# Patient Record
Sex: Male | Born: 1935 | Race: White | Hispanic: No | Marital: Married | Smoking: Never smoker
Health system: Southern US, Community
[De-identification: ages and names within clinical notes are randomized; demographics above are authoritative.]

## PROBLEM LIST (undated history)

## (undated) ENCOUNTER — Emergency Department (HOSPITAL_COMMUNITY): Discharge status: Against Medical Advice | Payer: Self-pay

## (undated) DIAGNOSIS — I700 Atherosclerosis of aorta: Secondary | ICD-10-CM

## (undated) DIAGNOSIS — T4145XA Adverse effect of unspecified anesthetic, initial encounter: Secondary | ICD-10-CM

## (undated) DIAGNOSIS — G8929 Other chronic pain: Secondary | ICD-10-CM

## (undated) DIAGNOSIS — R351 Nocturia: Secondary | ICD-10-CM

## (undated) DIAGNOSIS — I129 Hypertensive chronic kidney disease with stage 1 through stage 4 chronic kidney disease, or unspecified chronic kidney disease: Secondary | ICD-10-CM

## (undated) DIAGNOSIS — K219 Gastro-esophageal reflux disease without esophagitis: Secondary | ICD-10-CM

## (undated) DIAGNOSIS — I8390 Asymptomatic varicose veins of unspecified lower extremity: Secondary | ICD-10-CM

## (undated) DIAGNOSIS — D6859 Other primary thrombophilia: Secondary | ICD-10-CM

## (undated) DIAGNOSIS — M1990 Unspecified osteoarthritis, unspecified site: Secondary | ICD-10-CM

## (undated) DIAGNOSIS — D509 Iron deficiency anemia, unspecified: Secondary | ICD-10-CM

## (undated) DIAGNOSIS — K429 Umbilical hernia without obstruction or gangrene: Secondary | ICD-10-CM

## (undated) DIAGNOSIS — I10 Essential (primary) hypertension: Secondary | ICD-10-CM

## (undated) DIAGNOSIS — M51369 Other intervertebral disc degeneration, lumbar region without mention of lumbar back pain or lower extremity pain: Secondary | ICD-10-CM

## (undated) DIAGNOSIS — R112 Nausea with vomiting, unspecified: Secondary | ICD-10-CM

## (undated) DIAGNOSIS — I4891 Unspecified atrial fibrillation: Secondary | ICD-10-CM

## (undated) DIAGNOSIS — N400 Enlarged prostate without lower urinary tract symptoms: Secondary | ICD-10-CM

## (undated) DIAGNOSIS — R350 Frequency of micturition: Secondary | ICD-10-CM

## (undated) DIAGNOSIS — T7840XA Allergy, unspecified, initial encounter: Secondary | ICD-10-CM

## (undated) DIAGNOSIS — G5602 Carpal tunnel syndrome, left upper limb: Secondary | ICD-10-CM

## (undated) DIAGNOSIS — M5136 Other intervertebral disc degeneration, lumbar region: Secondary | ICD-10-CM

## (undated) DIAGNOSIS — M170 Bilateral primary osteoarthritis of knee: Secondary | ICD-10-CM

## (undated) DIAGNOSIS — R972 Elevated prostate specific antigen [PSA]: Secondary | ICD-10-CM

## (undated) DIAGNOSIS — M48061 Spinal stenosis, lumbar region without neurogenic claudication: Secondary | ICD-10-CM

## (undated) DIAGNOSIS — K4020 Bilateral inguinal hernia, without obstruction or gangrene, not specified as recurrent: Secondary | ICD-10-CM

## (undated) DIAGNOSIS — N401 Enlarged prostate with lower urinary tract symptoms: Secondary | ICD-10-CM

## (undated) DIAGNOSIS — E785 Hyperlipidemia, unspecified: Secondary | ICD-10-CM

## (undated) DIAGNOSIS — C801 Malignant (primary) neoplasm, unspecified: Secondary | ICD-10-CM

## (undated) DIAGNOSIS — M19049 Primary osteoarthritis, unspecified hand: Secondary | ICD-10-CM

## (undated) DIAGNOSIS — N529 Male erectile dysfunction, unspecified: Secondary | ICD-10-CM

## (undated) DIAGNOSIS — T8859XA Other complications of anesthesia, initial encounter: Secondary | ICD-10-CM

## (undated) DIAGNOSIS — N183 Chronic kidney disease, stage 3 unspecified: Secondary | ICD-10-CM

## (undated) DIAGNOSIS — Z9889 Other specified postprocedural states: Secondary | ICD-10-CM

## (undated) DIAGNOSIS — M1611 Unilateral primary osteoarthritis, right hip: Secondary | ICD-10-CM

## (undated) DIAGNOSIS — E119 Type 2 diabetes mellitus without complications: Secondary | ICD-10-CM

## (undated) DIAGNOSIS — N138 Other obstructive and reflux uropathy: Secondary | ICD-10-CM

## (undated) DIAGNOSIS — E1122 Type 2 diabetes mellitus with diabetic chronic kidney disease: Secondary | ICD-10-CM

## (undated) DIAGNOSIS — Z95818 Presence of other cardiac implants and grafts: Secondary | ICD-10-CM

## (undated) HISTORY — PX: TONSILLECTOMY: SUR1361

## (undated) HISTORY — DX: Other intervertebral disc degeneration, lumbar region without mention of lumbar back pain or lower extremity pain: M51.369

## (undated) HISTORY — DX: Allergy, unspecified, initial encounter: T78.40XA

## (undated) HISTORY — DX: Unspecified atrial fibrillation: I48.91

## (undated) HISTORY — DX: Essential (primary) hypertension: I10

## (undated) HISTORY — DX: Unilateral primary osteoarthritis, right hip: M16.11

## (undated) HISTORY — DX: Chronic kidney disease, stage 3 unspecified: N18.30

## (undated) HISTORY — DX: Frequency of micturition: R35.0

## (undated) HISTORY — DX: Hypertensive chronic kidney disease with stage 1 through stage 4 chronic kidney disease, or unspecified chronic kidney disease: I12.9

## (undated) HISTORY — PX: HEMORRHOID SURGERY: SHX153

## (undated) HISTORY — PX: COLONOSCOPY: SHX174

## (undated) HISTORY — DX: Other intervertebral disc degeneration, lumbar region: M51.36

## (undated) HISTORY — DX: Male erectile dysfunction, unspecified: N52.9

## (undated) HISTORY — DX: Carpal tunnel syndrome, left upper limb: G56.02

## (undated) HISTORY — PX: JOINT REPLACEMENT: SHX530

## (undated) HISTORY — DX: Benign prostatic hyperplasia without lower urinary tract symptoms: N40.0

## (undated) HISTORY — PX: UPPER GI ENDOSCOPY: SHX6162

## (undated) HISTORY — DX: Nocturia: R35.1

## (undated) HISTORY — DX: Bilateral primary osteoarthritis of knee: M17.0

## (undated) HISTORY — DX: Type 2 diabetes mellitus with diabetic chronic kidney disease: E11.22

## (undated) HISTORY — DX: Presence of other cardiac implants and grafts: Z95.818

## (undated) HISTORY — DX: Primary osteoarthritis, unspecified hand: M19.049

## (undated) HISTORY — DX: Type 2 diabetes mellitus without complications: E11.9

---
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
## 1996-01-07 HISTORY — PX: ANKLE ARTHROSCOPY WITH OPEN REDUCTION INTERNAL FIXATION (ORIF): SHX5582

## 2004-01-07 HISTORY — PX: KNEE ARTHROSCOPY: SHX127

## 2004-03-08 ENCOUNTER — Ambulatory Visit: Payer: Self-pay | Admitting: Urology

## 2009-02-06 ENCOUNTER — Ambulatory Visit: Payer: Self-pay | Admitting: Gastroenterology

## 2009-02-27 ENCOUNTER — Ambulatory Visit: Payer: Self-pay | Admitting: Gastroenterology

## 2009-03-22 ENCOUNTER — Ambulatory Visit: Payer: Self-pay | Admitting: Gastroenterology

## 2009-04-16 LAB — HM COLONOSCOPY

## 2009-08-20 ENCOUNTER — Encounter: Payer: Self-pay | Admitting: Cardiovascular Disease

## 2009-08-24 ENCOUNTER — Ambulatory Visit: Payer: Self-pay | Admitting: Cardiovascular Disease

## 2009-08-24 DIAGNOSIS — R609 Edema, unspecified: Secondary | ICD-10-CM | POA: Insufficient documentation

## 2009-10-19 ENCOUNTER — Telehealth: Payer: Self-pay | Admitting: Cardiovascular Disease

## 2009-10-19 ENCOUNTER — Emergency Department: Payer: Self-pay | Admitting: Emergency Medicine

## 2010-01-06 HISTORY — PX: UMBILICAL HERNIA REPAIR: SHX196

## 2010-01-06 HISTORY — PX: HERNIA REPAIR: SHX51

## 2010-01-06 HISTORY — PX: INGUINAL HERNIA REPAIR: SUR1180

## 2010-02-05 NOTE — Letter (Signed)
Summary: Imprimis  Imprimis   Imported By: Harlon Flor 08/22/2009 14:37:20  _____________________________________________________________________  External Attachment:    Type:   Image     Comment:   External Document

## 2010-02-05 NOTE — Assessment & Plan Note (Signed)
Summary: NP6/AMD   Visit Type:  Initial Consult Referring Peace Noyes:  Dr. Achilles Dunk Primary Phoua Hoadley:  Barry Brunner, M.D.  CC:  c/o ankles swelling after a 31 day bus trip. He has noticed since stopped the amlodipine  x 4 days has no swelling in ankles..  History of Present Illness: 76 year old gentleman, patient of Dr. Beckey Downing, recently seen and referred by Dr. Achilles Dunk, with a history of hypertension, presents for evaluation of lower extremity edema.  He reports that he has been feeling well. He has some polyuria at night time. He has recently repleted a 30 day bus trip to [place]. Prior to this he started noting lower extreme edema. The edema got worse through the bus trip. Since his return home, he has tried a low-dose diuretic with mild improvement. More recently, he held his amlodipine over the past 4 days with significant improvement of his edema. He reports that his blood pressure has been running in the low 140s though he has checked it sparingly.  He is active, walks one and 1/2 miles daily with no chest pain, shortness of breath. He has been on a cholesterol medicine and aspirin for quite some time.  EKG shows normal sinus rhythm with rate of 78 beats per minute, no significant ST or T wave changes.  Current Medications (verified): 1)  Cialis 20 Mg Tabs (Tadalafil) .... One Tablet Once Daily 2)  Avodart 0.5 Mg Caps (Dutasteride) .... One Tablet Once Daily 3)  Prilosec 20 Mg Cpdr (Omeprazole) .... One Tablet Once Daily 4)  Aspirin 81 Mg Tbec (Aspirin) .... One Tablet Once Daily 5)  Centrum Silver  Tabs (Multiple Vitamins-Minerals) .... One Tablet Once Daily 6)  Simvastatin 40 Mg Tabs (Simvastatin) .... One Tablet At Bedtime 7)  Benazepril Hcl 20 Mg Tabs (Benazepril Hcl) .... One Tablet Once Daily 8)  Aleve 220 Mg Tabs (Naproxen Sodium) .... 2-4  Tablets Once Daily  Allergies (verified): 1)  ! 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  Drug Use - no Caffeine in moderation   Risk Factors: Smoking Status: never (08/24/2009)  Review of Systems       The patient complains of peripheral edema.  The patient denies fever, weight loss, weight gain, vision loss, decreased hearing, hoarseness, chest pain, syncope, dyspnea on exertion, prolonged cough, abdominal pain, incontinence, muscle weakness, depression, and enlarged lymph nodes.         Edema has essentially resolved in past 4 days  Vital Signs:  Patient profile:   76 year old male Height:      74 inches Weight:      218 pounds BMI:     28.09 Pulse rate:   80 / minute BP sitting:   166 / 93  (left arm) Cuff size:   regular  Vitals Entered By: Bishop Dublin, CMA (August 24, 2009 11:22 AM)  Physical Exam  General:  Well developed, well nourished, in no acute distress. Head:  normocephalic and atraumatic Neck:  Neck supple, no JVD. No masses, thyromegaly or abnormal cervical nodes. Lungs:  Clear bilaterally to auscultation and percussion. Heart:  Non-displaced PMI, chest non-tender; regular rate and rhythm, S1, S2 without murmurs, rubs or gallops. Carotid upstroke normal, no bruit. Normal abdominal aortic size, no bruits. Pedals normal pulses. Trace edema  of the right LE, no varicosities. Abdomen:   abdomen soft and non-tender without masses Msk:  Back normal, normal gait. Muscle strength and tone normal. Pulses:  pulses normal in all 4 extremities Extremities:  No clubbing or cyanosis. Neurologic:  Alert and oriented x 3.   Impression & Recommendations:  Problem # 1:  EDEMA (ICD-782.3) edema has resolved after discontinuation of his calcium channel blocker, amlodipine. We have suggested that he monitor his blood pressure. If his systolic  pressure maintains greater than 140, systolic pressure more than 90, We have suggested that he retry amlodipine 5 mg daily. If he has recurrent edema, we will change this to an alternate medication such as HCTZ. I've asked him to call Dr. Georgian Co or myself if needed for blood pressure management.  I have suggested to him that if he has symptoms of shortness of breath, chest pressure, lethargy or change in his ability to exert himself, that he contact me for further evaluation.  Problem # 2:  PREVENTIVE HEALTH CARE (ICD-V70.0) He is on aspirin, statin, nonsmoker, borderline diabetes who exercises frequently. No other lifestyle management issues are needed apart from continued exercise and a  small amount of weight loss.  Other Orders: EKG w/ Interpretation (93000)  Patient Instructions: 1)  Your physician recommends that you schedule a follow-up appointment in: as needed  2)  Your physician has requested that you regularly monitor and record your blood pressure readings at home.  Please use the same machine at the same time of day to check your readings and record them to bring to your follow-up visit.

## 2010-02-05 NOTE — Letter (Signed)
Summary: PHI  PHI   Imported By: Harlon Flor 08/27/2009 09:40:37  _____________________________________________________________________  External Attachment:    Type:   Image     Comment:   External Document

## 2010-02-05 NOTE — Progress Notes (Signed)
Summary: Blood Pressure  Phone Note Call from Patient Call back at Home Phone [phone]   Caller: Spouse Call For: [place] Summary of Call: Patient's wife called and his blood pressure had been running very high for him today, 183/102.  His wife stated, "he just doesn't feel right" and has been disoriented.  Would like call back asap Initial call taken by: [place],  October 19, 2009 12:58 PM  Follow-up for Phone Call        Notified patient at 1:13 to see what is going on.  Patient took BP again at 1:13 reading of 184/104 pulse 87.   He then tells me he took another Benazepril tablet 30 minutes ago and now BP 179/98. He still feels disoriented and has headache.  I suggested since he lives in [place] to have someone drive him to the [facility] urgent care.  He states will do so now. Follow-up by: Bishop Dublin, CMA,  October 19, 2009 1:24 PM     Appended Document: Blood Pressure Can we call to see how his BP is running and what urgent care did for him/  Appended Document: Blood Pressure Pt sent to ER from urgent care.  Ran some tests and sent him home. Pt f/u with Dr. Letta Kocher who started him on losartan hctz 100/25 and increased his benazapril 40mg  daily. Today his BP was 147/91. Per Dr. Letta Kocher if pt's Bp continues to be elevated he is going to f/u with Dr. Mariah Milling. Pt recently went on a cruise and ER told him that his HTN maybe due to his scopolamine use, he reported wearing 4-5 patches at one time.

## 2010-03-26 ENCOUNTER — Ambulatory Visit: Payer: Self-pay | Admitting: Surgery

## 2010-04-01 ENCOUNTER — Ambulatory Visit: Payer: Self-pay | Admitting: Surgery

## 2010-07-15 ENCOUNTER — Encounter: Payer: Self-pay | Admitting: Cardiovascular Disease

## 2011-01-07 HISTORY — PX: THUMB ARTHROSCOPY: SHX2509

## 2011-01-28 ENCOUNTER — Ambulatory Visit: Payer: Self-pay | Admitting: Unknown Physician Specialty

## 2011-01-28 LAB — CBC
HCT: 42.8 % (ref 40.0–52.0)
HGB: 14.8 g/dL (ref 13.0–18.0)
MCH: 33.2 pg (ref 26.0–34.0)
MCHC: 34.5 g/dL (ref 32.0–36.0)
MCV: 96 fL (ref 80–100)
Platelet: 188 10*3/uL (ref 150–440)
RBC: 4.46 10*6/uL (ref 4.40–5.90)
WBC: 6.3 10*3/uL (ref 3.8–10.6)

## 2011-01-28 LAB — URINALYSIS, COMPLETE
Bacteria: NONE SEEN
Bilirubin,UR: NEGATIVE
Blood: NEGATIVE
Glucose,UR: NEGATIVE mg/dL (ref 0–75)
Ketone: NEGATIVE
Leukocyte Esterase: NEGATIVE
Ph: 5 (ref 4.5–8.0)
Protein: NEGATIVE
RBC,UR: 1 /HPF (ref 0–5)
Squamous Epithelial: NONE SEEN

## 2011-01-28 LAB — POTASSIUM: Potassium: 4.2 mmol/L (ref 3.5–5.1)

## 2011-02-03 ENCOUNTER — Ambulatory Visit: Payer: Self-pay | Admitting: Unknown Physician Specialty

## 2011-03-26 ENCOUNTER — Ambulatory Visit: Payer: Self-pay | Admitting: Unknown Physician Specialty

## 2011-09-02 ENCOUNTER — Emergency Department: Payer: Self-pay | Admitting: *Deleted

## 2011-09-11 DIAGNOSIS — N401 Enlarged prostate with lower urinary tract symptoms: Secondary | ICD-10-CM | POA: Insufficient documentation

## 2011-09-11 DIAGNOSIS — N138 Other obstructive and reflux uropathy: Secondary | ICD-10-CM | POA: Insufficient documentation

## 2012-09-15 DIAGNOSIS — E1122 Type 2 diabetes mellitus with diabetic chronic kidney disease: Secondary | ICD-10-CM | POA: Insufficient documentation

## 2012-09-15 DIAGNOSIS — I129 Hypertensive chronic kidney disease with stage 1 through stage 4 chronic kidney disease, or unspecified chronic kidney disease: Secondary | ICD-10-CM | POA: Insufficient documentation

## 2013-06-14 ENCOUNTER — Ambulatory Visit: Payer: Self-pay | Admitting: Internal Medicine

## 2013-06-23 ENCOUNTER — Ambulatory Visit: Payer: Self-pay | Admitting: Internal Medicine

## 2013-07-06 ENCOUNTER — Ambulatory Visit: Payer: Self-pay | Admitting: Internal Medicine

## 2013-07-22 ENCOUNTER — Ambulatory Visit: Payer: Self-pay | Admitting: Family Medicine

## 2013-08-06 ENCOUNTER — Ambulatory Visit: Payer: Self-pay | Admitting: Internal Medicine

## 2013-09-06 ENCOUNTER — Ambulatory Visit: Payer: Self-pay | Admitting: Internal Medicine

## 2013-11-03 DIAGNOSIS — M170 Bilateral primary osteoarthritis of knee: Secondary | ICD-10-CM | POA: Insufficient documentation

## 2014-02-27 LAB — LIPID PANEL
Cholesterol: 188 mg/dL (ref 0–200)
HDL: 28 mg/dL — AB (ref 35–70)
TRIGLYCERIDES: 440 mg/dL — AB (ref 40–160)

## 2014-02-27 LAB — HEMOGLOBIN A1C: Hgb A1c MFr Bld: 6.7 % — AB (ref 4.0–6.0)

## 2014-04-17 ENCOUNTER — Encounter: Payer: Self-pay | Admitting: Internal Medicine

## 2014-04-17 DIAGNOSIS — I8390 Asymptomatic varicose veins of unspecified lower extremity: Secondary | ICD-10-CM | POA: Insufficient documentation

## 2014-04-17 DIAGNOSIS — Z79899 Other long term (current) drug therapy: Secondary | ICD-10-CM | POA: Insufficient documentation

## 2014-04-17 DIAGNOSIS — N183 Chronic kidney disease, stage 3 unspecified: Secondary | ICD-10-CM | POA: Insufficient documentation

## 2014-04-17 DIAGNOSIS — M79672 Pain in left foot: Secondary | ICD-10-CM

## 2014-04-17 DIAGNOSIS — E119 Type 2 diabetes mellitus without complications: Secondary | ICD-10-CM | POA: Insufficient documentation

## 2014-04-17 DIAGNOSIS — E1169 Type 2 diabetes mellitus with other specified complication: Secondary | ICD-10-CM | POA: Insufficient documentation

## 2014-04-17 DIAGNOSIS — I10 Essential (primary) hypertension: Secondary | ICD-10-CM | POA: Insufficient documentation

## 2014-04-17 DIAGNOSIS — N529 Male erectile dysfunction, unspecified: Secondary | ICD-10-CM | POA: Insufficient documentation

## 2014-04-17 DIAGNOSIS — N401 Enlarged prostate with lower urinary tract symptoms: Secondary | ICD-10-CM | POA: Insufficient documentation

## 2014-04-17 DIAGNOSIS — N62 Hypertrophy of breast: Secondary | ICD-10-CM | POA: Insufficient documentation

## 2014-04-17 DIAGNOSIS — M62830 Muscle spasm of back: Secondary | ICD-10-CM | POA: Insufficient documentation

## 2014-04-17 DIAGNOSIS — R972 Elevated prostate specific antigen [PSA]: Secondary | ICD-10-CM | POA: Insufficient documentation

## 2014-04-17 DIAGNOSIS — K210 Gastro-esophageal reflux disease with esophagitis: Secondary | ICD-10-CM

## 2014-04-17 DIAGNOSIS — M19049 Primary osteoarthritis, unspecified hand: Secondary | ICD-10-CM | POA: Insufficient documentation

## 2014-04-17 DIAGNOSIS — E1122 Type 2 diabetes mellitus with diabetic chronic kidney disease: Secondary | ICD-10-CM | POA: Insufficient documentation

## 2014-04-17 DIAGNOSIS — E785 Hyperlipidemia, unspecified: Secondary | ICD-10-CM

## 2014-04-17 HISTORY — DX: Pain in left foot: M79.672

## 2014-07-28 ENCOUNTER — Encounter: Payer: Self-pay | Admitting: Internal Medicine

## 2014-07-28 ENCOUNTER — Ambulatory Visit (INDEPENDENT_AMBULATORY_CARE_PROVIDER_SITE_OTHER): Payer: Medicare Other | Admitting: Internal Medicine

## 2014-07-28 VITALS — BP 110/70 | HR 56 | Ht 74.0 in | Wt 198.6 lb

## 2014-07-28 DIAGNOSIS — Z0000 Encounter for general adult medical examination without abnormal findings: Secondary | ICD-10-CM

## 2014-07-28 DIAGNOSIS — E1121 Type 2 diabetes mellitus with diabetic nephropathy: Secondary | ICD-10-CM

## 2014-07-28 DIAGNOSIS — I10 Essential (primary) hypertension: Secondary | ICD-10-CM | POA: Diagnosis not present

## 2014-07-28 DIAGNOSIS — N183 Chronic kidney disease, stage 3 unspecified: Secondary | ICD-10-CM

## 2014-07-28 DIAGNOSIS — K2100 Gastro-esophageal reflux disease with esophagitis, without bleeding: Secondary | ICD-10-CM

## 2014-07-28 DIAGNOSIS — E785 Hyperlipidemia, unspecified: Secondary | ICD-10-CM

## 2014-07-28 LAB — POCT URINALYSIS DIPSTICK
Bilirubin, UA: NEGATIVE
GLUCOSE UA: NEGATIVE
Ketones, UA: NEGATIVE
Leukocytes, UA: NEGATIVE
Nitrite, UA: NEGATIVE
PROTEIN UA: NEGATIVE
RBC UA: NEGATIVE
Spec Grav, UA: 1.015
Urobilinogen, UA: 0.2
pH, UA: 5

## 2014-07-28 NOTE — Progress Notes (Signed)
Patient: Maxwell Stanley, Male    DOB: [birth year], 80 y.o.   MRN: [id number] Visit Date: 07/28/2014  Today's Provider: Halina Maidens, MD   Chief Complaint  Patient presents with  . Medicare Wellness  . Hypertension  . Hyperlipidemia   Subjective:    Annual wellness visit BOHDI LEEDS is a 80 y.o. male who presents today for his Subsequent Annual Wellness Visit. He feels fairly well. He reports exercising daily. He reports he is sleeping well. He is still dealing with elevated PSA and some LUTS - taking medication daily and followed by Dr. Jacqlyn Larsen - Urology.  ----------------------------------------------------------- Diabetes He presents for his follow-up diabetic visit. He has type 2 diabetes mellitus. His disease course has been stable. He is compliant with treatment all of the time. His breakfast blood glucose is taken between 7-8 am. His breakfast blood glucose range is generally 130-140 mg/dl.  Hypertension This is a chronic problem. The current episode started more than 1 year ago. The problem is unchanged. The problem is controlled. Risk factors for coronary artery disease include diabetes mellitus and dyslipidemia. Past treatments include beta blockers and diuretics. Identifiable causes of hypertension include chronic renal disease.  Hyperlipidemia This is a chronic problem. The problem is controlled. Recent lipid tests were reviewed and are normal. Exacerbating diseases include chronic renal disease and diabetes. Current antihyperlipidemic treatment includes statins.    Review of Systems  History   Social History  . Marital Status: Married    Spouse Name: N/A  . Number of Children: N/A  . Years of Education: N/A   Occupational History  . Not on file.   Social History Main Topics  . Smoking status: Never Smoker   . Smokeless tobacco: Not on file  . Alcohol Use: No  . Drug Use: No  . Sexual Activity: Not on file   Other Topics Concern  . Not on file    Social History Narrative   Caffeine in moderation    Patient Active Problem List   Diagnosis Date Noted  . Abnormal prostate specific antigen 04/17/2014  . Enlarged prostate with lower urinary tract symptoms (LUTS) 04/17/2014  . Impaired renal function 04/17/2014  . Dyslipidemia 04/17/2014  . 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 Essential (primary) hypertension 04/17/2014  . Esophagitis, reflux 04/17/2014  . Polypharmacy 04/17/2014  . Back muscle spasm 04/17/2014  . Degenerative arthritis of finger 04/17/2014  . Foot pain 04/17/2014  . Breast development in males 04/17/2014  . Diabetes mellitus, type 2 04/17/2014  . 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 Arthritis of knee, degenerative 11/03/2013  . Chronic kidney disease (CKD), stage III (moderate) 09/15/2012  . Benign prostatic hyperplasia with urinary obstruction 09/11/2011  . EDEMA 08/24/2009    Past Surgical History  Procedure Laterality Date  . Ankle arthroscopy with open reduction internal fixation (orif) Left 1998  . Hernia repair Left 2703    umbilical and left inguinal  . Knee arthroscopy Right 2006  . Thumb arthroscopy Right 2013    tendon flap to joint    His family history includes Coronary artery disease in his father; Diabetes in his mother; Hyperlipidemia in his father; Hypertension in his father.    Previous Medications   ACETAMINOPHEN (RA ACETAMINOPHEN) 650 MG CR TABLET    Take 2 tablets by mouth daily at 2 PM daily at 2 PM.   ASPIRIN 81 MG TABLET    Take 81 mg by mouth daily.     ATENOLOL (TENORMIN) 50 MG  TABLET    Take 1 tablet by mouth daily.   CHOLECALCIFEROL (VITAMIN D) 1000 UNITS TABLET    Take 1 tablet by mouth daily.   DOCUSATE SODIUM (COLACE) 100 MG CAPSULE    Take by mouth.   GLUCOSE BLOOD TEST STRIP    1 each 2 (two) times daily.   LOSARTAN-HYDROCHLOROTHIAZIDE (HYZAAR) 100-25 MG PER TABLET    Take 1 tablet by mouth daily.   MISC NATURAL PRODUCTS (OSTEO BI-FLEX TRIPLE STRENGTH)  TABS    Take 2 tablets by mouth daily.   MULTIPLE VITAMINS-MINERALS (CENTRUM SILVER) TABLET    Take 1 tablet by mouth daily.     OMEGA-3 FATTY ACIDS (FISH OIL) 1000 MG CAPS    Take 1 capsule by mouth daily at 2 PM daily at 2 PM.   PANTOPRAZOLE (PROTONIX) 40 MG TABLET    Take 1 tablet by mouth daily.   PROBIOTIC PRODUCT (PROBIOTIC & ACIDOPHILUS EX ST) CAPS    Take 1 capsule by mouth daily at 2 PM daily at 2 PM.   ROSUVASTATIN (CRESTOR) 40 MG TABLET    Take 1 tablet by mouth at bedtime.   SITAGLIPTIN (JANUVIA) 100 MG TABLET    Take 1 tablet by mouth daily.   TADALAFIL (CIALIS) 20 MG TABLET    Take 20 mg by mouth daily.     TAMSULOSIN (FLOMAX) 0.4 MG CAPS CAPSULE    Take 1 capsule by mouth daily at 2 PM daily at 2 PM.    Patient Care Team: Glean Hess, MD as PCP - General (Family Medicine)     Objective:   Vitals: BP 110/70 mmHg  Pulse 56  Ht 6\' 2"  (1.88 m)  Wt 198 lb 9.6 oz (90.084 kg)  BMI 25.49 kg/m2  Physical Exam  Activities of Daily Living In your present state of health, do you have any difficulty performing the following activities: 07/28/2014  Hearing? N  Vision? N  Difficulty concentrating or making decisions? N  Walking or climbing stairs? N  Dressing or bathing? N  Doing errands, shopping? N    Fall Risk Assessment Fall Risk  07/28/2014  Falls in the past year? No     Patient reports there are safety devices in place in shower at home.   Depression Screen PHQ 2/9 Scores 07/28/2014  PHQ - 2 Score 0    Cognitive Testing - 6-CIT   Correct? Score   What year is it? yes 0 Yes = 0    No = 4  What month is it? yes 0 Yes = 0    No = 3  Remember:     [place]ia [place], [place], [place]     What time is it? yes 0 Yes = 0    No = 3  Count backwards from 20 to 1 yes 0 Correct = 0    1 error = 2   More than 1 error = 4  Say the months of the year in reverse. yes 0 Correct = 0    1 error = 2   More than 1 error = 4  What address did I ask you to remember? yes 0  Correct = 0  1 error = 2    2 error = 4    3 error = 6    4 error = 8    All wrong = 10       TOTAL SCORE  0/28   Interpretation:  Normal  Normal (0-7)  Abnormal (8-28)        Assessment & Plan:     Annual Wellness Visit  Reviewed patient's Family Medical History Reviewed and updated list of patient's medical providers Assessment of cognitive impairment was done Assessed patient's functional ability Established a written schedule for health screening [place] Completed and Reviewed  Exercise Activities and Dietary recommendations Goals    None      Immunization History  Administered Date(s) Administered  . Pneumococcal Conjugate-13 10/26/2013  . Pneumococcal Polysaccharide-23 01/31/2011  . Tdap 10/03/2010  . Zoster 04/16/2000    Health Maintenance  Topic Date Due  . FOOT EXAM  10/13/1944  . OPHTHALMOLOGY EXAM  10/13/1946  . URINE MICROALBUMIN  10/13/1946  . INFLUENZA VACCINE  08/07/2014  . HEMOGLOBIN A1C  08/28/2014  . COLONOSCOPY  04/17/2019  . TETANUS/TDAP  10/02/2020  . ZOSTAVAX  Completed  . PNA vac Low Risk Adult  Completed     Discussed health benefits of physical activity, and encouraged him to engage in regular exercise appropriate for his age and condition.    ------------------------------------------------------------------------------------------------------------  1. Annual physical exam Continue follow up for PSA with Urology  2. Dyslipidemia On statin therapy - Lipid panel  3. Chronic kidney disease (CKD), stage III (moderate) Avoid regular nsaid use - Comprehensive metabolic panel  4. Type 2 diabetes mellitus with diabetic nephropathy Continue current medication - Microalbumin / creatinine urine ratio - Hemoglobin A1c  5. Esophagitis, reflux Stable on PPI - CBC with Differential/Platelet  6. Essential (primary) hypertension Running low - cut losartan to 1/2 tablet daily Move atenolol dose to the PM -  Comprehensive metabolic panel - POCT urinalysis dipstick - TSH   Halina Maidens, MD [facility]  07/28/2014

## 2014-07-28 NOTE — Patient Instructions (Addendum)
Health Maintenance  Topic Date Due  . FOOT EXAM  10/13/1946  . OPHTHALMOLOGY EXAM  10/13/1946  . URINE MICROALBUMIN  10/13/1946  . INFLUENZA VACCINE  08/07/2014  . HEMOGLOBIN A1C  08/28/2014  . COLONOSCOPY  04/17/2019  . TETANUS/TDAP  10/02/2020  . ZOSTAVAX  Completed  . PNA vac Low Risk Adult  Completed    **Cut losartan-hct in half to take 50 mg per day in the morning** **Take atenolol in the evening**

## 2014-07-29 LAB — CBC WITH DIFFERENTIAL/PLATELET
Basophils Absolute: 0 10*3/uL (ref 0.0–0.2)
Basos: 0 %
EOS (ABSOLUTE): 0.4 10*3/uL (ref 0.0–0.4)
EOS: 6 %
Hematocrit: 42.2 % (ref 37.5–51.0)
Hemoglobin: 14.3 g/dL (ref 12.6–17.7)
IMMATURE GRANS (ABS): 0 10*3/uL (ref 0.0–0.1)
Immature Granulocytes: 0 %
Lymphocytes Absolute: 2.2 10*3/uL (ref 0.7–3.1)
Lymphs: 33 %
MCH: 32.1 pg (ref 26.6–33.0)
MCHC: 33.9 g/dL (ref 31.5–35.7)
MCV: 95 fL (ref 79–97)
MONOS ABS: 0.5 10*3/uL (ref 0.1–0.9)
Monocytes: 8 %
Neutrophils Absolute: 3.6 10*3/uL (ref 1.4–7.0)
Neutrophils: 53 %
Platelets: 206 10*3/uL (ref 150–379)
RBC: 4.45 x10E6/uL (ref 4.14–5.80)
RDW: 13.6 % (ref 12.3–15.4)
WBC: 6.8 10*3/uL (ref 3.4–10.8)

## 2014-07-29 LAB — LIPID PANEL
CHOL/HDL RATIO: 5.2 ratio — AB (ref 0.0–5.0)
CHOLESTEROL TOTAL: 156 mg/dL (ref 100–199)
HDL: 30 mg/dL — AB (ref >39)
LDL Calculated: 78 mg/dL (ref 0–99)
Triglycerides: 242 mg/dL — ABNORMAL HIGH (ref 0–149)
VLDL Cholesterol Cal: 48 mg/dL — ABNORMAL HIGH (ref 5–40)

## 2014-07-29 LAB — COMPREHENSIVE METABOLIC PANEL
ALBUMIN: 4.3 g/dL (ref 3.5–4.8)
ALT: 21 IU/L (ref 0–44)
AST: 21 IU/L (ref 0–40)
Albumin/Globulin Ratio: 1.7 (ref 1.1–2.5)
Alkaline Phosphatase: 79 IU/L (ref 39–117)
BILIRUBIN TOTAL: 0.6 mg/dL (ref 0.0–1.2)
BUN/Creatinine Ratio: 23 — ABNORMAL HIGH (ref 10–22)
BUN: 34 mg/dL — ABNORMAL HIGH (ref 8–27)
CHLORIDE: 101 mmol/L (ref 97–108)
CO2: 24 mmol/L (ref 18–29)
CREATININE: 1.51 mg/dL — AB (ref 0.76–1.27)
Calcium: 9.5 mg/dL (ref 8.6–10.2)
GFR calc non Af Amer: 44 mL/min/{1.73_m2} — ABNORMAL LOW (ref >59)
GFR, EST AFRICAN AMERICAN: 51 mL/min/{1.73_m2} — AB (ref >59)
GLUCOSE: 146 mg/dL — AB (ref 65–99)
Globulin, Total: 2.5 g/dL (ref 1.5–4.5)
Potassium: 4.4 mmol/L (ref 3.5–5.2)
Sodium: 142 mmol/L (ref 134–144)
TOTAL PROTEIN: 6.8 g/dL (ref 6.0–8.5)

## 2014-07-29 LAB — MICROALBUMIN / CREATININE URINE RATIO
Creatinine, Urine: 171 mg/dL
MICROALB/CREAT RATIO: 2 mg/g creat (ref 0.0–30.0)
Microalbumin, Urine: 3.5 ug/mL

## 2014-07-29 LAB — HEMOGLOBIN A1C
ESTIMATED AVERAGE GLUCOSE: 143 mg/dL
Hgb A1c MFr Bld: 6.6 % — ABNORMAL HIGH (ref 4.8–5.6)

## 2014-07-29 LAB — TSH: TSH: 2.51 u[IU]/mL (ref 0.450–4.500)

## 2014-08-29 ENCOUNTER — Other Ambulatory Visit: Payer: Self-pay | Admitting: Unknown Physician Specialty

## 2014-08-29 DIAGNOSIS — M545 Low back pain, unspecified: Secondary | ICD-10-CM

## 2014-09-07 ENCOUNTER — Ambulatory Visit
Admission: RE | Admit: 2014-09-07 | Discharge: 2014-09-07 | Disposition: A | Discharge status: Home / Self Care | Payer: Medicare Other | Source: Ambulatory Visit | Attending: Unknown Physician Specialty | Admitting: Unknown Physician Specialty

## 2014-09-07 DIAGNOSIS — M4806 Spinal stenosis, lumbar region: Secondary | ICD-10-CM | POA: Diagnosis not present

## 2014-09-07 DIAGNOSIS — M47896 Other spondylosis, lumbar region: Secondary | ICD-10-CM | POA: Diagnosis not present

## 2014-09-07 DIAGNOSIS — M545 Low back pain, unspecified: Secondary | ICD-10-CM

## 2014-09-13 ENCOUNTER — Ambulatory Visit: Payer: Self-pay

## 2014-09-21 DIAGNOSIS — M48061 Spinal stenosis, lumbar region without neurogenic claudication: Secondary | ICD-10-CM | POA: Insufficient documentation

## 2014-09-21 DIAGNOSIS — M545 Low back pain: Secondary | ICD-10-CM

## 2014-09-21 DIAGNOSIS — G8929 Other chronic pain: Secondary | ICD-10-CM | POA: Insufficient documentation

## 2014-09-26 ENCOUNTER — Ambulatory Visit (INDEPENDENT_AMBULATORY_CARE_PROVIDER_SITE_OTHER): Payer: Medicare Other

## 2014-09-26 DIAGNOSIS — Z23 Encounter for immunization: Secondary | ICD-10-CM

## 2014-11-28 ENCOUNTER — Encounter: Payer: Self-pay | Admitting: Internal Medicine

## 2014-11-28 ENCOUNTER — Other Ambulatory Visit: Payer: Self-pay

## 2014-11-28 ENCOUNTER — Ambulatory Visit (INDEPENDENT_AMBULATORY_CARE_PROVIDER_SITE_OTHER): Payer: Medicare Other | Admitting: Internal Medicine

## 2014-11-28 VITALS — BP 124/62 | HR 60 | Ht 74.0 in | Wt 202.0 lb

## 2014-11-28 DIAGNOSIS — E118 Type 2 diabetes mellitus with unspecified complications: Secondary | ICD-10-CM | POA: Insufficient documentation

## 2014-11-28 DIAGNOSIS — N183 Chronic kidney disease, stage 3 unspecified: Secondary | ICD-10-CM

## 2014-11-28 DIAGNOSIS — E1121 Type 2 diabetes mellitus with diabetic nephropathy: Secondary | ICD-10-CM | POA: Diagnosis not present

## 2014-11-28 DIAGNOSIS — I10 Essential (primary) hypertension: Secondary | ICD-10-CM | POA: Diagnosis not present

## 2014-11-28 DIAGNOSIS — R109 Unspecified abdominal pain: Secondary | ICD-10-CM

## 2014-11-28 NOTE — Progress Notes (Signed)
Date:  11/28/2014   Name:  Maxwell Stanley   DOB:  Apr 05, 1934   MRN:  [id number]   Chief Complaint: Diabetes and Hypertension Diabetes He presents for his follow-up diabetic visit. He has type 2 diabetes mellitus. His disease course has been stable. Pertinent negatives for hypoglycemia include no dizziness or headaches. Pertinent negatives for diabetes include no chest pain and no fatigue. Current diabetic treatment includes oral agent (monotherapy) (januvia 50 mg). He is compliant with treatment most of the time. His breakfast blood glucose is taken between 7-8 am. His breakfast blood glucose range is generally 140-180 mg/dl. His dinner blood glucose is taken between 7-8 pm. His dinner blood glucose range is generally 140-180 mg/dl.  Hypertension This is a chronic problem. The current episode started more than 1 year ago. The problem is unchanged. The problem is controlled. Pertinent negatives include no chest pain, headaches, palpitations or shortness of breath. Past treatments include angiotensin blockers and diuretics.  Abdominal discomfort - He notes discomfort in the early am after waking.  It feels like sore muscles and only last for a few hours.  He can do whatever he wants for the rest of the day - lifting mowers and doing yardwork. He is taken no medications for the discomfort. He denies shortness of breath chest heaviness or fatigue. Renal insuff - patient has mild chronic renal insufficiency that has been stable. He avoids nonsteroidals. He denies any edema, itching, or other systemic symptoms.  Review of Systems  Constitutional: Negative for fever, chills and fatigue.  HENT: Negative for tinnitus and trouble swallowing.   Eyes: Negative for visual disturbance.  Respiratory: Negative for cough, choking, chest tightness and shortness of breath.   Cardiovascular: Negative for chest pain, palpitations and leg swelling.  Gastrointestinal: Negative for diarrhea, constipation, blood in  stool and abdominal distention.  Genitourinary: Negative for difficulty urinating.  Musculoskeletal: Positive for arthralgias. Negative for joint swelling and gait problem.  Neurological: Negative for dizziness and headaches.  Hematological: Negative for adenopathy.  Psychiatric/Behavioral: Negative for sleep disturbance.    Patient Active Problem List   Diagnosis Date Noted  . Type 2 diabetes mellitus with diabetic nephropathy, without long-term current use of insulin ([place]) 11/28/2014  . LBP (low back pain) 09/21/2014  . Lumbar canal stenosis 09/21/2014  . Abnormal prostate specific antigen 04/17/2014  . Enlarged prostate with lower urinary tract symptoms (LUTS) 04/17/2014  . Impaired renal function 04/17/2014  . Dyslipidemia 04/17/2014  . 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 Essential (primary) hypertension 04/17/2014  . Esophagitis, reflux 04/17/2014  . Polypharmacy 04/17/2014  . Back muscle spasm 04/17/2014  . Degenerative arthritis of finger 04/17/2014  . Foot pain 04/17/2014  . Breast development in males 04/17/2014  . 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 Arthritis of knee, degenerative 11/03/2013  . Chronic kidney disease (CKD), stage III (moderate) 09/15/2012  . Benign prostatic hyperplasia with urinary obstruction 09/11/2011  . EDEMA 08/24/2009    Prior to Admission medications   Medication Sig Start Date End Date Taking? Authorizing Provider  acetaminophen (RA ACETAMINOPHEN) 650 MG CR tablet Take 2 tablets by mouth daily at 2 PM daily at 2 PM.   Yes Historical Provider, MD  aspirin 81 MG tablet Take 81 mg by mouth daily.     Yes Historical Provider, MD  atenolol (TENORMIN) 50 MG tablet Take 1 tablet by mouth at bedtime. 01/09/14  Yes Historical Provider, MD  cholecalciferol (VITAMIN D) 1000 UNITS tablet Take 1 tablet by mouth  daily.   Yes Historical Provider, MD  docusate sodium (COLACE) 100 MG capsule Take by mouth.   Yes Historical Provider, MD  glucose  blood test strip 1 each 2 (two) times daily. 06/24/13  Yes Historical Provider, MD  losartan-hydrochlorothiazide (HYZAAR) 100-25 MG per tablet Take 0.5 tablets by mouth daily. 01/09/14  Yes Historical Provider, MD  Misc Natural Products (OSTEO BI-FLEX TRIPLE STRENGTH) TABS Take 2 tablets by mouth daily.   Yes Historical Provider, MD  Multiple Vitamins-Minerals (CENTRUM SILVER) tablet Take 1 tablet by mouth daily.     Yes Historical Provider, MD  nystatin (MYCOSTATIN) powder Apply topically. 11/03/14 11/03/15 Yes Historical Provider, MD  Omega-3 Fatty Acids (FISH OIL) 1000 MG CAPS Take 1 capsule by mouth daily at 2 PM daily at 2 PM.   Yes Historical Provider, MD  pantoprazole (PROTONIX) 40 MG tablet Take 1 tablet by mouth daily. 01/09/14  Yes Historical Provider, MD  Probiotic Product (PROBIOTIC & ACIDOPHILUS EX ST) CAPS Take 1 capsule by mouth daily at 2 PM daily at 2 PM.   Yes Historical Provider, MD  rosuvastatin (CRESTOR) 40 MG tablet Take 1 tablet by mouth at bedtime. 01/10/14  Yes Historical Provider, MD  sitaGLIPtin (JANUVIA) 100 MG tablet Take 1 tablet by mouth daily. 01/10/14  Yes Historical Provider, MD  tadalafil (CIALIS) 20 MG tablet Take 20 mg by mouth daily.     Yes Historical Provider, MD  tamsulosin (FLOMAX) 0.4 MG CAPS capsule Take 1 capsule by mouth daily at 2 PM daily at 2 PM.    Historical Provider, MD    Allergies  Allergen Reactions  . Amlodipine     Other reaction(s): Edema  . Codeine   . Penicillin G Rash  . Rocephin  [Ceftriaxone] Rash    Past Surgical History  Procedure Laterality Date  . Ankle arthroscopy with open reduction internal fixation (orif) Left 1998  . Hernia repair Left 0000000    umbilical and left inguinal  . Knee arthroscopy Right 2006  . Thumb arthroscopy Right 2013    tendon flap to joint    Social History  Substance Use Topics  . Smoking status: Never Smoker   . Smokeless tobacco: None  . Alcohol Use: No    Medication list has been reviewed and  updated.   Physical Exam  Constitutional: He is oriented to person, place, and time. He appears well-developed. No distress.  HENT:  Head: Normocephalic and atraumatic.  Eyes: Right eye exhibits no discharge. Left eye exhibits no discharge. No scleral icterus.  Neck: Normal range of motion. Neck supple. No thyromegaly present.  Cardiovascular: Normal rate, regular rhythm and normal heart sounds.   Pulmonary/Chest: Effort normal and breath sounds normal. No respiratory distress.  Abdominal: Soft. Normal appearance and bowel sounds are normal. He exhibits no distension. There is no hepatosplenomegaly. There is no tenderness. There is no rebound, no guarding and no CVA tenderness.  Musculoskeletal: Normal range of motion. He exhibits no edema.  Neurological: He is alert and oriented to person, place, and time.  Skin: Skin is warm and dry. Laceration noted. No rash noted.  Three small lacerations - forehead, wrist and lower leg  Psychiatric: He has a normal mood and affect. His behavior is normal. Thought content normal.    BP 124/62 mmHg  Pulse 60  Ht 6\' 2"  (1.88 m)  Wt 202 lb (91.627 kg)  BMI 25.92 kg/m2  Assessment and Plan: 1. Type 2 diabetes mellitus with diabetic nephropathy, without long-term current use  of insulin ([place]) Doing well on oral agents will adjust dose if needed - Hemoglobin A1c  2. Essential (primary) hypertension Controlled  3. Chronic kidney disease (CKD), stage III (moderate) Continue to monitor and avoid nonsteroidals - Comprehensive metabolic panel  4. Abdominal discomfort Appears to be abdominal muscle discomfort possibly due to sleep position or inappropriate mattress Return if needed for worsening symptoms  Halina Maidens, MD [facility]  11/28/2014

## 2014-11-29 LAB — COMPREHENSIVE METABOLIC PANEL
ALBUMIN: 4.2 g/dL (ref 3.5–4.8)
ALK PHOS: 70 IU/L (ref 39–117)
ALT: 20 IU/L (ref 0–44)
AST: 24 IU/L (ref 0–40)
Albumin/Globulin Ratio: 1.8 (ref 1.1–2.5)
BUN / CREAT RATIO: 19 (ref 10–22)
BUN: 22 mg/dL (ref 8–27)
Bilirubin Total: 0.6 mg/dL (ref 0.0–1.2)
CO2: 26 mmol/L (ref 18–29)
Calcium: 9.6 mg/dL (ref 8.6–10.2)
Chloride: 101 mmol/L (ref 97–106)
Creatinine, Ser: 1.13 mg/dL (ref 0.76–1.27)
GFR calc Af Amer: 72 mL/min/{1.73_m2} (ref >59)
GFR calc non Af Amer: 62 mL/min/{1.73_m2} (ref >59)
GLOBULIN, TOTAL: 2.4 g/dL (ref 1.5–4.5)
GLUCOSE: 146 mg/dL — AB (ref 65–99)
Potassium: 4.3 mmol/L (ref 3.5–5.2)
SODIUM: 142 mmol/L (ref 136–144)
Total Protein: 6.6 g/dL (ref 6.0–8.5)

## 2014-11-29 LAB — HEMOGLOBIN A1C
Est. average glucose Bld gHb Est-mCnc: 146 mg/dL
HEMOGLOBIN A1C: 6.7 % — AB (ref 4.8–5.6)

## 2014-12-29 ENCOUNTER — Other Ambulatory Visit: Payer: Self-pay | Admitting: Internal Medicine

## 2014-12-29 ENCOUNTER — Encounter: Payer: Self-pay | Admitting: Internal Medicine

## 2014-12-29 ENCOUNTER — Ambulatory Visit
Admission: RE | Admit: 2014-12-29 | Discharge: 2014-12-29 | Disposition: A | Discharge status: Home / Self Care | Payer: Medicare Other | Source: Ambulatory Visit | Attending: Internal Medicine | Admitting: Internal Medicine

## 2014-12-29 ENCOUNTER — Ambulatory Visit (INDEPENDENT_AMBULATORY_CARE_PROVIDER_SITE_OTHER): Payer: Medicare Other | Admitting: Internal Medicine

## 2014-12-29 VITALS — BP 140/80 | HR 72 | Ht 74.0 in | Wt 204.0 lb

## 2014-12-29 DIAGNOSIS — R0781 Pleurodynia: Secondary | ICD-10-CM | POA: Diagnosis not present

## 2014-12-29 MED ORDER — BACLOFEN 10 MG PO TABS: 10.0000 mg | ORAL_TABLET | Freq: Every day | ORAL | Status: DC

## 2014-12-29 NOTE — Progress Notes (Signed)
Date:  12/29/2014   Name:  Maxwell Stanley   DOB:  [birth year]   MRN:  [id number]   Chief Complaint: Back Pain Back Pain This is a chronic problem. The current episode started more than 1 month ago. The problem occurs daily. The problem is unchanged. The quality of the pain is described as stabbing. The pain does not radiate. The pain is mild. The symptoms are aggravated by bending and twisting. Stiffness is present in the morning. Pertinent negatives include no chest pain, fever, numbness or weakness.  He was seen a month ago and felt to have muscle spasm.  He has had several massages and taken muscle relaxants with minimal improvement.  Now he has discomfort to deep breathing.  There is no point tenderness, fever, cough or shortness of breath.    Review of Systems  Constitutional: Negative for fever, diaphoresis and fatigue.  Respiratory: Negative for choking, chest tightness and shortness of breath.   Cardiovascular: Negative for chest pain and palpitations.  Musculoskeletal: Positive for back pain.  Neurological: Negative for weakness and numbness.    Patient Active Problem List   Diagnosis Date Noted  . Type 2 diabetes mellitus with diabetic nephropathy, without long-term current use of insulin ([place]) 11/28/2014  . LBP (low back pain) 09/21/2014  . Lumbar canal stenosis 09/21/2014  . Abnormal prostate specific antigen 04/17/2014  . Enlarged prostate with lower urinary tract symptoms (LUTS) 04/17/2014  . Impaired renal function 04/17/2014  . Dyslipidemia 04/17/2014  . 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 Essential (primary) hypertension 04/17/2014  . Esophagitis, reflux 04/17/2014  . Polypharmacy 04/17/2014  . Back muscle spasm 04/17/2014  . Degenerative arthritis of finger 04/17/2014  . Foot pain 04/17/2014  . Breast development in males 04/17/2014  . 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 Arthritis of knee, degenerative 11/03/2013  . Chronic kidney disease  (CKD), stage III (moderate) 09/15/2012  . Benign prostatic hyperplasia with urinary obstruction 09/11/2011  . EDEMA 08/24/2009    Prior to Admission medications   Medication Sig Start Date End Date Taking? Authorizing Provider  acetaminophen (RA ACETAMINOPHEN) 650 MG CR tablet Take 2 tablets by mouth daily at 2 PM daily at 2 PM.   Yes Historical Provider, MD  aspirin 81 MG tablet Take 81 mg by mouth daily.     Yes Historical Provider, MD  atenolol (TENORMIN) 50 MG tablet Take 1 tablet by mouth at bedtime. 01/09/14  Yes Historical Provider, MD  cholecalciferol (VITAMIN D) 1000 UNITS tablet Take 1 tablet by mouth daily.   Yes Historical Provider, MD  docusate sodium (COLACE) 100 MG capsule Take by mouth.   Yes Historical Provider, MD  glucose blood test strip 1 each 2 (two) times daily. 06/24/13  Yes Historical Provider, MD  losartan-hydrochlorothiazide (HYZAAR) 100-25 MG per tablet Take 0.5 tablets by mouth daily. 01/09/14  Yes Historical Provider, MD  Misc Natural Products (OSTEO BI-FLEX TRIPLE STRENGTH) TABS Take 2 tablets by mouth daily.   Yes Historical Provider, MD  Multiple Vitamins-Minerals (CENTRUM SILVER) tablet Take 1 tablet by mouth daily.     Yes Historical Provider, MD  nystatin (MYCOSTATIN) powder Apply topically. Reported on 12/29/2014 11/03/14 11/03/15 Yes Historical Provider, MD  Omega-3 Fatty Acids (FISH OIL) 1000 MG CAPS Take 1 capsule by mouth daily at 2 PM. Reported on 12/29/2014   Yes Historical Provider, MD  pantoprazole (PROTONIX) 40 MG tablet Take 1 tablet by mouth daily. 01/09/14  Yes Historical Provider, MD  Probiotic Product (  PROBIOTIC & ACIDOPHILUS EX ST) CAPS Take 1 capsule by mouth daily at 2 PM daily at 2 PM.   Yes Historical Provider, MD  rosuvastatin (CRESTOR) 40 MG tablet Take 1 tablet by mouth at bedtime. 01/10/14  Yes Historical Provider, MD  sitaGLIPtin (JANUVIA) 100 MG tablet Take 1 tablet by mouth daily. 01/10/14  Yes Historical Provider, MD  tadalafil (CIALIS) 20 MG  tablet Take 20 mg by mouth daily.     Yes Historical Provider, MD  tamsulosin (FLOMAX) 0.4 MG CAPS capsule Take 1 capsule by mouth daily at 2 PM daily at 2 PM.   Yes Historical Provider, MD    Allergies  Allergen Reactions  . Amlodipine     Other reaction(s): Edema  . Codeine   . Penicillin G Rash  . Rocephin  [Ceftriaxone] Rash    Past Surgical History  Procedure Laterality Date  . Ankle arthroscopy with open reduction internal fixation (orif) Left 1998  . Hernia repair Left 0000000    umbilical and left inguinal  . Knee arthroscopy Right 2006  . Thumb arthroscopy Right 2013    tendon flap to joint    Social History  Substance Use Topics  . Smoking status: Never Smoker   . Smokeless tobacco: None  . Alcohol Use: No   Lab Results  Component Value Date   HGBA1C 6.7* 11/28/2014    Medication list has been reviewed and updated.   Physical Exam  Constitutional: He is oriented to person, place, and time. He appears well-developed. No distress.  HENT:  Head: Normocephalic and atraumatic.  Eyes: Conjunctivae are normal. Right eye exhibits no discharge. Left eye exhibits no discharge. No scleral icterus.  Cardiovascular: Normal rate, regular rhythm and normal heart sounds.   Pulmonary/Chest: Effort normal and breath sounds normal. No respiratory distress. He has no wheezes.  Abdominal: Soft. Normal appearance. There is no tenderness.  Musculoskeletal: Normal range of motion.       Thoracic back: He exhibits no tenderness, no bony tenderness and no edema.       Back:  Neurological: He is alert and oriented to person, place, and time.  Skin: Skin is warm and dry. No rash noted.  Psychiatric: He has a normal mood and affect. His behavior is normal. Thought content normal.  Nursing note and vitals reviewed.   BP 140/80 mmHg  Pulse 72  Ht 6\' 2"  (1.88 m)  Wt 204 lb (92.534 kg)  BMI 26.18 kg/m2  Assessment and Plan: 1. Rib pain on left side Has not responded to muscle  relaxants Pt can not take nsaids due to RI Will get Xray to rule out pulmonary or bony process Consider prednisone taper - DG Chest 2 View; Future   Halina Maidens, MD [facility]  12/29/2014

## 2015-01-07 DIAGNOSIS — E119 Type 2 diabetes mellitus without complications: Secondary | ICD-10-CM

## 2015-01-07 HISTORY — DX: Type 2 diabetes mellitus without complications: E11.9

## 2015-01-09 DIAGNOSIS — D225 Melanocytic nevi of trunk: Secondary | ICD-10-CM | POA: Diagnosis not present

## 2015-01-09 DIAGNOSIS — C44712 Basal cell carcinoma of skin of right lower limb, including hip: Secondary | ICD-10-CM | POA: Diagnosis not present

## 2015-01-09 DIAGNOSIS — C44519 Basal cell carcinoma of skin of other part of trunk: Secondary | ICD-10-CM | POA: Diagnosis not present

## 2015-01-09 DIAGNOSIS — L570 Actinic keratosis: Secondary | ICD-10-CM | POA: Diagnosis not present

## 2015-01-09 DIAGNOSIS — Z1283 Encounter for screening for malignant neoplasm of skin: Secondary | ICD-10-CM | POA: Diagnosis not present

## 2015-01-09 DIAGNOSIS — D485 Neoplasm of uncertain behavior of skin: Secondary | ICD-10-CM | POA: Diagnosis not present

## 2015-01-09 DIAGNOSIS — L0101 Non-bullous impetigo: Secondary | ICD-10-CM | POA: Diagnosis not present

## 2015-01-12 ENCOUNTER — Other Ambulatory Visit: Payer: Self-pay | Admitting: Internal Medicine

## 2015-01-12 MED ORDER — ATENOLOL 50 MG PO TABS: 50.0000 mg | ORAL_TABLET | Freq: Every day | ORAL | Status: DC

## 2015-01-12 MED ORDER — SITAGLIPTIN PHOSPHATE 100 MG PO TABS: 100.0000 mg | ORAL_TABLET | Freq: Every day | ORAL | Status: DC

## 2015-01-12 MED ORDER — PANTOPRAZOLE SODIUM 40 MG PO TBEC: 40.0000 mg | DELAYED_RELEASE_TABLET | Freq: Every day | ORAL | Status: DC

## 2015-01-12 MED ORDER — ROSUVASTATIN CALCIUM 40 MG PO TABS: 40.0000 mg | ORAL_TABLET | Freq: Every day | ORAL | Status: DC

## 2015-01-12 MED ORDER — LOSARTAN POTASSIUM-HCTZ 100-25 MG PO TABS: 1.0000 | ORAL_TABLET | Freq: Every day | ORAL | Status: DC

## 2015-01-22 DIAGNOSIS — C44519 Basal cell carcinoma of skin of other part of trunk: Secondary | ICD-10-CM | POA: Diagnosis not present

## 2015-01-22 DIAGNOSIS — C44712 Basal cell carcinoma of skin of right lower limb, including hip: Secondary | ICD-10-CM | POA: Diagnosis not present

## 2015-01-29 DIAGNOSIS — C4441 Basal cell carcinoma of skin of scalp and neck: Secondary | ICD-10-CM | POA: Diagnosis not present

## 2015-02-19 DIAGNOSIS — C44329 Squamous cell carcinoma of skin of other parts of face: Secondary | ICD-10-CM | POA: Diagnosis not present

## 2015-02-19 DIAGNOSIS — C44320 Squamous cell carcinoma of skin of unspecified parts of face: Secondary | ICD-10-CM | POA: Diagnosis not present

## 2015-02-21 DIAGNOSIS — C4441 Basal cell carcinoma of skin of scalp and neck: Secondary | ICD-10-CM | POA: Diagnosis not present

## 2015-02-21 DIAGNOSIS — C44519 Basal cell carcinoma of skin of other part of trunk: Secondary | ICD-10-CM | POA: Diagnosis not present

## 2015-02-28 ENCOUNTER — Other Ambulatory Visit: Payer: Self-pay | Admitting: Internal Medicine

## 2015-02-28 MED ORDER — OSELTAMIVIR PHOSPHATE 75 MG PO CAPS: 75.0000 mg | ORAL_CAPSULE | Freq: Every day | ORAL | Status: DC

## 2015-03-05 DIAGNOSIS — C44712 Basal cell carcinoma of skin of right lower limb, including hip: Secondary | ICD-10-CM | POA: Diagnosis not present

## 2015-03-17 ENCOUNTER — Encounter: Payer: Self-pay | Admitting: Gynecology

## 2015-03-17 ENCOUNTER — Ambulatory Visit
Admission: EM | Admit: 2015-03-17 | Discharge: 2015-03-17 | Disposition: A | Discharge status: Home / Self Care | Payer: Medicare HMO | Attending: Family Medicine | Admitting: Family Medicine

## 2015-03-17 DIAGNOSIS — H6123 Impacted cerumen, bilateral: Secondary | ICD-10-CM

## 2015-03-17 DIAGNOSIS — B349 Viral infection, unspecified: Secondary | ICD-10-CM | POA: Diagnosis not present

## 2015-03-17 DIAGNOSIS — J988 Other specified respiratory disorders: Principal | ICD-10-CM

## 2015-03-17 DIAGNOSIS — B9789 Other viral agents as the cause of diseases classified elsewhere: Secondary | ICD-10-CM

## 2015-03-17 LAB — RAPID INFLUENZA A&B ANTIGENS (ARMC ONLY): INFLUENZA A (ARMC): NEGATIVE

## 2015-03-17 LAB — RAPID INFLUENZA A&B ANTIGENS: Influenza B (ARMC): NEGATIVE

## 2015-03-17 LAB — RAPID STREP SCREEN (MED CTR MEBANE ONLY): STREPTOCOCCUS, GROUP A SCREEN (DIRECT): NEGATIVE

## 2015-03-17 MED ORDER — SALINE SPRAY 0.65 % NA SOLN: 2.0000 | NASAL | Status: DC

## 2015-03-17 MED ORDER — IPRATROPIUM-ALBUTEROL 0.5-2.5 (3) MG/3ML IN SOLN
3.0000 mL | Freq: Once | RESPIRATORY_TRACT | Status: AC
  Administered 2015-03-17: 3 mL via RESPIRATORY_TRACT

## 2015-03-17 MED ORDER — FLUTICASONE PROPIONATE 50 MCG/ACT NA SUSP: 1.0000 | Freq: Two times a day (BID) | NASAL | Status: DC

## 2015-03-17 MED ORDER — PREDNISONE 10 MG PO TABS: 20.0000 mg | ORAL_TABLET | Freq: Every day | ORAL | Status: AC

## 2015-03-17 MED ORDER — ACETAMINOPHEN 500 MG PO TABS: 1000.0000 mg | ORAL_TABLET | Freq: Four times a day (QID) | ORAL | Status: AC | PRN

## 2015-03-17 MED ORDER — ALBUTEROL SULFATE HFA 108 (90 BASE) MCG/ACT IN AERS: 1.0000 | INHALATION_SPRAY | Freq: Four times a day (QID) | RESPIRATORY_TRACT | Status: DC | PRN

## 2015-03-17 NOTE — ED Provider Notes (Signed)
CSN: [id number]     Arrival date & time 03/17/15  1110 History   First MD Initiated Contact with Patient 03/17/15 1137     Chief Complaint  Patient presents with  . Sore Throat  . Generalized Body Aches   (Consider location/radiation/quality/duration/timing/severity/associated sxs/prior Treatment) HPI Comments: Married caucasian male here for evaluation of recurrent cough and new sore throat/chest discomfort with cough.  Took tamiflu from Dr [name] 28 Feb 2015 went on vacation drove to [place] x 1 week and just returned last night was feeling fine during drive.  T99.9 this am, body aches    Patient is a 81 y.o. male presenting with pharyngitis. The history is provided by the patient.  Sore Throat This is a new problem. The current episode started 6 to 12 hours ago. The problem occurs constantly. The problem has been gradually worsening. Associated symptoms include chest pain. Pertinent negatives include no abdominal pain, no headaches and no shortness of breath. The symptoms are aggravated by swallowing and coughing. Nothing relieves the symptoms. He has tried water, food and rest for the symptoms. The treatment provided no relief.    Past Medical History  Diagnosis Date  . Nocturia   . Organic impotence   . Urinary frequency   . BPH (benign prostatic hypertrophy)     with BOO   Past Surgical History  Procedure Laterality Date  . Ankle arthroscopy with open reduction internal fixation (orif) Left 1998  . Hernia repair Left 0000000    umbilical and left inguinal  . Knee arthroscopy Right 2006  . Thumb arthroscopy Right 2013    tendon flap to joint   Family History  Problem Relation Age of Onset  . Coronary artery disease Father   . Hypertension Father   . Hyperlipidemia Father   . Diabetes Mother    Social History  Substance Use Topics  . Smoking status: Never Smoker   . Smokeless tobacco: None  . Alcohol Use: No    Review of Systems  Constitutional: Positive for fever.  Negative for chills, diaphoresis, activity change, appetite change, fatigue and unexpected weight change.  HENT: Positive for sore throat. Negative for congestion, dental problem, drooling, ear discharge, ear pain, facial swelling, hearing loss, mouth sores, nosebleeds, postnasal drip, rhinorrhea, sinus pressure, sneezing, tinnitus, trouble swallowing and voice change.   Eyes: Negative for photophobia, pain, discharge, redness, itching and visual disturbance.  Respiratory: Positive for cough. Negative for choking, chest tightness, shortness of breath, wheezing and stridor.   Cardiovascular: Positive for chest pain. Negative for palpitations and leg swelling.  Gastrointestinal: Negative for nausea, vomiting, abdominal pain, diarrhea, constipation, blood in stool and abdominal distention.  Endocrine: Negative for cold intolerance and heat intolerance.  Genitourinary: Negative for dysuria.  Musculoskeletal: Positive for myalgias. Negative for back pain, joint swelling, arthralgias, gait problem, neck pain and neck stiffness.  Skin: Negative for color change, pallor, rash and wound.  Allergic/Immunologic: Positive for environmental allergies. Negative for food allergies and immunocompromised state.  Neurological: Negative for dizziness, tremors, seizures, syncope, facial asymmetry, speech difficulty, weakness, light-headedness, numbness and headaches.  Hematological: Negative for adenopathy. Does not bruise/bleed easily.  Psychiatric/Behavioral: Negative for behavioral problems, confusion, sleep disturbance and agitation.    Allergies  Amlodipine; Codeine; Penicillin g; and Rocephin   Home Medications   Prior to Admission medications   Medication Sig Start Date End Date Taking? Authorizing Provider  aspirin 81 MG tablet Take 81 mg by mouth daily.     Yes Historical Provider, MD  atenolol (TENORMIN) 50 MG tablet Take 1 tablet (50 mg total) by mouth at bedtime. 01/12/15  Yes Glean Hess, MD   baclofen (LIORESAL) 10 MG tablet Take 1 tablet (10 mg total) by mouth at bedtime. 12/29/14  Yes Glean Hess, MD  cholecalciferol (VITAMIN D) 1000 UNITS tablet Take 1 tablet by mouth daily.   Yes Historical Provider, MD  docusate sodium (COLACE) 100 MG capsule Take by mouth.   Yes Historical Provider, MD  glucose blood test strip 1 each 2 (two) times daily. 06/24/13  Yes Historical Provider, MD  losartan-hydrochlorothiazide (HYZAAR) 100-25 MG tablet Take 1 tablet by mouth daily. 01/12/15  Yes Glean Hess, MD  Misc Natural Products (OSTEO BI-FLEX TRIPLE STRENGTH) TABS Take 2 tablets by mouth daily.   Yes Historical Provider, MD  Multiple Vitamins-Minerals (CENTRUM SILVER) tablet Take 1 tablet by mouth daily.     Yes Historical Provider, MD  nystatin (MYCOSTATIN) powder Apply topically. Reported on 12/29/2014 11/03/14 11/03/15 Yes Historical Provider, MD  Omega-3 Fatty Acids (FISH OIL) 1000 MG CAPS Take 1 capsule by mouth daily at 2 PM. Reported on 12/29/2014   Yes Historical Provider, MD  pantoprazole (PROTONIX) 40 MG tablet Take 1 tablet (40 mg total) by mouth daily. 01/12/15  Yes Glean Hess, MD  Probiotic Product (PROBIOTIC & ACIDOPHILUS EX ST) CAPS Take 1 capsule by mouth daily at 2 PM daily at 2 PM.   Yes Historical Provider, MD  rosuvastatin (CRESTOR) 40 MG tablet Take 1 tablet (40 mg total) by mouth at bedtime. 01/12/15  Yes Glean Hess, MD  sitaGLIPtin (JANUVIA) 100 MG tablet Take 1 tablet (100 mg total) by mouth daily. 01/12/15  Yes Glean Hess, MD  tadalafil (CIALIS) 20 MG tablet Take 20 mg by mouth daily.     Yes Historical Provider, MD  acetaminophen (TYLENOL) 500 MG tablet Take 2 tablets (1,000 mg total) by mouth every 6 (six) hours as needed for mild pain, moderate pain, fever or headache. 03/17/15 03/21/15  Olen Cordial, NP  albuterol (PROVENTIL HFA;VENTOLIN HFA) 108 (90 Base) MCG/ACT inhaler Inhale 1-2 puffs into the lungs every 6 (six) hours as needed for wheezing  or shortness of breath. 03/17/15   Olen Cordial, NP  fluticasone (FLONASE) 50 MCG/ACT nasal spray Place 1 spray into both nostrils 2 (two) times daily. 03/17/15   Olen Cordial, NP  predniSONE (DELTASONE) 10 MG tablet Take 2 tablets (20 mg total) by mouth daily with breakfast. 03/18/15 03/20/15  Olen Cordial, NP  sodium chloride (OCEAN) 0.65 % SOLN nasal spray Place 2 sprays into both nostrils every 2 (two) hours while awake. 03/17/15 03/31/15  Olen Cordial, NP  tamsulosin (FLOMAX) 0.4 MG CAPS capsule Take 1 capsule by mouth daily at 2 PM daily at 2 PM.    Historical Provider, MD   Meds Ordered and Administered this Visit   Medications  ipratropium-albuterol (DUONEB) 0.5-2.5 (3) MG/3ML nebulizer solution 3 mL (3 mLs Nebulization Given 03/17/15 1211)    BP 121/62 mmHg  Pulse 88  Temp(Src) 99.5 F (37.5 C) (Oral)  Resp 16  Ht 6\' 2"  (1.88 m)  Wt 200 lb (90.719 kg)  BMI 25.67 kg/m2  SpO2 96% No data found.   Physical Exam  Constitutional: He is oriented to person, place, and time. Vital signs are normal. He appears well-developed and well-nourished. He is active and cooperative.  Non-toxic appearance. He does not have a sickly appearance. He appears ill. No distress.  HENT:  Head: Normocephalic and atraumatic.  Right Ear: Hearing, external ear and ear canal normal.  Left Ear: Hearing, external ear and ear canal normal.  Nose: Mucosal edema and rhinorrhea present. No nose lacerations, sinus tenderness, nasal deformity, septal deviation or nasal septal hematoma. No epistaxis.  No foreign bodies. Right sinus exhibits no maxillary sinus tenderness and no frontal sinus tenderness. Left sinus exhibits no maxillary sinus tenderness and no frontal sinus tenderness.  Mouth/Throat: Uvula is midline and mucous membranes are normal. Mucous membranes are not pale, not dry and not cyanotic. He does not have dentures. No oral lesions. No trismus in the jaw. Normal dentition. No dental  abscesses, uvula swelling, lacerations or dental caries. Posterior oropharyngeal edema and posterior oropharyngeal erythema present. No oropharyngeal exudate or tonsillar abscesses.  Oropharyngeal macular rash; cobblestoning posterior pharynx; bilateral nasal turbinates with edema/erythema/clear discharge; bilateral auditory canals with soft yellow wax occluding visualization of TMs  Eyes: Conjunctivae, EOM and lids are normal. Pupils are equal, round, and reactive to light. Right eye exhibits no chemosis, no discharge, no exudate and no hordeolum. No foreign body present in the right eye. Left eye exhibits no chemosis, no discharge, no exudate and no hordeolum. No foreign body present in the left eye. Right conjunctiva is not injected. Right conjunctiva has no hemorrhage. Left conjunctiva is not injected. Left conjunctiva has no hemorrhage. No scleral icterus. Right eye exhibits normal extraocular motion and no nystagmus. Left eye exhibits normal extraocular motion and no nystagmus. Right pupil is round and reactive. Left pupil is round and reactive. Pupils are equal.  Neck: Trachea normal and normal range of motion. Neck supple. No tracheal tenderness, no spinous process tenderness and no muscular tenderness present. No rigidity. No tracheal deviation, no edema, no erythema and normal range of motion present. No thyroid mass and no thyromegaly present.  Cardiovascular: Normal rate, regular rhythm, S1 normal, S2 normal, normal heart sounds and intact distal pulses.  PMI is not displaced.  Exam reveals no gallop and no friction rub.   No murmur heard. Pulmonary/Chest: Effort normal and breath sounds normal. No stridor. No respiratory distress. He has no decreased breath sounds. He has no wheezes. He has no rhonchi. He has no rales.  Abdominal: Soft. He exhibits no distension.  Musculoskeletal: Normal range of motion. He exhibits no edema or tenderness.       Right shoulder: Normal.       Left shoulder:  Normal.       Right elbow: Normal.      Left elbow: Normal.       Right hip: Normal.       Left hip: Normal.       Right knee: Normal.       Left knee: Normal.       Cervical back: Normal.       Thoracic back: Normal.       Lumbar back: Normal.       Right hand: Normal.       Left hand: Normal.  Lymphadenopathy:       Head (right side): No submental, no submandibular, no tonsillar, no preauricular, no posterior auricular and no occipital adenopathy present.       Head (left side): No submental, no submandibular, no tonsillar, no preauricular, no posterior auricular and no occipital adenopathy present.    He has no cervical adenopathy.       Right cervical: No superficial cervical, no deep cervical and no posterior cervical adenopathy present.  Left cervical: No superficial cervical, no deep cervical and no posterior cervical adenopathy present.  Neurological: He is alert and oriented to person, place, and time. He displays no atrophy and no tremor. No cranial nerve deficit or sensory deficit. He exhibits normal muscle tone. He displays no seizure activity. Coordination and gait normal. GCS eye subscore is 4. GCS verbal subscore is 5. GCS motor subscore is 6.  Skin: Skin is warm, dry and intact. No abrasion, no bruising, no burn, no ecchymosis, no laceration, no lesion, no petechiae and no rash noted. He is not diaphoretic. No cyanosis or erythema. No pallor. Nails show no clubbing.  Psychiatric: He has a normal mood and affect. His speech is normal and behavior is normal. Judgment and thought content normal. Cognition and memory are normal.  Nursing note and vitals reviewed.   ED Course  Procedures (including critical care time)  Labs Review Labs Reviewed  RAPID STREP SCREEN (NOT AT [facility])  RAPID INFLUENZA A&B ANTIGENS ([facility] ONLY)  CULTURE, GROUP A STREP [facility])  1220 Patient notified rapid flu and strep negative  Patient verbalized agreement and understanding of info  1240 BBS  CTA spo2 96% RA able to talk in complete sentences  Discussed prednisone taper patient wanted lower dose/shortest course possible as raises blood sugars over 300 per past experience unsure of dose prescribed per chart review prednisone taper by Dr [name] for back pain last year.  Imaging Review No results found.   MDM   1. Viral respiratory illness   2. Cerumen impaction, bilateral    Patient notified rapid strep negative and rapid flu negative.  Suspect Viral illness: no evidence of invasive bacterial infection, non toxic and well hydrated.  This is most likely self limiting viral infection.  I do not see where any further testing or imaging is necessary at this time.   I will suggest supportive care, rest, good hygiene and encourage the patient to take adequate fluids.  Does not require work excuse.  Notified patient staff will call with culture results once available next 48+ hours. Albuterol 1-2 puffs po q4-6h prn cough protracted/wheezing/chest tightness.  OTC cough suppressant po prn has delsym at home.  flonase 1 spray each nostril BID prn, nasal saline 1-2 sprays each nostril prn q2h, tylenol 1000mg  po QID prn.  Discussed honey with lemon and salt water gargles for comfort also.  The patient is to return to clinic or EMERGENCY ROOM if symptoms worsen or change significantly e.g. fever, lethargy, SOB, wheezing.  Follow up with PCM if worsening of symptoms this week.  Exitcare handout on viral illness given to patient.  Patient verbalized agreement and understanding of treatment plan.    when in clinic have provider check ears to see if buildup with this method again.  Discussed ear muff wear with loud noise exposure/yard work.  Discussed purpose of earwax with patient.  Avoid cotton applicator (Q-tip) use in ears.  exitcare handout on cerumen buildup given to patient.  Patient verbalized understanding, agreed with plan of care and had no further questions at this time.      Olen Cordial, NP 03/17/15 1248

## 2015-03-17 NOTE — ED Notes (Signed)
Patient stated x 2 weeks ago wife dx. With FLU. Per pt. His doctor also put him on tamiflu x 10 days. Pt. Now complain of low grade fever x last pm of 99.9 / sore throat and body ache.

## 2015-03-17 NOTE — Discharge Instructions (Signed)
Cerumen Impaction The structures of the external ear canal secrete a waxy substance known as cerumen. Excess cerumen can build up in the ear canal, causing a condition known as cerumen impaction. Cerumen impaction can cause ear pain and disrupt the function of the ear. The rate of cerumen production differs for each individual. In certain individuals, the configuration of the ear canal may decrease his or her ability to naturally remove cerumen. CAUSES Cerumen impaction is caused by excessive cerumen production or buildup. RISK FACTORS  Frequent use of swabs to clean ears.  Having narrow ear canals.  Having eczema.  Being dehydrated. SIGNS AND SYMPTOMS  Diminished hearing.  Ear drainage.  Ear pain.  Ear itch. TREATMENT Treatment may involve:  Over-the-counter or prescription ear drops to soften the cerumen.  Removal of cerumen by a health care provider. This may be done with:  Irrigation with warm water. This is the most common method of removal.  Ear curettes and other instruments.  Surgery. This may be done in severe cases. HOME CARE INSTRUCTIONS  Take medicines only as directed by your health care provider.  Do not insert objects into the ear with the intent of cleaning the ear. PREVENTION  Do not insert objects into the ear, even with the intent of cleaning the ear. Removing cerumen as a part of normal hygiene is not necessary, and the use of swabs in the ear canal is not recommended.  Drink enough water to keep your urine clear or pale yellow.  Control your eczema if you have it. SEEK MEDICAL CARE IF:  You develop ear pain.  You develop bleeding from the ear.  The cerumen does not clear after you use ear drops as directed.   This information is not intended to replace advice given to you by your health care provider. Make sure you discuss any questions you have with your health care provider.   Document Released: 01/31/2004 Document Revised: 01/13/2014  Document Reviewed: 08/09/2014 Elsevier Interactive Patient Education 2016 [employer] Viral Infections A virus is a type of germ. Viruses can cause:  Minor sore throats.  Aches and pains.  Headaches.  Runny nose.  Rashes.  Watery eyes.  Tiredness.  Coughs.  Loss of appetite.  Feeling sick to your stomach (nausea).  Throwing up (vomiting).  Watery poop (diarrhea). HOME CARE   Only take medicines as told by your doctor.  Drink enough water and fluids to keep your pee (urine) clear or pale yellow. Sports drinks are a good choice.  Get plenty of rest and eat healthy. Soups and broths with crackers or rice are fine. GET HELP RIGHT AWAY IF:   You have a very bad headache.  You have shortness of breath.  You have chest pain or neck pain.  You have an unusual rash.  You cannot stop throwing up.  You have watery poop that does not stop.  You cannot keep fluids down.  You or your child has a temperature by mouth above 102 F (38.9 C), not controlled by medicine.  Your baby is older than 3 months with a rectal temperature of 102 F (38.9 C) or higher.  Your baby is 35 months old or younger with a rectal temperature of 100.4 F (38 C) or higher. MAKE SURE YOU:   Understand these instructions.  Will watch this condition.  Will get help right away if you are not doing well or get worse.   This information is not intended to replace advice given to you  by your health care provider. Make sure you discuss any questions you have with your health care provider.   Document Released: 12/06/2007 Document Revised: 03/17/2011 Document Reviewed: 05/31/2014 Elsevier Interactive Patient Education 2016 Else[employer] Acute Bronchitis Bronchitis is inflammation of the airways that extend from the windpipe into the lungs (bronchi). The inflammation often causes mucus to develop. This leads to a cough, which is the most common symptom of bronchitis.  In acute bronchitis,  the condition usually develops suddenly and goes away over time, usually in a couple weeks. Smoking, allergies, and asthma can make bronchitis worse. Repeated episodes of bronchitis may cause further lung problems.  CAUSES Acute bronchitis is most often caused by the same virus that causes a cold. The virus can spread from person to person (contagious) through coughing, sneezing, and touching contaminated objects. SIGNS AND SYMPTOMS   Cough.   Fever.   Coughing up mucus.   Body aches.   Chest congestion.   Chills.   Shortness of breath.   Sore throat.  DIAGNOSIS  Acute bronchitis is usually diagnosed through a physical exam. Your health care provider will also ask you questions about your medical history. Tests, such as chest X-rays, are sometimes done to rule out other conditions.  TREATMENT  Acute bronchitis usually goes away in a couple weeks. Oftentimes, no medical treatment is necessary. Medicines are sometimes given for relief of fever or cough. Antibiotic medicines are usually not needed but may be prescribed in certain situations. In some cases, an inhaler may be recommended to help reduce shortness of breath and control the cough. A cool mist vaporizer may also be used to help thin bronchial secretions and make it easier to clear the chest.  HOME CARE INSTRUCTIONS  Get plenty of rest.   Drink enough fluids to keep your urine clear or pale yellow (unless you have a medical condition that requires fluid restriction). Increasing fluids may help thin your respiratory secretions (sputum) and reduce chest congestion, and it will prevent dehydration.   Take medicines only as directed by your health care provider.  If you were prescribed an antibiotic medicine, finish it all even if you start to feel better.  Avoid smoking and secondhand smoke. Exposure to cigarette smoke or irritating chemicals will make bronchitis worse. If you are a smoker, consider using nicotine gum  or skin patches to help control withdrawal symptoms. Quitting smoking will help your lungs heal faster.   Reduce the chances of another bout of acute bronchitis by washing your hands frequently, avoiding people with cold symptoms, and trying not to touch your hands to your mouth, nose, or eyes.   Keep all follow-up visits as directed by your health care provider.  SEEK MEDICAL CARE IF: Your symptoms do not improve after 1 week of treatment.  SEEK IMMEDIATE MEDICAL CARE IF:  You develop an increased fever or chills.   You have chest pain.   You have severe shortness of breath.  You have bloody sputum.   You develop dehydration.  You faint or repeatedly feel like you are going to pass out.  You develop repeated vomiting.  You develop a severe headache. MAKE SURE YOU:   Understand these instructions.  Will watch your condition.  Will get help right away if you are not doing well or get worse.   This information is not intended to replace advice given to you by your health care provider. Make sure you discuss any questions you have with your health care provider.  Document Released: 01/31/2004 Document Revised: 01/13/2014 Document Reviewed: 06/15/2012 Elsevier Interactive Patient Education [employer].

## 2015-03-19 ENCOUNTER — Telehealth: Payer: Self-pay | Admitting: Internal Medicine

## 2015-03-19 LAB — CULTURE, GROUP A STREP (THRC)

## 2015-03-19 NOTE — Telephone Encounter (Signed)
Telephone message left for patient throat culture normal/negative.  Appears to be viral illness.  Patient to contact clinic and ask to speak with a nurse if further questions or concerns

## 2015-03-26 ENCOUNTER — Ambulatory Visit (INDEPENDENT_AMBULATORY_CARE_PROVIDER_SITE_OTHER): Payer: Medicare HMO | Admitting: Internal Medicine

## 2015-03-26 ENCOUNTER — Encounter: Payer: Self-pay | Admitting: Internal Medicine

## 2015-03-26 VITALS — BP 116/62 | HR 72 | Temp 98.4°F | Ht 74.0 in | Wt 202.0 lb

## 2015-03-26 DIAGNOSIS — J40 Bronchitis, not specified as acute or chronic: Secondary | ICD-10-CM

## 2015-03-26 MED ORDER — DOXYCYCLINE HYCLATE 100 MG PO TABS: 100.0000 mg | ORAL_TABLET | Freq: Two times a day (BID) | ORAL | Status: DC

## 2015-03-26 NOTE — Progress Notes (Signed)
Date:  03/26/2015   Name:  Maxwell Stanley   DOB:  [birth year]   MRN:  [id number]   Chief Complaint: Cough Cough This is a new problem. The current episode started in the past 7 days. The problem has been unchanged. The problem occurs every few minutes. The cough is productive of sputum. Associated symptoms include chills, a fever and shortness of breath. Pertinent negatives include no chest pain, headaches, heartburn, myalgias or wheezing. Nothing aggravates the symptoms. He has tried a beta-agonist inhaler for the symptoms. The treatment provided mild relief.  He was seen in UC 10 days ago and given a prednisone taper and MDI.  He felt improved but then started to have more chest congestions and shortness of breath.    Review of Systems  Constitutional: Positive for fever and chills.  Eyes: Negative for visual disturbance.  Respiratory: Positive for cough and shortness of breath. Negative for chest tightness and wheezing.   Cardiovascular: Negative for chest pain and palpitations.  Gastrointestinal: Negative for heartburn, vomiting and diarrhea.  Musculoskeletal: Negative for myalgias.  Neurological: Negative for syncope, light-headedness and headaches.    Patient Active Problem List   Diagnosis Date Noted  . Type 2 diabetes mellitus with diabetic nephropathy, without long-term current use of insulin ([place]) 11/28/2014  . LBP (low back pain) 09/21/2014  . Lumbar canal stenosis 09/21/2014  . Abnormal prostate specific antigen 04/17/2014  . Enlarged prostate with lower urinary tract symptoms (LUTS) 04/17/2014  . Impaired renal function 04/17/2014  . Dyslipidemia 04/17/2014  . 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 Essential (primary) hypertension 04/17/2014  . Esophagitis, reflux 04/17/2014  . Polypharmacy 04/17/2014  . Back muscle spasm 04/17/2014  . Degenerative arthritis of finger 04/17/2014  . Foot pain 04/17/2014  . Breast development in males  04/17/2014  . 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 Arthritis of knee, degenerative 11/03/2013  . Chronic kidney disease (CKD), stage III (moderate) 09/15/2012  . Benign prostatic hyperplasia with urinary obstruction 09/11/2011  . EDEMA 08/24/2009    Prior to Admission medications   Medication Sig Start Date End Date Taking? Authorizing Provider  albuterol (PROVENTIL HFA;VENTOLIN HFA) 108 (90 Base) MCG/ACT inhaler Inhale 1-2 puffs into the lungs every 6 (six) hours as needed for wheezing or shortness of breath. 03/17/15  Yes Olen Cordial, NP  aspirin 81 MG tablet Take 81 mg by mouth daily.     Yes Historical Provider, MD  atenolol (TENORMIN) 50 MG tablet Take 1 tablet (50 mg total) by mouth at bedtime. 01/12/15  Yes Glean Hess, MD  cholecalciferol (VITAMIN D) 1000 UNITS tablet Take 1 tablet by mouth daily.   Yes Historical Provider, MD  docusate sodium (COLACE) 100 MG capsule Take by mouth.   Yes Historical Provider, MD  fluticasone (FLONASE) 50 MCG/ACT nasal spray Place 1 spray into both nostrils 2 (two) times daily. 03/17/15  Yes Tina A Betancourt, NP  glucose blood test strip 1 each 2 (two) times daily. 06/24/13  Yes Historical Provider, MD  losartan-hydrochlorothiazide (HYZAAR) 100-25 MG tablet Take 1 tablet by mouth daily. 01/12/15  Yes Glean Hess, MD  Misc Natural Products (OSTEO BI-FLEX TRIPLE STRENGTH) TABS Take 2 tablets by mouth daily.   Yes Historical Provider, MD  Multiple Vitamins-Minerals (CENTRUM SILVER) tablet Take 1 tablet by mouth daily.     Yes Historical Provider, MD  Omega-3 Fatty Acids (FISH OIL) 1000 MG CAPS Take 1 capsule by mouth daily at 2 PM. Reported on  12/29/2014   Yes Historical Provider, MD  pantoprazole (PROTONIX) 40 MG tablet Take 1 tablet (40 mg total) by mouth daily. 01/12/15  Yes Glean Hess, MD  Probiotic Product (PROBIOTIC & ACIDOPHILUS EX ST) CAPS Take 1 capsule by mouth daily at 2 PM daily at 2 PM.   Yes Historical Provider, MD  rosuvastatin (CRESTOR)  40 MG tablet Take 1 tablet (40 mg total) by mouth at bedtime. 01/12/15  Yes Glean Hess, MD  sitaGLIPtin (JANUVIA) 100 MG tablet Take 1 tablet (100 mg total) by mouth daily. 01/12/15  Yes Glean Hess, MD  tadalafil (CIALIS) 20 MG tablet Take 20 mg by mouth daily.     Yes Historical Provider, MD  tamsulosin (FLOMAX) 0.4 MG CAPS capsule Take 1 capsule by mouth daily at 2 PM daily at 2 PM.   Yes Historical Provider, MD  baclofen (LIORESAL) 10 MG tablet Take 1 tablet (10 mg total) by mouth at bedtime. Patient not taking: Reported on 03/26/2015 12/29/14   Glean Hess, MD    Allergies  Allergen Reactions  . Amlodipine     Other reaction(s): Edema  . Codeine   . Penicillin G Rash  . Rocephin  [Ceftriaxone] Rash    Past Surgical History  Procedure Laterality Date  . Ankle arthroscopy with open reduction internal fixation (orif) Left 1998  . Hernia repair Left 0000000    umbilical and left inguinal  . Knee arthroscopy Right 2006  . Thumb arthroscopy Right 2013    tendon flap to joint    Social History  Substance Use Topics  . Smoking status: Never Smoker   . Smokeless tobacco: None  . Alcohol Use: No     Medication list has been reviewed and updated.   Physical Exam  Constitutional: He is oriented to person, place, and time. He appears well-developed. No distress.  HENT:  Head: Normocephalic and atraumatic.  Cardiovascular: Normal rate, regular rhythm and normal heart sounds.   Pulmonary/Chest: Effort normal. No respiratory distress. He has decreased breath sounds. He has no wheezes.  Musculoskeletal: Normal range of motion.  Lymphadenopathy:    He has no cervical adenopathy.  Neurological: He is alert and oriented to person, place, and time.  Skin: Skin is warm and dry. No rash noted.  Psychiatric: He has a normal mood and affect. His behavior is normal. Thought content normal.    BP 116/62 mmHg  Pulse 72  Temp(Src) 98.4 F (36.9 C) (Oral)  Ht 6\' 2"  (1.88 m)   Wt 202 lb (91.627 kg)  BMI 25.92 kg/m2  SpO2 92%  Assessment and Plan: 1. Bronchitis Following viral URI Continue albuterol 2 puffs 3-4 times per day  Delsym or other OTC cough syrup if needed - doxycycline (VIBRA-TABS) 100 MG tablet; Take 1 tablet (100 mg total) by mouth 2 (two) times daily.  Dispense: 20 tablet; Refill: 0   Halina Maidens, MD [facility]  03/26/2015

## 2015-03-26 NOTE — Patient Instructions (Signed)

## 2015-04-09 ENCOUNTER — Encounter: Payer: Self-pay | Admitting: Internal Medicine

## 2015-04-09 ENCOUNTER — Ambulatory Visit (INDEPENDENT_AMBULATORY_CARE_PROVIDER_SITE_OTHER): Payer: Medicare HMO | Admitting: Internal Medicine

## 2015-04-09 VITALS — BP 120/62 | HR 74 | Temp 98.4°F | Ht 74.0 in | Wt 201.0 lb

## 2015-04-09 DIAGNOSIS — E1121 Type 2 diabetes mellitus with diabetic nephropathy: Secondary | ICD-10-CM | POA: Diagnosis not present

## 2015-04-09 DIAGNOSIS — J40 Bronchitis, not specified as acute or chronic: Secondary | ICD-10-CM

## 2015-04-09 DIAGNOSIS — I10 Essential (primary) hypertension: Secondary | ICD-10-CM

## 2015-04-09 DIAGNOSIS — J320 Chronic maxillary sinusitis: Secondary | ICD-10-CM | POA: Insufficient documentation

## 2015-04-09 NOTE — Progress Notes (Signed)
Date:  04/09/2015   Name:  Maxwell Stanley   DOB:  30-May-1934   MRN:  [id number]   Chief Complaint: Follow-up; Bronchitis; and Diabetes Diabetes He presents for his follow-up diabetic visit. He has type 2 diabetes mellitus. His disease course has been stable. There are no hypoglycemic associated symptoms. Pertinent negatives for hypoglycemia include no dizziness, headaches or tremors. Pertinent negatives for diabetes include no blurred vision, no chest pain and no fatigue. Current diabetic treatment includes oral agent (monotherapy). He is compliant with treatment all of the time. An ACE inhibitor/angiotensin II receptor blocker is being taken.  Hypertension This is a chronic problem. The current episode started more than 1 year ago. The problem is unchanged. The problem is controlled. Pertinent negatives include no blurred vision, chest pain, headaches, palpitations or shortness of breath.   Bronchitis - completed a course of Doxycycline.  He feels much better.  He has not had to use the inhaler.   Review of Systems  Constitutional: Negative for fever, chills and fatigue.  HENT: Positive for congestion and sinus pressure. Negative for hearing loss.   Eyes: Negative for blurred vision and visual disturbance.  Respiratory: Negative for cough, chest tightness, shortness of breath and wheezing.   Cardiovascular: Negative for chest pain, palpitations and leg swelling.  Gastrointestinal: Negative for abdominal pain.  Neurological: Negative for dizziness, tremors, syncope and headaches.    Patient Active Problem List   Diagnosis Date Noted  . Type 2 diabetes mellitus with diabetic nephropathy, without long-term current use of insulin ([place]) 11/28/2014  . LBP (low back pain) 09/21/2014  . Lumbar canal stenosis 09/21/2014  . Abnormal prostate specific antigen 04/17/2014  . Enlarged prostate with lower urinary tract symptoms (LUTS) 04/17/2014  . Impaired renal function 04/17/2014  .  Dyslipidemia 04/17/2014  . 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 Essential (primary) hypertension 04/17/2014  . Esophagitis, reflux 04/17/2014  . Polypharmacy 04/17/2014  . Back muscle spasm 04/17/2014  . Degenerative arthritis of finger 04/17/2014  . Foot pain 04/17/2014  . Breast development in males 04/17/2014  . 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 Arthritis of knee, degenerative 11/03/2013  . Chronic kidney disease (CKD), stage III (moderate) 09/15/2012  . Benign prostatic hyperplasia with urinary obstruction 09/11/2011  . EDEMA 08/24/2009    Prior to Admission medications   Medication Sig Start Date End Date Taking? Authorizing Provider  aspirin 81 MG tablet Take 81 mg by mouth daily.     Yes Historical Provider, MD  atenolol (TENORMIN) 50 MG tablet Take 1 tablet (50 mg total) by mouth at bedtime. 01/12/15  Yes Glean Hess, MD  cholecalciferol (VITAMIN D) 1000 UNITS tablet Take 1 tablet by mouth daily.   Yes Historical Provider, MD  docusate sodium (COLACE) 100 MG capsule Take by mouth.   Yes Historical Provider, MD  glucose blood test strip 1 each 2 (two) times daily. 06/24/13  Yes Historical Provider, MD  losartan-hydrochlorothiazide (HYZAAR) 100-25 MG tablet Take 1 tablet by mouth daily. 01/12/15  Yes Glean Hess, MD  Misc Natural Products (OSTEO BI-FLEX TRIPLE STRENGTH) TABS Take 2 tablets by mouth daily.   Yes Historical Provider, MD  Multiple Vitamins-Minerals (CENTRUM SILVER) tablet Take 1 tablet by mouth daily.     Yes Historical Provider, MD  Omega-3 Fatty Acids (FISH OIL) 1000 MG CAPS Take 1 capsule by mouth daily at 2 PM. Reported on 12/29/2014   Yes Historical Provider, MD  pantoprazole (PROTONIX) 40 MG tablet Take  1 tablet (40 mg total) by mouth daily. 01/12/15  Yes Glean Hess, MD  Probiotic Product (PROBIOTIC & ACIDOPHILUS EX ST) CAPS Take 1 capsule by mouth daily at 2 PM daily at 2 PM.   Yes Historical Provider, MD  rosuvastatin (CRESTOR) 40 MG  tablet Take 1 tablet (40 mg total) by mouth at bedtime. 01/12/15  Yes Glean Hess, MD  sitaGLIPtin (JANUVIA) 100 MG tablet Take 1 tablet (100 mg total) by mouth daily. 01/12/15  Yes Glean Hess, MD  tadalafil (CIALIS) 20 MG tablet Take 20 mg by mouth daily.     Yes Historical Provider, MD  tamsulosin (FLOMAX) 0.4 MG CAPS capsule Take 1 capsule by mouth daily at 2 PM daily at 2 PM.   Yes Historical Provider, MD  albuterol (PROVENTIL HFA;VENTOLIN HFA) 108 (90 Base) MCG/ACT inhaler Inhale 1-2 puffs into the lungs every 6 (six) hours as needed for wheezing or shortness of breath. Patient not taking: Reported on 04/09/2015 03/17/15   Olen Cordial, NP  baclofen (LIORESAL) 10 MG tablet Take 1 tablet (10 mg total) by mouth at bedtime. Patient not taking: Reported on 03/26/2015 12/29/14   Glean Hess, MD    Allergies  Allergen Reactions  . Amlodipine     Other reaction(s): Edema  . Codeine   . Penicillin G Rash  . Rocephin  [Ceftriaxone] Rash    Past Surgical History  Procedure Laterality Date  . Ankle arthroscopy with open reduction internal fixation (orif) Left 1998  . Hernia repair Left 0000000    umbilical and left inguinal  . Knee arthroscopy Right 2006  . Thumb arthroscopy Right 2013    tendon flap to joint    Social History  Substance Use Topics  . Smoking status: Never Smoker   . Smokeless tobacco: None  . Alcohol Use: No     Medication list has been reviewed and updated.   Physical Exam  Constitutional: He is oriented to person, place, and time. He appears well-developed and well-nourished.  HENT:  Nose: Left sinus exhibits maxillary sinus tenderness.  Neck: Carotid bruit is not present.  Cardiovascular: Normal rate, regular rhythm and normal heart sounds.   Pulmonary/Chest: Breath sounds normal. He has no wheezes. He has no rales.  Lymphadenopathy:    He has no cervical adenopathy.  Neurological: He is alert and oriented to person, place, and time.  Skin:  Skin is warm and dry.  Psychiatric: He has a normal mood and affect. His speech is normal.    BP 120/62 mmHg  Pulse 74  Temp(Src) 98.4 F (36.9 C) (Oral)  Ht 6\' 2"  (1.88 m)  Wt 201 lb (91.173 kg)  BMI 25.80 kg/m2  SpO2 94%  Assessment and Plan: 1. Type 2 diabetes mellitus with diabetic nephropathy, without long-term current use of insulin ([facility]) Continue oral agents - may be slightly higher due to recent prednisone therapy - Hemoglobin A1c  2. Essential (primary) hypertension controlled  3. Chronic maxillary sinusitis Seeing ENT next week for ear complaints - should ask for a sinus evaluation as well  4. Bronchitis Completed course of therapy - increase activity as tolerated   Halina Maidens, MD [facility]  04/09/2015

## 2015-04-10 LAB — HEMOGLOBIN A1C
Est. average glucose Bld gHb Est-mCnc: 174 mg/dL
Hgb A1c MFr Bld: 7.7 % — ABNORMAL HIGH (ref 4.8–5.6)

## 2015-04-11 ENCOUNTER — Telehealth: Payer: Self-pay

## 2015-04-11 NOTE — Telephone Encounter (Signed)
-----   Message from Glean Hess, MD sent at 04/10/2015  8:36 AM EDT ----- DM is not as good - probably from prednisone therapy.  Continue same medications and diet - will not change anything at this time.

## 2015-04-11 NOTE — Telephone Encounter (Signed)
Spoke with patient. Patient advised of all results and verbalized understanding. Will call back with any future questions or concerns. MAH  

## 2015-04-12 DIAGNOSIS — J019 Acute sinusitis, unspecified: Secondary | ICD-10-CM | POA: Diagnosis not present

## 2015-04-12 DIAGNOSIS — H6123 Impacted cerumen, bilateral: Secondary | ICD-10-CM | POA: Diagnosis not present

## 2015-04-23 DIAGNOSIS — L570 Actinic keratosis: Secondary | ICD-10-CM | POA: Diagnosis not present

## 2015-05-29 DIAGNOSIS — R69 Illness, unspecified: Secondary | ICD-10-CM | POA: Diagnosis not present

## 2015-06-11 ENCOUNTER — Encounter: Payer: Medicare Other | Admitting: Internal Medicine

## 2015-07-30 ENCOUNTER — Encounter: Payer: Medicare Other | Admitting: Internal Medicine

## 2015-08-06 DIAGNOSIS — C44629 Squamous cell carcinoma of skin of left upper limb, including shoulder: Secondary | ICD-10-CM | POA: Diagnosis not present

## 2015-08-06 DIAGNOSIS — L570 Actinic keratosis: Secondary | ICD-10-CM | POA: Diagnosis not present

## 2015-08-06 DIAGNOSIS — D485 Neoplasm of uncertain behavior of skin: Secondary | ICD-10-CM | POA: Diagnosis not present

## 2015-08-13 ENCOUNTER — Encounter: Payer: Self-pay | Admitting: Internal Medicine

## 2015-08-13 ENCOUNTER — Ambulatory Visit (INDEPENDENT_AMBULATORY_CARE_PROVIDER_SITE_OTHER): Payer: Medicare HMO | Admitting: Internal Medicine

## 2015-08-13 ENCOUNTER — Other Ambulatory Visit: Payer: Self-pay | Admitting: Internal Medicine

## 2015-08-13 VITALS — BP 121/78 | HR 57 | Resp 16 | Ht 72.0 in | Wt 204.0 lb

## 2015-08-13 DIAGNOSIS — E1121 Type 2 diabetes mellitus with diabetic nephropathy: Secondary | ICD-10-CM

## 2015-08-13 DIAGNOSIS — I10 Essential (primary) hypertension: Secondary | ICD-10-CM

## 2015-08-13 DIAGNOSIS — K2100 Gastro-esophageal reflux disease with esophagitis, without bleeding: Secondary | ICD-10-CM

## 2015-08-13 DIAGNOSIS — Z0000 Encounter for general adult medical examination without abnormal findings: Secondary | ICD-10-CM | POA: Diagnosis not present

## 2015-08-13 DIAGNOSIS — E785 Hyperlipidemia, unspecified: Secondary | ICD-10-CM | POA: Diagnosis not present

## 2015-08-13 LAB — POCT URINALYSIS DIPSTICK
BILIRUBIN UA: NEGATIVE
GLUCOSE UA: NEGATIVE
Ketones, UA: NEGATIVE
LEUKOCYTES UA: NEGATIVE
NITRITE UA: NEGATIVE
Protein, UA: NEGATIVE
RBC UA: NEGATIVE
Spec Grav, UA: 1.01
pH, UA: 6.5

## 2015-08-13 NOTE — Patient Instructions (Signed)
Diabetic Retinopathy Diabetic retinopathy is a disease of the light-sensitive membrane at the back of the eye (retina). It is a complication of diabetes and a common cause of blindness. Early detection of the disease is key to keeping your eyes healthy.  CAUSES  Diabetic retinopathy is caused by blood sugar (glucose) levels that are too high over an extended period of time. High blood sugars cause damage to the small blood vessels of the retina, allowing blood to leak through the vessel walls. This causes visual impairment and eventually blindness. RISK FACTORS  High blood pressure.  Having diabetes for a long time.  Having poorly controlled blood sugars. SIGNS AND SYMPTOMS  In the early stages of diabetic retinopathy, there are often no symptoms. As the condition advances, symptoms may include:  Blurred vision. This is usually caused by a swelling due to abnormal blood glucose levels. The blurriness may go away when blood glucose levels return to normal.  Moving specks or dark spots (floaters) in your vision. These can be caused by a small retinal hemorrhage. A hemorrhage is bleeding from blood vessels.  Missing parts of your field of vision, such as things at the side. This can be caused by larger retinal hemorrhages.  Difficulty reading books or signs.  Double vision.  Pain in one or both eyes.  Feeling pressure in one or both eyes.  Trouble seeing straight lines. Straight lines do not look straight.  Redness of the eyes that does not go away. DIAGNOSIS  Your eye care specialist can detect changes in the blood vessels of your eye by putting drops in your eyes that enlarge (dilate) your pupils. This allows your eye care specialist to get a good look at your retina to see if there are any changes that have occurred as a result of your diabetes. You should have your eyes examined once a year. TREATMENT  Your eye care specialist may use a special laser beam to seal the blood vessels  of the retina and stop them from leaking. Early detection and treatment are important so that further damage to your eyes can be prevented. In addition, managing your blood sugars and keeping them in the target range can slow the progress of the disease. HOME CARE INSTRUCTIONS   Keep your blood pressure within your target range.  Keep your blood glucose levels within your target range.  Follow your health care provider's instructions regarding diet and other means for controlling your blood glucose levels.  Check your blood levels for glucose as recommended by your health care provider.  Keep regular appointments with your eye specialist. An eye specialist can usually see diabetic retinopathy developing long before it starts causing problems. In many cases, it can be treated to prevent complications from occurring. If you have diabetes, you should have your eyes checked at least every year. Your risk of retinopathy increases the longer you have the disease.  If you smoke, quit. Ask your health care provider for help if needed. Smoking can make retinopathy worse. SEEK MEDICAL CARE IF:   You notice gradual blurring or other changes in your vision over time.  You notice that your glasses or contact lenses do not make things look as sharp as they once did.  You have trouble reading or seeing details at a distance with either eye.  You notice a sudden change in your vision or notice that parts of your field of vision appear missing or hazy.  You suddenly see moving specks or dark spots   in the field of vision of either eye.  You have sudden partial loss of vision in either eye.   This information is not intended to replace advice given to you by your health care provider. Make sure you discuss any questions you have with your health care provider.   Document Released: 12/21/1999 Document Revised: 10/13/2012 Document Reviewed: 06/14/2012 Elsevier Interactive Patient Education 2016 [employer]  Inc[employer]  

## 2015-08-13 NOTE — Progress Notes (Signed)
Patient: Maxwell Stanley, Male    DOB: [birth year], 82 y.o.   MRN: [id number] Visit Date: 08/13/2015  Today's Provider: Halina Maidens, MD   Chief Complaint  Patient presents with  . Medicare Wellness    passed Mini 6  . Hypertension  . Gastroesophageal Reflux  . Diabetes  . Hyperlipidemia  . Plantar Fasciitis    Feet hurting more at night    Subjective:    Annual wellness visit Maxwell Stanley is a 81 y.o. male who presents today for his Subsequent Annual Wellness Visit. He feels well. He reports exercising regularly. He reports he is sleeping well.   ----------------------------------------------------------- Hypertension  This is a chronic problem. The current episode started more than 1 year ago. The problem is unchanged. The problem is controlled. Pertinent negatives include no chest pain, headaches, palpitations or shortness of breath.  Gastroesophageal Reflux  He reports no abdominal pain, no chest pain, no choking or no wheezing. This is a chronic problem. The current episode started more than 1 year ago. The problem occurs rarely. The problem has been resolved. Pertinent negatives include no fatigue. He has tried a PPI for the symptoms.  Diabetes  He presents for his follow-up diabetic visit. He has type 2 diabetes mellitus. His disease course has been worsening. Pertinent negatives for hypoglycemia include no dizziness or headaches. Associated symptoms include foot paresthesias. Pertinent negatives for diabetes include no chest pain and no fatigue. Current diabetic treatment includes oral agent (monotherapy). He is compliant with treatment all of the time. His weight is stable. When asked about meal planning, he reported none. His home blood glucose trend is increasing steadily. His breakfast blood glucose is taken between 6-7 am. His breakfast blood glucose range is generally 140-180 mg/dl. An ACE inhibitor/angiotensin II receptor blocker is being taken. Eye exam is not  current.  Hyperlipidemia  This is a chronic problem. The current episode started more than 1 year ago. Pertinent negatives include no chest pain, myalgias or shortness of breath. Current antihyperlipidemic treatment includes statins.   Lab Results  Component Value Date   HGBA1C 7.7 (H) 04/09/2015   Lab Results  Component Value Date   CREATININE 1.13 11/28/2014     Review of Systems  Constitutional: Negative for appetite change, chills, diaphoresis, fatigue and unexpected weight change.  HENT: Negative for hearing loss, tinnitus, trouble swallowing and voice change.   Eyes: Negative for visual disturbance.  Respiratory: Negative for choking, shortness of breath and wheezing.   Cardiovascular: Negative for chest pain, palpitations and leg swelling.  Gastrointestinal: Negative for abdominal pain, blood in stool, constipation and diarrhea.  Genitourinary: Negative for difficulty urinating, dysuria and frequency.  Musculoskeletal: Negative for arthralgias, back pain and myalgias.  Skin: Negative for color change and rash.  Neurological: Positive for numbness (decreased sensation in toes). Negative for dizziness, syncope and headaches.  Hematological: Negative for adenopathy.  Psychiatric/Behavioral: Negative for dysphoric mood and sleep disturbance.    Social History   Social History  . Marital status: Married    Spouse name: N/A  . Number of children: N/A  . Years of education: N/A   Occupational History  . Not on file.   Social History Main Topics  . Smoking status: Never Smoker  . Smokeless tobacco: Not on file  . Alcohol use No  . Drug use: No  . Sexual activity: Not on file   Other Topics Concern  . Not on file   Social History Narrative   Caffeine in  moderation    Patient Active Problem List   Diagnosis Date Noted  . Chronic maxillary sinusitis 04/09/2015  . Type 2 diabetes mellitus with diabetic nephropathy, without long-term current use of insulin ([place])  11/28/2014  . LBP (low back pain) 09/21/2014  . Lumbar canal stenosis 09/21/2014  . Abnormal prostate specific antigen 04/17/2014  . Enlarged prostate with lower urinary tract symptoms (LUTS) 04/17/2014  . Impaired renal function 04/17/2014  . Dyslipidemia 04/17/2014  . 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 Essential (primary) hypertension 04/17/2014  . Esophagitis, reflux 04/17/2014  . Polypharmacy 04/17/2014  . Back muscle spasm 04/17/2014  . Degenerative arthritis of finger 04/17/2014  . Foot pain 04/17/2014  . Breast development in males 04/17/2014  . 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 Arthritis of knee, degenerative 11/03/2013  . Chronic kidney disease (CKD), stage III (moderate) 09/15/2012  . Benign prostatic hyperplasia with urinary obstruction 09/11/2011  . EDEMA 08/24/2009    Past Surgical History:  Procedure Laterality Date  . ANKLE ARTHROSCOPY WITH OPEN REDUCTION INTERNAL FIXATION (ORIF) Left 1998  . HERNIA REPAIR Left 0000000   umbilical and left inguinal  . KNEE ARTHROSCOPY Right 2006  . THUMB ARTHROSCOPY Right 2013   tendon flap to joint    His family history includes Coronary artery disease in his father; Diabetes in his mother; Hyperlipidemia in his father; Hypertension in his father.    Previous Medications   ASPIRIN 81 MG TABLET    Take 81 mg by mouth daily.     ATENOLOL (TENORMIN) 50 MG TABLET    Take 1 tablet (50 mg total) by mouth at bedtime.   BACLOFEN (LIORESAL) 10 MG TABLET    Take 1 tablet (10 mg total) by mouth at bedtime.   CHOLECALCIFEROL (VITAMIN D) 1000 UNITS TABLET    Take 1 tablet by mouth daily.   DOCUSATE SODIUM (COLACE) 100 MG CAPSULE    Take by mouth.   GLUCOSE BLOOD TEST STRIP    1 each 2 (two) times daily.   LOSARTAN-HYDROCHLOROTHIAZIDE (HYZAAR) 100-25 MG TABLET    Take 1 tablet by mouth daily.   MISC NATURAL PRODUCTS (OSTEO BI-FLEX TRIPLE STRENGTH) TABS    Take 2 tablets by mouth daily.   MULTIPLE VITAMINS-MINERALS (CENTRUM  SILVER) TABLET    Take 1 tablet by mouth daily.     OMEGA-3 FATTY ACIDS (FISH OIL) 1000 MG CAPS    Take 1 capsule by mouth daily at 2 PM. Reported on 12/29/2014   PANTOPRAZOLE (PROTONIX) 40 MG TABLET    Take 1 tablet (40 mg total) by mouth daily.   PROBIOTIC PRODUCT (PROBIOTIC & ACIDOPHILUS EX ST) CAPS    Take 1 capsule by mouth daily at 2 PM daily at 2 PM.   ROSUVASTATIN (CRESTOR) 40 MG TABLET    Take 1 tablet (40 mg total) by mouth at bedtime.   SITAGLIPTIN (JANUVIA) 100 MG TABLET    Take 1 tablet (100 mg total) by mouth daily.   TADALAFIL (CIALIS) 20 MG TABLET    Take 20 mg by mouth daily.     TAMSULOSIN (FLOMAX) 0.4 MG CAPS CAPSULE    Take 1 capsule by mouth daily at 2 PM daily at 2 PM.    Patient Care Team: Glean Hess, MD as PCP - General (Family Medicine) Murrell Redden, MD (Urology) Jannet Mantis, MD (Dermatology)     Objective:   Vitals: BP 121/78 (BP Location: Right Arm, Patient Position: Sitting, Cuff Size: Large)  Pulse (!) 57   Resp 16   Ht 6' (1.829 m)   Wt 204 lb (92.5 kg)   SpO2 94%   BMI 27.67 kg/m   Physical Exam  Constitutional: He is oriented to person, place, and time. He appears well-developed and well-nourished.  HENT:  Head: Normocephalic.  Right Ear: Tympanic membrane, external ear and ear canal normal.  Left Ear: Tympanic membrane, external ear and ear canal normal.  Nose: Nose normal.  Mouth/Throat: Uvula is midline and oropharynx is clear and moist.  Eyes: Conjunctivae and EOM are normal. Pupils are equal, round, and reactive to light.  Neck: Normal range of motion. Neck supple. Carotid bruit is not present. No thyromegaly present.  Cardiovascular: Normal rate, regular rhythm, normal heart sounds and intact distal pulses.   Pulmonary/Chest: Effort normal and breath sounds normal. He has no wheezes. Right breast exhibits no mass. Left breast exhibits no mass.  Abdominal: Soft. Normal appearance and bowel sounds are normal. There is no  hepatosplenomegaly. There is no tenderness.  Musculoskeletal: Normal range of motion. He exhibits no edema or tenderness.  Lymphadenopathy:    He has no cervical adenopathy.  Neurological: He is alert and oriented to person, place, and time. He has normal reflexes.  Foot exam - normal skin, nails, pulses and sensation bilaterally Subjective decreased in sensation distal toes - monofilament exam intact bilaterally  Skin: Skin is warm, dry and intact. Lesion (multiple sites open from recent dermatology visit with cryo treatment) noted.  Psychiatric: He has a normal mood and affect. His speech is normal and behavior is normal. Judgment and thought content normal.  Nursing note and vitals reviewed.   Activities of Daily Living In your present state of health, do you have any difficulty performing the following activities: 08/13/2015  Hearing? N  Vision? N  Difficulty concentrating or making decisions? N  Walking or climbing stairs? N  Dressing or bathing? N  Doing errands, shopping? N  Preparing Food and eating ? N  Using the Toilet? N  In the past six months, have you accidently leaked urine? N  Do you have problems with loss of bowel control? N  Managing your Medications? N  Managing your Finances? N  Housekeeping or managing your Housekeeping? N  Some recent data might be hidden    Fall Risk Assessment Fall Risk  08/13/2015 08/13/2015 11/28/2014 07/28/2014  Falls in the past year? No No Yes No  Number falls in past yr: - - 1 -  Injury with Fall? - - No -  Follow up - - Falls prevention discussed -      Depression Screen PHQ 2/9 Scores 08/13/2015 08/13/2015 11/28/2014 07/28/2014  PHQ - 2 Score 0 0 0 0    Cognitive Testing - 6-CIT   Correct? Score   What year is it? yes 0 Yes = 0    No = 4  What month is it? yes 0 Yes = 0    No = 3  Remember:     [place]ia [place], [place], [place]     What time is it? yes 0 Yes = 0    No = 3  Count backwards from 20 to 1 yes 0 Correct = 0    1  error = 2   More than 1 error = 4  Say the months of the year in reverse. yes 0 Correct = 0    1 error = 2   More than 1 error = 4  What address did I ask you to remember? yes 0 Correct = 0  1 error = 2    2 error = 4    3 error = 6    4 error = 8    All wrong = 10       TOTAL SCORE  0/28   Interpretation:  Normal  Normal (0-7) Abnormal (8-28)        Medicare Annual Wellness Visit Summary:  Reviewed patient's Family Medical History Reviewed and updated list of patient's medical providers Assessment of cognitive impairment was done Assessed patient's functional ability Established a written schedule for health screening [place] Completed and Reviewed  Exercise Activities and Dietary recommendations Goals    . Weight (lb) < 200 lb (90.7 kg)          Wants  To lose some weight after gaining over the summer.        Immunization History  Administered Date(s) Administered  . Influenza,inj,Quad PF,36+ Mos 09/26/2014  . Pneumococcal Conjugate-13 10/26/2013  . Pneumococcal Polysaccharide-23 01/31/2011  . Tdap 10/03/2010  . Zoster 04/16/2000    Health Maintenance  Topic Date Due  . OPHTHALMOLOGY EXAM  03/07/2015  . INFLUENZA VACCINE  08/07/2015  . HEMOGLOBIN A1C  02/13/2016  . FOOT EXAM  08/12/2016  . TETANUS/TDAP  10/02/2020  . ZOSTAVAX  Completed  . PNA vac Low Risk Adult  Completed     Discussed health benefits of physical activity, and encouraged him to engage in regular exercise appropriate for his age and condition.    ------------------------------------------------------------------------------------------------------------   Assessment & Plan:  1. Medicare annual wellness visit, subsequent Measures satisfied  2. Essential (primary) hypertension controlled - CBC with Differential/Platelet  3. Type 2 diabetes mellitus with diabetic nephropathy, without long-term current use of insulin (HCC) Pt encouraged to schedule eye exam Foot sx  likely early neuropathy but might improve with tighter BS control Will consider adding metformin if needed - Comprehensive metabolic panel - Hemoglobin A1c - TSH - Microalbumin / creatinine urine ratio - POCT urinalysis dipstick  4. Esophagitis, reflux Controlled on PPI  5. Dyslipidemia On statin therapy - Lipid panel     Halina Maidens, MD [facility]  08/13/2015

## 2015-08-14 LAB — COMPREHENSIVE METABOLIC PANEL
ALBUMIN: 4.4 g/dL (ref 3.5–4.8)
ALT: 23 IU/L (ref 0–44)
AST: 24 IU/L (ref 0–40)
Albumin/Globulin Ratio: 1.8 (ref 1.2–2.2)
Alkaline Phosphatase: 72 IU/L (ref 39–117)
BUN / CREAT RATIO: 19 (ref 10–24)
BUN: 24 mg/dL (ref 8–27)
Bilirubin Total: 0.6 mg/dL (ref 0.0–1.2)
CALCIUM: 9.3 mg/dL (ref 8.6–10.2)
CO2: 24 mmol/L (ref 18–29)
CREATININE: 1.25 mg/dL (ref 0.76–1.27)
Chloride: 99 mmol/L (ref 96–106)
GFR, EST AFRICAN AMERICAN: 63 mL/min/{1.73_m2} (ref >59)
GFR, EST NON AFRICAN AMERICAN: 55 mL/min/{1.73_m2} — AB (ref >59)
GLOBULIN, TOTAL: 2.4 g/dL (ref 1.5–4.5)
Glucose: 143 mg/dL — ABNORMAL HIGH (ref 65–99)
Potassium: 4.3 mmol/L (ref 3.5–5.2)
SODIUM: 139 mmol/L (ref 134–144)
Total Protein: 6.8 g/dL (ref 6.0–8.5)

## 2015-08-14 LAB — CBC WITH DIFFERENTIAL/PLATELET
BASOS ABS: 0 10*3/uL (ref 0.0–0.2)
BASOS: 0 %
EOS (ABSOLUTE): 0.5 10*3/uL — AB (ref 0.0–0.4)
Eos: 8 %
HEMATOCRIT: 44.8 % (ref 37.5–51.0)
Hemoglobin: 15.7 g/dL (ref 12.6–17.7)
IMMATURE GRANS (ABS): 0 10*3/uL (ref 0.0–0.1)
IMMATURE GRANULOCYTES: 0 %
LYMPHS: 38 %
Lymphocytes Absolute: 2.2 10*3/uL (ref 0.7–3.1)
MCH: 32.8 pg (ref 26.6–33.0)
MCHC: 35 g/dL (ref 31.5–35.7)
MCV: 94 fL (ref 79–97)
MONOS ABS: 0.5 10*3/uL (ref 0.1–0.9)
Monocytes: 8 %
NEUTROS PCT: 46 %
Neutrophils Absolute: 2.7 10*3/uL (ref 1.4–7.0)
Platelets: 172 10*3/uL (ref 150–379)
RBC: 4.78 x10E6/uL (ref 4.14–5.80)
RDW: 13.4 % (ref 12.3–15.4)
WBC: 5.9 10*3/uL (ref 3.4–10.8)

## 2015-08-14 LAB — HEMOGLOBIN A1C
ESTIMATED AVERAGE GLUCOSE: 169 mg/dL
Hgb A1c MFr Bld: 7.5 % — ABNORMAL HIGH (ref 4.8–5.6)

## 2015-08-14 LAB — LIPID PANEL
CHOL/HDL RATIO: 5.8 ratio — AB (ref 0.0–5.0)
Cholesterol, Total: 161 mg/dL (ref 100–199)
HDL: 28 mg/dL — AB (ref >39)
LDL Calculated: 63 mg/dL (ref 0–99)
Triglycerides: 351 mg/dL — ABNORMAL HIGH (ref 0–149)
VLDL CHOLESTEROL CAL: 70 mg/dL — AB (ref 5–40)

## 2015-08-14 LAB — TSH: TSH: 1.89 u[IU]/mL (ref 0.450–4.500)

## 2015-08-15 LAB — MICROALBUMIN / CREATININE URINE RATIO
Creatinine, Urine: 114.9 mg/dL
MICROALB/CREAT RATIO: <2.6 mg/g creat (ref 0.0–30.0)
Microalbumin, Urine: <3 ug/mL

## 2015-08-28 DIAGNOSIS — H25013 Cortical age-related cataract, bilateral: Secondary | ICD-10-CM | POA: Diagnosis not present

## 2015-08-28 LAB — HM DIABETES EYE EXAM

## 2015-09-03 DIAGNOSIS — L570 Actinic keratosis: Secondary | ICD-10-CM | POA: Diagnosis not present

## 2015-09-03 DIAGNOSIS — C44629 Squamous cell carcinoma of skin of left upper limb, including shoulder: Secondary | ICD-10-CM | POA: Diagnosis not present

## 2015-09-03 DIAGNOSIS — D485 Neoplasm of uncertain behavior of skin: Secondary | ICD-10-CM | POA: Diagnosis not present

## 2015-09-03 DIAGNOSIS — C44229 Squamous cell carcinoma of skin of left ear and external auricular canal: Secondary | ICD-10-CM | POA: Diagnosis not present

## 2015-09-19 DIAGNOSIS — C44229 Squamous cell carcinoma of skin of left ear and external auricular canal: Secondary | ICD-10-CM | POA: Diagnosis not present

## 2015-09-19 DIAGNOSIS — L578 Other skin changes due to chronic exposure to nonionizing radiation: Secondary | ICD-10-CM | POA: Diagnosis not present

## 2015-09-19 DIAGNOSIS — Z08 Encounter for follow-up examination after completed treatment for malignant neoplasm: Secondary | ICD-10-CM | POA: Diagnosis not present

## 2015-09-19 DIAGNOSIS — L570 Actinic keratosis: Secondary | ICD-10-CM | POA: Diagnosis not present

## 2015-09-19 DIAGNOSIS — Z1283 Encounter for screening for malignant neoplasm of skin: Secondary | ICD-10-CM | POA: Diagnosis not present

## 2015-09-19 DIAGNOSIS — C44609 Unspecified malignant neoplasm of skin of left upper limb, including shoulder: Secondary | ICD-10-CM | POA: Diagnosis not present

## 2015-09-19 DIAGNOSIS — Z85828 Personal history of other malignant neoplasm of skin: Secondary | ICD-10-CM | POA: Diagnosis not present

## 2015-09-19 DIAGNOSIS — D485 Neoplasm of uncertain behavior of skin: Secondary | ICD-10-CM | POA: Diagnosis not present

## 2015-09-19 DIAGNOSIS — L821 Other seborrheic keratosis: Secondary | ICD-10-CM | POA: Diagnosis not present

## 2015-09-25 DIAGNOSIS — R69 Illness, unspecified: Secondary | ICD-10-CM | POA: Diagnosis not present

## 2015-09-26 DIAGNOSIS — L570 Actinic keratosis: Secondary | ICD-10-CM | POA: Diagnosis not present

## 2015-09-27 DIAGNOSIS — I129 Hypertensive chronic kidney disease with stage 1 through stage 4 chronic kidney disease, or unspecified chronic kidney disease: Secondary | ICD-10-CM | POA: Diagnosis not present

## 2015-09-27 DIAGNOSIS — N183 Chronic kidney disease, stage 3 (moderate): Secondary | ICD-10-CM | POA: Diagnosis not present

## 2015-09-27 DIAGNOSIS — E1122 Type 2 diabetes mellitus with diabetic chronic kidney disease: Secondary | ICD-10-CM | POA: Diagnosis not present

## 2015-09-27 DIAGNOSIS — R809 Proteinuria, unspecified: Secondary | ICD-10-CM | POA: Diagnosis not present

## 2015-10-22 DIAGNOSIS — C44619 Basal cell carcinoma of skin of left upper limb, including shoulder: Secondary | ICD-10-CM | POA: Diagnosis not present

## 2015-10-22 DIAGNOSIS — C44229 Squamous cell carcinoma of skin of left ear and external auricular canal: Secondary | ICD-10-CM | POA: Diagnosis not present

## 2015-10-22 DIAGNOSIS — C44609 Unspecified malignant neoplasm of skin of left upper limb, including shoulder: Secondary | ICD-10-CM | POA: Diagnosis not present

## 2015-11-07 DIAGNOSIS — L570 Actinic keratosis: Secondary | ICD-10-CM | POA: Diagnosis not present

## 2015-11-08 DIAGNOSIS — N401 Enlarged prostate with lower urinary tract symptoms: Secondary | ICD-10-CM | POA: Diagnosis not present

## 2015-11-08 DIAGNOSIS — R339 Retention of urine, unspecified: Secondary | ICD-10-CM | POA: Diagnosis not present

## 2015-11-08 DIAGNOSIS — R350 Frequency of micturition: Secondary | ICD-10-CM | POA: Diagnosis not present

## 2015-11-08 DIAGNOSIS — R972 Elevated prostate specific antigen [PSA]: Secondary | ICD-10-CM | POA: Diagnosis not present

## 2015-11-08 DIAGNOSIS — N529 Male erectile dysfunction, unspecified: Secondary | ICD-10-CM | POA: Diagnosis not present

## 2015-12-12 ENCOUNTER — Encounter: Payer: Self-pay | Admitting: Internal Medicine

## 2015-12-12 ENCOUNTER — Ambulatory Visit (INDEPENDENT_AMBULATORY_CARE_PROVIDER_SITE_OTHER): Payer: Medicare HMO | Admitting: Internal Medicine

## 2015-12-12 VITALS — BP 136/78 | HR 68 | Resp 16 | Wt 207.8 lb

## 2015-12-12 DIAGNOSIS — E1121 Type 2 diabetes mellitus with diabetic nephropathy: Secondary | ICD-10-CM

## 2015-12-12 DIAGNOSIS — G5602 Carpal tunnel syndrome, left upper limb: Secondary | ICD-10-CM

## 2015-12-12 DIAGNOSIS — I10 Essential (primary) hypertension: Secondary | ICD-10-CM | POA: Diagnosis not present

## 2015-12-12 MED ORDER — SITAGLIPTIN PHOSPHATE 100 MG PO TABS: 100.0000 mg | ORAL_TABLET | Freq: Every day | ORAL | 3 refills | Status: DC

## 2015-12-12 MED ORDER — PANTOPRAZOLE SODIUM 40 MG PO TBEC: 40.0000 mg | DELAYED_RELEASE_TABLET | Freq: Every day | ORAL | 3 refills | Status: DC

## 2015-12-12 MED ORDER — LOSARTAN POTASSIUM-HCTZ 100-25 MG PO TABS: 1.0000 | ORAL_TABLET | Freq: Every day | ORAL | 3 refills | Status: DC

## 2015-12-12 MED ORDER — ATENOLOL 50 MG PO TABS
50.0000 mg | ORAL_TABLET | Freq: Every day | ORAL | 3 refills | Status: DC
Start: 2015-12-12 — End: 2016-12-11

## 2015-12-12 NOTE — Progress Notes (Signed)
Date:  12/12/2015   Name:  Maxwell Stanley   DOB:  26-Nov-1934   MRN:  [id number]   Chief Complaint: Diabetes Diabetes  He presents for his follow-up diabetic visit. He has type 2 diabetes mellitus. His disease course has been stable. Pertinent negatives for hypoglycemia include no headaches or tremors. Pertinent negatives for diabetes include no chest pain, no fatigue, no polydipsia and no polyuria. Current diabetic treatment includes oral agent (monotherapy). He is compliant with treatment most of the time. There is no change in his home blood glucose trend. His breakfast blood glucose is taken between 6-7 am. His breakfast blood glucose range is generally 140-180 mg/dl. An ACE inhibitor/angiotensin II receptor blocker is being taken. Eye exam is current (seen in September 2017 but no note received).  Hypertension  This is a chronic problem. The current episode started more than 1 year ago. The problem is unchanged. Pertinent negatives include no chest pain, headaches, palpitations or shortness of breath.  Numbness in fingers on left hand - sometimes painful and occurs when sitting watching TV at night and awakens him from sleep in bed.  Lab Results  Component Value Date   HGBA1C 7.5 (H) 08/13/2015     Review of Systems  Constitutional: Negative for appetite change, fatigue and unexpected weight change.  Eyes: Negative for visual disturbance.  Respiratory: Negative for cough, shortness of breath and wheezing.   Cardiovascular: Negative for chest pain, palpitations and leg swelling.  Gastrointestinal: Negative for abdominal pain and blood in stool.  Endocrine: Negative for polydipsia and polyuria.  Genitourinary: Negative for dysuria and hematuria.  Skin: Negative for color change and rash.  Neurological: Positive for numbness (in thumb and index finger left hand at night). Negative for tremors and headaches.  Psychiatric/Behavioral: Negative for dysphoric mood.    Patient Active  Problem List   Diagnosis Date Noted  . Carpal tunnel syndrome on left 12/12/2015  . Chronic maxillary sinusitis 04/09/2015  . Type 2 diabetes mellitus with diabetic nephropathy, without long-term current use of insulin ([place]) 11/28/2014  . LBP (low back pain) 09/21/2014  . Lumbar canal stenosis 09/21/2014  . Abnormal prostate specific antigen 04/17/2014  . Enlarged prostate with lower urinary tract symptoms (LUTS) 04/17/2014  . CKD stage 3 secondary to diabetes ([place]) 04/17/2014  . Dyslipidemia 04/17/2014  . 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 Essential (primary) hypertension 04/17/2014  . Esophagitis, reflux 04/17/2014  . Polypharmacy 04/17/2014  . Back muscle spasm 04/17/2014  . Degenerative arthritis of finger 04/17/2014  . Foot pain 04/17/2014  . Breast development in males 04/17/2014  . 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 Arthritis of knee, degenerative 11/03/2013  . Benign prostatic hyperplasia with urinary obstruction 09/11/2011  . EDEMA 08/24/2009    Prior to Admission medications   Medication Sig Start Date End Date Taking? Authorizing Provider  aspirin 81 MG tablet Take 81 mg by mouth daily.     Yes Historical Provider, MD  atenolol (TENORMIN) 50 MG tablet Take 1 tablet (50 mg total) by mouth at bedtime. 01/12/15  Yes Glean Hess, MD  baclofen (LIORESAL) 10 MG tablet Take 1 tablet (10 mg total) by mouth at bedtime. 12/29/14  Yes Glean Hess, MD  cholecalciferol (VITAMIN D) 1000 UNITS tablet Take 1 tablet by mouth daily.   Yes Historical Provider, MD  docusate sodium (COLACE) 100 MG capsule Take by mouth.   Yes Historical Provider, MD  finasteride (PROSCAR) 5 MG tablet Take 5 mg  by mouth daily.   Yes Historical Provider, MD  glucose blood test strip 1 each 2 (two) times daily. 06/24/13  Yes Historical Provider, MD  losartan-hydrochlorothiazide (HYZAAR) 100-25 MG tablet Take 1 tablet by mouth daily. 01/12/15  Yes Glean Hess, MD  Misc Natural Products  (OSTEO BI-FLEX TRIPLE STRENGTH) TABS Take 2 tablets by mouth daily.   Yes Historical Provider, MD  Multiple Vitamins-Minerals (CENTRUM SILVER) tablet Take 1 tablet by mouth daily.     Yes Historical Provider, MD  Omega-3 Fatty Acids (FISH OIL) 1000 MG CAPS Take 1 capsule by mouth daily at 2 PM. Reported on 12/29/2014   Yes Historical Provider, MD  pantoprazole (PROTONIX) 40 MG tablet Take 1 tablet (40 mg total) by mouth daily. 01/12/15  Yes Glean Hess, MD  Probiotic Product (PROBIOTIC & ACIDOPHILUS EX ST) CAPS Take 1 capsule by mouth daily at 2 PM daily at 2 PM.   Yes Historical Provider, MD  rosuvastatin (CRESTOR) 40 MG tablet Take 1 tablet (40 mg total) by mouth at bedtime. 01/12/15  Yes Glean Hess, MD  sitaGLIPtin (JANUVIA) 100 MG tablet Take 1 tablet (100 mg total) by mouth daily. 01/12/15  Yes Glean Hess, MD  tadalafil (CIALIS) 20 MG tablet Take 20 mg by mouth daily.     Yes Historical Provider, MD  tamsulosin (FLOMAX) 0.4 MG CAPS capsule Take 1 capsule by mouth daily at 2 PM daily at 2 PM.   Yes Historical Provider, MD    Allergies  Allergen Reactions  . Amlodipine     Other reaction(s): Edema  . Codeine   . Penicillin G Rash  . Rocephin  [Ceftriaxone] Rash    Past Surgical History:  Procedure Laterality Date  . ANKLE ARTHROSCOPY WITH OPEN REDUCTION INTERNAL FIXATION (ORIF) Left 1998  . HERNIA REPAIR Left 0000000   umbilical and left inguinal  . KNEE ARTHROSCOPY Right 2006  . THUMB ARTHROSCOPY Right 2013   tendon flap to joint    Social History  Substance Use Topics  . Smoking status: Never Smoker  . Smokeless tobacco: Not on file  . Alcohol use No     Medication list has been reviewed and updated.   Physical Exam  Constitutional: He is oriented to person, place, and time. He appears well-developed. No distress.  HENT:  Head: Normocephalic and atraumatic.  Cardiovascular: Normal rate, regular rhythm and normal heart sounds.   Pulmonary/Chest: Effort normal  and breath sounds normal. No respiratory distress. He has no wheezes. He has no rhonchi.  Musculoskeletal: Normal range of motion.  Neurological: He is alert and oriented to person, place, and time. He has normal strength.  Tinel's negative on left; Phalen's positive on left  Skin: Skin is warm and dry. No rash noted.  Psychiatric: He has a normal mood and affect. His speech is normal and behavior is normal. Thought content normal.  Nursing note and vitals reviewed.   BP 136/78   Pulse 68   Resp 16   Wt 207 lb 12.8 oz (94.3 kg)   BMI 28.18 kg/m   Assessment and Plan: 1. Essential (primary) hypertension controlled - atenolol (TENORMIN) 50 MG tablet; Take 1 tablet (50 mg total) by mouth at bedtime.  Dispense: 90 tablet; Refill: 3 - losartan-hydrochlorothiazide (HYZAAR) 100-25 MG tablet; Take 1 tablet by mouth daily.  Dispense: 90 tablet; Refill: 3  2. Type 2 diabetes mellitus with diabetic nephropathy, without long-term current use of insulin ([place]) Continue Januvia; will need to add medication  if uncontrolled - Hemoglobin A1c - sitaGLIPtin (JANUVIA) 100 MG tablet; Take 1 tablet (100 mg total) by mouth daily.  Dispense: 90 tablet; Refill: 3  3. Carpal tunnel syndrome on left Wrist splint in the evening and while sleeping   Halina Maidens, MD [facility]  12/12/2015

## 2015-12-12 NOTE — Patient Instructions (Signed)
Get a cock-up wrist splint to wear on left wrist in the evening and while sleeping.  Carpal Tunnel Syndrome Carpal tunnel syndrome is a condition that causes pain in your hand and arm. The carpal tunnel is a narrow area located on the palm side of your wrist. Repeated wrist motion or certain diseases may cause swelling within the tunnel. This swelling pinches the main nerve in the wrist (median nerve). What are the causes? This condition may be caused by:  Repeated wrist motions.  Wrist injuries.  Arthritis.  A cyst or tumor in the carpal tunnel.  Fluid buildup during pregnancy. Sometimes the cause of this condition is not known. What increases the risk? This condition is more likely to develop in:  People who have jobs that cause them to repeatedly move their wrists in the same motion, such as Art gallery manager.  Women.  People with certain conditions, such as:  Diabetes.  Obesity.  An underactive thyroid (hypothyroidism).  Kidney failure. What are the signs or symptoms? Symptoms of this condition include:  A tingling feeling in your fingers, especially in your thumb, index, and middle fingers.  Tingling or numbness in your hand.  An aching feeling in your entire arm, especially when your wrist and elbow are bent for long periods of time.  Wrist pain that goes up your arm to your shoulder.  Pain that goes down into your palm or fingers.  A weak feeling in your hands. You may have trouble grabbing and holding items. Your symptoms may feel worse during the night. How is this diagnosed? This condition is diagnosed with a medical history and physical exam. You may also have tests, including:  An electromyogram (EMG). This test measures electrical signals sent by your nerves into the muscles.  X-rays. How is this treated? Treatment for this condition includes:  Lifestyle changes. It is important to stop doing or modify the activity that caused your  condition.  Physical or occupational therapy.  Medicines for pain and inflammation. This may include medicine that is injected into your wrist.  A wrist splint.  Surgery. Follow these instructions at home: If you have a splint:  Wear it as told by your health care provider. Remove it only as told by your health care provider.  Loosen the splint if your fingers become numb and tingle, or if they turn cold and blue.  Keep the splint clean and dry. General instructions  Take over-the-counter and prescription medicines only as told by your health care provider.  Rest your wrist from any activity that may be causing your pain. If your condition is work related, talk to your employer about changes that can be made, such as getting a wrist pad to use while typing.  If directed, apply ice to the painful area:  Put ice in a plastic bag.  Place a towel between your skin and the bag.  Leave the ice on for 20 minutes, 2-3 times per day.  Keep all follow-up visits as told by your health care provider. This is important.  Do any exercises as told by your health care provider, physical therapist, or occupational therapist. Contact a health care provider if:  You have new symptoms.  Your pain is not controlled with medicines.  Your symptoms get worse. This information is not intended to replace advice given to you by your health care provider. Make sure you discuss any questions you have with your health care provider. Document Released: 12/21/1999 Document Revised: 05/03/2015 Document Reviewed:  05/10/2014 Elsevier Interactive Patient Education  2017 [employer].

## 2015-12-13 ENCOUNTER — Ambulatory Visit: Payer: Medicare HMO | Admitting: Internal Medicine

## 2015-12-13 LAB — HEMOGLOBIN A1C
Est. average glucose Bld gHb Est-mCnc: 157 mg/dL
HEMOGLOBIN A1C: 7.1 % — AB (ref 4.8–5.6)

## 2016-01-17 ENCOUNTER — Telehealth: Payer: Self-pay | Admitting: Internal Medicine

## 2016-01-17 ENCOUNTER — Other Ambulatory Visit: Payer: Self-pay | Admitting: Internal Medicine

## 2016-01-17 DIAGNOSIS — E1121 Type 2 diabetes mellitus with diabetic nephropathy: Secondary | ICD-10-CM

## 2016-01-17 MED ORDER — SITAGLIPTIN PHOSPHATE 100 MG PO TABS: 100.0000 mg | ORAL_TABLET | Freq: Every day | ORAL | 3 refills | Status: DC

## 2016-01-17 MED ORDER — ROSUVASTATIN CALCIUM 40 MG PO TABS: 40.0000 mg | ORAL_TABLET | Freq: Every day | ORAL | 3 refills | Status: DC

## 2016-01-17 NOTE — Telephone Encounter (Signed)
Pt called need refill on Rx Januvia 100mg  and Rosuvastatin  40 mg send to [facility] home delivery

## 2016-01-18 ENCOUNTER — Other Ambulatory Visit: Payer: Self-pay | Admitting: Internal Medicine

## 2016-01-18 DIAGNOSIS — E1121 Type 2 diabetes mellitus with diabetic nephropathy: Secondary | ICD-10-CM

## 2016-01-18 MED ORDER — SITAGLIPTIN PHOSPHATE 100 MG PO TABS: 100.0000 mg | ORAL_TABLET | Freq: Every day | ORAL | 3 refills | Status: DC

## 2016-01-18 NOTE — Telephone Encounter (Signed)
Called pt and talk to his wife that Dr. [name] sent the refill to [facility]

## 2016-01-21 ENCOUNTER — Other Ambulatory Visit: Payer: Self-pay | Admitting: Internal Medicine

## 2016-01-21 DIAGNOSIS — E1121 Type 2 diabetes mellitus with diabetic nephropathy: Secondary | ICD-10-CM

## 2016-01-21 MED ORDER — SITAGLIPTIN PHOSPHATE 100 MG PO TABS: 100.0000 mg | ORAL_TABLET | Freq: Every day | ORAL | 3 refills | Status: DC

## 2016-01-31 DIAGNOSIS — R69 Illness, unspecified: Secondary | ICD-10-CM | POA: Diagnosis not present

## 2016-04-11 ENCOUNTER — Ambulatory Visit (INDEPENDENT_AMBULATORY_CARE_PROVIDER_SITE_OTHER): Payer: Medicare HMO | Admitting: Internal Medicine

## 2016-04-11 ENCOUNTER — Encounter: Payer: Self-pay | Admitting: Internal Medicine

## 2016-04-11 VITALS — BP 114/68 | HR 55 | Ht 74.0 in | Wt 208.4 lb

## 2016-04-11 DIAGNOSIS — E1121 Type 2 diabetes mellitus with diabetic nephropathy: Secondary | ICD-10-CM | POA: Diagnosis not present

## 2016-04-11 DIAGNOSIS — N183 Chronic kidney disease, stage 3 unspecified: Secondary | ICD-10-CM

## 2016-04-11 DIAGNOSIS — M79672 Pain in left foot: Secondary | ICD-10-CM

## 2016-04-11 DIAGNOSIS — I10 Essential (primary) hypertension: Secondary | ICD-10-CM

## 2016-04-11 DIAGNOSIS — E1122 Type 2 diabetes mellitus with diabetic chronic kidney disease: Secondary | ICD-10-CM | POA: Diagnosis not present

## 2016-04-11 NOTE — Progress Notes (Signed)
Date:  04/11/2016   Name:  Maxwell Stanley   DOB:  27-Apr-1934   MRN:  [id number]   Chief Complaint: Diabetes (A1C check.) Diabetes  He presents for his follow-up diabetic visit. He has type 2 diabetes mellitus. His disease course has been fluctuating. Pertinent negatives for hypoglycemia include no headaches or tremors. Pertinent negatives for diabetes include no chest pain, no fatigue, no polydipsia and no polyuria. Diabetic complications include nephropathy. Current diabetic treatment includes oral agent (monotherapy). His weight is stable. He is following a generally healthy diet. He monitors blood glucose at home 1-2 x per day. His breakfast blood glucose is taken between 6-7 am. His breakfast blood glucose range is generally 140-180 mg/dl.  Hypertension  This is a chronic problem. The problem is controlled. Pertinent negatives include no chest pain, headaches, palpitations or shortness of breath. There are no compliance problems.   Foot Injury   There was no injury mechanism. The pain is present in the left foot. The quality of the pain is described as aching. The pain is moderate. Associated symptoms include an inability to bear weight. Pertinent negatives include no numbness. He reports no foreign bodies present. He has tried acetaminophen for the symptoms. The treatment provided mild relief.    Lab Results  Component Value Date   HGBA1C 7.1 (H) 12/12/2015    Review of Systems  Constitutional: Negative for appetite change, fatigue and unexpected weight change.  Eyes: Negative for visual disturbance.  Respiratory: Negative for cough, shortness of breath and wheezing.   Cardiovascular: Negative for chest pain, palpitations and leg swelling.  Gastrointestinal: Negative for abdominal pain and blood in stool.  Endocrine: Negative for polydipsia and polyuria.  Genitourinary: Negative for dysuria and hematuria.  Musculoskeletal: Positive for arthralgias (aching in the arch of his left  foot).  Skin: Negative for color change and rash.  Neurological: Negative for tremors, numbness and headaches.  Psychiatric/Behavioral: Negative for dysphoric mood.    Patient Active Problem List   Diagnosis Date Noted  . Carpal tunnel syndrome on left 12/12/2015  . Chronic maxillary sinusitis 04/09/2015  . Type 2 diabetes mellitus with diabetic nephropathy, without long-term current use of insulin ([place]) 11/28/2014  . LBP (low back pain) 09/21/2014  . Lumbar canal stenosis 09/21/2014  . Abnormal prostate specific antigen 04/17/2014  . Enlarged prostate with lower urinary tract symptoms (LUTS) 04/17/2014  . CKD stage 3 secondary to diabetes ([place]) 04/17/2014  . Dyslipidemia 04/17/2014  . 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 Essential (primary) hypertension 04/17/2014  . Esophagitis, reflux 04/17/2014  . Back muscle spasm 04/17/2014  . Degenerative arthritis of finger 04/17/2014  . Foot pain 04/17/2014  . Breast development in males 04/17/2014  . 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 Arthritis of knee, degenerative 11/03/2013  . EDEMA 08/24/2009    Prior to Admission medications   Medication Sig Start Date End Date Taking? Authorizing Provider  aspirin 81 MG tablet Take 81 mg by mouth daily.     Yes Historical Provider, MD  atenolol (TENORMIN) 50 MG tablet Take 1 tablet (50 mg total) by mouth at bedtime. 12/12/15  Yes Glean Hess, MD  baclofen (LIORESAL) 10 MG tablet Take 1 tablet (10 mg total) by mouth at bedtime. 12/29/14  Yes Glean Hess, MD  cholecalciferol (VITAMIN D) 1000 UNITS tablet Take 1 tablet by mouth daily.   Yes Historical Provider, MD  docusate sodium (COLACE) 100 MG capsule Take by mouth.   Yes Historical  Provider, MD  glucose blood test strip 1 each 2 (two) times daily. 06/24/13  Yes Historical Provider, MD  losartan-hydrochlorothiazide (HYZAAR) 100-25 MG tablet Take 1 tablet by mouth daily. 12/12/15  Yes Glean Hess, MD  Misc Natural Products  (OSTEO BI-FLEX TRIPLE STRENGTH) TABS Take 2 tablets by mouth daily.   Yes Historical Provider, MD  Multiple Vitamins-Minerals (CENTRUM SILVER) tablet Take 1 tablet by mouth daily.     Yes Historical Provider, MD  Omega-3 Fatty Acids (FISH OIL) 1000 MG CAPS Take 1 capsule by mouth daily at 2 PM. Reported on 12/29/2014   Yes Historical Provider, MD  pantoprazole (PROTONIX) 40 MG tablet Take 1 tablet (40 mg total) by mouth daily. 12/12/15  Yes Glean Hess, MD  Probiotic Product (PROBIOTIC & ACIDOPHILUS EX ST) CAPS Take 1 capsule by mouth daily at 2 PM daily at 2 PM.   Yes Historical Provider, MD  rosuvastatin (CRESTOR) 40 MG tablet Take 1 tablet (40 mg total) by mouth at bedtime. 01/17/16  Yes Glean Hess, MD  sitaGLIPtin (JANUVIA) 100 MG tablet Take 1 tablet (100 mg total) by mouth daily. 01/21/16  Yes Glean Hess, MD  tadalafil (CIALIS) 20 MG tablet Take 20 mg by mouth daily.     Yes Historical Provider, MD  tamsulosin (FLOMAX) 0.4 MG CAPS capsule Take 1 capsule by mouth daily at 2 PM daily at 2 PM.   Yes Historical Provider, MD    Allergies  Allergen Reactions  . Amlodipine     Other reaction(s): Edema  . Codeine   . Penicillin G Rash  . Rocephin  [Ceftriaxone] Rash    Past Surgical History:  Procedure Laterality Date  . ANKLE ARTHROSCOPY WITH OPEN REDUCTION INTERNAL FIXATION (ORIF) Left 1998  . HERNIA REPAIR Left 7564   umbilical and left inguinal  . KNEE ARTHROSCOPY Right 2006  . THUMB ARTHROSCOPY Right 2013   tendon flap to joint    Social History  Substance Use Topics  . Smoking status: Never Smoker  . Smokeless tobacco: Never Used  . Alcohol use No     Medication list has been reviewed and updated.   Physical Exam  Constitutional: He is oriented to person, place, and time. He appears well-developed. No distress.  HENT:  Head: Normocephalic and atraumatic.  Neck: Normal range of motion. Neck supple. Carotid bruit is not present.  Cardiovascular: Normal  rate, regular rhythm and normal heart sounds.   Pulmonary/Chest: Effort normal and breath sounds normal. No respiratory distress. He has no wheezes.  Musculoskeletal: Normal range of motion.  No foot deformity or  pain to palpation on left.  Neurological: He is alert and oriented to person, place, and time.  Skin: Skin is warm and dry. No rash noted.  Psychiatric: He has a normal mood and affect. His behavior is normal. Thought content normal.  Nursing note and vitals reviewed.   BP 114/68 (BP Location: Right Arm, Patient Position: Sitting, Cuff Size: Normal)   Pulse (!) 55   Ht 6\' 2"  (1.88 m)   Wt 208 lb 6.4 oz (94.5 kg)   SpO2 95%   BMI 26.76 kg/m   Assessment and Plan: 1. Type 2 diabetes mellitus with diabetic nephropathy, without long-term current use of insulin ([place]) Work harder on diet Consider low dose metformin - Hemoglobin A1c  2. Essential (primary) hypertension controlled  3. CKD stage 3 secondary to diabetes (HCC) Stable - no longer seeing Nephrology - Basic metabolic panel  4. Foot pain,  left Consult Podiatry   No orders of the defined types were placed in this encounter.   Halina Maidens, MD [facility]  04/11/2016

## 2016-04-12 LAB — BASIC METABOLIC PANEL
BUN/Creatinine Ratio: 18 (ref 10–24)
BUN: 25 mg/dL (ref 8–27)
CALCIUM: 9.5 mg/dL (ref 8.6–10.2)
CO2: 23 mmol/L (ref 18–29)
CREATININE: 1.4 mg/dL — AB (ref 0.76–1.27)
Chloride: 98 mmol/L (ref 96–106)
GFR calc Af Amer: 55 mL/min/{1.73_m2} — ABNORMAL LOW (ref >59)
GFR, EST NON AFRICAN AMERICAN: 47 mL/min/{1.73_m2} — AB (ref >59)
GLUCOSE: 165 mg/dL — AB (ref 65–99)
Potassium: 3.9 mmol/L (ref 3.5–5.2)
Sodium: 139 mmol/L (ref 134–144)

## 2016-04-12 LAB — HEMOGLOBIN A1C
Est. average glucose Bld gHb Est-mCnc: 169 mg/dL
HEMOGLOBIN A1C: 7.5 % — AB (ref 4.8–5.6)

## 2016-04-21 DIAGNOSIS — L0109 Other impetigo: Secondary | ICD-10-CM | POA: Diagnosis not present

## 2016-04-21 DIAGNOSIS — L570 Actinic keratosis: Secondary | ICD-10-CM | POA: Diagnosis not present

## 2016-04-21 DIAGNOSIS — Z85828 Personal history of other malignant neoplasm of skin: Secondary | ICD-10-CM | POA: Diagnosis not present

## 2016-04-21 DIAGNOSIS — Z859 Personal history of malignant neoplasm, unspecified: Secondary | ICD-10-CM | POA: Diagnosis not present

## 2016-05-28 DIAGNOSIS — R69 Illness, unspecified: Secondary | ICD-10-CM | POA: Diagnosis not present

## 2016-06-20 ENCOUNTER — Encounter: Payer: Self-pay | Admitting: Internal Medicine

## 2016-06-20 ENCOUNTER — Ambulatory Visit (INDEPENDENT_AMBULATORY_CARE_PROVIDER_SITE_OTHER): Payer: Medicare HMO | Admitting: Internal Medicine

## 2016-06-20 VITALS — BP 92/60 | HR 60 | Ht 74.0 in | Wt 202.0 lb

## 2016-06-20 DIAGNOSIS — E1121 Type 2 diabetes mellitus with diabetic nephropathy: Secondary | ICD-10-CM | POA: Diagnosis not present

## 2016-06-20 DIAGNOSIS — N183 Chronic kidney disease, stage 3 (moderate): Secondary | ICD-10-CM | POA: Diagnosis not present

## 2016-06-20 DIAGNOSIS — I10 Essential (primary) hypertension: Secondary | ICD-10-CM

## 2016-06-20 DIAGNOSIS — R0981 Nasal congestion: Secondary | ICD-10-CM

## 2016-06-20 DIAGNOSIS — E1122 Type 2 diabetes mellitus with diabetic chronic kidney disease: Secondary | ICD-10-CM | POA: Diagnosis not present

## 2016-06-20 MED ORDER — METFORMIN HCL ER 500 MG PO TB24: 500.0000 mg | ORAL_TABLET | Freq: Every day | ORAL | 5 refills | Status: DC

## 2016-06-20 MED ORDER — FLUTICASONE PROPIONATE 50 MCG/ACT NA SUSP: 2.0000 | Freq: Every day | NASAL | 6 refills | Status: DC

## 2016-06-20 NOTE — Patient Instructions (Addendum)
Check blood sugar fasting and 2 hours after evening meal.

## 2016-06-20 NOTE — Progress Notes (Signed)
Date:  06/20/2016   Name:  Maxwell Stanley   DOB:  07/01/1934   MRN:  [id number]   Chief Complaint: Diabetes (Number have been rising in last month. The other night was in 300's. ); Sinusitis (X 1 month. Drainage and stopped up. Bad taste. No color in production. Taking OTC Metamusal.  ); and Hypertension (Lower today. ) Diabetes  He presents for his follow-up diabetic visit. He has type 2 diabetes mellitus. Pertinent negatives for hypoglycemia include no headaches or tremors. Pertinent negatives for diabetes include no chest pain, no fatigue, no polydipsia and no polyuria. Symptoms are worsening. Diabetic complications include nephropathy. Current diabetic treatments: Januvia. He is compliant with treatment all of the time. His weight is stable. His home blood glucose trend is increasing steadily. His breakfast blood glucose is taken between 8-9 am. His breakfast blood glucose range is generally >200 mg/dl. An ACE inhibitor/angiotensin II receptor blocker is being taken.  Hypertension  This is a chronic problem. The problem is controlled. Pertinent negatives include no chest pain, headaches, palpitations or shortness of breath. Hypertensive end-organ damage includes kidney disease.  Sinusitis  Associated symptoms include congestion. Pertinent negatives include no chills, coughing, headaches, shortness of breath, sinus pressure or sore throat.  Stage 3 CKD - last GFR 47 down from 55.  Lab Results  Component Value Date   HGBA1C 7.5 (H) 04/11/2016   Lab Results  Component Value Date   CREATININE 1.40 (H) 04/11/2016     Review of Systems  Constitutional: Negative for appetite change, chills, fatigue and unexpected weight change.  HENT: Positive for congestion and postnasal drip. Negative for sinus pressure and sore throat.   Eyes: Negative for visual disturbance.  Respiratory: Negative for cough, shortness of breath and wheezing.   Cardiovascular: Negative for chest pain, palpitations  and leg swelling.  Gastrointestinal: Negative for abdominal pain and blood in stool.  Endocrine: Negative for polydipsia and polyuria.  Genitourinary: Negative for dysuria and hematuria.  Skin: Negative for color change and rash.  Neurological: Negative for tremors, numbness and headaches.  Psychiatric/Behavioral: Negative for dysphoric mood.    Patient Active Problem List   Diagnosis Date Noted  . Carpal tunnel syndrome on left 12/12/2015  . Chronic maxillary sinusitis 04/09/2015  . Type 2 diabetes mellitus with diabetic nephropathy, without long-term current use of insulin ([place]) 11/28/2014  . LBP (low back pain) 09/21/2014  . Lumbar canal stenosis 09/21/2014  . Abnormal prostate specific antigen 04/17/2014  . Enlarged prostate with lower urinary tract symptoms (LUTS) 04/17/2014  . CKD stage 3 secondary to diabetes ([place]) 04/17/2014  . Dyslipidemia 04/17/2014  . 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 Essential (primary) hypertension 04/17/2014  . Esophagitis, reflux 04/17/2014  . Back muscle spasm 04/17/2014  . Degenerative arthritis of finger 04/17/2014  . Foot pain, left 04/17/2014  . Breast development in males 04/17/2014  . 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 Arthritis of knee, degenerative 11/03/2013  . EDEMA 08/24/2009    Prior to Admission medications   Medication Sig Start Date End Date Taking? Authorizing Provider  aspirin 81 MG tablet Take 81 mg by mouth daily.      [provider]  atenolol (TENORMIN) 50 MG tablet Take 1 tablet (50 mg total) by mouth at bedtime. 12/12/15   Glean Hess, MD  baclofen (LIORESAL) 10 MG tablet Take 1 tablet (10 mg total) by mouth at bedtime. 12/29/14   Glean Hess, MD  cholecalciferol (VITAMIN D) 1000  UNITS tablet Take 1 tablet by mouth daily.    [provider]  docusate sodium (COLACE) 100 MG capsule Take by mouth.    [provider]  glucose blood test strip 1 each 2 (two) times daily.  06/24/13   [provider]  losartan-hydrochlorothiazide (HYZAAR) 100-25 MG tablet Take 1 tablet by mouth daily. 12/12/15   Glean Hess, MD  Misc Natural Products (OSTEO BI-FLEX TRIPLE STRENGTH) TABS Take 2 tablets by mouth daily.    [provider]  Multiple Vitamins-Minerals (CENTRUM SILVER) tablet Take 1 tablet by mouth daily.      [provider]  mupirocin ointment (BACTROBAN) 2 % APPLY TO AFFECTED AREA 3 TIMES A DAY 04/21/16   [provider]  Omega-3 Fatty Acids (FISH OIL) 1000 MG CAPS Take 1 capsule by mouth daily at 2 PM. Reported on 12/29/2014    [provider]  pantoprazole (PROTONIX) 40 MG tablet Take 1 tablet (40 mg total) by mouth daily. 12/12/15   Glean Hess, MD  Probiotic Product (PROBIOTIC & ACIDOPHILUS EX ST) CAPS Take 1 capsule by mouth daily at 2 PM daily at 2 PM.    [provider]  rosuvastatin (CRESTOR) 40 MG tablet Take 1 tablet (40 mg total) by mouth at bedtime. 01/17/16   Glean Hess, MD  sitaGLIPtin (JANUVIA) 100 MG tablet Take 1 tablet (100 mg total) by mouth daily. 01/21/16   Glean Hess, MD  tadalafil (CIALIS) 20 MG tablet Take 20 mg by mouth daily.      [provider]  tamsulosin (FLOMAX) 0.4 MG CAPS capsule Take 1 capsule by mouth daily at 2 PM daily at 2 PM.    [provider]  TOLAK 4 % CREA USE 1(ONE) APPLICATION(S) TOPICAL EVERY DAY 04/22/16   [provider]    Allergies  Allergen Reactions  . Amlodipine     Other reaction(s): Edema  . Codeine   . Penicillin G Rash  . Rocephin  [Ceftriaxone] Rash    Past Surgical History:  Procedure Laterality Date  . ANKLE ARTHROSCOPY WITH OPEN REDUCTION INTERNAL FIXATION (ORIF) Left 1998  . HERNIA REPAIR Left 9509   umbilical and left inguinal  . KNEE ARTHROSCOPY Right 2006  . THUMB ARTHROSCOPY Right 2013   tendon flap to joint    Social History  Substance Use Topics  . Smoking status: Never Smoker  .  Smokeless tobacco: Never Used  . Alcohol use No     Medication list has been reviewed and updated.   Physical Exam  Constitutional: He is oriented to person, place, and time. He appears well-developed. No distress.  HENT:  Head: Normocephalic and atraumatic.  Right Ear: Tympanic membrane and ear canal normal. Tympanic membrane is not erythematous and not retracted. No middle ear effusion.  Left Ear: A middle ear effusion is present.  Cardiovascular: Normal rate, regular rhythm and normal heart sounds.   Pulmonary/Chest: Effort normal and breath sounds normal. No respiratory distress. He has no wheezes. He has no rhonchi.  Musculoskeletal: Normal range of motion.  Neurological: He is alert and oriented to person, place, and time.  Skin: Skin is warm and dry. No rash noted.  Psychiatric: He has a normal mood and affect. His speech is normal and behavior is normal. Thought content normal.  Nursing note and vitals reviewed.   BP 92/60   Pulse 60   Ht 6\' 2"  (1.88 m)   Wt 202 lb (91.6 kg)   SpO2  96%   BMI 25.94 kg/m   Assessment and Plan: 1. Essential (primary) hypertension Controlled; slightly low but asx  2. Type 2 diabetes mellitus with diabetic nephropathy, without long-term current use of insulin (HCC) Worsening - add low dose metformin to Januvia 50 mg - metFORMIN (GLUCOPHAGE-XR) 500 MG 24 hr tablet; Take 1 tablet (500 mg total) by mouth daily with breakfast.  Dispense: 30 tablet; Refill: 5  3. CKD stage 3 secondary to diabetes ([place]) Check at next visit  4. Sinus congestion Begin Flonase NS Follow up with ENT if sx are persistent   Meds ordered this encounter  Medications  . fluticasone (FLONASE) 50 MCG/ACT nasal spray    Sig: Place 2 sprays into both nostrils daily.    Dispense:  16 g    Refill:  6  . metFORMIN (GLUCOPHAGE-XR) 500 MG 24 hr tablet    Sig: Take 1 tablet (500 mg total) by mouth daily with breakfast.    Dispense:  30 tablet    Refill:  [place], MD [facility]  06/20/2016

## 2016-06-27 ENCOUNTER — Encounter: Payer: Self-pay | Admitting: Internal Medicine

## 2016-07-22 DIAGNOSIS — E1121 Type 2 diabetes mellitus with diabetic nephropathy: Secondary | ICD-10-CM | POA: Diagnosis not present

## 2016-07-22 DIAGNOSIS — E1122 Type 2 diabetes mellitus with diabetic chronic kidney disease: Secondary | ICD-10-CM | POA: Diagnosis not present

## 2016-07-22 DIAGNOSIS — M1712 Unilateral primary osteoarthritis, left knee: Secondary | ICD-10-CM | POA: Diagnosis not present

## 2016-07-22 DIAGNOSIS — M25561 Pain in right knee: Secondary | ICD-10-CM | POA: Diagnosis not present

## 2016-07-22 DIAGNOSIS — M1711 Unilateral primary osteoarthritis, right knee: Secondary | ICD-10-CM | POA: Diagnosis not present

## 2016-07-22 DIAGNOSIS — N183 Chronic kidney disease, stage 3 (moderate): Secondary | ICD-10-CM | POA: Diagnosis not present

## 2016-08-08 ENCOUNTER — Ambulatory Visit: Payer: Self-pay | Admitting: Physician Assistant

## 2016-08-08 ENCOUNTER — Encounter: Payer: Self-pay | Admitting: Family Medicine

## 2016-08-08 ENCOUNTER — Ambulatory Visit (INDEPENDENT_AMBULATORY_CARE_PROVIDER_SITE_OTHER): Payer: Medicare HMO | Admitting: Family Medicine

## 2016-08-08 VITALS — BP 103/76 | HR 81 | Resp 16 | Ht 74.0 in | Wt 196.0 lb

## 2016-08-08 DIAGNOSIS — E1122 Type 2 diabetes mellitus with diabetic chronic kidney disease: Secondary | ICD-10-CM

## 2016-08-08 DIAGNOSIS — N183 Chronic kidney disease, stage 3 unspecified: Secondary | ICD-10-CM

## 2016-08-08 DIAGNOSIS — Z79899 Other long term (current) drug therapy: Secondary | ICD-10-CM | POA: Diagnosis not present

## 2016-08-08 DIAGNOSIS — E785 Hyperlipidemia, unspecified: Secondary | ICD-10-CM | POA: Diagnosis not present

## 2016-08-08 DIAGNOSIS — E1121 Type 2 diabetes mellitus with diabetic nephropathy: Secondary | ICD-10-CM

## 2016-08-08 DIAGNOSIS — I10 Essential (primary) hypertension: Secondary | ICD-10-CM | POA: Diagnosis not present

## 2016-08-08 DIAGNOSIS — J309 Allergic rhinitis, unspecified: Secondary | ICD-10-CM | POA: Diagnosis not present

## 2016-08-08 MED ORDER — LORATADINE 10 MG PO TABS: 10.0000 mg | ORAL_TABLET | Freq: Every day | ORAL | 11 refills | Status: DC

## 2016-08-08 NOTE — Progress Notes (Signed)
Date:  08/08/2016   Name:  Maxwell Stanley   DOB:  1934/08/31   MRN:  [id number]  PCP:  Glean Hess, MD    Chief Complaint: Dizziness (off and on since starting Metformin. Worsening lately. -hx vertigo years ago, ); Hypertension (Stopped Losartan 2 days ago after lower BP. ); and Sinus Problem (1 month of sinus congestion. Given Flonase in June but not helping. Requ Abx. )   History of Present Illness:  This is a 82 y.o. male pt of Dr. Gaspar Cola seen for lightheadedness since starting metformin 6 weeks ago, better now that taking metformin at night. Worse two days ago, SBP in the 80s so held Hyzaar with improvement of SBP to 120-130 at home. Also on atenolol for "high pulse' in past, no clear cardiac hx. T2DM, last a1c in April, also CKD3 with eGFR 47 in April, last full blood work last year, has appt with Dr. [name] in 3 weeks. Also c/o persistent sinus congestion/rhinorrhea/bad taste in mouth, Flonase no help, has not tried antihistamine.  Review of Systems:  Review of Systems  Constitutional: Negative for chills and fever.  HENT: Negative for ear pain, facial swelling and sore throat.   Respiratory: Negative for cough and shortness of breath.   Cardiovascular: Negative for chest pain and leg swelling.  Neurological: Negative for syncope.    Patient Active Problem List   Diagnosis Date Noted  . Allergic rhinitis 08/08/2016  . Carpal tunnel syndrome on left 12/12/2015  . Chronic maxillary sinusitis 04/09/2015  . Type 2 diabetes mellitus with diabetic nephropathy, without long-term current use of insulin ([place]) 11/28/2014  . LBP (low back pain) 09/21/2014  . Lumbar canal stenosis 09/21/2014  . Abnormal prostate specific antigen 04/17/2014  . Enlarged prostate with lower urinary tract symptoms (LUTS) 04/17/2014  . CKD stage 3 secondary to diabetes ([place]) 04/17/2014  . Dyslipidemia 04/17/2014  . 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 Essential (primary)  hypertension 04/17/2014  . Esophagitis, reflux 04/17/2014  . Current use of proton pump inhibitor 04/17/2014  . Back muscle spasm 04/17/2014  . Degenerative arthritis of finger 04/17/2014  . Foot pain, left 04/17/2014  . Breast development in males 04/17/2014  . 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 Primary osteoarthritis of left knee 11/03/2013  . EDEMA 08/24/2009    Prior to Admission medications   Medication Sig Start Date End Date Taking? Authorizing Provider  aspirin 81 MG tablet Take 81 mg by mouth daily.     Yes [provider]  atenolol (TENORMIN) 50 MG tablet Take 1 tablet (50 mg total) by mouth at bedtime. 12/12/15  Yes Glean Hess, MD  baclofen (LIORESAL) 10 MG tablet Take 1 tablet (10 mg total) by mouth at bedtime. 12/29/14  Yes Glean Hess, MD  cholecalciferol (VITAMIN D) 1000 UNITS tablet Take 1 tablet by mouth daily.   Yes [provider]  docusate sodium (COLACE) 100 MG capsule Take by mouth.   Yes [provider]  glucose blood test strip 1 each 2 (two) times daily. 06/24/13  Yes [provider]  metFORMIN (GLUCOPHAGE) 500 MG tablet Take by mouth.   Yes [provider]  Misc Natural Products (OSTEO BI-FLEX TRIPLE STRENGTH) TABS Take 2 tablets by mouth daily.   Yes [provider]  Multiple Vitamins-Minerals (CENTRUM SILVER) tablet Take 1 tablet by mouth daily.     Yes [provider]  mupirocin ointment (BACTROBAN) 2 % APPLY TO AFFECTED AREA 3 TIMES A  DAY 04/21/16  Yes [provider]  Omega-3 Fatty Acids (FISH OIL) 1000 MG CAPS Take 1 capsule by mouth daily at 2 PM. Reported on 12/29/2014   Yes [provider]  pantoprazole (PROTONIX) 40 MG tablet Take 1 tablet (40 mg total) by mouth daily. 12/12/15  Yes Glean Hess, MD  Probiotic Product (PROBIOTIC & ACIDOPHILUS EX ST) CAPS Take 1 capsule by mouth daily at 2 PM daily at 2 PM.   Yes [provider]  rosuvastatin (CRESTOR) 40 MG  tablet Take 1 tablet (40 mg total) by mouth at bedtime. 01/17/16  Yes Glean Hess, MD  sitaGLIPtin (JANUVIA) 100 MG tablet Take 1 tablet (100 mg total) by mouth daily. 01/21/16  Yes Glean Hess, MD  tadalafil (CIALIS) 20 MG tablet Take 20 mg by mouth daily.     Yes [provider]  tamsulosin (FLOMAX) 0.4 MG CAPS capsule Take 1 capsule by mouth daily at 2 PM daily at 2 PM.   Yes [provider]  TOLAK 4 % CREA USE 1(ONE) APPLICATION(S) TOPICAL EVERY DAY 04/22/16  Yes [provider]  loratadine (CLARITIN) 10 MG tablet Take 1 tablet (10 mg total) by mouth daily. 08/08/16   Adline Potter, MD    Allergies  Allergen Reactions  . Amlodipine     Other reaction(s): Edema  . Codeine   . Penicillin G Rash  . Rocephin  [Ceftriaxone] Rash    Past Surgical History:  Procedure Laterality Date  . ANKLE ARTHROSCOPY WITH OPEN REDUCTION INTERNAL FIXATION (ORIF) Left 1998  . HERNIA REPAIR Left 4097   umbilical and left inguinal  . KNEE ARTHROSCOPY Right 2006  . THUMB ARTHROSCOPY Right 2013   tendon flap to joint    Social History  Substance Use Topics  . Smoking status: Never Smoker  . Smokeless tobacco: Never Used  . Alcohol use No    Family History  Problem Relation Age of Onset  . Diabetes Mother   . Coronary artery disease Father   . Hypertension Father   . Hyperlipidemia Father     Medication list has been reviewed and updated.  Physical Examination: BP 103/76   Pulse 81   Resp 16   Ht _0  (1.88 m)   Wt 196 lb (88.9 kg)   SpO2 98%   BMI 25.16 kg/m   Physical Exam  Constitutional: He appears well-developed and well-nourished.  Cardiovascular: Normal rate, regular rhythm and normal heart sounds.   Pulmonary/Chest: Effort normal and breath sounds normal.  Musculoskeletal: He exhibits no edema.  Neurological: He is alert.  Hallpike negative  Skin: Skin is warm and dry.  Psychiatric: He has a normal mood and affect. His behavior is  normal.  Nursing note and vitals reviewed.   Assessment and Plan:  1. Essential (primary) hypertension Overcontrolled, stop Hyzaar, cont atenolol for now - Comprehensive Metabolic Panel (CMET) - CBC  2. Allergic rhinitis, unspecified seasonality, unspecified trigger Trial OTC Claritin, no clear indication for abx  3. Type 2 diabetes mellitus with diabetic nephropathy, without long-term current use of insulin ([facility]) Unclear control on Januvia qam, metformin qhs, consider low dose ACEI if MCR elevated - HgB A1c - Urine Microalbumin w/creat. ratio  4. CKD stage 3 secondary to diabetes ([place]) Recheck today  5. Dyslipidemia On [place] - Lipid Profile  6. Current use of proton pump inhibitor - B12  Return in about 3 weeks (around 08/29/2016) for as scheduled with DrMarland Kitchen [name].  Satira Anis. Annaliyah Willig, Jr.  MD [facility]  08/08/2016

## 2016-08-09 LAB — MICROALBUMIN / CREATININE URINE RATIO
CREATININE, UR: 172.6 mg/dL
Microalb/Creat Ratio: 2.9 mg/g creat (ref 0.0–30.0)
Microalbumin, Urine: 5 ug/mL

## 2016-08-09 LAB — COMPREHENSIVE METABOLIC PANEL
A/G RATIO: 1.9 (ref 1.2–2.2)
ALK PHOS: 78 IU/L (ref 39–117)
ALT: 20 IU/L (ref 0–44)
AST: 21 IU/L (ref 0–40)
Albumin: 4.5 g/dL (ref 3.5–4.8)
BUN/Creatinine Ratio: 16 (ref 10–24)
BUN: 23 mg/dL (ref 8–27)
Bilirubin Total: 0.6 mg/dL (ref 0.0–1.2)
CO2: 22 mmol/L (ref 20–29)
Calcium: 9.8 mg/dL (ref 8.6–10.2)
Chloride: 102 mmol/L (ref 96–106)
Creatinine, Ser: 1.4 mg/dL — ABNORMAL HIGH (ref 0.76–1.27)
GFR calc Af Amer: 55 mL/min/{1.73_m2} — ABNORMAL LOW (ref >59)
GFR calc non Af Amer: 47 mL/min/{1.73_m2} — ABNORMAL LOW (ref >59)
GLOBULIN, TOTAL: 2.4 g/dL (ref 1.5–4.5)
Glucose: 122 mg/dL — ABNORMAL HIGH (ref 65–99)
POTASSIUM: 4.8 mmol/L (ref 3.5–5.2)
SODIUM: 142 mmol/L (ref 134–144)
Total Protein: 6.9 g/dL (ref 6.0–8.5)

## 2016-08-09 LAB — CBC
HEMOGLOBIN: 14.8 g/dL (ref 13.0–17.7)
Hematocrit: 42.5 % (ref 37.5–51.0)
MCH: 32.5 pg (ref 26.6–33.0)
MCHC: 34.8 g/dL (ref 31.5–35.7)
MCV: 93 fL (ref 79–97)
PLATELETS: 192 10*3/uL (ref 150–379)
RBC: 4.56 x10E6/uL (ref 4.14–5.80)
RDW: 14.4 % (ref 12.3–15.4)
WBC: 6.1 10*3/uL (ref 3.4–10.8)

## 2016-08-09 LAB — LIPID PANEL
CHOLESTEROL TOTAL: 145 mg/dL (ref 100–199)
Chol/HDL Ratio: 4.5 ratio (ref 0.0–5.0)
HDL: 32 mg/dL — ABNORMAL LOW (ref >39)
LDL Calculated: 62 mg/dL (ref 0–99)
TRIGLYCERIDES: 253 mg/dL — AB (ref 0–149)
VLDL CHOLESTEROL CAL: 51 mg/dL — AB (ref 5–40)

## 2016-08-09 LAB — HEMOGLOBIN A1C
Est. average glucose Bld gHb Est-mCnc: 166 mg/dL
Hgb A1c MFr Bld: 7.4 % — ABNORMAL HIGH (ref 4.8–5.6)

## 2016-08-09 LAB — VITAMIN B12

## 2016-08-11 ENCOUNTER — Telehealth: Payer: Self-pay

## 2016-08-11 NOTE — Telephone Encounter (Signed)
Patient called stating he seen Dr Vicente Masson for OV about BP control. Dr. Vicente Masson took patient off of Losartan- HCTZ. After stopping this medication for 2 days patient starting having feelings of hearing and feeling his pulse in his head and neck- felt like BP was rising. Been checking his pressure and its been running higher for him with systolic in 943'E and diastolic is 76-14'J. He tried taking a half a tablet of this medication and feels back to normal and BP is now at a 135/72 while on the phone with me. Spoke with Dr [name] and she stated for patient to continue to take 1/2 a tablet daily. Pt verbalized understanding.

## 2016-08-13 ENCOUNTER — Encounter: Payer: Medicare HMO | Admitting: Internal Medicine

## 2016-08-18 ENCOUNTER — Ambulatory Visit: Payer: Medicare HMO

## 2016-08-25 ENCOUNTER — Ambulatory Visit: Payer: Medicare HMO

## 2016-08-25 VITALS — BP 130/80 | HR 60 | Temp 97.9°F | Resp 16 | Ht 74.0 in | Wt 206.2 lb

## 2016-08-25 DIAGNOSIS — Z0000 Encounter for general adult medical examination without abnormal findings: Secondary | ICD-10-CM

## 2016-08-25 NOTE — Patient Instructions (Signed)
Maxwell Stanley , Thank you for taking time to come for your Medicare Wellness Visit. I appreciate your ongoing commitment to your health goals. Please review the following plan we discussed and let me know if I can assist you in the future.   Screening recommendations/referrals: Colonoscopy: completed 10/23/2010 Recommended yearly ophthalmology/optometry visit for glaucoma screening and checkup Recommended yearly dental visit for hygiene and checkup  Vaccinations: Influenza vaccine: up to date, due 09/2016 Pneumococcal vaccine: up to date Tdap vaccine: up to date Shingles vaccine: up to date  Advanced directives: Please bring a copy of your health care power of attorney and living will to the office at your convenience.  Conditions/risks identified: Recommend drinking at least 3-4 glasses of water a day  Next appointment: Follow up on 08/06/2016 at 8:00am with Dr.Berglund. Follow up in one year for your annual wellness exam.  Preventive Care 65 Years and Older, Male Preventive care refers to lifestyle choices and visits with your health care provider that can promote health and wellness. What does preventive care include?  A yearly physical exam. This is also called an annual well check.  Dental exams once or twice a year.  Routine eye exams. Ask your health care provider how often you should have your eyes checked.  Personal lifestyle choices, including:  Daily care of your teeth and gums.  Regular physical activity.  Eating a healthy diet.  Avoiding tobacco and drug use.  Limiting alcohol use.  Practicing safe sex.  Taking low doses of aspirin every day.  Taking vitamin and mineral supplements as recommended by your health care provider. What happens during an annual well check? The services and screenings done by your health care provider during your annual well check will depend on your age, overall health, lifestyle risk factors, and family history of  disease. Counseling  Your health care provider may ask you questions about your:  Alcohol use.  Tobacco use.  Drug use.  Emotional well-being.  Home and relationship well-being.  Sexual activity.  Eating habits.  History of falls.  Memory and ability to understand (cognition).  Work and work Statistician. Screening  You may have the following tests or measurements:  Height, weight, and BMI.  Blood pressure.  Lipid and cholesterol levels. These may be checked every 5 years, or more frequently if you are over 13 years old.  Skin check.  Lung cancer screening. You may have this screening every year starting at age 33 if you have a 30-pack-year history of smoking and currently smoke or have quit within the past 15 years.  Fecal occult blood test (FOBT) of the stool. You may have this test every year starting at age 50.  Flexible sigmoidoscopy or colonoscopy. You may have a sigmoidoscopy every 5 years or a colonoscopy every 10 years starting at age 31.  Prostate cancer screening. Recommendations will vary depending on your family history and other risks.  Hepatitis C blood test.  Hepatitis B blood test.  Sexually transmitted disease (STD) testing.  Diabetes screening. This is done by checking your blood sugar (glucose) after you have not eaten for a while (fasting). You may have this done every 1-3 years.  Abdominal aortic aneurysm (AAA) screening. You may need this if you are a current or former smoker.  Osteoporosis. You may be screened starting at age 63 if you are at high risk. Talk with your health care provider about your test results, treatment options, and if necessary, the need for more tests. Vaccines  Your health care provider may recommend certain vaccines, such as:  Influenza vaccine. This is recommended every year.  Tetanus, diphtheria, and acellular pertussis (Tdap, Td) vaccine. You may need a Td booster every 10 years.  Zoster vaccine. You may  need this after age 54.  Pneumococcal 13-valent conjugate (PCV13) vaccine. One dose is recommended after age 45.  Pneumococcal polysaccharide (PPSV23) vaccine. One dose is recommended after age 101. Talk to your health care provider about which screenings and vaccines you need and how often you need them. This information is not intended to replace advice given to you by your health care provider. Make sure you discuss any questions you have with your health care provider. Document Released: 01/19/2015 Document Revised: 09/12/2015 Document Reviewed: 10/24/2014 Elsevier Interactive Patient Education  2017 [place] Prevention in the Home Falls can cause injuries. They can happen to people of all ages. There are many things you can do to make your home safe and to help prevent falls. What can I do on the outside of my home?  Regularly fix the edges of walkways and driveways and fix any cracks.  Remove anything that might make you trip as you walk through a door, such as a raised step or threshold.  Trim any bushes or trees on the path to your home.  Use bright outdoor lighting.  Clear any walking paths of anything that might make someone trip, such as rocks or tools.  Regularly check to see if handrails are loose or broken. Make sure that both sides of any steps have handrails.  Any raised decks and porches should have guardrails on the edges.  Have any leaves, snow, or ice cleared regularly.  Use sand or salt on walking paths during winter.  Clean up any spills in your garage right away. This includes oil or grease spills. What can I do in the bathroom?  Use night lights.  Install grab bars by the toilet and in the tub and shower. Do not use towel bars as grab bars.  Use non-skid mats or decals in the tub or shower.  If you need to sit down in the shower, use a plastic, non-slip stool.  Keep the floor dry. Clean up any water that spills on the floor as soon as it  happens.  Remove soap buildup in the tub or shower regularly.  Attach bath mats securely with double-sided non-slip rug tape.  Do not have throw rugs and other things on the floor that can make you trip. What can I do in the bedroom?  Use night lights.  Make sure that you have a light by your bed that is easy to reach.  Do not use any sheets or blankets that are too big for your bed. They should not hang down onto the floor.  Have a firm chair that has side arms. You can use this for support while you get dressed.  Do not have throw rugs and other things on the floor that can make you trip. What can I do in the kitchen?  Clean up any spills right away.  Avoid walking on wet floors.  Keep items that you use a lot in easy-to-reach places.  If you need to reach something above you, use a strong step stool that has a grab bar.  Keep electrical cords out of the way.  Do not use floor polish or wax that makes floors slippery. If you must use wax, use non-skid floor wax.  Do  not have throw rugs and other things on the floor that can make you trip. What can I do with my stairs?  Do not leave any items on the stairs.  Make sure that there are handrails on both sides of the stairs and use them. Fix handrails that are broken or loose. Make sure that handrails are as long as the stairways.  Check any carpeting to make sure that it is firmly attached to the stairs. Fix any carpet that is loose or worn.  Avoid having throw rugs at the top or bottom of the stairs. If you do have throw rugs, attach them to the floor with carpet tape.  Make sure that you have a light switch at the top of the stairs and the bottom of the stairs. If you do not have them, ask someone to add them for you. What else can I do to help prevent falls?  Wear shoes that:  Do not have high heels.  Have rubber bottoms.  Are comfortable and fit you well.  Are closed at the toe. Do not wear sandals.  If you  use a stepladder:  Make sure that it is fully opened. Do not climb a closed stepladder.  Make sure that both sides of the stepladder are locked into place.  Ask someone to hold it for you, if possible.  Clearly mark and make sure that you can see:  Any grab bars or handrails.  First and last steps.  Where the edge of each step is.  Use tools that help you move around (mobility aids) if they are needed. These include:  Canes.  Walkers.  Scooters.  Crutches.  Turn on the lights when you go into a dark area. Replace any light bulbs as soon as they burn out.  Set up your furniture so you have a clear path. Avoid moving your furniture around.  If any of your floors are uneven, fix them.  If there are any pets around you, be aware of where they are.  Review your medicines with your doctor. Some medicines can make you feel dizzy. This can increase your chance of falling. Ask your doctor what other things that you can do to help prevent falls. This information is not intended to replace advice given to you by your health care provider. Make sure you discuss any questions you have with your health care provider. Document Released: 10/19/2008 Document Revised: 05/31/2015 Document Reviewed: 01/27/2014 Elsevier Interactive Patient Education  2017 [employer].

## 2016-08-25 NOTE — Progress Notes (Signed)
Subjective:   Maxwell Stanley is a 82 y.o. male who presents for Medicare Annual/Subsequent preventive examination.  Review of Systems:  Cardiac Risk Factors include: advanced age (>20men, >37 women);hypertension;male gender;diabetes mellitus;dyslipidemia     Objective:    Vitals: BP 130/80 (BP Location: Right Arm, Patient Position: Sitting)   Pulse 60   Temp 97.9 F (36.6 C)   Resp 16   Ht 6\' 2"  (1.88 m)   Wt 206 lb 3.2 oz (93.5 kg)   BMI 26.47 kg/m   Body mass index is 26.47 kg/m.  Tobacco History  Smoking Status  . Never Smoker  Smokeless Tobacco  . Never Used     Counseling given: Not Answered   Past Medical History:  Diagnosis Date  . BPH (benign prostatic hypertrophy)    with BOO  . Diabetes mellitus without complication ([place])   . Nocturia   . Organic impotence   . Urinary frequency    Past Surgical History:  Procedure Laterality Date  . ANKLE ARTHROSCOPY WITH OPEN REDUCTION INTERNAL FIXATION (ORIF) Left 1998  . HERNIA REPAIR Left 7616   umbilical and left inguinal  . KNEE ARTHROSCOPY Right 2006  . THUMB ARTHROSCOPY Right 2013   tendon flap to joint   Family History  Problem Relation Age of Onset  . Diabetes Mother   . Coronary artery disease Father   . Hypertension Father   . Hyperlipidemia Father    History  Sexual Activity  . Sexual activity: Not on file    Outpatient Encounter Prescriptions as of 08/25/2016  Medication Sig  . acetaminophen (TYLENOL) 500 MG tablet Take 500 mg by mouth every 6 (six) hours as needed.  [name] aspirin 81 MG tablet Take 81 mg by mouth daily.    [name] atenolol (TENORMIN) 50 MG tablet Take 1 tablet (50 mg total) by mouth at bedtime.  . cholecalciferol (VITAMIN D) 1000 UNITS tablet Take 1 tablet by mouth daily.  [name] docusate sodium (COLACE) 100 MG capsule Take by mouth.  [name] glucose blood test strip 1 each 2 (two) times daily.  [name] loratadine (CLARITIN) 10 MG tablet Take 1 tablet (10 mg total) by mouth daily.  [name]  losartan-hydrochlorothiazide (HYZAAR) 100-25 MG tablet Take 1 tablet by mouth daily. Take 1/2 tablet daily  . metFORMIN (GLUCOPHAGE) 500 MG tablet Take by mouth.  . Misc Natural Products (OSTEO BI-FLEX TRIPLE STRENGTH) TABS Take 2 tablets by mouth daily.  . Multiple Vitamins-Minerals (CENTRUM SILVER) tablet Take 1 tablet by mouth daily.    . mupirocin ointment (BACTROBAN) 2 % APPLY TO AFFECTED AREA 3 TIMES A DAY  . Omega-3 Fatty Acids (FISH OIL) 1000 MG CAPS Take 1 capsule by mouth daily at 2 PM. Reported on 12/29/2014  . pantoprazole (PROTONIX) 40 MG tablet Take 1 tablet (40 mg total) by mouth daily.  . Probiotic Product (PROBIOTIC & ACIDOPHILUS EX ST) CAPS Take 1 capsule by mouth daily at 2 PM daily at 2 PM.  . rosuvastatin (CRESTOR) 40 MG tablet Take 1 tablet (40 mg total) by mouth at bedtime.  . sitaGLIPtin (JANUVIA) 100 MG tablet Take 1 tablet (100 mg total) by mouth daily.  . tadalafil (CIALIS) 20 MG tablet Take 20 mg by mouth daily.    . tamsulosin (FLOMAX) 0.4 MG CAPS capsule Take 1 capsule by mouth daily at 2 PM daily at 2 PM.  . TOLAK 4 % CREA USE 1(ONE) APPLICATION(S) TOPICAL EVERY DAY  . [DISCONTINUED] baclofen (LIORESAL) 10 MG tablet Take 1 tablet (  10 mg total) by mouth at bedtime. (Patient not taking: Reported on 08/25/2016)   No facility-administered encounter medications on file as of 08/25/2016.     Activities of Daily Living In your present state of health, do you have any difficulty performing the following activities: 08/25/2016  Hearing? N  Vision? N  Difficulty concentrating or making decisions? N  Walking or climbing stairs? Y  Comment due to knee  Dressing or bathing? N  Doing errands, shopping? N  Preparing Food and eating ? N  Using the Toilet? N  In the past six months, have you accidently leaked urine? N  Do you have problems with loss of bowel control? N  Managing your Medications? N  Managing your Finances? N  Housekeeping or managing your Housekeeping? N    Some recent data might be hidden    Patient Care Team: Glean Hess, MD as PCP - General (Family Medicine) Murrell Redden, MD (Urology) Jannet Mantis, MD (Dermatology)   Assessment:     Exercise Activities and Dietary recommendations Current Exercise Habits: Home exercise routine, Type of exercise: walking, Time (Minutes): 30, Frequency (Times/Week): 7, Weekly Exercise (Minutes/Week): 210, Intensity: Mild  Goals    . Increase water intake          Recommend drinking at least 3-4 glasses of water a day    . Weight (lb) < 200 lb (90.7 kg)          Wants  To lose some weight after gaining over the summer.       Fall Risk Fall Risk  08/25/2016 08/13/2015 08/13/2015 11/28/2014 07/28/2014  Falls in the past year? No No No Yes No  Number falls in past yr: - - - 1 -  Injury with Fall? - - - No -  Follow up - - - Falls prevention discussed -   Depression Screen PHQ 2/9 Scores 08/25/2016 08/13/2015 08/13/2015 11/28/2014  PHQ - 2 Score 0 0 0 0    Cognitive Function MMSE - Mini Mental State Exam 08/13/2015  Orientation to time 5  Orientation to Place 5  Registration 3  Attention/ Calculation 5  Recall 3  Language- name 2 objects 2     6CIT Screen 08/25/2016  What Year? 0 points  What month? 0 points  What time? 0 points  Count back from 20 0 points  Months in reverse 0 points  Repeat phrase 0 points  Total Score 0    Immunization History  Administered Date(s) Administered  . Influenza,inj,Quad PF,36+ Mos 09/26/2014  . Pneumococcal Conjugate-13 10/26/2013  . Pneumococcal Polysaccharide-23 01/31/2011  . Tdap 10/03/2010  . Zoster 04/16/2000   Screening Tests Health Maintenance  Topic Date Due  . FOOT EXAM  08/12/2016  . INFLUENZA VACCINE  10/06/2016 (Originally 08/06/2016)  . OPHTHALMOLOGY EXAM  08/27/2016  . HEMOGLOBIN A1C  02/08/2017  . URINE MICROALBUMIN  08/08/2017  . TETANUS/TDAP  10/02/2020  . PNA vac Low Risk Adult  Completed      Plan:    I have  personally reviewed and addressed the Medicare Annual Wellness questionnaire and have noted the following in the patient's chart:  A. Medical and social history B. Use of alcohol, tobacco or illicit drugs  C. Current medications and supplements D. Functional ability and status E.  Nutritional status F.  Physical activity G. Advance directives H. List of other physicians I.  Hospitalizations, surgeries, and ER visits in previous 12 months J.  Vitals K. Screenings such as hearing  and vision if needed, cognitive and depression L. Referrals and appointments   In addition, I have reviewed and discussed with patient certain preventive protocols, quality metrics, and best practice recommendations. A written personalized care plan for preventive services as well as general preventive health recommendations were provided to patient.   Signed,  Tyler Aas, LPN Nurse Health Advisor   MD Recommendations:

## 2016-08-26 ENCOUNTER — Encounter: Payer: Self-pay | Admitting: Internal Medicine

## 2016-08-26 ENCOUNTER — Ambulatory Visit (INDEPENDENT_AMBULATORY_CARE_PROVIDER_SITE_OTHER): Payer: Medicare HMO | Admitting: Internal Medicine

## 2016-08-26 VITALS — BP 124/80 | HR 61 | Ht 74.0 in | Wt 205.6 lb

## 2016-08-26 DIAGNOSIS — I10 Essential (primary) hypertension: Secondary | ICD-10-CM | POA: Diagnosis not present

## 2016-08-26 DIAGNOSIS — E1122 Type 2 diabetes mellitus with diabetic chronic kidney disease: Secondary | ICD-10-CM

## 2016-08-26 DIAGNOSIS — E1121 Type 2 diabetes mellitus with diabetic nephropathy: Secondary | ICD-10-CM

## 2016-08-26 DIAGNOSIS — N183 Chronic kidney disease, stage 3 unspecified: Secondary | ICD-10-CM

## 2016-08-26 NOTE — Progress Notes (Signed)
Date:  08/26/2016   Name:  Maxwell Stanley   DOB:  26-Mar-1934   MRN:  [id number]   Chief Complaint: Diabetes (Yesterday was 149- BS. Running better since been on metformin. Taking januvia in morning and metformin at night. Metformin caused dizziness in the morning. )  Diabetes  He presents for his follow-up diabetic visit. He has type 2 diabetes mellitus. Pertinent negatives for hypoglycemia include no headaches or tremors. Pertinent negatives for diabetes include no chest pain, no fatigue, no polydipsia and no polyuria.  Hypertension  This is a chronic problem. The problem has been waxing and waning since onset. Pertinent negatives include no chest pain, headaches, palpitations or shortness of breath. Past treatments include angiotensin blockers and diuretics (had to cut hyzaar in half). Hypertensive end-organ damage includes kidney disease.   Lab Results  Component Value Date   HGBA1C 7.4 (H) 08/08/2016   Lab Results  Component Value Date   CREATININE 1.40 (H) 08/08/2016     Review of Systems  Constitutional: Negative for appetite change, fatigue and unexpected weight change.  Eyes: Negative for visual disturbance.  Respiratory: Negative for cough, shortness of breath and wheezing.   Cardiovascular: Negative for chest pain, palpitations and leg swelling.  Gastrointestinal: Negative for abdominal pain and blood in stool.  Endocrine: Negative for polydipsia and polyuria.  Genitourinary: Negative for dysuria and hematuria.  Skin: Negative for color change and rash.  Neurological: Negative for tremors, numbness and headaches.  Psychiatric/Behavioral: Negative for dysphoric mood.    Patient Active Problem List   Diagnosis Date Noted  . Allergic rhinitis 08/08/2016  . Carpal tunnel syndrome on left 12/12/2015  . Chronic maxillary sinusitis 04/09/2015  . Type 2 diabetes mellitus with diabetic nephropathy, without long-term current use of insulin ([place]) 11/28/2014  . LBP (low  back pain) 09/21/2014  . Lumbar canal stenosis 09/21/2014  . Abnormal prostate specific antigen 04/17/2014  . Enlarged prostate with lower urinary tract symptoms (LUTS) 04/17/2014  . CKD stage 3 secondary to diabetes ([place]) 04/17/2014  . Dyslipidemia 04/17/2014  . 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 Essential (primary) hypertension 04/17/2014  . Esophagitis, reflux 04/17/2014  . Current use of proton pump inhibitor 04/17/2014  . Back muscle spasm 04/17/2014  . Degenerative arthritis of finger 04/17/2014  . Foot pain, left 04/17/2014  . Breast development in males 04/17/2014  . 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 Primary osteoarthritis of left knee 11/03/2013  . EDEMA 08/24/2009    Prior to Admission medications   Medication Sig Start Date End Date Taking? Authorizing Provider  acetaminophen (TYLENOL) 500 MG tablet Take 500 mg by mouth every 6 (six) hours as needed.   Yes [provider]  aspirin 81 MG tablet Take 81 mg by mouth daily.     Yes [provider]  atenolol (TENORMIN) 50 MG tablet Take 1 tablet (50 mg total) by mouth at bedtime. 12/12/15  Yes Glean Hess, MD  cholecalciferol (VITAMIN D) 1000 UNITS tablet Take 1 tablet by mouth daily.   Yes [provider]  docusate sodium (COLACE) 100 MG capsule Take by mouth.   Yes [provider]  glucose blood test strip 1 each 2 (two) times daily. 06/24/13  Yes [provider]  loratadine (CLARITIN) 10 MG tablet Take 1 tablet (10 mg total) by mouth daily. 08/08/16  Yes Plonk, Gwyndolyn Saxon, MD  losartan-hydrochlorothiazide (HYZAAR) 100-25 MG tablet Take 1 tablet by mouth daily. Take 1/2 tablet daily   Yes  [provider]  metFORMIN (GLUCOPHAGE) 500 MG tablet Take by mouth.   Yes [provider]  Misc Natural Products (OSTEO BI-FLEX TRIPLE STRENGTH) TABS Take 2 tablets by mouth daily.   Yes [provider]  Multiple Vitamins-Minerals (CENTRUM SILVER) tablet  Take 1 tablet by mouth daily.     Yes [provider]  mupirocin ointment (BACTROBAN) 2 % APPLY TO AFFECTED AREA 3 TIMES A DAY 04/21/16  Yes [provider]  Omega-3 Fatty Acids (FISH OIL) 1000 MG CAPS Take 1 capsule by mouth daily at 2 PM. Reported on 12/29/2014   Yes [provider]  pantoprazole (PROTONIX) 40 MG tablet Take 1 tablet (40 mg total) by mouth daily. 12/12/15  Yes Glean Hess, MD  Probiotic Product (PROBIOTIC & ACIDOPHILUS EX ST) CAPS Take 1 capsule by mouth daily at 2 PM daily at 2 PM.   Yes [provider]  rosuvastatin (CRESTOR) 40 MG tablet Take 1 tablet (40 mg total) by mouth at bedtime. 01/17/16  Yes Glean Hess, MD  sitaGLIPtin (JANUVIA) 100 MG tablet Take 1 tablet (100 mg total) by mouth daily. 01/21/16  Yes Glean Hess, MD  tadalafil (CIALIS) 20 MG tablet Take 20 mg by mouth daily.     Yes [provider]  tamsulosin (FLOMAX) 0.4 MG CAPS capsule Take 1 capsule by mouth daily at 2 PM daily at 2 PM.   Yes [provider]  TOLAK 4 % CREA USE 1(ONE) APPLICATION(S) TOPICAL EVERY DAY 04/22/16  Yes [provider]    Allergies  Allergen Reactions  . Amlodipine     Other reaction(s): Edema  . Codeine   . Penicillin G Rash  . Rocephin  [Ceftriaxone] Rash    Past Surgical History:  Procedure Laterality Date  . ANKLE ARTHROSCOPY WITH OPEN REDUCTION INTERNAL FIXATION (ORIF) Left 1998  . HERNIA REPAIR Left 2409   umbilical and left inguinal  . KNEE ARTHROSCOPY Right 2006  . THUMB ARTHROSCOPY Right 2013   tendon flap to joint    Social History  Substance Use Topics  . Smoking status: Never Smoker  . Smokeless tobacco: Never Used  . Alcohol use No     Medication list has been reviewed and updated.   Physical Exam  Constitutional: He is oriented to person, place, and time. He appears well-developed. No distress.  HENT:  Head: Normocephalic and atraumatic.  Eyes: Pupils are equal, round,  and reactive to light.  Neck: Normal range of motion. Neck supple.  Cardiovascular: Normal rate, regular rhythm and normal heart sounds.   Pulmonary/Chest: Effort normal and breath sounds normal. No respiratory distress. He has no wheezes. He has no rales.  Musculoskeletal: Normal range of motion.  Neurological: He is alert and oriented to person, place, and time.  Skin: Skin is warm and dry. No rash noted.  Psychiatric: He has a normal mood and affect. His behavior is normal. Thought content normal.  Nursing note and vitals reviewed.   BP 124/80   Pulse 61   Ht 6\' 2"  (1.88 m)   Wt 205 lb 9.6 oz (93.3 kg)   SpO2 96%   BMI 26.40 kg/m   Assessment and Plan: 1. Essential (primary) hypertension Improved sx with lower dose Hyzaar  2. CKD stage 3 secondary to diabetes ([place]) Stable with unchanged GFR  3. Type 2 diabetes mellitus with diabetic nephropathy, without long-term current use of insulin (HCC) Controlled on oral agents and diet Recommend more regular exercise  No orders of the defined types were placed in this encounter.   Halina Maidens, MD [facility]  08/26/2016

## 2016-09-01 DIAGNOSIS — Z0100 Encounter for examination of eyes and vision without abnormal findings: Secondary | ICD-10-CM | POA: Diagnosis not present

## 2016-09-01 LAB — HM DIABETES EYE EXAM

## 2016-09-02 DIAGNOSIS — H25013 Cortical age-related cataract, bilateral: Secondary | ICD-10-CM | POA: Diagnosis not present

## 2016-09-02 DIAGNOSIS — E1036 Type 1 diabetes mellitus with diabetic cataract: Secondary | ICD-10-CM | POA: Diagnosis not present

## 2016-10-13 DIAGNOSIS — R69 Illness, unspecified: Secondary | ICD-10-CM | POA: Diagnosis not present

## 2016-10-22 DIAGNOSIS — M1711 Unilateral primary osteoarthritis, right knee: Secondary | ICD-10-CM | POA: Diagnosis not present

## 2016-10-22 DIAGNOSIS — E1121 Type 2 diabetes mellitus with diabetic nephropathy: Secondary | ICD-10-CM | POA: Diagnosis not present

## 2016-10-22 DIAGNOSIS — M1712 Unilateral primary osteoarthritis, left knee: Secondary | ICD-10-CM | POA: Diagnosis not present

## 2016-10-27 DIAGNOSIS — Z85828 Personal history of other malignant neoplasm of skin: Secondary | ICD-10-CM | POA: Diagnosis not present

## 2016-10-27 DIAGNOSIS — Z859 Personal history of malignant neoplasm, unspecified: Secondary | ICD-10-CM | POA: Diagnosis not present

## 2016-10-27 DIAGNOSIS — Z872 Personal history of diseases of the skin and subcutaneous tissue: Secondary | ICD-10-CM | POA: Diagnosis not present

## 2016-10-27 DIAGNOSIS — L570 Actinic keratosis: Secondary | ICD-10-CM | POA: Diagnosis not present

## 2016-10-27 DIAGNOSIS — L578 Other skin changes due to chronic exposure to nonionizing radiation: Secondary | ICD-10-CM | POA: Diagnosis not present

## 2016-11-12 ENCOUNTER — Other Ambulatory Visit: Payer: Self-pay | Admitting: Internal Medicine

## 2016-11-12 DIAGNOSIS — L570 Actinic keratosis: Secondary | ICD-10-CM | POA: Diagnosis not present

## 2016-11-12 DIAGNOSIS — E1121 Type 2 diabetes mellitus with diabetic nephropathy: Secondary | ICD-10-CM

## 2016-11-13 DIAGNOSIS — M1711 Unilateral primary osteoarthritis, right knee: Secondary | ICD-10-CM | POA: Diagnosis not present

## 2016-12-05 ENCOUNTER — Other Ambulatory Visit: Payer: Self-pay | Admitting: Internal Medicine

## 2016-12-05 DIAGNOSIS — E1121 Type 2 diabetes mellitus with diabetic nephropathy: Secondary | ICD-10-CM

## 2016-12-05 MED ORDER — METFORMIN HCL ER 500 MG PO TB24: 500.0000 mg | ORAL_TABLET | Freq: Every day | ORAL | 1 refills | Status: DC

## 2016-12-11 ENCOUNTER — Other Ambulatory Visit: Payer: Self-pay | Admitting: Internal Medicine

## 2016-12-11 ENCOUNTER — Encounter: Payer: Self-pay | Admitting: Internal Medicine

## 2016-12-11 ENCOUNTER — Ambulatory Visit (INDEPENDENT_AMBULATORY_CARE_PROVIDER_SITE_OTHER): Payer: Medicare HMO | Admitting: Internal Medicine

## 2016-12-11 VITALS — BP 118/70 | HR 80 | Temp 98.1°F | Ht 74.0 in | Wt 207.0 lb

## 2016-12-11 DIAGNOSIS — K2100 Gastro-esophageal reflux disease with esophagitis, without bleeding: Secondary | ICD-10-CM

## 2016-12-11 DIAGNOSIS — R059 Cough, unspecified: Secondary | ICD-10-CM

## 2016-12-11 DIAGNOSIS — E1122 Type 2 diabetes mellitus with diabetic chronic kidney disease: Secondary | ICD-10-CM | POA: Diagnosis not present

## 2016-12-11 DIAGNOSIS — Z0000 Encounter for general adult medical examination without abnormal findings: Secondary | ICD-10-CM | POA: Diagnosis not present

## 2016-12-11 DIAGNOSIS — E1169 Type 2 diabetes mellitus with other specified complication: Secondary | ICD-10-CM

## 2016-12-11 DIAGNOSIS — R05 Cough: Secondary | ICD-10-CM | POA: Diagnosis not present

## 2016-12-11 DIAGNOSIS — N183 Chronic kidney disease, stage 3 (moderate): Secondary | ICD-10-CM

## 2016-12-11 DIAGNOSIS — I10 Essential (primary) hypertension: Secondary | ICD-10-CM

## 2016-12-11 DIAGNOSIS — E1121 Type 2 diabetes mellitus with diabetic nephropathy: Secondary | ICD-10-CM | POA: Diagnosis not present

## 2016-12-11 DIAGNOSIS — E785 Hyperlipidemia, unspecified: Secondary | ICD-10-CM

## 2016-12-11 DIAGNOSIS — M170 Bilateral primary osteoarthritis of knee: Secondary | ICD-10-CM

## 2016-12-11 LAB — POCT URINALYSIS DIPSTICK
BILIRUBIN UA: NEGATIVE
Glucose, UA: NEGATIVE
KETONES UA: NEGATIVE
LEUKOCYTES UA: NEGATIVE
Nitrite, UA: NEGATIVE
PH UA: 5 (ref 5.0–8.0)
Protein, UA: NEGATIVE
SPEC GRAV UA: 1.02 (ref 1.010–1.025)
Urobilinogen, UA: 0.2 E.U./dL

## 2016-12-11 MED ORDER — METFORMIN HCL ER 500 MG PO TB24: 500.0000 mg | ORAL_TABLET | Freq: Every day | ORAL | 1 refills | Status: DC

## 2016-12-11 MED ORDER — ATENOLOL 50 MG PO TABS: 50.0000 mg | ORAL_TABLET | Freq: Every day | ORAL | 3 refills | Status: DC

## 2016-12-11 MED ORDER — LOSARTAN POTASSIUM-HCTZ 50-12.5 MG PO TABS: 1.0000 | ORAL_TABLET | Freq: Every day | ORAL | 3 refills | Status: DC

## 2016-12-11 MED ORDER — ROSUVASTATIN CALCIUM 20 MG PO TABS: 20.0000 mg | ORAL_TABLET | Freq: Every day | ORAL | 3 refills | Status: DC

## 2016-12-11 MED ORDER — PANTOPRAZOLE SODIUM 40 MG PO TBEC: 40.0000 mg | DELAYED_RELEASE_TABLET | Freq: Every day | ORAL | 3 refills | Status: DC

## 2016-12-11 MED ORDER — FLUTICASONE PROPIONATE 50 MCG/ACT NA SUSP: 2.0000 | Freq: Every day | NASAL | 3 refills | Status: DC

## 2016-12-11 MED ORDER — AZITHROMYCIN 250 MG PO TABS: ORAL_TABLET | ORAL | 0 refills | Status: DC

## 2016-12-11 NOTE — Patient Instructions (Signed)
Mucinex DM or other guaifenesin containing cold medication will help with chest symptoms.

## 2016-12-11 NOTE — Progress Notes (Signed)
Date:  12/11/2016   Name:  Maxwell Stanley   DOB:  May 04, 1934   MRN:  [id number]   Chief Complaint: Hypertension; Diabetes; and Chest Congestion (Started yesterday. Coughing- with small mucous coming up. Chest congestion. ) Maxwell Stanley is a 82 y.o. male who presents today for his Complete Annual Exam. He feels fairly well except for recent URI with cough. He reports exercising walking. He reports he is sleeping fairly well.   Hypertension  This is a chronic problem. The problem is controlled. Pertinent negatives include no chest pain, headaches, palpitations or shortness of breath. Hypertensive end-organ damage includes kidney disease.  Diabetes  He presents for his follow-up diabetic visit. He has type 2 diabetes mellitus. His disease course has been stable. Pertinent negatives for hypoglycemia include no dizziness or headaches. Pertinent negatives for diabetes include no chest pain and no fatigue. Current diabetic treatment includes oral agent (dual therapy) (metformin and januvia). He is compliant with treatment all of the time. An ACE inhibitor/angiotensin II receptor blocker is being taken.  Hyperlipidemia  This is a chronic problem. Pertinent negatives include no chest pain, myalgias or shortness of breath. Current antihyperlipidemic treatment includes statins. The current treatment provides significant improvement of lipids.  Gastroesophageal Reflux  He complains of coughing and heartburn. He reports no abdominal pain, no chest pain, no choking, no hoarse voice, no sore throat or no wheezing. This is a chronic problem. The problem occurs occasionally. Pertinent negatives include no fatigue. He has tried a PPI for the symptoms.  Cough  This is a new problem. The current episode started in the past 7 days. The problem has been gradually worsening. The problem occurs every few minutes. The cough is non-productive (occasionally produces small amount of creamy phlegm). Associated  symptoms include heartburn. Pertinent negatives include no chest pain, chills, headaches, myalgias, rash, sore throat, shortness of breath or wheezing. His past medical history is significant for environmental allergies.  Knee Pain   There was no injury mechanism. The pain is present in the right knee. The pain is moderate. The pain has been fluctuating since onset. He has tried NSAIDs (also steroid injection and then gel last week) for the symptoms. The treatment provided moderate relief.   Lab Results  Component Value Date   HGBA1C 7.4 (H) 08/08/2016   Lab Results  Component Value Date   CHOL 145 08/08/2016   HDL 32 (L) 08/08/2016   LDLCALC 62 08/08/2016   TRIG 253 (H) 08/08/2016   CHOLHDL 4.5 08/08/2016   Wt Readings from Last 3 Encounters:  12/11/16 207 lb (93.9 kg)  08/26/16 205 lb 9.6 oz (93.3 kg)  08/25/16 206 lb 3.2 oz (93.5 kg)      Review of Systems  Constitutional: Negative for appetite change, chills, diaphoresis, fatigue and unexpected weight change.  HENT: Negative for hearing loss, hoarse voice, sore throat, tinnitus, trouble swallowing and voice change.   Eyes: Negative for visual disturbance.  Respiratory: Positive for cough. Negative for choking, shortness of breath and wheezing.   Cardiovascular: Negative for chest pain, palpitations and leg swelling.  Gastrointestinal: Positive for heartburn. Negative for abdominal pain, blood in stool, constipation and diarrhea.  Genitourinary: Negative for difficulty urinating, dysuria, frequency, hematuria and urgency.  Musculoskeletal: Positive for arthralgias (knee pain). Negative for back pain and myalgias.  Skin: Negative for color change and rash.  Allergic/Immunologic: Positive for environmental allergies.  Neurological: Negative for dizziness, syncope and headaches.  Hematological: Negative for adenopathy.  Psychiatric/Behavioral: Negative  for dysphoric mood and sleep disturbance.    Patient Active Problem List    Diagnosis Date Noted  . Allergic rhinitis 08/08/2016  . Carpal tunnel syndrome on left 12/12/2015  . Chronic maxillary sinusitis 04/09/2015  . Type 2 diabetes mellitus with diabetic nephropathy, without long-term current use of insulin ([place]) 11/28/2014  . LBP (low back pain) 09/21/2014  . Lumbar canal stenosis 09/21/2014  . Abnormal prostate specific antigen 04/17/2014  . Enlarged prostate with lower urinary tract symptoms (LUTS) 04/17/2014  . CKD stage 3 secondary to diabetes ([place]) 04/17/2014  . Hyperlipidemia associated with type 2 diabetes mellitus ([place]) 04/17/2014  . 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 Essential (primary) hypertension 04/17/2014  . Esophagitis, reflux 04/17/2014  . Current use of proton pump inhibitor 04/17/2014  . Back muscle spasm 04/17/2014  . Degenerative arthritis of finger 04/17/2014  . Foot pain, left 04/17/2014  . Breast development in males 04/17/2014  . 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 Primary osteoarthritis of left knee 11/03/2013  . EDEMA 08/24/2009    Prior to Admission medications   Medication Sig Start Date End Date Taking? Authorizing Provider  acetaminophen (TYLENOL) 500 MG tablet Take 500 mg by mouth every 6 (six) hours as needed.    [provider]  aspirin 81 MG tablet Take 81 mg by mouth daily.      [provider]  atenolol (TENORMIN) 50 MG tablet Take 1 tablet (50 mg total) by mouth at bedtime. 12/12/15   Glean Hess, MD  cholecalciferol (VITAMIN D) 1000 UNITS tablet Take 1 tablet by mouth daily.    [provider]  docusate sodium (COLACE) 100 MG capsule Take by mouth.    [provider]  glucose blood test strip 1 each 2 (two) times daily. 06/24/13   [provider]  loratadine (CLARITIN) 10 MG tablet Take 1 tablet (10 mg total) by mouth daily. 08/08/16   Plonk, Gwyndolyn Saxon, MD  losartan-hydrochlorothiazide (HYZAAR) 100-25 MG tablet Take 1 tablet by mouth daily. Take 1/2 tablet  daily    [provider]  meloxicam (MOBIC) 15 MG tablet TK 1 T PO QD 11/13/16   [provider]  metFORMIN (GLUCOPHAGE-XR) 500 MG 24 hr tablet Take 1 tablet (500 mg total) by mouth daily with breakfast. 12/05/16   Glean Hess, MD  Misc Natural Products (OSTEO BI-FLEX TRIPLE STRENGTH) TABS Take 2 tablets by mouth daily.    [provider]  Multiple Vitamins-Minerals (CENTRUM SILVER) tablet Take 1 tablet by mouth daily.      [provider]  mupirocin ointment (BACTROBAN) 2 % APPLY TO AFFECTED AREA 3 TIMES A DAY 04/21/16   [provider]  Omega-3 Fatty Acids (FISH OIL) 1000 MG CAPS Take 1 capsule by mouth daily at 2 PM. Reported on 12/29/2014    [provider]  pantoprazole (PROTONIX) 40 MG tablet Take 1 tablet (40 mg total) by mouth daily. 12/12/15   Glean Hess, MD  Probiotic Product (PROBIOTIC & ACIDOPHILUS EX ST) CAPS Take 1 capsule by mouth daily at 2 PM daily at 2 PM.    [provider]  rosuvastatin (CRESTOR) 40 MG tablet Take 1 tablet (40 mg total) by mouth at bedtime. 01/17/16   Glean Hess, MD  sitaGLIPtin (JANUVIA) 100 MG tablet Take 1 tablet (100 mg total) by mouth daily. 01/21/16   Glean Hess, MD  tadalafil (CIALIS) 20 MG tablet Take 20 mg by mouth daily.  [provider]  tamsulosin (FLOMAX) 0.4 MG CAPS capsule Take 1 capsule by mouth daily at 2 PM daily at 2 PM.    [provider]  TOLAK 4 % CREA USE 1(ONE) APPLICATION(S) TOPICAL EVERY DAY 04/22/16   [provider]    Allergies  Allergen Reactions  . Amlodipine     Other reaction(s): Edema  . Codeine   . Penicillin G Rash  . Rocephin  [Ceftriaxone] Rash    Past Surgical History:  Procedure Laterality Date  . ANKLE ARTHROSCOPY WITH OPEN REDUCTION INTERNAL FIXATION (ORIF) Left 1998  . HERNIA REPAIR Left 1610   umbilical and left inguinal  . KNEE ARTHROSCOPY Right 2006  . THUMB ARTHROSCOPY Right 2013   tendon  flap to joint    Social History   Tobacco Use  . Smoking status: Never Smoker  . Smokeless tobacco: Never Used  Substance Use Topics  . Alcohol use: No    Alcohol/week: 0.0 oz  . Drug use: No     Medication list has been reviewed and updated.  PHQ 2/9 Scores 08/25/2016 08/13/2015 08/13/2015 11/28/2014  PHQ - 2 Score 0 0 0 0    Physical Exam  Constitutional: He is oriented to person, place, and time. He appears well-developed and well-nourished.  HENT:  Head: Normocephalic.  Right Ear: Tympanic membrane, external ear and ear canal normal.  Left Ear: Tympanic membrane, external ear and ear canal normal.  Nose: Nose normal.  Mouth/Throat: Uvula is midline and oropharynx is clear and moist.  Eyes: Conjunctivae and EOM are normal. Pupils are equal, round, and reactive to light.  Neck: Normal range of motion. Neck supple. Carotid bruit is not present. No thyromegaly present.  Cardiovascular: Normal rate, regular rhythm, normal heart sounds and intact distal pulses.  Pulmonary/Chest: Effort normal and breath sounds normal. He has no wheezes. Right breast exhibits no mass. Left breast exhibits no mass.  Abdominal: Soft. Normal appearance and bowel sounds are normal. There is no hepatosplenomegaly. There is no tenderness.  Musculoskeletal: Normal range of motion. He exhibits no edema.       Right knee: Tenderness found. Medial joint line tenderness noted.  Lymphadenopathy:    He has no cervical adenopathy.  Neurological: He is alert and oriented to person, place, and time. He has normal reflexes.  Skin: Skin is warm, dry and intact.  Psychiatric: He has a normal mood and affect. His speech is normal and behavior is normal. Judgment and thought content normal.  Nursing note and vitals reviewed.   BP 118/70   Pulse 80   Temp 98.1 F (36.7 C) (Oral)   Ht 6\' 2"  (1.88 m)   Wt 207 lb (93.9 kg)   SpO2 96%   BMI 26.58 kg/m   Assessment and Plan: 1. Annual physical exam Continue  exercise, healthy diet  2. Essential (primary) hypertension controlled - Comprehensive metabolic panel - POCT urinalysis dipstick - atenolol (TENORMIN) 50 MG tablet; Take 1 tablet (50 mg total) by mouth at bedtime.  Dispense: 90 tablet; Refill: 3 - losartan-hydrochlorothiazide (HYZAAR) 50-12.5 MG tablet; Take 1 tablet by mouth daily.  Dispense: 90 tablet; Refill: 3  3. Esophagitis, reflux asx on PPI - CBC with Differential/Platelet - pantoprazole (PROTONIX) 40 MG tablet; Take 1 tablet (40 mg total) by mouth daily.  Dispense: 90 tablet; Refill: 3  4. Type 2 diabetes mellitus with diabetic nephropathy, without long-term current use of insulin (HCC) Does not check BS Appears to be controlled - Comprehensive metabolic panel -  Hemoglobin A1c - Microalbumin / creatinine urine ratio - metFORMIN (GLUCOPHAGE-XR) 500 MG 24 hr tablet; Take 1 tablet (500 mg total) by mouth daily with breakfast.  Dispense: 90 tablet; Refill: 1  5. CKD stage 3 secondary to diabetes ([place]) Continue to monitor Need to avoid NSAIDs if worsening  6. Hyperlipidemia associated with type 2 diabetes mellitus (HCC) On statin therapy - Lipid panel - rosuvastatin (CRESTOR) 20 MG tablet; Take 1 tablet (20 mg total) by mouth at bedtime.  Dispense: 90 tablet; Refill: 3  7. Cough - azithromycin (ZITHROMAX Z-PAK) 250 MG tablet; UAD  Dispense: 6 each; Refill: 0  8. Primary osteoarthritis of both knees Follow up with Ortho Continue tylenol and mobic (monitoring renal function)   Meds ordered this encounter  Medications  . rosuvastatin (CRESTOR) 20 MG tablet    Sig: Take 1 tablet (20 mg total) by mouth at bedtime.    Dispense:  90 tablet    Refill:  3  . pantoprazole (PROTONIX) 40 MG tablet    Sig: Take 1 tablet (40 mg total) by mouth daily.    Dispense:  90 tablet    Refill:  3  . metFORMIN (GLUCOPHAGE-XR) 500 MG 24 hr tablet    Sig: Take 1 tablet (500 mg total) by mouth daily with breakfast.    Dispense:  90  tablet    Refill:  1  . atenolol (TENORMIN) 50 MG tablet    Sig: Take 1 tablet (50 mg total) by mouth at bedtime.    Dispense:  90 tablet    Refill:  3  . fluticasone (FLONASE) 50 MCG/ACT nasal spray    Sig: Place 2 sprays into both nostrils daily.    Dispense:  48 g    Refill:  3  . losartan-hydrochlorothiazide (HYZAAR) 50-12.5 MG tablet    Sig: Take 1 tablet by mouth daily.    Dispense:  90 tablet    Refill:  3  . azithromycin (ZITHROMAX Z-PAK) 250 MG tablet    Sig: UAD    Dispense:  6 each    Refill:  0    Partially dictated using Editor, commissioning. Any errors are unintentional.  Halina Maidens, MD [facility]  12/11/2016

## 2016-12-12 ENCOUNTER — Encounter: Payer: Self-pay | Admitting: Internal Medicine

## 2016-12-12 LAB — LIPID PANEL
CHOL/HDL RATIO: 4.9 ratio (ref 0.0–5.0)
CHOLESTEROL TOTAL: 136 mg/dL (ref 100–199)
HDL: 28 mg/dL — AB (ref >39)
LDL CALC: 62 mg/dL (ref 0–99)
TRIGLYCERIDES: 229 mg/dL — AB (ref 0–149)
VLDL CHOLESTEROL CAL: 46 mg/dL — AB (ref 5–40)

## 2016-12-12 LAB — COMPREHENSIVE METABOLIC PANEL
ALBUMIN: 4.3 g/dL (ref 3.5–4.7)
ALT: 17 IU/L (ref 0–44)
AST: 20 IU/L (ref 0–40)
Albumin/Globulin Ratio: 1.9 (ref 1.2–2.2)
Alkaline Phosphatase: 84 IU/L (ref 39–117)
BUN / CREAT RATIO: 22 (ref 10–24)
BUN: 27 mg/dL (ref 8–27)
Bilirubin Total: 0.6 mg/dL (ref 0.0–1.2)
CALCIUM: 9.5 mg/dL (ref 8.6–10.2)
CO2: 24 mmol/L (ref 20–29)
CREATININE: 1.24 mg/dL (ref 0.76–1.27)
Chloride: 102 mmol/L (ref 96–106)
GFR, EST AFRICAN AMERICAN: 63 mL/min/{1.73_m2} (ref >59)
GFR, EST NON AFRICAN AMERICAN: 55 mL/min/{1.73_m2} — AB (ref >59)
GLUCOSE: 160 mg/dL — AB (ref 65–99)
Globulin, Total: 2.3 g/dL (ref 1.5–4.5)
Potassium: 4.5 mmol/L (ref 3.5–5.2)
Sodium: 141 mmol/L (ref 134–144)
TOTAL PROTEIN: 6.6 g/dL (ref 6.0–8.5)

## 2016-12-12 LAB — HEMOGLOBIN A1C
Est. average glucose Bld gHb Est-mCnc: 160 mg/dL
Hgb A1c MFr Bld: 7.2 % — ABNORMAL HIGH (ref 4.8–5.6)

## 2016-12-12 LAB — CBC WITH DIFFERENTIAL/PLATELET
BASOS: 0 %
Basophils Absolute: 0 10*3/uL (ref 0.0–0.2)
EOS (ABSOLUTE): 0.4 10*3/uL (ref 0.0–0.4)
Eos: 6 %
HEMATOCRIT: 42.9 % (ref 37.5–51.0)
HEMOGLOBIN: 14.7 g/dL (ref 13.0–17.7)
IMMATURE GRANS (ABS): 0 10*3/uL (ref 0.0–0.1)
Immature Granulocytes: 0 %
LYMPHS ABS: 1.8 10*3/uL (ref 0.7–3.1)
Lymphs: 26 %
MCH: 32.5 pg (ref 26.6–33.0)
MCHC: 34.3 g/dL (ref 31.5–35.7)
MCV: 95 fL (ref 79–97)
Monocytes Absolute: 0.8 10*3/uL (ref 0.1–0.9)
Monocytes: 12 %
NEUTROS ABS: 3.7 10*3/uL (ref 1.4–7.0)
NEUTROS PCT: 56 %
Platelets: 174 10*3/uL (ref 150–379)
RBC: 4.53 x10E6/uL (ref 4.14–5.80)
RDW: 13.8 % (ref 12.3–15.4)
WBC: 6.7 10*3/uL (ref 3.4–10.8)

## 2016-12-17 ENCOUNTER — Other Ambulatory Visit: Payer: Self-pay | Admitting: Internal Medicine

## 2016-12-17 DIAGNOSIS — Z23 Encounter for immunization: Secondary | ICD-10-CM

## 2016-12-17 LAB — MICROALBUMIN / CREATININE URINE RATIO
CREATININE, UR: 110 mg/dL
MICROALBUM., U, RANDOM: 12.1 ug/mL
Microalb/Creat Ratio: 11 mg/g creat (ref 0.0–30.0)

## 2016-12-17 MED ORDER — ZOSTER VAC RECOMB ADJUVANTED 50 MCG/0.5ML IM SUSR: 0.5000 mL | Freq: Once | INTRAMUSCULAR | 1 refills | Status: AC

## 2017-01-07 ENCOUNTER — Other Ambulatory Visit: Payer: Self-pay

## 2017-01-07 DIAGNOSIS — E1121 Type 2 diabetes mellitus with diabetic nephropathy: Secondary | ICD-10-CM

## 2017-01-07 MED ORDER — SITAGLIPTIN PHOSPHATE 100 MG PO TABS: 100.0000 mg | ORAL_TABLET | Freq: Every day | ORAL | 3 refills | Status: DC

## 2017-01-07 NOTE — Progress Notes (Signed)
Patient called requesting refill on Januvia 100mg  to mail order. Sent in medication to pharmacy.

## 2017-02-09 DIAGNOSIS — R69 Illness, unspecified: Secondary | ICD-10-CM | POA: Diagnosis not present

## 2017-02-25 DIAGNOSIS — R69 Illness, unspecified: Secondary | ICD-10-CM | POA: Diagnosis not present

## 2017-03-02 DIAGNOSIS — J019 Acute sinusitis, unspecified: Secondary | ICD-10-CM | POA: Diagnosis not present

## 2017-03-02 DIAGNOSIS — H109 Unspecified conjunctivitis: Secondary | ICD-10-CM | POA: Diagnosis not present

## 2017-03-17 DIAGNOSIS — R69 Illness, unspecified: Secondary | ICD-10-CM | POA: Diagnosis not present

## 2017-04-10 DIAGNOSIS — N401 Enlarged prostate with lower urinary tract symptoms: Secondary | ICD-10-CM | POA: Diagnosis not present

## 2017-04-10 DIAGNOSIS — R3914 Feeling of incomplete bladder emptying: Secondary | ICD-10-CM | POA: Diagnosis not present

## 2017-04-10 DIAGNOSIS — N529 Male erectile dysfunction, unspecified: Secondary | ICD-10-CM | POA: Diagnosis not present

## 2017-04-10 DIAGNOSIS — E1122 Type 2 diabetes mellitus with diabetic chronic kidney disease: Secondary | ICD-10-CM | POA: Diagnosis not present

## 2017-04-10 DIAGNOSIS — E785 Hyperlipidemia, unspecified: Secondary | ICD-10-CM | POA: Diagnosis not present

## 2017-04-10 DIAGNOSIS — R350 Frequency of micturition: Secondary | ICD-10-CM | POA: Diagnosis not present

## 2017-04-10 DIAGNOSIS — R339 Retention of urine, unspecified: Secondary | ICD-10-CM | POA: Diagnosis not present

## 2017-04-10 DIAGNOSIS — R972 Elevated prostate specific antigen [PSA]: Secondary | ICD-10-CM | POA: Diagnosis not present

## 2017-04-10 DIAGNOSIS — I129 Hypertensive chronic kidney disease with stage 1 through stage 4 chronic kidney disease, or unspecified chronic kidney disease: Secondary | ICD-10-CM | POA: Diagnosis not present

## 2017-04-10 DIAGNOSIS — N183 Chronic kidney disease, stage 3 (moderate): Secondary | ICD-10-CM | POA: Diagnosis not present

## 2017-04-10 DIAGNOSIS — N62 Hypertrophy of breast: Secondary | ICD-10-CM | POA: Diagnosis not present

## 2017-04-10 DIAGNOSIS — Z880 Allergy status to penicillin: Secondary | ICD-10-CM | POA: Diagnosis not present

## 2017-04-10 LAB — PSA: PSA: 1.83

## 2017-04-13 ENCOUNTER — Telehealth: Payer: Self-pay

## 2017-04-13 NOTE — Telephone Encounter (Signed)
08/27/17 AWV rescheduled to 08/26/17

## 2017-04-15 ENCOUNTER — Encounter: Payer: Self-pay | Admitting: Internal Medicine

## 2017-04-15 ENCOUNTER — Ambulatory Visit (INDEPENDENT_AMBULATORY_CARE_PROVIDER_SITE_OTHER): Payer: Medicare HMO | Admitting: Internal Medicine

## 2017-04-15 VITALS — BP 138/82 | HR 65 | Ht 74.0 in | Wt 204.0 lb

## 2017-04-15 DIAGNOSIS — I10 Essential (primary) hypertension: Secondary | ICD-10-CM

## 2017-04-15 DIAGNOSIS — E785 Hyperlipidemia, unspecified: Secondary | ICD-10-CM

## 2017-04-15 DIAGNOSIS — E1121 Type 2 diabetes mellitus with diabetic nephropathy: Secondary | ICD-10-CM

## 2017-04-15 DIAGNOSIS — E1169 Type 2 diabetes mellitus with other specified complication: Secondary | ICD-10-CM

## 2017-04-15 NOTE — Progress Notes (Signed)
Date:  04/15/2017   Name:  Maxwell Stanley   DOB:  05-14-1934   MRN:  [id number]   Chief Complaint: Diabetes and Hypertension Hypertension  This is a chronic problem. The problem is unchanged. The problem is controlled. Pertinent negatives include no chest pain, headaches, orthopnea, palpitations, peripheral edema or shortness of breath. Past treatments include beta blockers, ACE inhibitors and diuretics. The current treatment provides significant improvement.  Diabetes  He presents for his follow-up diabetic visit. He has type 2 diabetes mellitus. Pertinent negatives for hypoglycemia include no headaches or tremors. Pertinent negatives for diabetes include no chest pain, no fatigue, no polydipsia and no polyuria. Symptoms are stable. Current diabetic treatment includes oral agent (monotherapy). His weight is stable. He monitors blood glucose at home 1-2 x per day. His breakfast blood glucose is taken between 8-9 am. His breakfast blood glucose range is generally 110-130 mg/dl.   Lab Results  Component Value Date   HGBA1C 7.2 (H) 12/11/2016   Lab Results  Component Value Date   CREATININE 1.24 12/11/2016   BUN 27 12/11/2016   NA 141 12/11/2016   K 4.5 12/11/2016   CL 102 12/11/2016   CO2 24 12/11/2016   Lab Results  Component Value Date   CHOL 136 12/11/2016   HDL 28 (L) 12/11/2016   LDLCALC 62 12/11/2016   TRIG 229 (H) 12/11/2016   CHOLHDL 4.9 12/11/2016      Review of Systems  Constitutional: Negative for appetite change, fatigue and unexpected weight change.  Eyes: Negative for visual disturbance.  Respiratory: Negative for cough, shortness of breath and wheezing.   Cardiovascular: Negative for chest pain, palpitations, orthopnea and leg swelling.  Gastrointestinal: Negative for abdominal pain and blood in stool.  Endocrine: Negative for polydipsia and polyuria.  Genitourinary: Negative for dysuria and hematuria.  Musculoskeletal: Positive for arthralgias (knee  OA).  Skin: Negative for color change and rash.  Neurological: Negative for tremors, numbness and headaches.  Psychiatric/Behavioral: Negative for dysphoric mood.    Patient Active Problem List   Diagnosis Date Noted  . Allergic rhinitis 08/08/2016  . Carpal tunnel syndrome on left 12/12/2015  . Chronic maxillary sinusitis 04/09/2015  . Type 2 diabetes mellitus with diabetic nephropathy, without long-term current use of insulin ([place]) 11/28/2014  . LBP (low back pain) 09/21/2014  . Lumbar canal stenosis 09/21/2014  . Abnormal prostate specific antigen 04/17/2014  . Enlarged prostate with lower urinary tract symptoms (LUTS) 04/17/2014  . CKD stage 3 secondary to diabetes ([place]) 04/17/2014  . Hyperlipidemia associated with type 2 diabetes mellitus ([place]) 04/17/2014  . 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 Essential (primary) hypertension 04/17/2014  . Esophagitis, reflux 04/17/2014  . Current use of proton pump inhibitor 04/17/2014  . Back muscle spasm 04/17/2014  . Degenerative arthritis of finger 04/17/2014  . Foot pain, left 04/17/2014  . Breast development in males 04/17/2014  . 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 Osteoarthritis of both knees 11/03/2013  . EDEMA 08/24/2009    Prior to Admission medications   Medication Sig Start Date End Date Taking? Authorizing Provider  acetaminophen (TYLENOL) 500 MG tablet Take 500 mg by mouth every 6 (six) hours as needed.   Yes [provider]  aspirin 81 MG tablet Take 81 mg by mouth daily.     Yes [provider]  atenolol (TENORMIN) 50 MG tablet Take 1 tablet (50 mg total) by mouth at bedtime. 12/11/16  Yes Glean Hess, MD  cholecalciferol (  VITAMIN D) 1000 UNITS tablet Take 1 tablet by mouth daily.   Yes [provider]  docusate sodium (COLACE) 100 MG capsule Take by mouth.   Yes [provider]  finasteride (PROSCAR) 5 MG tablet Take 5 mg by mouth daily.   Yes [provider]    fluticasone (FLONASE) 50 MCG/ACT nasal spray Place 2 sprays into both nostrils daily. 12/11/16  Yes Glean Hess, MD  glucose blood test strip 1 each 2 (two) times daily. 06/24/13  Yes [provider]  loratadine (CLARITIN) 10 MG tablet Take 1 tablet (10 mg total) by mouth daily. 08/08/16  Yes Plonk, Gwyndolyn Saxon, MD  losartan-hydrochlorothiazide (HYZAAR) 50-12.5 MG tablet Take 1 tablet by mouth daily. 12/11/16  Yes Glean Hess, MD  meloxicam (MOBIC) 15 MG tablet TK 1 T PO QD 11/13/16  Yes [provider]  metFORMIN (GLUCOPHAGE-XR) 500 MG 24 hr tablet Take 1 tablet (500 mg total) by mouth daily with breakfast. 12/11/16  Yes Glean Hess, MD  Misc Natural Products (OSTEO BI-FLEX TRIPLE STRENGTH) TABS Take 2 tablets by mouth daily.   Yes [provider]  Multiple Vitamins-Minerals (CENTRUM SILVER) tablet Take 1 tablet by mouth daily.     Yes [provider]  mupirocin ointment (BACTROBAN) 2 % APPLY TO AFFECTED AREA 3 TIMES A DAY 04/21/16  Yes [provider]  Omega-3 Fatty Acids (FISH OIL) 1000 MG CAPS Take 1 capsule by mouth daily at 2 PM. Reported on 12/29/2014   Yes [provider]  pantoprazole (PROTONIX) 40 MG tablet Take 1 tablet (40 mg total) by mouth daily. 12/11/16  Yes Glean Hess, MD  rosuvastatin (CRESTOR) 20 MG tablet Take 1 tablet (20 mg total) by mouth at bedtime. 12/11/16  Yes Glean Hess, MD  sitaGLIPtin (JANUVIA) 100 MG tablet Take 1 tablet (100 mg total) by mouth daily. 01/07/17  Yes Glean Hess, MD  tadalafil (CIALIS) 20 MG tablet Take 20 mg by mouth daily.     Yes [provider]  tamsulosin (FLOMAX) 0.4 MG CAPS capsule Take 1 capsule by mouth daily at 2 PM daily at 2 PM.   Yes [provider]  TOLAK 4 % CREA USE 1(ONE) APPLICATION(S) TOPICAL EVERY DAY 04/22/16  Yes [provider]    Allergies  Allergen Reactions  . Amlodipine     Other reaction(s): Edema  . Codeine   .  Penicillin G Rash  . Rocephin  [Ceftriaxone] Rash    Past Surgical History:  Procedure Laterality Date  . ANKLE ARTHROSCOPY WITH OPEN REDUCTION INTERNAL FIXATION (ORIF) Left 1998  . HERNIA REPAIR Left 8101   umbilical and left inguinal  . KNEE ARTHROSCOPY Right 2006  . THUMB ARTHROSCOPY Right 2013   tendon flap to joint    Social History   Tobacco Use  . Smoking status: Never Smoker  . Smokeless tobacco: Never Used  Substance Use Topics  . Alcohol use: No    Alcohol/week: 0.0 oz  . Drug use: No     Medication list has been reviewed and updated.  PHQ 2/9 Scores 08/25/2016 08/13/2015 08/13/2015 11/28/2014  PHQ - 2 Score 0 0 0 0    Physical Exam  Constitutional: He is oriented to person, place, and time. He appears well-developed. No distress.  HENT:  Head: Normocephalic and atraumatic.  Right Ear: Ear canal normal.  Left Ear: Ear canal normal.  Nose: Right sinus exhibits no maxillary sinus tenderness. Left sinus exhibits no maxillary sinus  tenderness.  Mouth/Throat: No posterior oropharyngeal edema or posterior oropharyngeal erythema.  Neck: Normal range of motion. Neck supple.  Cardiovascular: Normal rate, regular rhythm and normal heart sounds.  Pulmonary/Chest: Effort normal and breath sounds normal. No respiratory distress. He has no wheezes.  Musculoskeletal: Normal range of motion.  Neurological: He is alert and oriented to person, place, and time. He has normal strength. No sensory deficit.  Skin: Skin is warm and dry. No rash noted.  Psychiatric: He has a normal mood and affect. His behavior is normal. Thought content normal.  Nursing note and vitals reviewed.   BP 138/82   Pulse 65   Ht 6\' 2"  (1.88 m)   Wt 204 lb (92.5 kg)   SpO2 96%   BMI 26.19 kg/m   Assessment and Plan: 1. Type 2 diabetes mellitus with diabetic nephropathy, without long-term current use of insulin (HCC) Continue current oral therapy, exercise and diet - Hemoglobin A1c  2. Essential  (primary) hypertension controlled  3. Hyperlipidemia associated with type 2 diabetes mellitus ([place]) Recent labs good LDL < 70   No orders of the defined types were placed in this encounter.   Partially dictated using Editor, commissioning. Any errors are unintentional.  Halina Maidens, MD [facility]  04/15/2017

## 2017-04-16 LAB — HEMOGLOBIN A1C
Est. average glucose Bld gHb Est-mCnc: 157 mg/dL
HEMOGLOBIN A1C: 7.1 % — AB (ref 4.8–5.6)

## 2017-05-04 DIAGNOSIS — M1711 Unilateral primary osteoarthritis, right knee: Secondary | ICD-10-CM | POA: Diagnosis not present

## 2017-05-04 DIAGNOSIS — E1121 Type 2 diabetes mellitus with diabetic nephropathy: Secondary | ICD-10-CM | POA: Diagnosis not present

## 2017-05-08 DIAGNOSIS — M2392 Unspecified internal derangement of left knee: Secondary | ICD-10-CM | POA: Diagnosis not present

## 2017-05-08 DIAGNOSIS — E1121 Type 2 diabetes mellitus with diabetic nephropathy: Secondary | ICD-10-CM | POA: Diagnosis not present

## 2017-05-08 DIAGNOSIS — M1712 Unilateral primary osteoarthritis, left knee: Secondary | ICD-10-CM | POA: Diagnosis not present

## 2017-05-13 DIAGNOSIS — Z872 Personal history of diseases of the skin and subcutaneous tissue: Secondary | ICD-10-CM | POA: Diagnosis not present

## 2017-05-13 DIAGNOSIS — Z85828 Personal history of other malignant neoplasm of skin: Secondary | ICD-10-CM | POA: Diagnosis not present

## 2017-05-13 DIAGNOSIS — L570 Actinic keratosis: Secondary | ICD-10-CM | POA: Diagnosis not present

## 2017-05-13 DIAGNOSIS — L578 Other skin changes due to chronic exposure to nonionizing radiation: Secondary | ICD-10-CM | POA: Diagnosis not present

## 2017-05-13 DIAGNOSIS — Z859 Personal history of malignant neoplasm, unspecified: Secondary | ICD-10-CM | POA: Diagnosis not present

## 2017-05-14 ENCOUNTER — Other Ambulatory Visit: Payer: Self-pay | Admitting: Internal Medicine

## 2017-05-14 MED ORDER — SCOPOLAMINE 1 MG/3DAYS TD PT72: 1.0000 | MEDICATED_PATCH | TRANSDERMAL | 0 refills | Status: DC

## 2017-06-10 ENCOUNTER — Other Ambulatory Visit: Payer: Self-pay | Admitting: Internal Medicine

## 2017-06-10 DIAGNOSIS — E1121 Type 2 diabetes mellitus with diabetic nephropathy: Secondary | ICD-10-CM

## 2017-08-19 ENCOUNTER — Ambulatory Visit (INDEPENDENT_AMBULATORY_CARE_PROVIDER_SITE_OTHER): Payer: Medicare HMO | Admitting: Internal Medicine

## 2017-08-19 ENCOUNTER — Other Ambulatory Visit: Payer: Self-pay

## 2017-08-19 ENCOUNTER — Encounter: Payer: Self-pay | Admitting: Internal Medicine

## 2017-08-19 VITALS — BP 128/68 | HR 66 | Ht 74.0 in | Wt 204.0 lb

## 2017-08-19 DIAGNOSIS — B351 Tinea unguium: Secondary | ICD-10-CM | POA: Insufficient documentation

## 2017-08-19 DIAGNOSIS — R3912 Poor urinary stream: Secondary | ICD-10-CM | POA: Diagnosis not present

## 2017-08-19 DIAGNOSIS — I10 Essential (primary) hypertension: Secondary | ICD-10-CM

## 2017-08-19 DIAGNOSIS — E1122 Type 2 diabetes mellitus with diabetic chronic kidney disease: Secondary | ICD-10-CM

## 2017-08-19 DIAGNOSIS — E785 Hyperlipidemia, unspecified: Secondary | ICD-10-CM

## 2017-08-19 DIAGNOSIS — E1121 Type 2 diabetes mellitus with diabetic nephropathy: Secondary | ICD-10-CM

## 2017-08-19 DIAGNOSIS — N401 Enlarged prostate with lower urinary tract symptoms: Secondary | ICD-10-CM

## 2017-08-19 DIAGNOSIS — E1169 Type 2 diabetes mellitus with other specified complication: Secondary | ICD-10-CM | POA: Diagnosis not present

## 2017-08-19 DIAGNOSIS — R69 Illness, unspecified: Secondary | ICD-10-CM | POA: Diagnosis not present

## 2017-08-19 DIAGNOSIS — N183 Chronic kidney disease, stage 3 unspecified: Secondary | ICD-10-CM

## 2017-08-19 MED ORDER — ONETOUCH DELICA LANCETS 33G MISC: 1.0000 | Freq: Two times a day (BID) | 12 refills | Status: DC

## 2017-08-19 MED ORDER — TAMSULOSIN HCL 0.4 MG PO CAPS: 0.4000 mg | ORAL_CAPSULE | Freq: Every day | ORAL | 3 refills | Status: DC

## 2017-08-19 MED ORDER — GLUCOSE BLOOD VI STRP: ORAL_STRIP | 12 refills | Status: DC

## 2017-08-19 MED ORDER — FINASTERIDE 5 MG PO TABS: 5.0000 mg | ORAL_TABLET | Freq: Every day | ORAL | 3 refills | Status: DC

## 2017-08-19 NOTE — Progress Notes (Signed)
Date:  08/19/2017   Name:  Maxwell Stanley   DOB:  1934-03-19   MRN:  [id number]   Chief Complaint: Diabetes and Hypertension  Diabetes  He presents for his follow-up diabetic visit. He has type 2 diabetes mellitus. His disease course has been stable. Pertinent negatives for hypoglycemia include no headaches or tremors. Pertinent negatives for diabetes include no chest pain, no fatigue, no polydipsia and no polyuria. Symptoms are stable. Current diabetic treatment includes oral agent (dual therapy) (metformin and januvia). He is compliant with treatment all of the time. He monitors blood glucose at home 1-2 x per week. His breakfast blood glucose is taken between 7-8 am. His breakfast blood glucose range is generally 110-130 mg/dl. An ACE inhibitor/angiotensin II receptor blocker is being taken. Eye exam is current.  Hypertension  This is a chronic problem. The problem is controlled. Pertinent negatives include no chest pain, headaches, palpitations or shortness of breath. Past treatments include beta blockers, ACE inhibitors and diuretics.  Benign Prostatic Hypertrophy  This is a chronic problem. The problem is unchanged. Pertinent negatives include no dysuria or hematuria. Past treatments include tamsulosin and finasteride. The treatment provided significant relief.  Renal insuff - no longer seeing nephrology since values have been stable.   Lab Results  Component Value Date   HGBA1C 7.1 (H) 04/15/2017   Lab Results  Component Value Date   CREATININE 1.24 12/11/2016   BUN 27 12/11/2016   NA 141 12/11/2016   K 4.5 12/11/2016   CL 102 12/11/2016   CO2 24 12/11/2016     Review of Systems  Constitutional: Negative for appetite change, fatigue and unexpected weight change.  Eyes: Negative for visual disturbance.  Respiratory: Negative for cough, shortness of breath and wheezing.   Cardiovascular: Negative for chest pain, palpitations and leg swelling.  Gastrointestinal:  Negative for abdominal pain and blood in stool.  Endocrine: Negative for polydipsia and polyuria.  Genitourinary: Negative for dysuria and hematuria.  Skin: Negative for color change and rash.  Neurological: Negative for tremors, numbness and headaches.  Psychiatric/Behavioral: Negative for dysphoric mood.    Patient Active Problem List   Diagnosis Date Noted  . Allergic rhinitis 08/08/2016  . Carpal tunnel syndrome on left 12/12/2015  . Chronic maxillary sinusitis 04/09/2015  . Type 2 diabetes mellitus with diabetic nephropathy, without long-term current use of insulin ([place]) 11/28/2014  . LBP (low back pain) 09/21/2014  . Lumbar canal stenosis 09/21/2014  . Abnormal prostate specific antigen 04/17/2014  . Enlarged prostate with lower urinary tract symptoms (LUTS) 04/17/2014  . CKD stage 3 secondary to diabetes ([place]) 04/17/2014  . Hyperlipidemia associated with type 2 diabetes mellitus ([place]) 04/17/2014  . 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 Essential (primary) hypertension 04/17/2014  . Esophagitis, reflux 04/17/2014  . Current use of proton pump inhibitor 04/17/2014  . Back muscle spasm 04/17/2014  . Degenerative arthritis of finger 04/17/2014  . Foot pain, left 04/17/2014  . Breast development in males 04/17/2014  . 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 Osteoarthritis of both knees 11/03/2013  . EDEMA 08/24/2009    Allergies  Allergen Reactions  . Amlodipine     Other reaction(s): Edema  . Codeine   . Penicillin G Rash  . Rocephin  [Ceftriaxone] Rash    Past Surgical History:  Procedure Laterality Date  . ANKLE ARTHROSCOPY WITH OPEN REDUCTION INTERNAL FIXATION (ORIF) Left 1998  . HERNIA REPAIR Left 7209   umbilical and left inguinal  .  KNEE ARTHROSCOPY Right 2006  . THUMB ARTHROSCOPY Right 2013   tendon flap to joint    Social History   Tobacco Use  . Smoking status: Never Smoker  . Smokeless tobacco: Never Used  Substance Use Topics  . Alcohol  use: No    Alcohol/week: 0.0 standard drinks  . Drug use: No     Medication list has been reviewed and updated.  Current Meds  Medication Sig  . acetaminophen (TYLENOL) 500 MG tablet Take 500 mg by mouth every 6 (six) hours as needed.  [name] aspirin 81 MG tablet Take 81 mg by mouth daily.    [name] atenolol (TENORMIN) 50 MG tablet Take 1 tablet (50 mg total) by mouth at bedtime.  . cholecalciferol (VITAMIN D) 1000 UNITS tablet Take 1 tablet by mouth daily.  [name] docusate sodium (COLACE) 100 MG capsule Take by mouth.  . finasteride (PROSCAR) 5 MG tablet Take 5 mg by mouth daily.  . fluticasone (FLONASE) 50 MCG/ACT nasal spray Place 2 sprays into both nostrils daily.  [name] glucose blood test strip 1 each 2 (two) times daily.  [name] losartan-hydrochlorothiazide (HYZAAR) 50-12.5 MG tablet Take 1 tablet by mouth daily.  . meloxicam (MOBIC) 15 MG tablet TK 1 T PO QD  . metFORMIN (GLUCOPHAGE-XR) 500 MG 24 hr tablet TAKE 1 TABLET DAILY WITH   BREAKFAST  . Misc Natural Products (OSTEO BI-FLEX TRIPLE STRENGTH) TABS Take 2 tablets by mouth daily.  . Multiple Vitamins-Minerals (CENTRUM SILVER) tablet Take 1 tablet by mouth daily.    . mupirocin ointment (BACTROBAN) 2 % APPLY TO AFFECTED AREA 3 TIMES A DAY  . Omega-3 Fatty Acids (FISH OIL) 1000 MG CAPS Take 1 capsule by mouth daily at 2 PM. Reported on 12/29/2014  . pantoprazole (PROTONIX) 40 MG tablet Take 1 tablet (40 mg total) by mouth daily.  . rosuvastatin (CRESTOR) 20 MG tablet Take 1 tablet (20 mg total) by mouth at bedtime.  . sitaGLIPtin (JANUVIA) 100 MG tablet Take 1 tablet (100 mg total) by mouth daily.  . tadalafil (CIALIS) 20 MG tablet Take 20 mg by mouth daily.    . tamsulosin (FLOMAX) 0.4 MG CAPS capsule Take 1 capsule by mouth daily at 2 PM daily at 2 PM.  . TOLAK 4 % CREA USE 1(ONE) APPLICATION(S) TOPICAL EVERY DAY    PHQ 2/9 Scores 08/25/2016 08/13/2015 08/13/2015 11/28/2014  PHQ - 2 Score 0 0 0 0    Physical Exam  Constitutional: He is oriented  to person, place, and time. He appears well-developed. No distress.  HENT:  Head: Normocephalic and atraumatic.  Neck: Normal range of motion. Neck supple.  Cardiovascular: Normal rate, regular rhythm and normal heart sounds.  Pulmonary/Chest: Effort normal and breath sounds normal. No respiratory distress.  Musculoskeletal: Normal range of motion. He exhibits no edema or tenderness.  Neurological: He is alert and oriented to person, place, and time.  Skin: Skin is warm and dry. No rash noted.  Fungal nail right great toe - non tender  Psychiatric: He has a normal mood and affect. His behavior is normal. Thought content normal.  Nursing note and vitals reviewed.   BP 128/68   Pulse 66   Ht 6\' 2"  (1.88 m)   Wt 204 lb (92.5 kg)   SpO2 97%   BMI 26.19 kg/m   Assessment and Plan: 1. Essential (primary) hypertension controlled  2. Type 2 diabetes mellitus with diabetic nephropathy, without long-term current use of insulin (HCC) Doing well - Hemoglobin A1c  3. Hyperlipidemia associated with type 2 diabetes mellitus ([place]) Continue statin therapy  4. Benign prostatic hyperplasia with weak urinary stream Continue current medications - finasteride (PROSCAR) 5 MG tablet; Take 1 tablet (5 mg total) by mouth daily.  Dispense: 90 tablet; Refill: 3 - tamsulosin (FLOMAX) 0.4 MG CAPS capsule; Take 1 capsule (0.4 mg total) by mouth daily at 2 PM.  Dispense: 90 capsule; Refill: 3  5. CKD stage 3 secondary to diabetes [facility]) - Basic metabolic panel  6. Onychomycosis of toenail Keep nail trimmed and smooth No specific treatment recommended  Meds ordered this encounter  Medications  . finasteride (PROSCAR) 5 MG tablet    Sig: Take 1 tablet (5 mg total) by mouth daily.    Dispense:  90 tablet    Refill:  3  . tamsulosin (FLOMAX) 0.4 MG CAPS capsule    Sig: Take 1 capsule (0.4 mg total) by mouth daily at 2 PM.    Dispense:  90 capsule    Refill:  3    Partially dictated using Radio producer. Any errors are unintentional.  Halina Maidens, MD [facility]  08/19/2017

## 2017-08-20 LAB — BASIC METABOLIC PANEL
BUN / CREAT RATIO: 19 (ref 10–24)
BUN: 23 mg/dL (ref 8–27)
CALCIUM: 9.5 mg/dL (ref 8.6–10.2)
CHLORIDE: 102 mmol/L (ref 96–106)
CO2: 22 mmol/L (ref 20–29)
Creatinine, Ser: 1.24 mg/dL (ref 0.76–1.27)
GFR calc Af Amer: 63 mL/min/{1.73_m2} (ref >59)
GFR calc non Af Amer: 55 mL/min/{1.73_m2} — ABNORMAL LOW (ref >59)
GLUCOSE: 170 mg/dL — AB (ref 65–99)
Potassium: 4.3 mmol/L (ref 3.5–5.2)
Sodium: 141 mmol/L (ref 134–144)

## 2017-08-20 LAB — HEMOGLOBIN A1C
Est. average glucose Bld gHb Est-mCnc: 177 mg/dL
HEMOGLOBIN A1C: 7.8 % — AB (ref 4.8–5.6)

## 2017-08-25 DIAGNOSIS — L821 Other seborrheic keratosis: Secondary | ICD-10-CM | POA: Diagnosis not present

## 2017-08-25 DIAGNOSIS — L570 Actinic keratosis: Secondary | ICD-10-CM | POA: Diagnosis not present

## 2017-08-26 ENCOUNTER — Ambulatory Visit: Payer: Self-pay

## 2017-08-27 ENCOUNTER — Ambulatory Visit: Payer: Medicare HMO

## 2017-09-02 DIAGNOSIS — E1036 Type 1 diabetes mellitus with diabetic cataract: Secondary | ICD-10-CM | POA: Diagnosis not present

## 2017-09-02 DIAGNOSIS — H25013 Cortical age-related cataract, bilateral: Secondary | ICD-10-CM | POA: Diagnosis not present

## 2017-09-02 LAB — HM DIABETES EYE EXAM

## 2017-09-04 ENCOUNTER — Encounter: Payer: Self-pay | Admitting: Internal Medicine

## 2017-09-08 ENCOUNTER — Other Ambulatory Visit: Payer: Self-pay

## 2017-09-08 DIAGNOSIS — E1121 Type 2 diabetes mellitus with diabetic nephropathy: Secondary | ICD-10-CM

## 2017-09-08 MED ORDER — METFORMIN HCL ER 500 MG PO TB24: 500.0000 mg | ORAL_TABLET | Freq: Two times a day (BID) | ORAL | 1 refills | Status: DC

## 2017-09-08 NOTE — Progress Notes (Signed)
Patient called requesting refill for metformin 500 BID and wants sent to [facility].  Sent in medication for patient and called to inform him.

## 2017-09-09 ENCOUNTER — Ambulatory Visit (INDEPENDENT_AMBULATORY_CARE_PROVIDER_SITE_OTHER): Payer: Medicare HMO

## 2017-09-09 VITALS — BP 124/60 | HR 71 | Temp 97.3°F | Resp 12 | Ht 74.0 in | Wt 200.6 lb

## 2017-09-09 DIAGNOSIS — Z23 Encounter for immunization: Secondary | ICD-10-CM

## 2017-09-09 DIAGNOSIS — Z0000 Encounter for general adult medical examination without abnormal findings: Secondary | ICD-10-CM

## 2017-09-09 NOTE — Progress Notes (Signed)
Subjective:   Maxwell Stanley is a 83 y.o. male who presents for Medicare Annual/Subsequent preventive examination.  Review of Systems:  N/A Cardiac Risk Factors include: advanced age (>75men, >47 women);diabetes mellitus;dyslipidemia;hypertension;male gender     Objective:    Vitals: BP 124/60 (BP Location: Right Arm, Patient Position: Sitting, Cuff Size: Normal)   Pulse 71   Temp (!) 97.3 F (36.3 C) (Oral)   Resp 12   Ht 6\' 2"  (1.88 m)   Wt 200 lb 9.6 oz (91 kg)   SpO2 93%   BMI 25.76 kg/m   Body mass index is 25.76 kg/m.  Advanced Directives 09/09/2017 08/25/2016 08/13/2015 08/13/2015 04/09/2015 03/26/2015 07/28/2014  Does Patient Have a Medical Advance Directive? Yes Yes Yes Yes Yes Yes Yes  Type of Paramedic of [place];Living will [place];Living will [place];Living will [place];Living will [place];Living will [place];Living will Living will;Healthcare Power of [place] in Chart? No - copy requested No - copy requested - - - - -    Tobacco Social History   Tobacco Use  Smoking Status Never Smoker  Smokeless Tobacco Never Used  Tobacco Comment   smoking cessation materials not required     Counseling given: No Comment: smoking cessation materials not required  Clinical Intake:  Pre-visit preparation completed: Yes  Pain : No/denies pain   BMI - recorded: 25.76 Nutritional Status: BMI 25 -29 Overweight Nutritional Risks: None  Nutrition Risk Assessment: Has the patient had any N/V/D within the last 2 months?  No Does the patient have any non-healing wounds?  No Has the patient had any unintentional weight loss or weight gain?  No  Is the patient diabetic?  Yes If diabetic, was a CBG obtained today?  No Did the patient bring in their glucometer from home?  No Comments: Pt monitors CBG's daily.  Denies any financial strains with the device or supplies.  Diabetic Exams: Diabetic Eye Exam: Completed 09/02/17.  Diabetic Foot Exam: Completed 12/11/16.   How often do you need to have someone help you when you read instructions, pamphlets, or other written materials from your doctor or pharmacy?: 1 - Never  Interpreter Needed?: No  Information entered by :: Idell Pickles, LPN  Past Medical History:  Diagnosis Date  . BPH (benign prostatic hypertrophy)    with BOO  . Diabetes mellitus without complication ([place])   . Nocturia   . Organic impotence   . Urinary frequency    Past Surgical History:  Procedure Laterality Date  . ANKLE ARTHROSCOPY WITH OPEN REDUCTION INTERNAL FIXATION (ORIF) Left 1998  . HERNIA REPAIR Left 6160   umbilical and left inguinal  . KNEE ARTHROSCOPY Right 2006  . THUMB ARTHROSCOPY Right 2013   tendon flap to joint   Family History  Problem Relation Age of Onset  . Diabetes Mother   . Coronary artery disease Father   . Hypertension Father   . Hyperlipidemia Father   . Leukemia Father    Social History   Socioeconomic History  . Marital status: Married    Spouse name: Vaughan Basta  . Number of children: 2  . Years of education: some college  . Highest education level: 12th grade  Occupational History  . Occupation: Retired  Scientific laboratory technician  . Financial resource strain: Not hard at all  . Food insecurity:    Worry: Never true    Inability:  Never true  . Transportation needs:    Medical: No    Non-medical: No  Tobacco Use  . Smoking status: Never Smoker  . Smokeless tobacco: Never Used  . Tobacco comment: smoking cessation materials not required  Substance and Sexual Activity  . Alcohol use: No    Alcohol/week: 0.0 standard drinks  . Drug use: No  . Sexual activity: Not Currently  Lifestyle  . Physical activity:    Days per week: 0 days    Minutes per session: 0 min  . Stress: Not at all  Relationships  . Social connections:    Talks on phone:  Patient refused    Gets together: Patient refused    Attends religious service: Patient refused    Active member of club or organization: Patient refused    Attends meetings of clubs or organizations: Patient refused    Relationship status: Married  Other Topics Concern  . Not on file  Social History Narrative   Caffeine in moderation    Outpatient Encounter Medications as of 09/09/2017  Medication Sig  . acetaminophen (TYLENOL) 500 MG tablet Take 500 mg by mouth every 6 (six) hours as needed.  [name] aspirin 81 MG tablet Take 81 mg by mouth daily.    [name] atenolol (TENORMIN) 50 MG tablet Take 1 tablet (50 mg total) by mouth at bedtime.  . cholecalciferol (VITAMIN D) 1000 UNITS tablet Take 1 tablet by mouth daily.  [name] docusate sodium (COLACE) 100 MG capsule Take by mouth.  . finasteride (PROSCAR) 5 MG tablet Take 1 tablet (5 mg total) by mouth daily.  . fluticasone (FLONASE) 50 MCG/ACT nasal spray Place 2 sprays into both nostrils daily.  [name] glucose blood (ONE TOUCH ULTRA TEST) test strip Use as instructed  . losartan-hydrochlorothiazide (HYZAAR) 50-12.5 MG tablet Take 1 tablet by mouth daily.  . meloxicam (MOBIC) 15 MG tablet TK 1 T PO QD  . metFORMIN (GLUCOPHAGE-XR) 500 MG 24 hr tablet Take 1 tablet (500 mg total) by mouth 2 (two) times daily.  . Misc Natural Products (OSTEO BI-FLEX TRIPLE STRENGTH) TABS Take 2 tablets by mouth daily.  . Multiple Vitamins-Minerals (CENTRUM SILVER) tablet Take 1 tablet by mouth daily.    . mupirocin ointment (BACTROBAN) 2 % APPLY TO AFFECTED AREA 3 TIMES A DAY  . Omega-3 Fatty Acids (FISH OIL) 1000 MG CAPS Take 1 capsule by mouth daily at 2 PM. Reported on 12/29/2014  . ONETOUCH DELICA LANCETS 16X MISC 1 each by Does not apply route 2 (two) times daily.  . pantoprazole (PROTONIX) 40 MG tablet Take 1 tablet (40 mg total) by mouth daily.  . rosuvastatin (CRESTOR) 20 MG tablet Take 1 tablet (20 mg total) by mouth at bedtime.  . sitaGLIPtin (JANUVIA) 100 MG tablet  Take 1 tablet (100 mg total) by mouth daily.  . tadalafil (CIALIS) 20 MG tablet Take 20 mg by mouth daily.    . tamsulosin (FLOMAX) 0.4 MG CAPS capsule Take 1 capsule (0.4 mg total) by mouth daily at 2 PM.  . TOLAK 4 % CREA USE 1(ONE) APPLICATION(S) TOPICAL EVERY DAY   No facility-administered encounter medications on file as of 09/09/2017.     Activities of Daily Living In your present state of health, do you have any difficulty performing the following activities: 09/09/2017  Hearing? N  Comment denies hearing aids  Vision? N  Comment wears eyeglasses  Difficulty concentrating or making decisions? N  Walking or climbing stairs? Y  Comment fatigue  Dressing or  bathing? N  Doing errands, shopping? N  Preparing Food and eating ? N  Comment denies dentures  Using the Toilet? N  In the past six months, have you accidently leaked urine? N  Do you have problems with loss of bowel control? N  Managing your Medications? N  Managing your Finances? N  Housekeeping or managing your Housekeeping? N  Some recent data might be hidden    Patient Care Team: Glean Hess, MD as PCP - General (Internal Medicine) Jannet Mantis, MD as Consulting Physician (Dermatology) Leanor Kail, MD as Consulting Physician (Orthopedic Surgery) Baker Pierini, OD as Consulting Physician (Optometry) Reche Dixon, PA-C as Consulting Physician (Orthopedic Surgery)   Assessment:   This is a routine wellness examination for Maxwell Stanley.  Exercise Activities and Dietary recommendations Current Exercise Habits: The patient has a physically strenous job, but has no regular exercise apart from work., Exercise limited by: None identified  Goals    . DIET - INCREASE WATER INTAKE     Recommend to drink at least 6-8 8oz glasses of water per day.    . Increase water intake     Recommend drinking at least 3-4 glasses of water a day    . Weight (lb) < 200 lb (90.7 kg)     Wants  To lose some weight after  gaining over the summer.        Fall Risk Fall Risk  09/09/2017 08/25/2016 08/13/2015 08/13/2015 11/28/2014  Falls in the past year? No No No No Yes  Number falls in past yr: - - - - 1  Injury with Fall? - - - - No  Risk for fall due to : Impaired vision - - - -  Risk for fall due to: Comment wears eyeglasses - - - -  Follow up - - - - Falls prevention discussed   FALL RISK PREVENTION PERTAINING TO HOME: Is your home free of loose throw rugs in walkways, pet beds, electrical cords, etc? Yes Is there adequate lighting in your home to reduce risk of falls?  Yes Are there stairs in or around your home WITH handrails? Yes  ASSISTIVE DEVICES UTILIZED TO PREVENT FALLS: Use of a cane, walker or w/c? No Grab bars in the bathroom? Yes  Shower chair or a place to sit while bathing? Yes An elevated toilet seat or a handicapped toilet? Yes  Timed Get Up and Go Performed: Yes. Pt ambulated 10 feet within 9 sec. Gait stead-fast and without the use of an assistive device. No intervention required at this time. Fall risk prevention has been discussed.  Community Resource Referral:  [place] Referral not required at this time.   Depression Screen PHQ 2/9 Scores 09/09/2017 08/25/2016 08/13/2015 08/13/2015  PHQ - 2 Score 0 0 0 0  PHQ- 9 Score 0 - - -    Cognitive Function MMSE - Mini Mental State Exam 08/13/2015  Orientation to time 5  Orientation to Place 5  Registration 3  Attention/ Calculation 5  Recall 3  Language- name 2 objects 2     6CIT Screen 09/09/2017 08/25/2016  What Year? 0 points 0 points  What month? 0 points 0 points  What time? 0 points 0 points  Count back from 20 0 points 0 points  Months in reverse 0 points 0 points  Repeat phrase 0 points 0 points  Total Score 0 0    Immunization History  Administered Date(s) Administered  . Influenza, High Dose Seasonal PF 09/09/2017  .  Influenza,inj,Quad PF,6+ Mos 09/26/2014  . Influenza-Unspecified 10/13/2016  . Pneumococcal  Conjugate-13 10/26/2013  . Pneumococcal Polysaccharide-23 01/31/2011  . Tdap 10/03/2010  . Zoster 04/16/2000  . Zoster Recombinat (Shingrix) 08/12/2017    Qualifies for Shingles Vaccine? Yes. Zostavax completed 04/16/00. First dose of Shingrix given 08/12/17. Due for second dose of Shingrix. Education has been provided regarding the importance of this vaccine. Pt has been advised to call insurance company to determine out of pocket expense. Advised may also receive vaccine at local pharmacy or Health Dept. Verbalized acceptance and understanding.  Screening Tests Health Maintenance  Topic Date Due  . FOOT EXAM  12/11/2017  . HEMOGLOBIN A1C  02/19/2018  . OPHTHALMOLOGY EXAM  09/03/2018  . TETANUS/TDAP  10/02/2020  . INFLUENZA VACCINE  Completed  . PNA vac Low Risk Adult  Completed   Cancer Screenings: Lung: Low Dose CT Chest recommended if Age 75-80 years, 30 pack-year currently smoking OR have quit w/in 15years. Patient does not qualify. Colorectal: No longer required  Additional Screenings: Hepatitis C Screening: Does not qualify     Plan:  I have personally reviewed and addressed the Medicare Annual Wellness questionnaire and have noted the following in the patient's chart:  A. Medical and social history B. Use of alcohol, tobacco or illicit drugs  C. Current medications and supplements D. Functional ability and status E.  Nutritional status F.  Physical activity G. Advance directives H. List of other physicians I.  Hospitalizations, surgeries, and ER visits in previous 12 months J.  [place] such as hearing and vision if needed, cognitive and depression L. Referrals and appointments  In addition, I have reviewed and discussed with patient certain preventive protocols, quality metrics, and best practice recommendations. A written personalized care plan for preventive services as well as general preventive health recommendations were provided to patient.  Signed,    Aleatha Borer, LPN Nurse Health Advisor  MD Recommendations: Zostavax completed 04/16/00. First dose of Shingrix given 08/12/17. Due for second dose of Shingrix. Education has been provided regarding the importance of this vaccine. Pt has been advised to call insurance company to determine out of pocket expense. Advised may also receive vaccine at local pharmacy or Health Dept. Verbalized acceptance and understanding.

## 2017-09-09 NOTE — Patient Instructions (Signed)
[name] , Thank you for taking time to come for your Medicare Wellness Visit. I appreciate your ongoing commitment to your health goals. Please review the following plan we discussed and let me know if I can assist you in the future.   Screening recommendations/referrals: Colorectal Screening: No longer required  Vision and Dental Exams: Recommended annual ophthalmology exams for early detection of glaucoma and other disorders of the eye Recommended annual dental exams for proper oral hygiene  Diabetic Exams: Recommended annual diabetic eye exams for early detection of retinopathy Recommended annual diabetic foot exams for early detection of peripheral neuropathy.  Diabetic Eye Exam: Up to date Diabetic Foot Exam: Up to date  Vaccinations: Influenza vaccine: Completed today Pneumococcal vaccine: Up to date Tdap vaccine: Up to date Shingles vaccine: Please call your insurance company to determine your out of pocket expense for the Shingrix vaccine. You may also receive this vaccine at your local pharmacy or Health Dept.    Advanced directives: Please bring a copy of your POA (Power of Attorney) and/or Living Will to your next appointment.  Goals: Recommend to drink at least 6-8 8oz glasses of water per day.  Next appointment: Please schedule your Annual Wellness Visit with your Nurse Health Advisor in one year.  Preventive Care 36 Years and Older, Male Preventive care refers to lifestyle choices and visits with your health care provider that can promote health and wellness. What does preventive care include?  A yearly physical exam. This is also called an annual well check.  Dental exams once or twice a year.  Routine eye exams. Ask your health care provider how often you should have your eyes checked.  Personal lifestyle choices, including:  Daily care of your teeth and gums.  Regular physical activity.  Eating a healthy diet.  Avoiding tobacco and drug  use.  Limiting alcohol use.  Practicing safe sex.  Taking low doses of aspirin every day.  Taking vitamin and mineral supplements as recommended by your health care provider. What happens during an annual well check? The services and screenings done by your health care provider during your annual well check will depend on your age, overall health, lifestyle risk factors, and family history of disease. Counseling  Your health care provider may ask you questions about your:  Alcohol use.  Tobacco use.  Drug use.  Emotional well-being.  Home and relationship well-being.  Sexual activity.  Eating habits.  History of falls.  Memory and ability to understand (cognition).  Work and work Statistician. Screening  You may have the following tests or measurements:  Height, weight, and BMI.  Blood pressure.  Lipid and cholesterol levels. These may be checked every 5 years, or more frequently if you are over 82 years old.  Skin check.  Lung cancer screening. You may have this screening every year starting at age 89 if you have a 30-pack-year history of smoking and currently smoke or have quit within the past 15 years.  Fecal occult blood test (FOBT) of the stool. You may have this test every year starting at age 80.  Flexible sigmoidoscopy or colonoscopy. You may have a sigmoidoscopy every 5 years or a colonoscopy every 10 years starting at age 12.  Prostate cancer screening. Recommendations will vary depending on your family history and other risks.  Hepatitis C blood test.  Hepatitis B blood test.  Sexually transmitted disease (STD) testing.  Diabetes screening. This is done by checking your blood sugar (glucose) after you have not  eaten for a while (fasting). You may have this done every 1-3 years.  Abdominal aortic aneurysm (AAA) screening. You may need this if you are a current or former smoker.  Osteoporosis. You may be screened starting at age 84 if you are at  high risk. Talk with your health care provider about your test results, treatment options, and if necessary, the need for more tests. Vaccines  Your health care provider may recommend certain vaccines, such as:  Influenza vaccine. This is recommended every year.  Tetanus, diphtheria, and acellular pertussis (Tdap, Td) vaccine. You may need a Td booster every 10 years.  Zoster vaccine. You may need this after age 39.  Pneumococcal 13-valent conjugate (PCV13) vaccine. One dose is recommended after age 15.  Pneumococcal polysaccharide (PPSV23) vaccine. One dose is recommended after age 18. Talk to your health care provider about which screenings and vaccines you need and how often you need them. This information is not intended to replace advice given to you by your health care provider. Make sure you discuss any questions you have with your health care provider. Document Released: 01/19/2015 Document Revised: 09/12/2015 Document Reviewed: 10/24/2014 Elsevier Interactive Patient Education  2017 [place] Prevention in the Home Falls can cause injuries. They can happen to people of all ages. There are many things you can do to make your home safe and to help prevent falls. What can I do on the outside of my home?  Regularly fix the edges of walkways and driveways and fix any cracks.  Remove anything that might make you trip as you walk through a door, such as a raised step or threshold.  Trim any bushes or trees on the path to your home.  Use bright outdoor lighting.  Clear any walking paths of anything that might make someone trip, such as rocks or tools.  Regularly check to see if handrails are loose or broken. Make sure that both sides of any steps have handrails.  Any raised decks and porches should have guardrails on the edges.  Have any leaves, snow, or ice cleared regularly.  Use sand or salt on walking paths during winter.  Clean up any spills in your garage  right away. This includes oil or grease spills. What can I do in the bathroom?  Use night lights.  Install grab bars by the toilet and in the tub and shower. Do not use towel bars as grab bars.  Use non-skid mats or decals in the tub or shower.  If you need to sit down in the shower, use a plastic, non-slip stool.  Keep the floor dry. Clean up any water that spills on the floor as soon as it happens.  Remove soap buildup in the tub or shower regularly.  Attach bath mats securely with double-sided non-slip rug tape.  Do not have throw rugs and other things on the floor that can make you trip. What can I do in the bedroom?  Use night lights.  Make sure that you have a light by your bed that is easy to reach.  Do not use any sheets or blankets that are too big for your bed. They should not hang down onto the floor.  Have a firm chair that has side arms. You can use this for support while you get dressed.  Do not have throw rugs and other things on the floor that can make you trip. What can I do in the kitchen?  Clean up any spills  right away.  Avoid walking on wet floors.  Keep items that you use a lot in easy-to-reach places.  If you need to reach something above you, use a strong step stool that has a grab bar.  Keep electrical cords out of the way.  Do not use floor polish or wax that makes floors slippery. If you must use wax, use non-skid floor wax.  Do not have throw rugs and other things on the floor that can make you trip. What can I do with my stairs?  Do not leave any items on the stairs.  Make sure that there are handrails on both sides of the stairs and use them. Fix handrails that are broken or loose. Make sure that handrails are as long as the stairways.  Check any carpeting to make sure that it is firmly attached to the stairs. Fix any carpet that is loose or worn.  Avoid having throw rugs at the top or bottom of the stairs. If you do have throw rugs,  attach them to the floor with carpet tape.  Make sure that you have a light switch at the top of the stairs and the bottom of the stairs. If you do not have them, ask someone to add them for you. What else can I do to help prevent falls?  Wear shoes that:  Do not have high heels.  Have rubber bottoms.  Are comfortable and fit you well.  Are closed at the toe. Do not wear sandals.  If you use a stepladder:  Make sure that it is fully opened. Do not climb a closed stepladder.  Make sure that both sides of the stepladder are locked into place.  Ask someone to hold it for you, if possible.  Clearly mark and make sure that you can see:  Any grab bars or handrails.  First and last steps.  Where the edge of each step is.  Use tools that help you move around (mobility aids) if they are needed. These include:  Canes.  Walkers.  Scooters.  Crutches.  Turn on the lights when you go into a dark area. Replace any light bulbs as soon as they burn out.  Set up your furniture so you have a clear path. Avoid moving your furniture around.  If any of your floors are uneven, fix them.  If there are any pets around you, be aware of where they are.  Review your medicines with your doctor. Some medicines can make you feel dizzy. This can increase your chance of falling. Ask your doctor what other things that you can do to help prevent falls. This information is not intended to replace advice given to you by your health care provider. Make sure you discuss any questions you have with your health care provider. Document Released: 10/19/2008 Document Revised: 05/31/2015 Document Reviewed: 01/27/2014 Elsevier Interactive Patient Education  2017 [employer].

## 2017-11-19 DIAGNOSIS — L570 Actinic keratosis: Secondary | ICD-10-CM | POA: Diagnosis not present

## 2017-11-19 DIAGNOSIS — C44622 Squamous cell carcinoma of skin of right upper limb, including shoulder: Secondary | ICD-10-CM | POA: Diagnosis not present

## 2017-11-19 DIAGNOSIS — D485 Neoplasm of uncertain behavior of skin: Secondary | ICD-10-CM | POA: Diagnosis not present

## 2017-11-19 DIAGNOSIS — L0109 Other impetigo: Secondary | ICD-10-CM | POA: Diagnosis not present

## 2017-11-27 DIAGNOSIS — R69 Illness, unspecified: Secondary | ICD-10-CM | POA: Diagnosis not present

## 2017-12-02 DIAGNOSIS — R69 Illness, unspecified: Secondary | ICD-10-CM | POA: Diagnosis not present

## 2017-12-22 ENCOUNTER — Encounter: Payer: Self-pay | Admitting: Internal Medicine

## 2017-12-22 ENCOUNTER — Ambulatory Visit (INDEPENDENT_AMBULATORY_CARE_PROVIDER_SITE_OTHER): Payer: Medicare HMO | Admitting: Internal Medicine

## 2017-12-22 VITALS — BP 110/70 | HR 59 | Ht 74.0 in | Wt 205.0 lb

## 2017-12-22 DIAGNOSIS — E1121 Type 2 diabetes mellitus with diabetic nephropathy: Secondary | ICD-10-CM

## 2017-12-22 DIAGNOSIS — R3912 Poor urinary stream: Secondary | ICD-10-CM

## 2017-12-22 DIAGNOSIS — E1169 Type 2 diabetes mellitus with other specified complication: Secondary | ICD-10-CM | POA: Diagnosis not present

## 2017-12-22 DIAGNOSIS — K2100 Gastro-esophageal reflux disease with esophagitis, without bleeding: Secondary | ICD-10-CM

## 2017-12-22 DIAGNOSIS — E1122 Type 2 diabetes mellitus with diabetic chronic kidney disease: Secondary | ICD-10-CM | POA: Diagnosis not present

## 2017-12-22 DIAGNOSIS — M170 Bilateral primary osteoarthritis of knee: Secondary | ICD-10-CM

## 2017-12-22 DIAGNOSIS — N183 Chronic kidney disease, stage 3 unspecified: Secondary | ICD-10-CM

## 2017-12-22 DIAGNOSIS — E785 Hyperlipidemia, unspecified: Secondary | ICD-10-CM | POA: Diagnosis not present

## 2017-12-22 DIAGNOSIS — Z0000 Encounter for general adult medical examination without abnormal findings: Secondary | ICD-10-CM

## 2017-12-22 DIAGNOSIS — N401 Enlarged prostate with lower urinary tract symptoms: Secondary | ICD-10-CM

## 2017-12-22 DIAGNOSIS — I10 Essential (primary) hypertension: Secondary | ICD-10-CM

## 2017-12-22 LAB — POCT URINALYSIS DIPSTICK
Bilirubin, UA: NEGATIVE
Blood, UA: 6
GLUCOSE UA: NEGATIVE
Ketones, UA: NEGATIVE
Leukocytes, UA: NEGATIVE
Nitrite, UA: NEGATIVE
Protein, UA: NEGATIVE
Spec Grav, UA: 1.015 (ref 1.010–1.025)
Urobilinogen, UA: 0.2 E.U./dL
pH, UA: 6 (ref 5.0–8.0)

## 2017-12-22 MED ORDER — ROSUVASTATIN CALCIUM 20 MG PO TABS: 20.0000 mg | ORAL_TABLET | Freq: Every day | ORAL | 3 refills | Status: DC

## 2017-12-22 MED ORDER — ATENOLOL 50 MG PO TABS: 50.0000 mg | ORAL_TABLET | Freq: Every day | ORAL | 3 refills | Status: DC

## 2017-12-22 MED ORDER — PANTOPRAZOLE SODIUM 40 MG PO TBEC: 40.0000 mg | DELAYED_RELEASE_TABLET | Freq: Every day | ORAL | 3 refills | Status: DC

## 2017-12-22 MED ORDER — LOSARTAN POTASSIUM-HCTZ 50-12.5 MG PO TABS: 1.0000 | ORAL_TABLET | Freq: Every day | ORAL | 3 refills | Status: DC

## 2017-12-22 NOTE — Progress Notes (Signed)
Date:  12/22/2017   Name:  Maxwell Stanley   DOB:  12-02-1934   MRN:  [id number]   Chief Complaint: Annual Exam; Hypertension; and Hyperlipidemia Maxwell Stanley is a 83 y.o. male who presents today for his Complete Annual Exam. He feels fairly well. He reports exercising none due to knee pain. He reports he is sleeping well. He is up to date on immunizations.  Hypertension  This is a chronic problem. The problem is controlled. Associated symptoms include shortness of breath. Pertinent negatives include no chest pain, headaches, orthopnea, palpitations or peripheral edema. Risk factors for coronary artery disease include diabetes mellitus and dyslipidemia. Past treatments include beta blockers, angiotensin blockers and diuretics. The current treatment provides significant improvement.  Diabetes  He presents for his follow-up diabetic visit. He has type 2 diabetes mellitus. His disease course has been stable. Pertinent negatives for hypoglycemia include no dizziness or headaches. Pertinent negatives for diabetes include no chest pain and no fatigue. Current diabetic treatment includes oral agent (dual therapy). He is compliant with treatment all of the time. An ACE inhibitor/angiotensin II receptor blocker is being taken. Eye exam is current.  Hyperlipidemia  This is a chronic problem. The problem is controlled. Associated symptoms include shortness of breath. Pertinent negatives include no chest pain or myalgias. Current antihyperlipidemic treatment includes statins. The current treatment provides significant improvement of lipids.  Benign Prostatic Hypertrophy  This is a chronic problem. The problem is unchanged. Irritative symptoms include frequency. Irritative symptoms do not include urgency. Pertinent negatives include no chills, dysuria or hematuria. Past treatments include tamsulosin and finasteride (no longer followed by Urology). The treatment provided significant relief.  Knee Pain    There was no injury mechanism (initially but fell last week on left knee). The pain is present in the left knee. The pain is moderate. The pain has been fluctuating since onset. He has tried NSAIDs for the symptoms. The treatment provided mild relief.  Gastroesophageal Reflux  He reports no abdominal pain, no chest pain, no choking or no wheezing. Pertinent negatives include no fatigue.   Lab Results  Component Value Date   HGBA1C 7.8 (H) 08/19/2017    Review of Systems  Constitutional: Negative for appetite change, chills, diaphoresis, fatigue and unexpected weight change.  HENT: Positive for trouble swallowing (occasional tightness in throat). Negative for hearing loss, tinnitus and voice change.   Eyes: Negative for visual disturbance.  Respiratory: Positive for shortness of breath. Negative for choking and wheezing.   Cardiovascular: Negative for chest pain, palpitations, orthopnea and leg swelling.  Gastrointestinal: Negative for abdominal pain, blood in stool, constipation and diarrhea.  Genitourinary: Positive for frequency. Negative for difficulty urinating, dysuria, hematuria, scrotal swelling, testicular pain and urgency.  Musculoskeletal: Positive for arthralgias and gait problem. Negative for back pain and myalgias.  Skin: Negative for color change and rash.  Allergic/Immunologic: Negative for environmental allergies.  Neurological: Negative for dizziness, syncope and headaches.  Hematological: Negative for adenopathy.  Psychiatric/Behavioral: Negative for dysphoric mood and sleep disturbance.    Patient Active Problem List   Diagnosis Date Noted  . Onychomycosis of toenail 08/19/2017  . Allergic rhinitis 08/08/2016  . Carpal tunnel syndrome on left 12/12/2015  . Chronic maxillary sinusitis 04/09/2015  . Type 2 diabetes mellitus with diabetic nephropathy, without long-term current use of insulin ([place]) 11/28/2014  . LBP (low back pain) 09/21/2014  . Lumbar canal  stenosis 09/21/2014  . Abnormal prostate specific antigen 04/17/2014  . Enlarged prostate with  lower urinary tract symptoms (LUTS) 04/17/2014  . CKD stage 3 secondary to diabetes ([place]) 04/17/2014  . Hyperlipidemia associated with type 2 diabetes mellitus ([place]) 04/17/2014  . 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 Essential (primary) hypertension 04/17/2014  . Esophagitis, reflux 04/17/2014  . Current use of proton pump inhibitor 04/17/2014  . Back muscle spasm 04/17/2014  . Degenerative arthritis of finger 04/17/2014  . Foot pain, left 04/17/2014  . Breast development in males 04/17/2014  . 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 Osteoarthritis of both knees 11/03/2013  . EDEMA 08/24/2009    Allergies  Allergen Reactions  . Amlodipine     Other reaction(s): Edema  . Codeine   . Penicillin G Rash  . Rocephin  [Ceftriaxone] Rash    Past Surgical History:  Procedure Laterality Date  . ANKLE ARTHROSCOPY WITH OPEN REDUCTION INTERNAL FIXATION (ORIF) Left 1998  . HERNIA REPAIR Left 7628   umbilical and left inguinal  . KNEE ARTHROSCOPY Right 2006  . THUMB ARTHROSCOPY Right 2013   tendon flap to joint    Social History   Tobacco Use  . Smoking status: Never Smoker  . Smokeless tobacco: Never Used  . Tobacco comment: smoking cessation materials not required  Substance Use Topics  . Alcohol use: No    Alcohol/week: 0.0 standard drinks  . Drug use: No     Medication list has been reviewed and updated.  Current Meds  Medication Sig  . acetaminophen (TYLENOL) 500 MG tablet Take 500 mg by mouth every 6 (six) hours as needed.  [name] aspirin 81 MG tablet Take 81 mg by mouth daily.    [name] atenolol (TENORMIN) 50 MG tablet Take 1 tablet (50 mg total) by mouth at bedtime.  . cholecalciferol (VITAMIN D) 1000 UNITS tablet Take 1 tablet by mouth daily.  [name] docusate sodium (COLACE) 100 MG capsule Take by mouth.  . finasteride (PROSCAR) 5 MG tablet Take 1 tablet (5 mg total) by mouth  daily.  . fluticasone (FLONASE) 50 MCG/ACT nasal spray Place 2 sprays into both nostrils daily.  [name] glucose blood (ONE TOUCH ULTRA TEST) test strip Use as instructed  . losartan-hydrochlorothiazide (HYZAAR) 50-12.5 MG tablet Take 1 tablet by mouth daily.  . meloxicam (MOBIC) 15 MG tablet TK 1 T PO QD  . metFORMIN (GLUCOPHAGE-XR) 500 MG 24 hr tablet Take 1 tablet (500 mg total) by mouth 2 (two) times daily.  . Misc Natural Products (OSTEO BI-FLEX TRIPLE STRENGTH) TABS Take 2 tablets by mouth daily.  . Multiple Vitamins-Minerals (CENTRUM SILVER) tablet Take 1 tablet by mouth daily.    . mupirocin ointment (BACTROBAN) 2 % APPLY TO AFFECTED AREA 3 TIMES A DAY  . Omega-3 Fatty Acids (FISH OIL) 1000 MG CAPS Take 1 capsule by mouth daily at 2 PM. Reported on 12/29/2014  . ONETOUCH DELICA LANCETS 31D MISC 1 each by Does not apply route 2 (two) times daily.  . pantoprazole (PROTONIX) 40 MG tablet Take 1 tablet (40 mg total) by mouth daily.  . rosuvastatin (CRESTOR) 20 MG tablet Take 1 tablet (20 mg total) by mouth at bedtime.  . sitaGLIPtin (JANUVIA) 100 MG tablet Take 1 tablet (100 mg total) by mouth daily.  . tadalafil (CIALIS) 20 MG tablet Take 20 mg by mouth daily.    . tamsulosin (FLOMAX) 0.4 MG CAPS capsule Take 1 capsule (0.4 mg total) by mouth daily at 2 PM.  . TOLAK 4 % CREA USE 1(ONE) APPLICATION(S) TOPICAL EVERY DAY  PHQ 2/9 Scores 09/09/2017 08/25/2016 08/13/2015 08/13/2015  PHQ - 2 Score 0 0 0 0  PHQ- 9 Score 0 - - -    Physical Exam Vitals signs and nursing note reviewed.  Constitutional:      Appearance: Normal appearance. He is well-developed.  HENT:     Head: Normocephalic.     Right Ear: Tympanic membrane, ear canal and external ear normal.     Left Ear: Tympanic membrane, ear canal and external ear normal.     Nose: Nose normal.     Mouth/Throat:     Pharynx: Uvula midline.  Eyes:     Conjunctiva/sclera: Conjunctivae normal.     Pupils: Pupils are equal, round, and reactive  to light.  Neck:     Musculoskeletal: Normal range of motion and neck supple.     Thyroid: No thyromegaly.     Vascular: No carotid bruit.  Cardiovascular:     Rate and Rhythm: Normal rate and regular rhythm.     Heart sounds: Normal heart sounds.  Pulmonary:     Effort: Pulmonary effort is normal.     Breath sounds: Normal breath sounds. No wheezing.  Chest:     Breasts:        Right: No mass.        Left: No mass.  Abdominal:     General: Bowel sounds are normal.     Palpations: Abdomen is soft.     Tenderness: There is no abdominal tenderness.  Musculoskeletal:     Right knee: He exhibits decreased range of motion. He exhibits no swelling and no effusion. Tenderness found.     Left knee: He exhibits decreased range of motion and swelling. He exhibits no effusion. Tenderness found.     Right lower leg: No edema.     Left lower leg: No edema.  Lymphadenopathy:     Cervical: No cervical adenopathy.  Skin:    General: Skin is warm and dry.     Comments: Fungal nails improving   Neurological:     Mental Status: He is alert and oriented to person, place, and time.     Deep Tendon Reflexes: Reflexes are normal and symmetric.  Psychiatric:        Attention and Perception: Attention normal.        Speech: Speech normal.        Behavior: Behavior normal.        Thought Content: Thought content normal.        Judgment: Judgment normal.    Wt Readings from Last 3 Encounters:  12/22/17 205 lb (93 kg)  09/09/17 200 lb 9.6 oz (91 kg)  08/19/17 204 lb (92.5 kg)     BP 110/70 (BP Location: Right Arm, Patient Position: Sitting, Cuff Size: Normal)   Pulse (!) 59   Ht 6\' 2"  (1.88 m)   Wt 205 lb (93 kg)   SpO2 96%   BMI 26.32 kg/m   Assessment and Plan: 1. Annual physical exam Recommend resuming regular walking - POCT urinalysis dipstick  2. Essential (primary) hypertension controlled - CBC with Differential/Platelet - atenolol (TENORMIN) 50 MG tablet; Take 1 tablet (50  mg total) by mouth at bedtime.  Dispense: 90 tablet; Refill: 3 - losartan-hydrochlorothiazide (HYZAAR) 50-12.5 MG tablet; Take 1 tablet by mouth daily.  Dispense: 90 tablet; Refill: 3  3. Type 2 diabetes mellitus with diabetic nephropathy, without long-term current use of insulin ([place]) Fair control Consider combo Januvia + SGLT-2 if needed for  cost savings - Comprehensive metabolic panel - Hemoglobin A1c - TSH  4. Hyperlipidemia associated with type 2 diabetes mellitus ([place]) Continue crestor - Lipid panel - rosuvastatin (CRESTOR) 20 MG tablet; Take 1 tablet (20 mg total) by mouth at bedtime.  Dispense: 90 tablet; Refill: 3  5. CKD stage 3 secondary to diabetes (H[facility]) Monitor closely with nsaid use - Comprehensive metabolic panel  6. Benign prostatic hyperplasia with weak urinary stream DRE deferred - PSA  7. Esophagitis, reflux On daily PPI - pantoprazole (PROTONIX) 40 MG tablet; Take 1 tablet (40 mg total) by mouth daily.  Dispense: 90 tablet; Refill: 3  8. Primary osteoarthritis of both knees Continue Mobic sparingly Seeing Ortho for evaluation and possible cortisone injection this week   Partially dictated using Editor, commissioning. Any errors are unintentional.  Halina Maidens, MD [facility]  12/22/2017

## 2017-12-23 DIAGNOSIS — G8929 Other chronic pain: Secondary | ICD-10-CM | POA: Diagnosis not present

## 2017-12-23 DIAGNOSIS — M25562 Pain in left knee: Secondary | ICD-10-CM | POA: Diagnosis not present

## 2017-12-23 DIAGNOSIS — M545 Low back pain: Secondary | ICD-10-CM | POA: Diagnosis not present

## 2017-12-23 DIAGNOSIS — M5136 Other intervertebral disc degeneration, lumbar region: Secondary | ICD-10-CM | POA: Diagnosis not present

## 2017-12-23 DIAGNOSIS — M1611 Unilateral primary osteoarthritis, right hip: Secondary | ICD-10-CM | POA: Diagnosis not present

## 2017-12-23 DIAGNOSIS — E1121 Type 2 diabetes mellitus with diabetic nephropathy: Secondary | ICD-10-CM | POA: Diagnosis not present

## 2017-12-23 DIAGNOSIS — M170 Bilateral primary osteoarthritis of knee: Secondary | ICD-10-CM | POA: Diagnosis not present

## 2017-12-23 DIAGNOSIS — M1612 Unilateral primary osteoarthritis, left hip: Secondary | ICD-10-CM | POA: Diagnosis not present

## 2017-12-23 LAB — COMPREHENSIVE METABOLIC PANEL
ALT: 19 IU/L (ref 0–44)
AST: 20 IU/L (ref 0–40)
Albumin/Globulin Ratio: 2.2 (ref 1.2–2.2)
Albumin: 4.6 g/dL (ref 3.5–4.7)
Alkaline Phosphatase: 81 IU/L (ref 39–117)
BILIRUBIN TOTAL: 0.6 mg/dL (ref 0.0–1.2)
BUN/Creatinine Ratio: 19 (ref 10–24)
BUN: 24 mg/dL (ref 8–27)
CHLORIDE: 100 mmol/L (ref 96–106)
CO2: 25 mmol/L (ref 20–29)
Calcium: 9.8 mg/dL (ref 8.6–10.2)
Creatinine, Ser: 1.24 mg/dL (ref 0.76–1.27)
GFR calc Af Amer: 63 mL/min/{1.73_m2} (ref >59)
GFR calc non Af Amer: 54 mL/min/{1.73_m2} — ABNORMAL LOW (ref >59)
Globulin, Total: 2.1 g/dL (ref 1.5–4.5)
Glucose: 177 mg/dL — ABNORMAL HIGH (ref 65–99)
Potassium: 4.5 mmol/L (ref 3.5–5.2)
SODIUM: 138 mmol/L (ref 134–144)
Total Protein: 6.7 g/dL (ref 6.0–8.5)

## 2017-12-23 LAB — CBC WITH DIFFERENTIAL/PLATELET
BASOS: 1 %
Basophils Absolute: 0.1 10*3/uL (ref 0.0–0.2)
EOS (ABSOLUTE): 0.3 10*3/uL (ref 0.0–0.4)
EOS: 4 %
HEMATOCRIT: 46.2 % (ref 37.5–51.0)
Hemoglobin: 15.9 g/dL (ref 13.0–17.7)
Immature Grans (Abs): 0 10*3/uL (ref 0.0–0.1)
Immature Granulocytes: 0 %
LYMPHS ABS: 2.4 10*3/uL (ref 0.7–3.1)
Lymphs: 33 %
MCH: 33.8 pg — ABNORMAL HIGH (ref 26.6–33.0)
MCHC: 34.4 g/dL (ref 31.5–35.7)
MCV: 98 fL — AB (ref 79–97)
MONOS ABS: 0.7 10*3/uL (ref 0.1–0.9)
Monocytes: 9 %
Neutrophils Absolute: 3.9 10*3/uL (ref 1.4–7.0)
Neutrophils: 53 %
Platelets: 209 10*3/uL (ref 150–450)
RBC: 4.71 x10E6/uL (ref 4.14–5.80)
RDW: 12.6 % (ref 12.3–15.4)
WBC: 7.3 10*3/uL (ref 3.4–10.8)

## 2017-12-23 LAB — TSH: TSH: 2.42 u[IU]/mL (ref 0.450–4.500)

## 2017-12-23 LAB — PSA: Prostate Specific Ag, Serum: 0.9 ng/mL (ref 0.0–4.0)

## 2017-12-23 LAB — LIPID PANEL
Chol/HDL Ratio: 4.3 ratio (ref 0.0–5.0)
Cholesterol, Total: 130 mg/dL (ref 100–199)
HDL: 30 mg/dL — ABNORMAL LOW (ref >39)
LDL Calculated: 48 mg/dL (ref 0–99)
Triglycerides: 262 mg/dL — ABNORMAL HIGH (ref 0–149)
VLDL Cholesterol Cal: 52 mg/dL — ABNORMAL HIGH (ref 5–40)

## 2017-12-23 LAB — HEMOGLOBIN A1C
ESTIMATED AVERAGE GLUCOSE: 166 mg/dL
Hgb A1c MFr Bld: 7.4 % — ABNORMAL HIGH (ref 4.8–5.6)

## 2017-12-24 ENCOUNTER — Telehealth: Payer: Self-pay

## 2017-12-24 ENCOUNTER — Other Ambulatory Visit: Payer: Self-pay | Admitting: Internal Medicine

## 2017-12-24 DIAGNOSIS — Z859 Personal history of malignant neoplasm, unspecified: Secondary | ICD-10-CM | POA: Diagnosis not present

## 2017-12-24 DIAGNOSIS — L570 Actinic keratosis: Secondary | ICD-10-CM | POA: Diagnosis not present

## 2017-12-24 DIAGNOSIS — L0109 Other impetigo: Secondary | ICD-10-CM | POA: Diagnosis not present

## 2017-12-24 DIAGNOSIS — Z87898 Personal history of other specified conditions: Secondary | ICD-10-CM

## 2017-12-24 MED ORDER — SCOPOLAMINE 1 MG/3DAYS TD PT72: 1.0000 | MEDICATED_PATCH | TRANSDERMAL | 0 refills | Status: DC

## 2017-12-24 NOTE — Telephone Encounter (Signed)
Advised lab results. Wants Transderm Patches for cruise in January. Has had in past. No need to call him just send to [facility] unless there is issue.

## 2018-01-08 DIAGNOSIS — M25551 Pain in right hip: Secondary | ICD-10-CM | POA: Diagnosis not present

## 2018-01-08 DIAGNOSIS — M25552 Pain in left hip: Secondary | ICD-10-CM | POA: Diagnosis not present

## 2018-01-08 DIAGNOSIS — E1121 Type 2 diabetes mellitus with diabetic nephropathy: Secondary | ICD-10-CM | POA: Diagnosis not present

## 2018-01-26 DIAGNOSIS — R69 Illness, unspecified: Secondary | ICD-10-CM | POA: Diagnosis not present

## 2018-01-27 DIAGNOSIS — L570 Actinic keratosis: Secondary | ICD-10-CM | POA: Diagnosis not present

## 2018-02-04 DIAGNOSIS — M1611 Unilateral primary osteoarthritis, right hip: Secondary | ICD-10-CM | POA: Diagnosis not present

## 2018-02-04 DIAGNOSIS — M1612 Unilateral primary osteoarthritis, left hip: Secondary | ICD-10-CM | POA: Diagnosis not present

## 2018-02-04 DIAGNOSIS — G8929 Other chronic pain: Secondary | ICD-10-CM | POA: Diagnosis not present

## 2018-02-04 DIAGNOSIS — M5440 Lumbago with sciatica, unspecified side: Secondary | ICD-10-CM | POA: Diagnosis not present

## 2018-02-04 DIAGNOSIS — M545 Low back pain: Secondary | ICD-10-CM | POA: Diagnosis not present

## 2018-02-05 ENCOUNTER — Other Ambulatory Visit: Payer: Self-pay | Admitting: Unknown Physician Specialty

## 2018-02-05 DIAGNOSIS — M5440 Lumbago with sciatica, unspecified side: Secondary | ICD-10-CM

## 2018-02-05 DIAGNOSIS — M25551 Pain in right hip: Secondary | ICD-10-CM

## 2018-02-05 DIAGNOSIS — M25552 Pain in left hip: Secondary | ICD-10-CM

## 2018-02-09 DIAGNOSIS — R69 Illness, unspecified: Secondary | ICD-10-CM | POA: Diagnosis not present

## 2018-02-11 ENCOUNTER — Ambulatory Visit
Admission: RE | Admit: 2018-02-11 | Discharge: 2018-02-11 | Disposition: A | Discharge status: Home / Self Care | Payer: Medicare HMO | Source: Ambulatory Visit | Attending: Unknown Physician Specialty | Admitting: Unknown Physician Specialty

## 2018-02-11 DIAGNOSIS — M5126 Other intervertebral disc displacement, lumbar region: Secondary | ICD-10-CM | POA: Diagnosis not present

## 2018-02-11 DIAGNOSIS — M25551 Pain in right hip: Secondary | ICD-10-CM

## 2018-02-11 DIAGNOSIS — M25552 Pain in left hip: Secondary | ICD-10-CM | POA: Insufficient documentation

## 2018-02-11 DIAGNOSIS — M47817 Spondylosis without myelopathy or radiculopathy, lumbosacral region: Secondary | ICD-10-CM | POA: Diagnosis not present

## 2018-02-11 DIAGNOSIS — M5440 Lumbago with sciatica, unspecified side: Secondary | ICD-10-CM | POA: Insufficient documentation

## 2018-02-11 DIAGNOSIS — M160 Bilateral primary osteoarthritis of hip: Secondary | ICD-10-CM | POA: Diagnosis not present

## 2018-02-11 DIAGNOSIS — M48061 Spinal stenosis, lumbar region without neurogenic claudication: Secondary | ICD-10-CM | POA: Diagnosis not present

## 2018-02-18 DIAGNOSIS — M1611 Unilateral primary osteoarthritis, right hip: Secondary | ICD-10-CM | POA: Diagnosis not present

## 2018-02-18 DIAGNOSIS — M48061 Spinal stenosis, lumbar region without neurogenic claudication: Secondary | ICD-10-CM | POA: Diagnosis not present

## 2018-02-18 DIAGNOSIS — M1712 Unilateral primary osteoarthritis, left knee: Secondary | ICD-10-CM | POA: Diagnosis not present

## 2018-02-20 ENCOUNTER — Other Ambulatory Visit: Payer: Self-pay | Admitting: Internal Medicine

## 2018-02-20 DIAGNOSIS — E1121 Type 2 diabetes mellitus with diabetic nephropathy: Secondary | ICD-10-CM

## 2018-02-22 DIAGNOSIS — L570 Actinic keratosis: Secondary | ICD-10-CM | POA: Diagnosis not present

## 2018-03-05 DIAGNOSIS — M1612 Unilateral primary osteoarthritis, left hip: Secondary | ICD-10-CM | POA: Diagnosis not present

## 2018-03-15 ENCOUNTER — Other Ambulatory Visit: Payer: Self-pay

## 2018-03-15 DIAGNOSIS — L570 Actinic keratosis: Secondary | ICD-10-CM | POA: Diagnosis not present

## 2018-03-15 MED ORDER — HYDROCHLOROTHIAZIDE 12.5 MG PO CAPS: 12.5000 mg | ORAL_CAPSULE | Freq: Every day | ORAL | 1 refills | Status: DC

## 2018-03-15 MED ORDER — LOSARTAN POTASSIUM 50 MG PO TABS: 50.0000 mg | ORAL_TABLET | Freq: Every day | ORAL | 1 refills | Status: DC

## 2018-03-24 DIAGNOSIS — M48062 Spinal stenosis, lumbar region with neurogenic claudication: Secondary | ICD-10-CM | POA: Diagnosis not present

## 2018-03-24 DIAGNOSIS — M5136 Other intervertebral disc degeneration, lumbar region: Secondary | ICD-10-CM | POA: Diagnosis not present

## 2018-03-24 DIAGNOSIS — M5416 Radiculopathy, lumbar region: Secondary | ICD-10-CM | POA: Diagnosis not present

## 2018-03-30 ENCOUNTER — Other Ambulatory Visit: Payer: Self-pay | Admitting: Internal Medicine

## 2018-03-30 DIAGNOSIS — R69 Illness, unspecified: Secondary | ICD-10-CM | POA: Diagnosis not present

## 2018-04-06 DIAGNOSIS — C44622 Squamous cell carcinoma of skin of right upper limb, including shoulder: Secondary | ICD-10-CM | POA: Diagnosis not present

## 2018-04-07 HISTORY — PX: OTHER SURGICAL HISTORY: SHX169

## 2018-04-09 ENCOUNTER — Other Ambulatory Visit: Payer: Self-pay | Admitting: Internal Medicine

## 2018-04-09 DIAGNOSIS — E1121 Type 2 diabetes mellitus with diabetic nephropathy: Secondary | ICD-10-CM

## 2018-04-14 DIAGNOSIS — C44622 Squamous cell carcinoma of skin of right upper limb, including shoulder: Secondary | ICD-10-CM | POA: Diagnosis not present

## 2018-04-21 ENCOUNTER — Encounter: Payer: Self-pay | Admitting: Internal Medicine

## 2018-04-21 ENCOUNTER — Other Ambulatory Visit: Payer: Self-pay

## 2018-04-21 ENCOUNTER — Ambulatory Visit (INDEPENDENT_AMBULATORY_CARE_PROVIDER_SITE_OTHER): Payer: Medicare HMO | Admitting: Internal Medicine

## 2018-04-21 VITALS — BP 128/70 | HR 73 | Ht 74.0 in | Wt 196.0 lb

## 2018-04-21 DIAGNOSIS — I10 Essential (primary) hypertension: Secondary | ICD-10-CM

## 2018-04-21 DIAGNOSIS — E785 Hyperlipidemia, unspecified: Secondary | ICD-10-CM

## 2018-04-21 DIAGNOSIS — E1169 Type 2 diabetes mellitus with other specified complication: Secondary | ICD-10-CM | POA: Diagnosis not present

## 2018-04-21 DIAGNOSIS — E1121 Type 2 diabetes mellitus with diabetic nephropathy: Secondary | ICD-10-CM | POA: Diagnosis not present

## 2018-04-21 NOTE — Progress Notes (Signed)
Date:  04/21/2018   Name:  Maxwell Stanley   DOB:  1934/11/29   MRN:  [id number]   Chief Complaint: Hypertension (4 month follow up. ) and Diabetes  Diabetes  He presents for his follow-up diabetic visit. He has type 2 diabetes mellitus. His disease course has been stable. Pertinent negatives for hypoglycemia include no dizziness, headaches or tremors. Pertinent negatives for diabetes include no chest pain, no fatigue, no polydipsia and no polyuria. Current diabetic treatment includes oral agent (dual therapy) Celesta Gentile, metformin). He is compliant with treatment all of the time. He monitors blood glucose at home 1-2 x per day. His breakfast blood glucose is taken between 7-8 am. His breakfast blood glucose range is generally 140-180 mg/dl. An ACE inhibitor/angiotensin II receptor blocker is being taken. Eye exam is current.  Hypertension  This is a chronic problem. The problem is controlled. Pertinent negatives include no chest pain, headaches, palpitations or shortness of breath. Past treatments include angiotensin blockers and diuretics. The current treatment provides significant improvement.  Hyperlipidemia  The problem is controlled. Pertinent negatives include no chest pain or shortness of breath. Current antihyperlipidemic treatment includes statins. The current treatment provides significant improvement of lipids.   Lab Results  Component Value Date   HGBA1C 7.4 (H) 12/22/2017   Lab Results  Component Value Date   CREATININE 1.24 12/22/2017   BUN 24 12/22/2017   NA 138 12/22/2017   K 4.5 12/22/2017   CL 100 12/22/2017   CO2 25 12/22/2017   Lab Results  Component Value Date   CHOL 130 12/22/2017   HDL 30 (L) 12/22/2017   LDLCALC 48 12/22/2017   TRIG 262 (H) 12/22/2017   CHOLHDL 4.3 12/22/2017     Review of Systems  Constitutional: Negative for appetite change, fatigue and unexpected weight change.  Eyes: Negative for visual disturbance.  Respiratory: Negative for  cough, shortness of breath and wheezing.   Cardiovascular: Negative for chest pain, palpitations and leg swelling.  Gastrointestinal: Negative for abdominal pain and blood in stool.  Endocrine: Negative for polydipsia and polyuria.  Genitourinary: Negative for dysuria and hematuria.  Musculoskeletal: Positive for arthralgias (hips and knees - seeing ortho, has cortisone injections).  Skin: Negative for color change and rash.  Neurological: Negative for dizziness, tremors, numbness and headaches.  Psychiatric/Behavioral: Negative for dysphoric mood.    Patient Active Problem List   Diagnosis Date Noted  . Onychomycosis of toenail 08/19/2017  . Allergic rhinitis 08/08/2016  . Carpal tunnel syndrome on left 12/12/2015  . Chronic maxillary sinusitis 04/09/2015  . Type 2 diabetes mellitus with diabetic nephropathy, without long-term current use of insulin ([place]) 11/28/2014  . LBP (low back pain) 09/21/2014  . Lumbar canal stenosis 09/21/2014  . Abnormal prostate specific antigen 04/17/2014  . Enlarged prostate with lower urinary tract symptoms (LUTS) 04/17/2014  . CKD stage 3 secondary to diabetes ([place]) 04/17/2014  . Hyperlipidemia associated with type 2 diabetes mellitus ([place]) 04/17/2014  . 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 Essential (primary) hypertension 04/17/2014  . Esophagitis, reflux 04/17/2014  . Current use of proton pump inhibitor 04/17/2014  . Back muscle spasm 04/17/2014  . Degenerative arthritis of finger 04/17/2014  . Foot pain, left 04/17/2014  . Breast development in males 04/17/2014  . 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 Osteoarthritis of both knees 11/03/2013  . EDEMA 08/24/2009    Allergies  Allergen Reactions  . Amlodipine     Other reaction(s): Edema  . Codeine   .  Penicillin G Rash  . Rocephin  [Ceftriaxone] Rash    Past Surgical History:  Procedure Laterality Date  . ANKLE ARTHROSCOPY WITH OPEN REDUCTION INTERNAL FIXATION (ORIF) Left 1998   . HERNIA REPAIR Left 0240   umbilical and left inguinal  . KNEE ARTHROSCOPY Right 2006  . MOHS SURGERY     ON hand.  [name] THUMB ARTHROSCOPY Right 2013   tendon flap to joint    Social History   Tobacco Use  . Smoking status: Never Smoker  . Smokeless tobacco: Never Used  . Tobacco comment: smoking cessation materials not required  Substance Use Topics  . Alcohol use: No    Alcohol/week: 0.0 standard drinks  . Drug use: No     Medication list has been reviewed and updated.  Current Meds  Medication Sig  . acetaminophen (TYLENOL) 500 MG tablet Take 500 mg by mouth every 6 (six) hours as needed.  [name] aspirin 81 MG tablet Take 81 mg by mouth daily.    [name] atenolol (TENORMIN) 50 MG tablet Take 1 tablet (50 mg total) by mouth at bedtime.  . cholecalciferol (VITAMIN D) 1000 UNITS tablet Take 1 tablet by mouth daily.  [name] docusate sodium (COLACE) 100 MG capsule Take by mouth.  . finasteride (PROSCAR) 5 MG tablet Take 1 tablet (5 mg total) by mouth daily.  . fluticasone (FLONASE) 50 MCG/ACT nasal spray USE 2 SPRAYS IN EACH       NOSTRIL DAILY  . glucose blood (ONE TOUCH ULTRA TEST) test strip Use as instructed  . hydrochlorothiazide (MICROZIDE) 12.5 MG capsule Take 1 capsule (12.5 mg total) by mouth daily.  [name] JANUVIA 100 MG tablet TAKE 1 TABLET DAILY (Patient taking differently: Take 50 mg by mouth daily. )  . losartan (COZAAR) 50 MG tablet Take 1 tablet (50 mg total) by mouth daily.  . meloxicam (MOBIC) 15 MG tablet TK 1 T PO QD  . metFORMIN (GLUCOPHAGE-XR) 500 MG 24 hr tablet TAKE 1 TABLET TWICE A DAY  . Misc Natural Products (OSTEO BI-FLEX TRIPLE STRENGTH) TABS Take 2 tablets by mouth daily.  . Multiple Vitamins-Minerals (CENTRUM SILVER) tablet Take 1 tablet by mouth daily.    . mupirocin ointment (BACTROBAN) 2 % APPLY TO AFFECTED AREA 3 TIMES A DAY  . Omega-3 Fatty Acids (FISH OIL) 1000 MG CAPS Take 1 capsule by mouth daily at 2 PM. Reported on 12/29/2014  . ONETOUCH DELICA LANCETS 97D  MISC 1 each by Does not apply route 2 (two) times daily.  . pantoprazole (PROTONIX) 40 MG tablet Take 1 tablet (40 mg total) by mouth daily.  . rosuvastatin (CRESTOR) 20 MG tablet Take 1 tablet (20 mg total) by mouth at bedtime.  . tadalafil (CIALIS) 20 MG tablet Take 20 mg by mouth daily.    . tamsulosin (FLOMAX) 0.4 MG CAPS capsule Take 1 capsule (0.4 mg total) by mouth daily at 2 PM.  . TOLAK 4 % CREA USE 1(ONE) APPLICATION(S) TOPICAL EVERY DAY    PHQ 2/9 Scores 04/21/2018 09/09/2017 08/25/2016 08/13/2015  PHQ - 2 Score 0 0 0 0  PHQ- 9 Score 0 0 - -    BP Readings from Last 3 Encounters:  04/21/18 128/70  12/22/17 110/70  09/09/17 124/60    Physical Exam Vitals signs and nursing note reviewed.  Constitutional:      General: He is not in acute distress.    Appearance: He is well-developed.  HENT:     Head: Normocephalic and atraumatic.  Right Ear: Tympanic membrane and ear canal normal.     Left Ear: Tympanic membrane and ear canal normal.  Eyes:     Pupils: Pupils are equal, round, and reactive to light.  Cardiovascular:     Rate and Rhythm: Normal rate and regular rhythm.  Pulmonary:     Effort: Pulmonary effort is normal. No respiratory distress.     Breath sounds: No wheezing or rhonchi.  Musculoskeletal: Normal range of motion.     Right lower leg: No edema.     Left lower leg: No edema.  Skin:    General: Skin is warm and dry.     Findings: No rash.  Neurological:     Mental Status: He is alert and oriented to person, place, and time.  Psychiatric:        Behavior: Behavior normal.        Thought Content: Thought content normal.     Wt Readings from Last 3 Encounters:  04/21/18 196 lb (88.9 kg)  12/22/17 205 lb (93 kg)  09/09/17 200 lb 9.6 oz (91 kg)    BP 128/70   Pulse 73   Ht 6\' 2"  (1.88 m)   Wt 196 lb (88.9 kg)   SpO2 99%   BMI 25.16 kg/m   Assessment and Plan: 1. Type 2 diabetes mellitus with diabetic nephropathy, without long-term current use  of insulin ([facility]) Continue current medications May be higher due to recent cortisone injections - Hemoglobin A1c  2. Essential (primary) hypertension controlled - Basic metabolic panel  3. Hyperlipidemia associated with type 2 diabetes mellitus ([place]) On statin therapy   Partially dictated using Editor, commissioning. Any errors are unintentional.  Halina Maidens, MD [facility]  04/21/2018

## 2018-04-22 LAB — BASIC METABOLIC PANEL
BUN/Creatinine Ratio: 21 (ref 10–24)
BUN: 26 mg/dL (ref 8–27)
CO2: 24 mmol/L (ref 20–29)
Calcium: 9.6 mg/dL (ref 8.6–10.2)
Chloride: 100 mmol/L (ref 96–106)
Creatinine, Ser: 1.22 mg/dL (ref 0.76–1.27)
GFR calc Af Amer: 64 mL/min/{1.73_m2} (ref >59)
GFR calc non Af Amer: 55 mL/min/{1.73_m2} — ABNORMAL LOW (ref >59)
Glucose: 180 mg/dL — ABNORMAL HIGH (ref 65–99)
Potassium: 4.8 mmol/L (ref 3.5–5.2)
Sodium: 143 mmol/L (ref 134–144)

## 2018-04-22 LAB — HEMOGLOBIN A1C
Est. average glucose Bld gHb Est-mCnc: 180 mg/dL
Hgb A1c MFr Bld: 7.9 % — ABNORMAL HIGH (ref 4.8–5.6)

## 2018-05-17 DIAGNOSIS — L578 Other skin changes due to chronic exposure to nonionizing radiation: Secondary | ICD-10-CM | POA: Diagnosis not present

## 2018-05-17 DIAGNOSIS — L570 Actinic keratosis: Secondary | ICD-10-CM | POA: Diagnosis not present

## 2018-05-17 DIAGNOSIS — Z872 Personal history of diseases of the skin and subcutaneous tissue: Secondary | ICD-10-CM | POA: Diagnosis not present

## 2018-05-17 DIAGNOSIS — B372 Candidiasis of skin and nail: Secondary | ICD-10-CM | POA: Diagnosis not present

## 2018-05-17 DIAGNOSIS — L821 Other seborrheic keratosis: Secondary | ICD-10-CM | POA: Diagnosis not present

## 2018-05-17 DIAGNOSIS — Z85828 Personal history of other malignant neoplasm of skin: Secondary | ICD-10-CM | POA: Diagnosis not present

## 2018-05-17 DIAGNOSIS — Z859 Personal history of malignant neoplasm, unspecified: Secondary | ICD-10-CM | POA: Diagnosis not present

## 2018-05-25 DIAGNOSIS — M1611 Unilateral primary osteoarthritis, right hip: Secondary | ICD-10-CM | POA: Diagnosis not present

## 2018-06-20 ENCOUNTER — Other Ambulatory Visit: Payer: Self-pay | Admitting: Internal Medicine

## 2018-06-20 DIAGNOSIS — R69 Illness, unspecified: Secondary | ICD-10-CM | POA: Diagnosis not present

## 2018-06-20 DIAGNOSIS — E1121 Type 2 diabetes mellitus with diabetic nephropathy: Secondary | ICD-10-CM

## 2018-06-20 DIAGNOSIS — N401 Enlarged prostate with lower urinary tract symptoms: Secondary | ICD-10-CM

## 2018-07-06 ENCOUNTER — Other Ambulatory Visit: Payer: Self-pay

## 2018-07-06 DIAGNOSIS — N401 Enlarged prostate with lower urinary tract symptoms: Secondary | ICD-10-CM

## 2018-07-06 MED ORDER — FINASTERIDE 5 MG PO TABS: 5.0000 mg | ORAL_TABLET | Freq: Every day | ORAL | 1 refills | Status: DC

## 2018-07-12 DIAGNOSIS — C4442 Squamous cell carcinoma of skin of scalp and neck: Secondary | ICD-10-CM | POA: Diagnosis not present

## 2018-07-28 ENCOUNTER — Other Ambulatory Visit: Payer: Self-pay

## 2018-07-28 ENCOUNTER — Encounter: Payer: Self-pay | Admitting: Internal Medicine

## 2018-07-28 ENCOUNTER — Ambulatory Visit (INDEPENDENT_AMBULATORY_CARE_PROVIDER_SITE_OTHER): Payer: Medicare HMO | Admitting: Internal Medicine

## 2018-07-28 VITALS — BP 132/70 | HR 68 | Ht 74.0 in | Wt 194.0 lb

## 2018-07-28 DIAGNOSIS — K4090 Unilateral inguinal hernia, without obstruction or gangrene, not specified as recurrent: Secondary | ICD-10-CM | POA: Diagnosis not present

## 2018-07-28 NOTE — Progress Notes (Signed)
Date:  07/28/2018   Name:  Maxwell Stanley   DOB:  15-Dec-1934   MRN:  [id number]   Chief Complaint: Groin Pain (2nd time. This past Saturday was sharp pains in right groin. 1 month ago he had the same problem at the same spot. Aleve helped ease this away.  This past time he felt shooting pains in right side of back as well. )  Groin Pain This is a new problem. The current episode started 1 to 4 weeks ago. The problem occurs intermittently. The pain is mild. Associated symptoms include abdominal pain (right groin pain). Pertinent negatives include no chest pain, chills, constipation, diarrhea, dysuria, headaches, nausea or shortness of breath. He has tried OTC analgesics for the symptoms. The treatment provided significant relief. His past medical history is significant for an inguinal hernia.    Review of Systems  Constitutional: Negative for chills.  Respiratory: Negative for chest tightness and shortness of breath.   Cardiovascular: Negative for chest pain, palpitations and leg swelling.  Gastrointestinal: Positive for abdominal pain (right groin pain). Negative for constipation, diarrhea and nausea.  Genitourinary: Negative for dysuria.  Neurological: Negative for dizziness and headaches.    Patient Active Problem List   Diagnosis Date Noted  . Onychomycosis of toenail 08/19/2017  . Allergic rhinitis 08/08/2016  . Carpal tunnel syndrome on left 12/12/2015  . Chronic maxillary sinusitis 04/09/2015  . Type 2 diabetes mellitus with diabetic nephropathy, without long-term current use of insulin ([place]) 11/28/2014  . LBP (low back pain) 09/21/2014  . Lumbar canal stenosis 09/21/2014  . Abnormal prostate specific antigen 04/17/2014  . Enlarged prostate with lower urinary tract symptoms (LUTS) 04/17/2014  . CKD stage 3 secondary to diabetes ([place]) 04/17/2014  . Hyperlipidemia associated with type 2 diabetes mellitus ([place]) 04/17/2014  . ED (erectile dysfunction) of organic origin  04/17/2014  . Essential (primary) hypertension 04/17/2014  . Esophagitis, reflux 04/17/2014  . Current use of proton pump inhibitor 04/17/2014  . Back muscle spasm 04/17/2014  . Degenerative arthritis of finger 04/17/2014  . Foot pain, left 04/17/2014  . Breast development in males 04/17/2014  . 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 Osteoarthritis of both knees 11/03/2013  . EDEMA 08/24/2009    Allergies  Allergen Reactions  . Amlodipine     Other reaction(s): Edema  . Codeine   . Penicillin G Rash  . Rocephin  [Ceftriaxone] Rash    Past Surgical History:  Procedure Laterality Date  . ANKLE ARTHROSCOPY WITH OPEN REDUCTION INTERNAL FIXATION (ORIF) Left 1998  . HERNIA REPAIR Left 4098   umbilical and left inguinal  . KNEE ARTHROSCOPY Right 2006  . skin cancer resection Right 04/07/2018   ON hand.  [name] THUMB ARTHROSCOPY Right 2013   tendon flap to joint    Social History   Tobacco Use  . Smoking status: Never Smoker  . Smokeless tobacco: Never Used  . Tobacco comment: smoking cessation materials not required  Substance Use Topics  . Alcohol use: No    Alcohol/week: 0.0 standard drinks  . Drug use: No     Medication list has been reviewed and updated.  Current Meds  Medication Sig  . acetaminophen (TYLENOL) 500 MG tablet Take 500 mg by mouth every 6 (six) hours as needed.  [name] aspirin 81 MG tablet Take 81 mg by mouth daily.    [name] atenolol (TENORMIN) 50 MG tablet Take 1 tablet (50 mg total) by mouth at bedtime.  . cholecalciferol (VITAMIN D) 1000 UNITS  tablet Take 1 tablet by mouth daily.  [name] docusate sodium (COLACE) 100 MG capsule Take by mouth.  . finasteride (PROSCAR) 5 MG tablet Take 1 tablet (5 mg total) by mouth daily.  . fluconazole (DIFLUCAN) 200 MG tablet Take 200 mg by mouth once a week.  . fluticasone (FLONASE) 50 MCG/ACT nasal spray USE 2 SPRAYS IN EACH       NOSTRIL DAILY  . glucose blood (ONE TOUCH ULTRA TEST) test strip Use as instructed  . hydrochlorothiazide  (MICROZIDE) 12.5 MG capsule TAKE 1 CAPSULE DAILY (IN   PLACE OF COMBINATION       TABLET)  . JANUVIA 100 MG tablet TAKE 1 TABLET DAILY (Patient taking differently: Take 50 mg by mouth daily. )  . losartan (COZAAR) 50 MG tablet TAKE 1 TABLET DAILY (IN    PLACE OF COMBINATION       TABLET)  . meloxicam (MOBIC) 15 MG tablet TK 1 T PO QD  . metFORMIN (GLUCOPHAGE-XR) 500 MG 24 hr tablet TAKE 1 TABLET TWICE A DAY  . Misc Natural Products (OSTEO BI-FLEX TRIPLE STRENGTH) TABS Take 2 tablets by mouth daily.  . Multiple Vitamins-Minerals (CENTRUM SILVER) tablet Take 1 tablet by mouth daily.    . mupirocin ointment (BACTROBAN) 2 % APPLY TO AFFECTED AREA 3 TIMES A DAY  . Omega-3 Fatty Acids (FISH OIL) 1000 MG CAPS Take 1 capsule by mouth daily at 2 PM. Reported on 12/29/2014  . ONETOUCH DELICA LANCETS 73S MISC 1 each by Does not apply route 2 (two) times daily.  . pantoprazole (PROTONIX) 40 MG tablet Take 1 tablet (40 mg total) by mouth daily.  . rosuvastatin (CRESTOR) 20 MG tablet Take 1 tablet (20 mg total) by mouth at bedtime.  . tadalafil (CIALIS) 20 MG tablet Take 20 mg by mouth daily.    . tamsulosin (FLOMAX) 0.4 MG CAPS capsule TAKE 1 CAPSULE DAILY AT 2PM  . TOLAK 4 % CREA USE 1(ONE) APPLICATION(S) TOPICAL EVERY DAY    PHQ 2/9 Scores 07/28/2018 04/21/2018 09/09/2017 08/25/2016  PHQ - 2 Score 0 0 0 0  PHQ- 9 Score 0 0 0 -    BP Readings from Last 3 Encounters:  07/28/18 132/70  04/21/18 128/70  12/22/17 110/70    Physical Exam Vitals signs and nursing note reviewed.  Constitutional:      General: He is not in acute distress.    Appearance: He is well-developed.  HENT:     Head: Normocephalic and atraumatic.  Pulmonary:     Effort: Pulmonary effort is normal. No respiratory distress.  Abdominal:     Hernia: A hernia is present. Hernia is present in the right inguinal area. There is no hernia in the left inguinal area.  Genitourinary:    Penis: Normal.      Scrotum/Testes: Normal.      Comments: Tender with mild fullness, partially reducible Musculoskeletal: Normal range of motion.  Lymphadenopathy:     Lower Body: No right inguinal adenopathy. No left inguinal adenopathy.  Skin:    General: Skin is warm and dry.     Findings: No rash.  Neurological:     Mental Status: He is alert and oriented to person, place, and time.  Psychiatric:        Behavior: Behavior normal.        Thought Content: Thought content normal.     Wt Readings from Last 3 Encounters:  07/28/18 194 lb (88 kg)  04/21/18 196 lb (88.9 kg)  12/22/17  205 lb (93 kg)    BP 132/70   Pulse 68   Ht 6\' 2"  (1.88 m)   Wt 194 lb (88 kg)   SpO2 97%   BMI 24.91 kg/m   Assessment and Plan: 1. Non-recurrent unilateral inguinal hernia without obstruction or gangrene Precautions given - Ambulatory referral to General Surgery   Partially dictated using Dragon software. Any errors are unintentional.  Halina Maidens, MD [facility]  07/28/2018

## 2018-08-05 DIAGNOSIS — I10 Essential (primary) hypertension: Secondary | ICD-10-CM | POA: Diagnosis not present

## 2018-08-05 DIAGNOSIS — Z6832 Body mass index (BMI) 32.0-32.9, adult: Secondary | ICD-10-CM | POA: Diagnosis not present

## 2018-08-05 DIAGNOSIS — L089 Local infection of the skin and subcutaneous tissue, unspecified: Secondary | ICD-10-CM | POA: Diagnosis not present

## 2018-08-05 DIAGNOSIS — E1151 Type 2 diabetes mellitus with diabetic peripheral angiopathy without gangrene: Secondary | ICD-10-CM | POA: Diagnosis not present

## 2018-08-09 ENCOUNTER — Telehealth: Payer: Self-pay

## 2018-08-09 NOTE — Telephone Encounter (Signed)
Please call patient to schedule an appointment for Dr. [name] when she comes back from vacation ( Tuesday or Wednesday ). He is having a surgical consultation tomorrow and his BP has been elevated.  Thank you.

## 2018-08-10 ENCOUNTER — Telehealth: Payer: Self-pay | Admitting: *Deleted

## 2018-08-10 ENCOUNTER — Encounter: Payer: Self-pay | Admitting: General Surgery

## 2018-08-10 ENCOUNTER — Encounter: Payer: Self-pay | Admitting: *Deleted

## 2018-08-10 ENCOUNTER — Other Ambulatory Visit: Payer: Self-pay

## 2018-08-10 ENCOUNTER — Ambulatory Visit: Payer: Medicare HMO | Admitting: General Surgery

## 2018-08-10 VITALS — BP 150/90 | HR 76 | Temp 97.2°F | Resp 14 | Ht 74.0 in | Wt 194.0 lb

## 2018-08-10 DIAGNOSIS — K4090 Unilateral inguinal hernia, without obstruction or gangrene, not specified as recurrent: Secondary | ICD-10-CM | POA: Diagnosis not present

## 2018-08-10 NOTE — Progress Notes (Signed)
Patient ID: Maxwell Stanley, male   DOB: 1934-08-17, 84 y.o.   MRN: [id number]  Chief Complaint  Patient presents with  . New Patient (Initial Visit)    new pt ref Dr.Berglund inguinal hernia     HPI Maxwell Stanley is a 84 y.o. male.   He was referred by his primary care doctor, Dr. Halina Maidens, for evaluation of a right inguinal hernia.  [name] states that he has had 2 episodes of right groin pain each lasting about an hour.  The pain did not radiate and was brought on by activity.  He saw his primary care doctor who diagnosed him with a right inguinal hernia.  He is currently not having any pain.  He denies any burning with urination.  He does have BPH and is on Flomax so he does have some difficulty starting his stream.  He denies any obstipation, nausea, or vomiting.  He denies constipation or having to strain with bowel movements.  He says that the "knot" in his groin that was there when he saw Dr. [name] is no longer present.  He does work on [employer] and engages in a fair amount of lifting, but now has a lift table that he uses more regularly.   Past Medical History:  Diagnosis Date  . BPH (benign prostatic hypertrophy)    with BOO  . Diabetes mellitus without complication ([place]) 2878  . Hypertension   . Nocturia   . Organic impotence   . Urinary frequency     Past Surgical History:  Procedure Laterality Date  . ANKLE ARTHROSCOPY WITH OPEN REDUCTION INTERNAL FIXATION (ORIF) Left 1998  . HEMORRHOID SURGERY    . HERNIA REPAIR Left 6767   umbilical and left inguinal  . KNEE ARTHROSCOPY Right 2006  . skin cancer resection Right 04/07/2018   ON hand.  [name] THUMB ARTHROSCOPY Right 2013   tendon flap to joint    Family History  Problem Relation Age of Onset  . Diabetes Mother   . Coronary artery disease Father   . Hypertension Father   . Hyperlipidemia Father   . Leukemia Father     Social History Social History   Tobacco Use  . Smoking status: Never  Smoker  . Smokeless tobacco: Never Used  . Tobacco comment: smoking cessation materials not required  Substance Use Topics  . Alcohol use: No    Alcohol/week: 0.0 standard drinks  . Drug use: No    Allergies  Allergen Reactions  . Amlodipine     Other reaction(s): Edema  . Codeine   . Penicillin G Rash  . Rocephin  [Ceftriaxone] Rash    Current Outpatient Medications  Medication Sig Dispense Refill  . acetaminophen (TYLENOL) 500 MG tablet Take 500 mg by mouth every 6 (six) hours as needed.    [name] acidophilus (RISAQUAD) CAPS capsule Take by mouth daily.    [name] aspirin 81 MG tablet Take 81 mg by mouth daily.      [name] atenolol (TENORMIN) 50 MG tablet Take 1 tablet (50 mg total) by mouth at bedtime. 90 tablet 3  . cholecalciferol (VITAMIN D) 1000 UNITS tablet Take 1 tablet by mouth daily.    [name] docusate sodium (COLACE) 100 MG capsule Take by mouth.    . fluticasone (FLONASE) 50 MCG/ACT nasal spray USE 2 SPRAYS IN EACH       NOSTRIL DAILY 48 g 3  . glucose blood (ONE TOUCH ULTRA TEST) test strip Use as instructed 100 each  12  . hydrochlorothiazide (MICROZIDE) 12.5 MG capsule TAKE 1 CAPSULE DAILY (IN   PLACE OF COMBINATION       TABLET) 90 capsule 3  . JANUVIA 100 MG tablet TAKE 1 TABLET DAILY (Patient taking differently: Take 100 mg by mouth daily. ) 90 tablet 3  . losartan (COZAAR) 50 MG tablet TAKE 1 TABLET DAILY (IN    PLACE OF COMBINATION       TABLET) 90 tablet 3  . meloxicam (MOBIC) 15 MG tablet TK 1 T PO QD  3  . metFORMIN (GLUCOPHAGE-XR) 500 MG 24 hr tablet TAKE 1 TABLET TWICE A DAY 180 tablet 3  . Misc Natural Products (OSTEO BI-FLEX TRIPLE STRENGTH) TABS Take 2 tablets by mouth daily.    . Multiple Vitamins-Minerals (CENTRUM SILVER) tablet Take 1 tablet by mouth daily.      . Omega-3 Fatty Acids (FISH OIL) 1000 MG CAPS Take 1 capsule by mouth daily at 2 PM. Reported on 12/29/2014    . ONETOUCH DELICA LANCETS 42V MISC 1 each by Does not apply route 2 (two) times daily. 100 each 12   . pantoprazole (PROTONIX) 40 MG tablet Take 1 tablet (40 mg total) by mouth daily. 90 tablet 3  . rosuvastatin (CRESTOR) 20 MG tablet Take 1 tablet (20 mg total) by mouth at bedtime. 90 tablet 3  . sulfamethoxazole-trimethoprim (BACTRIM DS) 800-160 MG tablet Take 1 tablet by mouth 2 (two) times daily.    . tamsulosin (FLOMAX) 0.4 MG CAPS capsule TAKE 1 CAPSULE DAILY AT 2PM 90 capsule 3   No current facility-administered medications for this visit.     Review of Systems Review of Systems  Musculoskeletal: Positive for arthralgias and joint swelling.       Right knee  All other systems reviewed and are negative.   Blood pressure (!) 150/90, pulse 76, temperature (!) 97.2 F (36.2 C), temperature source Temporal, resp. rate 14, height 6\' 2"  (1.88 m), weight 194 lb (88 kg), SpO2 98 %.  Physical Exam Physical Exam Exam conducted with a chaperone present.  Constitutional:      General: He is not in acute distress.    Appearance: Normal appearance. He is normal weight.  HENT:     Head: Normocephalic and atraumatic.     Nose:     Comments: Covered with a mask secondary to COVID-19 precautions    Mouth/Throat:     Comments: Covered with a mask secondary to COVID-19 precautions Eyes:     General: No scleral icterus.       Right eye: No discharge.        Left eye: No discharge.  Neck:     Musculoskeletal: Normal range of motion.     Comments: No thyromegaly or thyroid masses. Cardiovascular:     Rate and Rhythm: Normal rate and regular rhythm.     Pulses: Normal pulses.     Heart sounds: Normal heart sounds.  Pulmonary:     Effort: Pulmonary effort is normal.     Breath sounds: Normal breath sounds.  Abdominal:     General: Abdomen is flat. Bowel sounds are normal.     Palpations: Abdomen is soft.     Hernia: A hernia is present. Hernia is present in the right inguinal area.  Genitourinary:      Comments: There is a soft bulge in the right inguinal canal.  It enlarges with  Valsalva.  It spontaneously reduces. Lymphadenopathy:     Cervical: No cervical adenopathy.  Neurological:     Mental Status: He is alert.     Data Reviewed There is no relevant imaging or laboratory data to review.  I did review Dr. Gaspar Cola note in electronic medical record dated July 28, 2018 wherein she diagnosed him with a hernia.  According to her note, he did have some radiation to his back with the second episode of pain.  Assessment This is an 84 year old man who has a prior history of left inguinal hernia repair now presenting with symptoms and physical exam findings consistent with a right inguinal hernia.  Due to his active lifestyle, he would like to have this repaired.  Plan I have explained the procedure, risks, and aftercare of inguinal hernia repair to [name].   Risks include but are not limited to bleeding, infection, wound problems, anesthesia, recurrence, bladder or intestine injury, urinary retention, testicular dysfunction, chronic pain, mesh problems.  He  seems to understand and agrees to proceed.  Questions were answered to his stated satisfaction.  We will schedule him for surgery.    Fredirick Maudlin 08/10/2018, 10:34 AM

## 2018-08-10 NOTE — Patient Instructions (Addendum)
The patient is aware to call back for any questions or new concerns.  Schedule surgery  Inguinal Hernia, Adult An inguinal hernia is when fat or your intestines push through a weak spot in a muscle where your leg meets your lower belly (groin). This causes a rounded lump (bulge). This kind of hernia could also be:  In your scrotum, if you are male.  In folds of skin around your vagina, if you are male. There are three types of inguinal hernias. These include:  Hernias that can be pushed back into the belly (are reducible). This type rarely causes pain.  Hernias that cannot be pushed back into the belly (are incarcerated).  Hernias that cannot be pushed back into the belly and lose their blood supply (are strangulated). This type needs emergency surgery. If you do not have symptoms, you may not need treatment. If you have symptoms or a large hernia, you may need surgery. Follow these instructions at home: Lifestyle  Do these things if told by your doctor so you do not have trouble pooping (constipation): ? Drink enough fluid to keep your pee (urine) pale yellow. ? Eat foods that have a lot of fiber. These include fresh fruits and vegetables, whole grains, and beans. ? Limit foods that are high in fat and processed sugars. These include foods that are fried or sweet. ? Take medicine for trouble pooping.  Avoid lifting heavy objects.  Avoid standing for long amounts of time.  Do not use any products that contain nicotine or tobacco. These include cigarettes and e-cigarettes. If you need help quitting, ask your doctor.  Stay at a healthy weight. General instructions  You may try to push your hernia in by very gently pressing on it when you are lying down. Do not try to force the bulge back in if it will not push in easily.  Watch your hernia for any changes in shape, size, or color. Tell your doctor if you see any changes.  Take over-the-counter and prescription medicines only  as told by your doctor.  Keep all follow-up visits as told by your doctor. This is important. Contact a doctor if:  You have a fever.  You have new symptoms.  Your symptoms get worse. Get help right away if:  The area where your leg meets your lower belly has: ? Pain that gets worse suddenly. ? A bulge that gets bigger suddenly, and it does not get smaller after that. ? A bulge that turns red or purple. ? A bulge that is painful when you touch it.  You are a man, and your scrotum: ? Suddenly feels painful. ? Suddenly changes in size.  You cannot push the hernia in by very gently pressing on it when you are lying down. Do not try to force the bulge back in if it will not push in easily.  You feel sick to your stomach (nauseous), and that feeling does not go away.  You throw up (vomit), and that keeps happening.  You have a fast heartbeat.  You cannot poop (have a bowel movement) or pass gas. These symptoms may be an emergency. Do not wait to see if the symptoms will go away. Get medical help right away. Call your local emergency services (911 in the [place] Summary  An inguinal hernia is when fat or your intestines push through a weak spot in a muscle where your leg meets your lower belly (groin). This causes a rounded lump (bulge).  If you do  not have symptoms, you may not need treatment. If you have symptoms or a large hernia, you may need surgery.  Avoid lifting heavy objects. Also avoid standing for long amounts of time.  Do not try to force the bulge back in if it will not push in easily. This information is not intended to replace advice given to you by your health care provider. Make sure you discuss any questions you have with your health care provider. Document Released: 01/23/2006 Document Revised: 01/24/2017 Document Reviewed: 09/24/2016 Elsevier Patient Education  2020 [employer].

## 2018-08-10 NOTE — Progress Notes (Signed)
Patient's surgery to be scheduled for 10-01-18 at [facility] with Dr. Celine Ahr.   The patient is aware to have COVID-19 testing done on 09-28-18 at the [place] building drive thru ([address] [place]) after [employer]. He is aware to isolate after, have no visitors, wash hands frequently, and avoid touching face.   The patient is aware he will need to Pre-Admit. Patient will check in at the [facility] entrance due to COVID-19 restrictions and will then be escorted to the [place], [address] (first floor). Patient will be contacted once Pre-admission appointment has been arranged with date and time.   Patient aware to be NPO after midnight and have a driver.   He is aware to check in at the [place] entrance where he/she will be screened for the coronavirus and then sent to [facility] Day Surgery.   Patient aware that he may have one visitor due to COVID-19 restrictions.   The patient verbalizes understanding of the above.   The patient is aware to call the office should he have further questions.

## 2018-08-10 NOTE — Addendum Note (Signed)
Addended by: Fredirick Maudlin on: 08/10/2018 10:40 AM   Modules accepted: Orders, SmartSet

## 2018-08-10 NOTE — Telephone Encounter (Signed)
Patient contacted and notified that surgery has been scheduled for 10-01-18 as previously discussed.   The patient is aware to Pre-admit and have COVID testing done on 09-28-18 at 10 am.   Patient also reminded to keep pre-op appointment on 09-14-18 at 9 am with Dr. Celine Ahr.   He verbalizes understanding of the above.

## 2018-08-15 DIAGNOSIS — R69 Illness, unspecified: Secondary | ICD-10-CM | POA: Diagnosis not present

## 2018-08-16 ENCOUNTER — Ambulatory Visit
Admission: EM | Admit: 2018-08-16 | Discharge: 2018-08-16 | Disposition: A | Discharge status: Home / Self Care | Payer: Medicare HMO | Attending: Family Medicine | Admitting: Family Medicine

## 2018-08-16 ENCOUNTER — Other Ambulatory Visit: Payer: Self-pay

## 2018-08-16 DIAGNOSIS — I10 Essential (primary) hypertension: Secondary | ICD-10-CM

## 2018-08-16 NOTE — ED Provider Notes (Addendum)
[facility]    CSN: [id number] Arrival date & time: 08/16/18  0802     History   Chief Complaint Chief Complaint  Patient presents with  . Hypertension    HPI Maxwell Stanley is a 84 y.o. male.   84 yo male with a h/o hypertension here with c/o elevated blood pressure readings for the past week but worse this morning. Denies any chest pains, shortness of breath, numbness/tingling, weakness.    Hypertension    Past Medical History:  Diagnosis Date  . BPH (benign prostatic hypertrophy)    with BOO  . Diabetes mellitus without complication ([place]) 7893  . Hypertension   . Nocturia   . Organic impotence   . Urinary frequency     Patient Active Problem List   Diagnosis Date Noted  . Onychomycosis of toenail 08/19/2017  . Allergic rhinitis 08/08/2016  . Carpal tunnel syndrome on left 12/12/2015  . Chronic maxillary sinusitis 04/09/2015  . Type 2 diabetes mellitus with diabetic nephropathy, without long-term current use of insulin ([place]) 11/28/2014  . LBP (low back pain) 09/21/2014  . Lumbar canal stenosis 09/21/2014  . Abnormal prostate specific antigen 04/17/2014  . Enlarged prostate with lower urinary tract symptoms (LUTS) 04/17/2014  . CKD stage 3 secondary to diabetes ([place]) 04/17/2014  . Hyperlipidemia associated with type 2 diabetes mellitus ([place]) 04/17/2014  . 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 Essential (primary) hypertension 04/17/2014  . Esophagitis, reflux 04/17/2014  . Current use of proton pump inhibitor 04/17/2014  . Back muscle spasm 04/17/2014  . Degenerative arthritis of finger 04/17/2014  . Foot pain, left 04/17/2014  . Breast development in males 04/17/2014  . 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 Osteoarthritis of both knees 11/03/2013  . EDEMA 08/24/2009    Past Surgical History:  Procedure Laterality Date  . ANKLE ARTHROSCOPY WITH OPEN REDUCTION INTERNAL FIXATION (ORIF) Left 1998  . HEMORRHOID SURGERY    . HERNIA  REPAIR Left 8101   umbilical and left inguinal  . KNEE ARTHROSCOPY Right 2006  . skin cancer resection Right 04/07/2018   ON hand.  [name] THUMB ARTHROSCOPY Right 2013   tendon flap to joint       Home Medications    Prior to Admission medications   Medication Sig Start Date End Date Taking? Authorizing Provider  acetaminophen (TYLENOL) 500 MG tablet Take 500 mg by mouth every 6 (six) hours as needed.   Yes [provider]  acidophilus (RISAQUAD) CAPS capsule Take by mouth daily.   Yes [provider]  aspirin 81 MG tablet Take 81 mg by mouth daily.     Yes [provider]  atenolol (TENORMIN) 50 MG tablet Take 1 tablet (50 mg total) by mouth at bedtime. 12/22/17  Yes Glean Hess, MD  cholecalciferol (VITAMIN D) 1000 UNITS tablet Take 1 tablet by mouth daily.   Yes [provider]  docusate sodium (COLACE) 100 MG capsule Take by mouth.   Yes [provider]  fluticasone (FLONASE) 50 MCG/ACT nasal spray USE 2 SPRAYS IN EACH       NOSTRIL DAILY 03/30/18  Yes Glean Hess, MD  glucose blood (ONE TOUCH ULTRA TEST) test strip Use as instructed 08/19/17  Yes Glean Hess, MD  hydrochlorothiazide (MICROZIDE) 12.5 MG capsule TAKE 1 CAPSULE DAILY (IN   PLACE OF COMBINATION       TABLET) 06/21/18  Yes Glean Hess, MD  JANUVIA 100 MG tablet TAKE 1 TABLET  DAILY Patient taking differently: Take 100 mg by mouth daily.  04/09/18  Yes Glean Hess, MD  losartan (COZAAR) 50 MG tablet TAKE 1 TABLET DAILY (IN    PLACE OF COMBINATION       TABLET) 06/21/18  Yes Glean Hess, MD  meloxicam (MOBIC) 15 MG tablet TK 1 T PO QD 11/13/16  Yes [provider]  metFORMIN (GLUCOPHAGE-XR) 500 MG 24 hr tablet TAKE 1 TABLET TWICE A DAY 06/21/18  Yes Glean Hess, MD  Misc Natural Products (OSTEO BI-FLEX TRIPLE STRENGTH) TABS Take 2 tablets by mouth daily.   Yes [provider]  Multiple Vitamins-Minerals (CENTRUM SILVER) tablet Take  1 tablet by mouth daily.     Yes [provider]  Omega-3 Fatty Acids (FISH OIL) 1000 MG CAPS Take 1 capsule by mouth daily at 2 PM. Reported on 12/29/2014   Yes [provider]  ONETOUCH DELICA LANCETS 25Z MISC 1 each by Does not apply route 2 (two) times daily. 08/19/17  Yes Glean Hess, MD  pantoprazole (PROTONIX) 40 MG tablet Take 1 tablet (40 mg total) by mouth daily. 12/22/17  Yes Glean Hess, MD  rosuvastatin (CRESTOR) 20 MG tablet Take 1 tablet (20 mg total) by mouth at bedtime. 12/22/17  Yes Glean Hess, MD  tamsulosin (FLOMAX) 0.4 MG CAPS capsule TAKE 1 CAPSULE DAILY AT 2PM 06/21/18  Yes Glean Hess, MD  sulfamethoxazole-trimethoprim (BACTRIM DS) 800-160 MG tablet Take 1 tablet by mouth 2 (two) times daily.    [provider]    Family History Family History  Problem Relation Age of Onset  . Diabetes Mother   . Coronary artery disease Father   . Hypertension Father   . Hyperlipidemia Father   . Leukemia Father     Social History Social History   Tobacco Use  . Smoking status: Never Smoker  . Smokeless tobacco: Never Used  . Tobacco comment: smoking cessation materials not required  Substance Use Topics  . Alcohol use: No    Alcohol/week: 0.0 standard drinks  . Drug use: No     Allergies   Amlodipine, Codeine, Penicillin g, and Rocephin  [ceftriaxone]   Review of Systems Review of Systems   Physical Exam Triage Vital Signs ED Triage Vitals  Enc Vitals Group     BP 08/16/18 0816 (!) 167/102     Pulse Rate 08/16/18 0816 74     Resp 08/16/18 0816 17     Temp 08/16/18 0816 98.2 F (36.8 C)     Temp Source 08/16/18 0816 Oral     SpO2 08/16/18 0816 99 %     Weight 08/16/18 0812 195 lb (88.5 kg)     Height 08/16/18 0812 6\' 2"  (1.88 m)     Head Circumference --      Peak Flow --      Pain Score 08/16/18 0812 0     Pain Loc --      Pain Edu? --      Excl. in [place]? --    No data found.  Updated Vital Signs  BP (!) 167/102 (BP Location: Left Arm)   Pulse 74   Temp 98.2 F (36.8 C) (Oral)   Resp 17   Ht 6\' 2"  (1.88 m)   Wt 88.5 kg   SpO2 99%   BMI 25.04 kg/m   Visual Acuity Right Eye Distance:   Left Eye Distance:   Bilateral Distance:    Right Eye Near:  Left Eye Near:    Bilateral Near:     Physical Exam Vitals signs and nursing note reviewed.  Constitutional:      General: He is not in acute distress.    Appearance: He is not toxic-appearing or diaphoretic.  Cardiovascular:     Rate and Rhythm: Normal rate and regular rhythm.     Pulses: Normal pulses.     Heart sounds: Normal heart sounds.  Pulmonary:     Effort: Pulmonary effort is normal. No respiratory distress.     Breath sounds: Normal breath sounds. No stridor. No wheezing, rhonchi or rales.  Musculoskeletal:     Right lower leg: No edema.     Left lower leg: No edema.  Neurological:     General: No focal deficit present.     Mental Status: He is alert.     Cranial Nerves: No cranial nerve deficit.      UC Treatments / Results  Labs (all labs ordered are listed, but only abnormal results are displayed) Labs Reviewed - No data to display  EKG   Radiology No results found.  Procedures Procedures (including critical care time)  Medications Ordered in UC Medications - No data to display  Initial Impression / Assessment and Plan / UC Course  I have reviewed the triage vital signs and the nursing notes.  Pertinent labs & imaging results that were available during my care of the patient were reviewed by me and considered in my medical decision making (see chart for details).      Final Clinical Impressions(s) / UC Diagnoses   Final diagnoses:  Essential hypertension     Discharge Instructions     Increase losartan to 100mg  per day (take 2 of the 50mg  tablets) Follow up with primary care provider as scheduled in 10 days    ED Prescriptions    None     1.diagnosis reviewed with  patient 3. Recommend increase losartan as above and follow up with PCP  3. Follow-up prn   Controlled Substance Prescriptions [place] Controlled Substance Registry consulted? Not Applicable   Norval Gable, MD 08/16/18 [place], MD 08/16/18 1231

## 2018-08-16 NOTE — ED Triage Notes (Signed)
Patient states that his blood pressure has been 220/110 this morning. States that he takes Losartan and atenolol and his primary MD is Dr. [name]. Patient states that he has noticed an increase in blood pressure over the last week.

## 2018-08-16 NOTE — Discharge Instructions (Signed)
Increase losartan to 100mg  per day (take 2 of the 50mg  tablets) Follow up with primary care provider as scheduled in 10 days

## 2018-08-26 ENCOUNTER — Other Ambulatory Visit: Payer: Self-pay

## 2018-08-26 ENCOUNTER — Encounter: Payer: Self-pay | Admitting: Internal Medicine

## 2018-08-26 ENCOUNTER — Ambulatory Visit (INDEPENDENT_AMBULATORY_CARE_PROVIDER_SITE_OTHER): Payer: Medicare HMO | Admitting: Internal Medicine

## 2018-08-26 VITALS — BP 148/94 | HR 94 | Ht 74.0 in | Wt 191.0 lb

## 2018-08-26 DIAGNOSIS — M79671 Pain in right foot: Secondary | ICD-10-CM

## 2018-08-26 DIAGNOSIS — E118 Type 2 diabetes mellitus with unspecified complications: Secondary | ICD-10-CM | POA: Diagnosis not present

## 2018-08-26 DIAGNOSIS — I10 Essential (primary) hypertension: Secondary | ICD-10-CM

## 2018-08-26 MED ORDER — LOSARTAN POTASSIUM 100 MG PO TABS: 100.0000 mg | ORAL_TABLET | Freq: Every day | ORAL | 3 refills | Status: DC

## 2018-08-26 MED ORDER — AMLODIPINE BESYLATE 2.5 MG PO TABS: 2.5000 mg | ORAL_TABLET | Freq: Every day | ORAL | 3 refills | Status: DC

## 2018-08-26 MED ORDER — AMLODIPINE BESYLATE 2.5 MG PO TABS: 2.5000 mg | ORAL_TABLET | Freq: Every day | ORAL | 0 refills | Status: DC

## 2018-08-26 NOTE — Progress Notes (Addendum)
Date:  08/26/2018   Name:  Maxwell Stanley   DOB:  [birth year]   MRN:  [id number]   Chief Complaint: Diabetes (4 month follow up.), Hypertension, and Foot Pain (In July was having some foot cramps and pain. After 3 days his foot was swollen. Was told at ER out of town he had infection. Toe is still purple and swelling on and off. Was told bc blood pressure was high to take a extra 1/2 losartan at night if greater than 150/90)  Diabetes He presents for his follow-up diabetic visit. He has type 2 diabetes mellitus. His disease course has been fluctuating. Pertinent negatives for hypoglycemia include no headaches or nervousness/anxiousness. There are no diabetic associated symptoms. Pertinent negatives for diabetes include no chest pain and no fatigue. Current diabetic treatment includes oral agent (dual therapy) (metformin and januvia). His weight is stable. He is following a generally healthy diet. He monitors blood glucose at home 1-2 x per day. There is no change in his home blood glucose trend. His breakfast blood glucose is taken between 7-8 am. His breakfast blood glucose range is generally 140-180 mg/dl. An ACE inhibitor/angiotensin II receptor blocker is being taken.  Hypertension This is a chronic problem. The problem has been gradually worsening since onset. The problem is resistant. Pertinent negatives include no chest pain, headaches, orthopnea, palpitations, peripheral edema or shortness of breath. There are no associated agents to hypertension. Past treatments include beta blockers, angiotensin blockers and diuretics.  Toe infection - he had a small cut on his right 4th toe and the had a sharp pain and then bruising on the sole of his right foot.  He went to UC in [place], [place].  His BP was high and his glucose was in the high 100's.  He was treated with Bactrim for possible infection, his losartan was increased to 50 mg bid and he was given an additional metformin 500 mg IR at lunch for 5  days.    Lab Results  Component Value Date   HGBA1C 7.9 (H) 04/21/2018   Lab Results  Component Value Date   WBC 7.3 12/22/2017   HGB 15.9 12/22/2017   HCT 46.2 12/22/2017   MCV 98 (H) 12/22/2017   PLT 209 12/22/2017   Lab Results  Component Value Date   CREATININE 1.22 04/21/2018   BUN 26 04/21/2018   NA 143 04/21/2018   K 4.8 04/21/2018   CL 100 04/21/2018   CO2 24 04/21/2018     Review of Systems  Constitutional: Negative for chills, fatigue, fever and unexpected weight change.  Respiratory: Negative for chest tightness and shortness of breath.   Cardiovascular: Negative for chest pain, palpitations, orthopnea and leg swelling.  Gastrointestinal: Negative for abdominal pain.  Skin: Positive for wound (wound healed).  Neurological: Negative for headaches.  Psychiatric/Behavioral: Negative for dysphoric mood and sleep disturbance. The patient is not nervous/anxious.     Patient Active Problem List   Diagnosis Date Noted  . Onychomycosis of toenail 08/19/2017  . Allergic rhinitis 08/08/2016  . Carpal tunnel syndrome on left 12/12/2015  . Chronic maxillary sinusitis 04/09/2015  . Type 2 diabetes mellitus with diabetic nephropathy, without long-term current use of insulin ([place]) 11/28/2014  . LBP (low back pain) 09/21/2014  . Lumbar canal stenosis 09/21/2014  . Abnormal prostate specific antigen 04/17/2014  . Enlarged prostate with lower urinary tract symptoms (LUTS) 04/17/2014  . CKD stage 3 secondary to diabetes ([place]) 04/17/2014  . Hyperlipidemia associated with type  2 diabetes mellitus ([place]) 04/17/2014  . 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 Essential (primary) hypertension 04/17/2014  . Esophagitis, reflux 04/17/2014  . Current use of proton pump inhibitor 04/17/2014  . Back muscle spasm 04/17/2014  . Degenerative arthritis of finger 04/17/2014  . Foot pain, left 04/17/2014  . Breast development in males 04/17/2014  . Leg varices  04/17/2014  . Osteoarthritis of both knees 11/03/2013  . EDEMA 08/24/2009    Allergies  Allergen Reactions  . Amlodipine     Other reaction(s): Edema  . Codeine   . Penicillin G Rash  . Rocephin  [Ceftriaxone] Rash    Past Surgical History:  Procedure Laterality Date  . ANKLE ARTHROSCOPY WITH OPEN REDUCTION INTERNAL FIXATION (ORIF) Left 1998  . HEMORRHOID SURGERY    . HERNIA REPAIR Left 6712   umbilical and left inguinal  . KNEE ARTHROSCOPY Right 2006  . skin cancer resection Right 04/07/2018   ON hand.  [name] THUMB ARTHROSCOPY Right 2013   tendon flap to joint    Social History   Tobacco Use  . Smoking status: Never Smoker  . Smokeless tobacco: Never Used  . Tobacco comment: smoking cessation materials not required  Substance Use Topics  . Alcohol use: No    Alcohol/week: 0.0 standard drinks  . Drug use: No     Medication list has been reviewed and updated.  Current Meds  Medication Sig  . acetaminophen (TYLENOL) 500 MG tablet Take 500 mg by mouth every 6 (six) hours as needed.  [name] aspirin 81 MG tablet Take 81 mg by mouth daily.    [name] atenolol (TENORMIN) 50 MG tablet Take 1 tablet (50 mg total) by mouth at bedtime.  . cholecalciferol (VITAMIN D) 1000 UNITS tablet Take 1 tablet by mouth daily.  [name] docusate sodium (COLACE) 100 MG capsule Take by mouth.  . fluticasone (FLONASE) 50 MCG/ACT nasal spray USE 2 SPRAYS IN EACH       NOSTRIL DAILY  . glucose blood (ONE TOUCH ULTRA TEST) test strip Use as instructed  . hydrochlorothiazide (MICROZIDE) 12.5 MG capsule TAKE 1 CAPSULE DAILY (IN   PLACE OF COMBINATION       TABLET)  . JANUVIA 100 MG tablet TAKE 1 TABLET DAILY (Patient taking differently: Take 100 mg by mouth daily. )  . losartan (COZAAR) 50 MG tablet TAKE 1 TABLET DAILY (IN    PLACE OF COMBINATION       TABLET) (Patient taking differently: Take 50 mg by mouth 2 (two) times daily. )  . meloxicam (MOBIC) 15 MG tablet TK 1 T PO QD  . metFORMIN (GLUCOPHAGE-XR) 500 MG 24  hr tablet TAKE 1 TABLET TWICE A DAY  . Misc Natural Products (OSTEO BI-FLEX TRIPLE STRENGTH) TABS Take 2 tablets by mouth daily.  . Multiple Vitamins-Minerals (CENTRUM SILVER) tablet Take 1 tablet by mouth daily.    [name] DELICA LANCETS 45Y MISC 1 each by Does not apply route 2 (two) times daily.  . pantoprazole (PROTONIX) 40 MG tablet Take 1 tablet (40 mg total) by mouth daily.  . rosuvastatin (CRESTOR) 20 MG tablet Take 1 tablet (20 mg total) by mouth at bedtime.  . tamsulosin (FLOMAX) 0.4 MG CAPS capsule TAKE 1 CAPSULE DAILY AT [facility]    [facility] 2/9 Scores 08/26/2018 07/28/2018 04/21/2018 09/09/2017  PHQ - 2 Score 0 0 0 0  PHQ- 9 Score 0 0 0 0    BP Readings from Last 3 Encounters:  08/26/18 (!) 148/94  08/16/18 (!) 167/102  08/10/18 (!) 150/90    Physical Exam Vitals signs and nursing note reviewed.  Constitutional:      General: He is not in acute distress.    Appearance: He is well-developed.  HENT:     Head: Normocephalic and atraumatic.  Neck:     Musculoskeletal: Normal range of motion and neck supple.  Cardiovascular:     Rate and Rhythm: Normal rate and regular rhythm.     Heart sounds: No murmur.  Pulmonary:     Effort: Pulmonary effort is normal. No respiratory distress.  Musculoskeletal: Normal range of motion.     Right lower leg: Edema (trace) present.     Left lower leg: No edema.  Lymphadenopathy:     Cervical: No cervical adenopathy.  Skin:    General: Skin is warm and dry.     Capillary Refill: Capillary refill takes less than 2 seconds.     Findings: Bruising present. No rash.     Comments: 1 mm eschar on right 4th toe - no surrounding erythema,induration, drainage or tenderness Sole of right foot normal without brusing  Neurological:     Mental Status: He is alert and oriented to person, place, and time.  Psychiatric:        Behavior: Behavior normal.        Thought Content: Thought content normal.     Wt Readings from Last 3 Encounters:  08/26/18  191 lb (86.6 kg)  08/16/18 195 lb (88.5 kg)  08/10/18 194 lb (88 kg)    BP (!) 148/94   Pulse 94   Ht 6\' 2"  (1.88 m)   Wt 191 lb (86.6 kg)   SpO2 96%   BMI 24.52 kg/m   Assessment and Plan: 1. Type II diabetes mellitus with complication (HCC) Of HTN and hyperlipidemia BS are slightly higher than previously but last A1C was acceptable Will continue same medications and recheck labs Suspect that mild increase was due to mild skin infection that has now resolved Continue Januvia 100 mg and metformin XR 500 mg bid. - Basic metabolic panel - Hemoglobin A1c  2. Essential (primary) hypertension BP is not a goal on triple therapy Will try adding low dose amlodipine to current medications; monitor for edema/constipation BP recheck at pre-op next month - if not controlled he will follow up with me soon after. - losartan (COZAAR) 100 MG tablet; Take 1 tablet (100 mg total) by mouth daily.  Dispense: 90 tablet; Refill: 3 - amLODipine (NORVASC) 2.5 MG tablet; Take 1 tablet (2.5 mg total) by mouth daily.  Dispense: 30 tablet; Refill: 0  3. Right foot pain Minor wound healed Suspect bruising was due to disruption of a superficial vein on the sole of the foot.   Partially dictated using Editor, commissioning. Any errors are unintentional.  Halina Maidens, MD [facility]  08/26/2018

## 2018-08-27 LAB — BASIC METABOLIC PANEL
BUN/Creatinine Ratio: 24 (ref 10–24)
BUN: 29 mg/dL — ABNORMAL HIGH (ref 8–27)
CO2: 22 mmol/L (ref 20–29)
Calcium: 9.8 mg/dL (ref 8.6–10.2)
Chloride: 102 mmol/L (ref 96–106)
Creatinine, Ser: 1.23 mg/dL (ref 0.76–1.27)
GFR calc Af Amer: 63 mL/min/{1.73_m2} (ref >59)
GFR calc non Af Amer: 55 mL/min/{1.73_m2} — ABNORMAL LOW (ref >59)
Glucose: 168 mg/dL — ABNORMAL HIGH (ref 65–99)
Potassium: 4.7 mmol/L (ref 3.5–5.2)
Sodium: 140 mmol/L (ref 134–144)

## 2018-08-27 LAB — HEMOGLOBIN A1C
Est. average glucose Bld gHb Est-mCnc: 166 mg/dL
Hgb A1c MFr Bld: 7.4 % — ABNORMAL HIGH (ref 4.8–5.6)

## 2018-09-14 ENCOUNTER — Encounter: Payer: Self-pay | Admitting: General Surgery

## 2018-09-14 ENCOUNTER — Ambulatory Visit: Payer: Medicare HMO | Admitting: General Surgery

## 2018-09-14 ENCOUNTER — Other Ambulatory Visit: Payer: Self-pay

## 2018-09-14 VITALS — BP 173/92 | HR 76 | Temp 97.5°F | Resp 16 | Ht 74.0 in | Wt 193.0 lb

## 2018-09-14 DIAGNOSIS — K4090 Unilateral inguinal hernia, without obstruction or gangrene, not specified as recurrent: Secondary | ICD-10-CM

## 2018-09-14 NOTE — Patient Instructions (Signed)
Your surgery is scheduled 10/01/2018.    Inguinal Hernia, Adult An inguinal hernia is when fat or your intestines push through a weak spot in a muscle where your leg meets your lower belly (groin). This causes a rounded lump (bulge). This kind of hernia could also be:  In your scrotum, if you are male.  In folds of skin around your vagina, if you are male. There are three types of inguinal hernias. These include:  Hernias that can be pushed back into the belly (are reducible). This type rarely causes pain.  Hernias that cannot be pushed back into the belly (are incarcerated).  Hernias that cannot be pushed back into the belly and lose their blood supply (are strangulated). This type needs emergency surgery. If you do not have symptoms, you may not need treatment. If you have symptoms or a large hernia, you may need surgery. Follow these instructions at home: Lifestyle  Do these things if told by your doctor so you do not have trouble pooping (constipation): ? Drink enough fluid to keep your pee (urine) pale yellow. ? Eat foods that have a lot of fiber. These include fresh fruits and vegetables, whole grains, and beans. ? Limit foods that are high in fat and processed sugars. These include foods that are fried or sweet. ? Take medicine for trouble pooping.  Avoid lifting heavy objects.  Avoid standing for long amounts of time.  Do not use any products that contain nicotine or tobacco. These include cigarettes and e-cigarettes. If you need help quitting, ask your doctor.  Stay at a healthy weight. General instructions  You may try to push your hernia in by very gently pressing on it when you are lying down. Do not try to force the bulge back in if it will not push in easily.  Watch your hernia for any changes in shape, size, or color. Tell your doctor if you see any changes.  Take over-the-counter and prescription medicines only as told by your doctor.  Keep all follow-up  visits as told by your doctor. This is important. Contact a doctor if:  You have a fever.  You have new symptoms.  Your symptoms get worse. Get help right away if:  The area where your leg meets your lower belly has: ? Pain that gets worse suddenly. ? A bulge that gets bigger suddenly, and it does not get smaller after that. ? A bulge that turns red or purple. ? A bulge that is painful when you touch it.  You are a man, and your scrotum: ? Suddenly feels painful. ? Suddenly changes in size.  You cannot push the hernia in by very gently pressing on it when you are lying down. Do not try to force the bulge back in if it will not push in easily.  You feel sick to your stomach (nauseous), and that feeling does not go away.  You throw up (vomit), and that keeps happening.  You have a fast heartbeat.  You cannot poop (have a bowel movement) or pass gas. These symptoms may be an emergency. Do not wait to see if the symptoms will go away. Get medical help right away. Call your local emergency services (911 in the [place] Summary  An inguinal hernia is when fat or your intestines push through a weak spot in a muscle where your leg meets your lower belly (groin). This causes a rounded lump (bulge).  If you do not have symptoms, you may not need treatment. If  you have symptoms or a large hernia, you may need surgery.  Avoid lifting heavy objects. Also avoid standing for long amounts of time.  Do not try to force the bulge back in if it will not push in easily. This information is not intended to replace advice given to you by your health care provider. Make sure you discuss any questions you have with your health care provider. Document Released: 01/23/2006 Document Revised: 01/24/2017 Document Reviewed: 09/24/2016 Elsevier Patient Education  2020 [employer].

## 2018-09-14 NOTE — Progress Notes (Signed)
Patient ID: Maxwell Stanley, male   DOB: 1934-05-10, 84 y.o.   MRN: [id number]  Chief Complaint  Patient presents with  . Pre-op Exam    RIH repair 10/01/2018    HPI Maxwell Stanley is a 84 y.o. male.   I first saw him in August for a reducible right inguinal hernia.  He is scheduled for surgery on 25 September.  This is an interval visit to update his history and physical.  My initial consult note is copied here:   "He was referred by his primary care doctor, Dr. Halina Maidens, for evaluation of a right inguinal hernia.  Mr. Paller states that he has had 2 episodes of right groin pain each lasting about an hour.  The pain did not radiate and was brought on by activity.  He saw his primary care doctor who diagnosed him with a right inguinal hernia.  He is currently not having any pain.  He denies any burning with urination.  He does have BPH and is on Flomax so he does have some difficulty starting his stream.  He denies any obstipation, nausea, or vomiting.  He denies constipation or having to strain with bowel movements.  He says that the "knot" in his groin that was there when he saw Dr. [name] is no longer present.  He does work on [employer] and engages in a fair amount of lifting, but now has a lift table that he uses more regularly."  Since her last visit, he endorses a single episode of right groin pain.  This occurred over the past weekend.  It happened while at rest and spontaneously resolved.  He has been struggling a bit with high blood pressure.  His medications have been adjusted and now he feels like he is on the low side.  He has a follow-up appointment with Dr. [name] this coming Friday to reevaluate and make any additional adjustments..   Past Medical History:  Diagnosis Date  . BPH (benign prostatic hypertrophy)    with BOO  . Diabetes mellitus without complication ([place]) 0000000  . Hypertension   . Nocturia   . Organic impotence   . Urinary frequency     Past  Surgical History:  Procedure Laterality Date  . ANKLE ARTHROSCOPY WITH OPEN REDUCTION INTERNAL FIXATION (ORIF) Left 1998  . HEMORRHOID SURGERY    . HERNIA REPAIR Left 0000000   umbilical and left inguinal  . KNEE ARTHROSCOPY Right 2006  . skin cancer resection Right 04/07/2018   ON hand.  [name] THUMB ARTHROSCOPY Right 2013   tendon flap to joint    Family History  Problem Relation Age of Onset  . Diabetes Mother   . Coronary artery disease Father   . Hypertension Father   . Hyperlipidemia Father   . Leukemia Father     Social History Social History   Tobacco Use  . Smoking status: Never Smoker  . Smokeless tobacco: Never Used  . Tobacco comment: smoking cessation materials not required  Substance Use Topics  . Alcohol use: No    Alcohol/week: 0.0 standard drinks  . Drug use: No    Allergies  Allergen Reactions  . Amlodipine     Other reaction(s): Edema  . Codeine   . Ceftriaxone Rash  . Penicillin G Rash    Current Outpatient Medications  Medication Sig Dispense Refill  . acetaminophen (TYLENOL) 500 MG tablet Take 500 mg by mouth every 6 (six) hours as needed.    [name] amLODipine (NORVASC)  2.5 MG tablet Take 1 tablet (2.5 mg total) by mouth daily. 30 tablet 0  . aspirin 81 MG tablet Take 81 mg by mouth daily.      [name] atenolol (TENORMIN) 50 MG tablet Take 1 tablet (50 mg total) by mouth at bedtime. 90 tablet 3  . cholecalciferol (VITAMIN D) 1000 UNITS tablet Take 1 tablet by mouth daily.    [name] docusate sodium (COLACE) 100 MG capsule Take by mouth.    . fluticasone (FLONASE) 50 MCG/ACT nasal spray USE 2 SPRAYS IN EACH       NOSTRIL DAILY 48 g 3  . glucose blood (ONE TOUCH ULTRA TEST) test strip Use as instructed 100 each 12  . hydrochlorothiazide (MICROZIDE) 12.5 MG capsule TAKE 1 CAPSULE DAILY (IN   PLACE OF COMBINATION       TABLET) 90 capsule 3  . JANUVIA 100 MG tablet TAKE 1 TABLET DAILY (Patient taking differently: Take 100 mg by mouth daily. ) 90 tablet 3  . losartan  (COZAAR) 100 MG tablet Take 1 tablet (100 mg total) by mouth daily. 90 tablet 3  . meloxicam (MOBIC) 15 MG tablet TK 1 T PO QD  3  . metFORMIN (GLUCOPHAGE-XR) 500 MG 24 hr tablet TAKE 1 TABLET TWICE A DAY 180 tablet 3  . Misc Natural Products (OSTEO BI-FLEX TRIPLE STRENGTH) TABS Take 2 tablets by mouth daily.    . Multiple Vitamins-Minerals (CENTRUM SILVER) tablet Take 1 tablet by mouth daily.      [name] DELICA LANCETS 99991111 MISC 1 each by Does not apply route 2 (two) times daily. 100 each 12  . pantoprazole (PROTONIX) 40 MG tablet Take 1 tablet (40 mg total) by mouth daily. 90 tablet 3  . rosuvastatin (CRESTOR) 20 MG tablet Take 1 tablet (20 mg total) by mouth at bedtime. 90 tablet 3  . tamsulosin (FLOMAX) 0.4 MG CAPS capsule TAKE 1 CAPSULE DAILY AT 2PM 90 capsule 3   No current facility-administered medications for this visit.     Review of Systems Review of Systems  All other systems reviewed and are negative.   Blood pressure (!) 173/92, pulse 76, temperature (!) 97.5 F (36.4 C), temperature source Temporal, resp. rate 16, height 6\' 2"  (1.88 m), weight 193 lb (87.5 kg), SpO2 97 %.  Physical Exam Physical Exam Vitals signs reviewed. Exam conducted with a chaperone present.  Constitutional:      General: He is not in acute distress.    Appearance: He is normal weight.  HENT:     Head: Normocephalic and atraumatic.     Nose:     Comments: Covered with a mask secondary to COVID-19 precautions    Mouth/Throat:     Comments: Covered with a mask secondary to COVID-19 precautions Eyes:     General: No scleral icterus.       Right eye: No discharge.        Left eye: No discharge.  Neck:     Musculoskeletal: Normal range of motion.     Comments: No thyromegaly or dominant thyroid masses appreciated. Cardiovascular:     Rate and Rhythm: Normal rate and regular rhythm.     Pulses: Normal pulses.  Pulmonary:     Effort: Pulmonary effort is normal.     Breath sounds: Normal  breath sounds.  Abdominal:     General: Abdomen is flat. Bowel sounds are normal.     Palpations: Abdomen is soft.     Hernia: A hernia is present.  Hernia is present in the right inguinal area.  Genitourinary:      Comments: Reducible right inguinal hernia Musculoskeletal: Normal range of motion.        General: Swelling present.     Right lower leg: Edema present.     Left lower leg: Edema present.     Comments: He has 2+ pitting edema from the ankles to the mid tibia bilaterally.  Lymphadenopathy:     Cervical: No cervical adenopathy.  Skin:    General: Skin is warm and dry.  Neurological:     General: No focal deficit present.     Mental Status: He is alert and oriented to person, place, and time.  Psychiatric:        Mood and Affect: Mood normal.        Behavior: Behavior normal.     Data Reviewed There are no new relevant data to review.  Assessment This is an 84 year old man with a reducible, but symptomatic right inguinal hernia.  He is scheduled for surgical repair on the 25th of this month.  Aside from some difficulty with blood pressure regulation, here have been no significant changes to his medical or surgical history since I saw him last.  Plan We will proceed with his hernia repair as scheduled.  His preoperative visit is slated for 22 September .I have explained the procedure, risks, and aftercare of inguinal hernia repair to [name].   Risks include but are not limited to bleeding, infection, wound problems, anesthesia, recurrence, bladder or intestine injury, urinary retention, testicular dysfunction, chronic pain, mesh problems.  He  seems to understand and agrees to proceed.  Questions were answered to his stated satisfaction.      Fredirick Maudlin 09/14/2018, 10:16 AM

## 2018-09-16 ENCOUNTER — Ambulatory Visit (INDEPENDENT_AMBULATORY_CARE_PROVIDER_SITE_OTHER): Payer: Medicare HMO | Admitting: Internal Medicine

## 2018-09-16 ENCOUNTER — Encounter: Payer: Self-pay | Admitting: Internal Medicine

## 2018-09-16 ENCOUNTER — Other Ambulatory Visit: Payer: Self-pay

## 2018-09-16 VITALS — BP 124/82 | HR 70 | Ht 74.0 in | Wt 192.0 lb

## 2018-09-16 DIAGNOSIS — I10 Essential (primary) hypertension: Secondary | ICD-10-CM

## 2018-09-16 DIAGNOSIS — Z23 Encounter for immunization: Secondary | ICD-10-CM

## 2018-09-16 DIAGNOSIS — K4090 Unilateral inguinal hernia, without obstruction or gangrene, not specified as recurrent: Secondary | ICD-10-CM | POA: Insufficient documentation

## 2018-09-16 NOTE — Progress Notes (Signed)
Date:  09/16/2018   Name:  Maxwell Stanley   DOB:  [birth year]   MRN:  [id number]   Chief Complaint: Hypertension (Flu shot.)  Hypertension This is a chronic problem. The problem has been gradually improving since onset. The problem is controlled. Pertinent negatives include no chest pain, headaches, palpitations or shortness of breath. Past treatments include angiotensin blockers and beta blockers (now losartan 50 mg bid and amlodipine 2.5 mg).  He was changed to losartan 100 mg once a day but it dropped his BP too much.  Several days ago he changed to 50 mg twice a day and has done well.  No further low readings and no symptoms. He is scheduled for hernia surgery in 2 weeks. He has mild edema of his lower legs which is unchanged since started amlodipine.  He denies constipation.  Review of Systems  Constitutional: Negative for chills, fatigue, fever and unexpected weight change.  Respiratory: Negative for cough, chest tightness, shortness of breath and wheezing.   Cardiovascular: Positive for leg swelling (minimal edema). Negative for chest pain and palpitations.  Gastrointestinal: Negative for abdominal pain, constipation and diarrhea.  Neurological: Negative for dizziness, tremors, weakness, light-headedness and headaches.    Patient Active Problem List   Diagnosis Date Noted  . DDD (degenerative disc disease), lumbar 12/23/2017  . Primary osteoarthritis of right hip 12/23/2017  . Onychomycosis of toenail 08/19/2017  . Allergic rhinitis 08/08/2016  . Carpal tunnel syndrome on left 12/12/2015  . Chronic maxillary sinusitis 04/09/2015  . Type II diabetes mellitus with complication ([place]) 123XX123  . Chronic low back pain 09/21/2014  . Lumbar canal stenosis 09/21/2014  . Abnormal prostate specific antigen 04/17/2014  . Enlarged prostate with lower urinary tract symptoms (LUTS) 04/17/2014  . CKD stage 3 secondary to diabetes ([place]) 04/17/2014  . Hyperlipidemia associated with  type 2 diabetes mellitus ([place]) 04/17/2014  . 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 Essential (primary) hypertension 04/17/2014  . Esophagitis, reflux 04/17/2014  . Current use of proton pump inhibitor 04/17/2014  . Back muscle spasm 04/17/2014  . Degenerative arthritis of finger 04/17/2014  . Foot pain, left 04/17/2014  . Breast development in males 04/17/2014  . 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 Osteoarthritis of both knees 11/03/2013  . EDEMA 08/24/2009    Allergies  Allergen Reactions  . Amlodipine     Other reaction(s): Edema  . Codeine   . Ceftriaxone Rash  . Penicillin G Rash    Past Surgical History:  Procedure Laterality Date  . ANKLE ARTHROSCOPY WITH OPEN REDUCTION INTERNAL FIXATION (ORIF) Left 1998  . HEMORRHOID SURGERY    . HERNIA REPAIR Left 0000000   umbilical and left inguinal  . KNEE ARTHROSCOPY Right 2006  . skin cancer resection Right 04/07/2018   ON hand.  [name] THUMB ARTHROSCOPY Right 2013   tendon flap to joint    Social History   Tobacco Use  . Smoking status: Never Smoker  . Smokeless tobacco: Never Used  . Tobacco comment: smoking cessation materials not required  Substance Use Topics  . Alcohol use: No    Alcohol/week: 0.0 standard drinks  . Drug use: No     Medication list has been reviewed and updated.  Current Meds  Medication Sig  . acetaminophen (TYLENOL) 500 MG tablet Take 500 mg by mouth every 6 (six) hours as needed.  [name] amLODipine (NORVASC) 2.5 MG tablet Take 1 tablet (2.5 mg total) by mouth daily.  [name] aspirin 81 MG  tablet Take 81 mg by mouth daily.    [name] atenolol (TENORMIN) 50 MG tablet Take 1 tablet (50 mg total) by mouth at bedtime.  . cholecalciferol (VITAMIN D) 1000 UNITS tablet Take 1 tablet by mouth daily.  [name] docusate sodium (COLACE) 100 MG capsule Take by mouth.  . fluticasone (FLONASE) 50 MCG/ACT nasal spray USE 2 SPRAYS IN EACH       NOSTRIL DAILY  . glucose blood (ONE TOUCH ULTRA TEST) test strip Use as  instructed  . hydrochlorothiazide (MICROZIDE) 12.5 MG capsule TAKE 1 CAPSULE DAILY (IN   PLACE OF COMBINATION       TABLET)  . JANUVIA 100 MG tablet TAKE 1 TABLET DAILY (Patient taking differently: Take 100 mg by mouth daily. )  . losartan (COZAAR) 100 MG tablet Take 1 tablet (100 mg total) by mouth daily. (Patient taking differently: Take 50 mg by mouth 2 (two) times daily. )  . meloxicam (MOBIC) 15 MG tablet TK 1 T PO QD  . metFORMIN (GLUCOPHAGE-XR) 500 MG 24 hr tablet TAKE 1 TABLET TWICE A DAY  . Misc Natural Products (OSTEO BI-FLEX TRIPLE STRENGTH) TABS Take 2 tablets by mouth daily.  . Multiple Vitamins-Minerals (CENTRUM SILVER) tablet Take 1 tablet by mouth daily.    [name] DELICA LANCETS 99991111 MISC 1 each by Does not apply route 2 (two) times daily.  . pantoprazole (PROTONIX) 40 MG tablet Take 1 tablet (40 mg total) by mouth daily.  . rosuvastatin (CRESTOR) 20 MG tablet Take 1 tablet (20 mg total) by mouth at bedtime.  . tamsulosin (FLOMAX) 0.4 MG CAPS capsule TAKE 1 CAPSULE DAILY AT [facility]    [facility] 2/9 Scores 08/26/2018 07/28/2018 04/21/2018 09/09/2017  PHQ - 2 Score 0 0 0 0  PHQ- 9 Score 0 0 0 0    BP Readings from Last 3 Encounters:  09/16/18 124/82  09/14/18 (!) 173/92  08/26/18 (!) 148/94    Physical Exam Vitals signs and nursing note reviewed.  Constitutional:      General: He is not in acute distress.    Appearance: Normal appearance. He is well-developed.  HENT:     Head: Normocephalic and atraumatic.  Neck:     Musculoskeletal: Normal range of motion.  Cardiovascular:     Rate and Rhythm: Normal rate and regular rhythm.     Heart sounds: No murmur.  Pulmonary:     Effort: Pulmonary effort is normal. No respiratory distress.     Breath sounds: No wheezing or rhonchi.  Musculoskeletal: Normal range of motion.        General: Swelling present.  Lymphadenopathy:     Cervical: No cervical adenopathy.  Skin:    General: Skin is warm and dry.     Capillary Refill:  Capillary refill takes less than 2 seconds.     Findings: No rash.  Neurological:     Mental Status: He is alert and oriented to person, place, and time.  Psychiatric:        Behavior: Behavior normal.        Thought Content: Thought content normal.     Wt Readings from Last 3 Encounters:  09/16/18 192 lb (87.1 kg)  09/14/18 193 lb (87.5 kg)  08/26/18 191 lb (86.6 kg)    BP 124/82   Pulse 70   Ht 6\' 2"  (1.88 m)   Wt 192 lb (87.1 kg)   SpO2 97%   BMI 24.65 kg/m   Assessment and Plan: 1. Essential (primary) hypertension Clinically  stable exam with well controlled BP.   Tolerating medications, losartan 50 mg bid and amlodipine 2.5 mg, without side effects at this time. Pt to continue current regimen and low sodium diet; benefits of regular exercise as able discussed.   2. Need for immunization against influenza - Flu Vaccine QUAD High Dose(Fluad)   Partially dictated using Editor, commissioning. Any errors are unintentional.  Halina Maidens, MD [facility]  09/16/2018

## 2018-09-17 ENCOUNTER — Other Ambulatory Visit: Payer: Self-pay | Admitting: Internal Medicine

## 2018-09-17 ENCOUNTER — Ambulatory Visit: Payer: Medicare HMO

## 2018-09-17 DIAGNOSIS — I10 Essential (primary) hypertension: Secondary | ICD-10-CM

## 2018-09-28 ENCOUNTER — Other Ambulatory Visit: Payer: Self-pay

## 2018-09-28 ENCOUNTER — Other Ambulatory Visit: Payer: Medicare HMO

## 2018-09-28 ENCOUNTER — Encounter: Payer: Self-pay | Admitting: Anesthesiology

## 2018-09-28 ENCOUNTER — Encounter
Admission: RE | Admit: 2018-09-28 | Discharge: 2018-09-28 | Disposition: A | Discharge status: Home / Self Care | Payer: Medicare HMO | Source: Ambulatory Visit | Attending: General Surgery | Admitting: General Surgery

## 2018-09-28 ENCOUNTER — Encounter: Payer: Self-pay | Admitting: *Deleted

## 2018-09-28 DIAGNOSIS — I10 Essential (primary) hypertension: Secondary | ICD-10-CM | POA: Diagnosis not present

## 2018-09-28 DIAGNOSIS — Z01818 Encounter for other preprocedural examination: Secondary | ICD-10-CM | POA: Diagnosis not present

## 2018-09-28 DIAGNOSIS — Z20828 Contact with and (suspected) exposure to other viral communicable diseases: Secondary | ICD-10-CM | POA: Insufficient documentation

## 2018-09-28 HISTORY — DX: Gastro-esophageal reflux disease without esophagitis: K21.9

## 2018-09-28 HISTORY — DX: Other complications of anesthesia, initial encounter: T88.59XA

## 2018-09-28 HISTORY — DX: Malignant (primary) neoplasm, unspecified: C80.1

## 2018-09-28 HISTORY — DX: Other specified postprocedural states: R11.2

## 2018-09-28 HISTORY — DX: Other specified postprocedural states: Z98.890

## 2018-09-28 LAB — POTASSIUM: Potassium: 4.1 mmol/L (ref 3.5–5.1)

## 2018-09-28 NOTE — Progress Notes (Unsigned)
Faxes to Dr. [name] office for medical clearance. [phone]

## 2018-09-28 NOTE — Pre-Procedure Instructions (Signed)
PT HAD EKG DONE TODAY WHICH SHOWED NEW ONSET AFIB-PT DENIES EVER BEING TOLD HE WAS IN AFIB-DR ADAMS WANTS MEDICAL CLEARANCE. CALLED ANGIE AT DR CANNONS OFFICE AND LET HER KNOW. I TOLD HER I AM FAXING THIS TO DR Celine Ahr ALONG WITH DR [name]. ANGIE WILL F/U WITH THIS TOMORROW

## 2018-09-28 NOTE — Patient Instructions (Addendum)
Your procedure is scheduled on: 10-01-18 FRIDAY Report to Same Day Surgery 2nd floor medical mall [facility] Entrance-take elevator on left to 2nd floor.  Check in with surgery information desk.) To find out your arrival time please call [phone] between 1PM - 3PM on 09-30-18 THURSDAY  Remember: Instructions that are not followed completely may result in serious medical risk, up to and including death, or upon the discretion of your surgeon and anesthesiologist your surgery may need to be rescheduled.    _x___ 1. Do not eat food after midnight the night before your procedure. NO GUM OR CANDY AFTER MIDNIGHT. You may drink WATER up to 2 hours before you are scheduled to arrive at the hospital for your procedure.  Do not drink WATER  within 2 hours of your scheduled arrival to the hospital.  Type 1 and type 2 diabetics should only drink water.   ____Ensure clear carbohydrate drink on the way to the hospital for bariatric patients  ____Ensure clear carbohydrate drink 3 hours before surgery.     __x__ 2. No Alcohol for 24 hours before or after surgery.   __x__3. No Smoking or e-cigarettes for 24 prior to surgery.  Do not use any chewable tobacco products for at least 6 hour prior to surgery   ____  4. Bring all medications with you on the day of surgery if instructed.    __x__ 5. Notify your doctor if there is any change in your medical condition     (cold, fever, infections).    x___6. On the morning of surgery brush your teeth with toothpaste and water.  You may rinse your mouth with mouth wash if you wish.  Do not swallow any toothpaste or mouthwash.   Do not wear jewelry, make-up, hairpins, clips or nail polish.  Do not wear lotions, powders, or perfumes.   Do not shave 48 hours prior to surgery. Men may shave face and neck.  Do not bring valuables to the hospital.    [facility] is not responsible for any belongings or valuables.               Contacts, dentures or bridgework  may not be worn into surgery.  Leave your suitcase in the car. After surgery it may be brought to your room.  For patients admitted to the hospital, discharge time is determined by your treatment team.  _  Patients discharged the day of surgery will not be allowed to drive home.  You will need someone to drive you home and stay with you the night of your procedure.    Please read over the following fact sheets that you were given:   [facility] Preparing for Surgery   _x___ TAKE THE FOLLOWING MEDICATION THE MORNING OF SURGERY WITH A SMALL SIP OF WATER. These include:  1. AMLODIPINE (NORVASC)  2. FLOMAX (TAMSULOSIN)  3. PROTONIX (PANTOPRAZOLE)  4. TAKE AN EXTRA PROTONIX THE NIGHT BEFORE YOUR SURGERY  5.  6.  ____Fleets enema or Magnesium Citrate as directed.   _x___ Use CHG Soap or sage wipes as directed on instruction sheet   ____ Use inhalers on the day of surgery and bring to hospital day of surgery  X___ Stop Metformin 2 days prior to surgery-LAST DOSE TODAY (09-28-18) TUESDAY    ____ Take 1/2 of usual insulin dose the night before surgery and none on the morning surgery.   _x___ Follow recommendations from Cardiologist, Pulmonologist or PCP regarding stopping Aspirin, Coumadin, Plavix ,Eliquis, Effient,  or Pradaxa, and Pletal-STOP YOUR ASPIRIN NOW  X____Stop Anti-inflammatories such as Advil, Aleve, Ibuprofen, Motrin, Naproxen,MELOXICAM (MOBIC) Naprosyn, Goodies powders or aspirin products NOW-OK to take Tylenol    _x___ Stop supplements until after surgery-STOP OSTEO BI-FLEX NOW-MAY RESUME AFTER SURGERY   ____ Bring C-Pap to the hospital.

## 2018-09-29 ENCOUNTER — Telehealth: Payer: Self-pay

## 2018-09-29 LAB — SARS CORONAVIRUS 2 (TAT 6-24 HRS): SARS Coronavirus 2: NEGATIVE

## 2018-09-29 NOTE — Telephone Encounter (Signed)
Spoke with Dr Ron Agee office and she is out of town on vacation and would not be able to clear the patient for surgery until Monday at the earliest. They said no other provider would be able to do this. The patient's surgery will need to be rescheduled for after 10/04/18.

## 2018-09-29 NOTE — Telephone Encounter (Signed)
I have advised Dr Celine Ahr that surgery has been cancelled at this time. Patient has also been advised. I did inform the patient that once we receive medical clearance that we can then reschedule his surgery. Dr [name] will return on Monday 10/04/18 and will review the EKG. I will follow up with the clearance and reschedule surgery when obtained.

## 2018-10-01 ENCOUNTER — Ambulatory Visit: Admission: RE | Admit: 2018-10-01 | Payer: Medicare HMO | Source: Home / Self Care | Admitting: General Surgery

## 2018-10-01 ENCOUNTER — Encounter: Admission: RE | Payer: Self-pay | Source: Home / Self Care

## 2018-10-01 SURGERY — REPAIR, HERNIA, INGUINAL, ADULT
Anesthesia: Choice | Laterality: Right

## 2018-10-04 ENCOUNTER — Other Ambulatory Visit: Payer: Self-pay

## 2018-10-04 ENCOUNTER — Telehealth: Payer: Self-pay

## 2018-10-04 DIAGNOSIS — I4891 Unspecified atrial fibrillation: Secondary | ICD-10-CM

## 2018-10-04 NOTE — Telephone Encounter (Signed)
Patient notified of appointment with [facility] 10/12/2018 @ 2:15 pm.  Cardiac Clearance faxed to [facility] [phone].  Medical Clearance received from Dr.Berglund and is requesting patient to see Cardiology evaluation.

## 2018-10-05 ENCOUNTER — Ambulatory Visit: Payer: Medicare HMO | Admitting: Cardiology

## 2018-10-12 DIAGNOSIS — I480 Paroxysmal atrial fibrillation: Secondary | ICD-10-CM | POA: Diagnosis not present

## 2018-10-12 DIAGNOSIS — E782 Mixed hyperlipidemia: Secondary | ICD-10-CM | POA: Diagnosis not present

## 2018-10-12 DIAGNOSIS — I10 Essential (primary) hypertension: Secondary | ICD-10-CM | POA: Diagnosis not present

## 2018-10-12 DIAGNOSIS — E1122 Type 2 diabetes mellitus with diabetic chronic kidney disease: Secondary | ICD-10-CM | POA: Diagnosis not present

## 2018-10-12 DIAGNOSIS — E1121 Type 2 diabetes mellitus with diabetic nephropathy: Secondary | ICD-10-CM | POA: Diagnosis not present

## 2018-10-12 DIAGNOSIS — N183 Chronic kidney disease, stage 3 unspecified: Secondary | ICD-10-CM | POA: Diagnosis not present

## 2018-10-27 DIAGNOSIS — I6523 Occlusion and stenosis of bilateral carotid arteries: Secondary | ICD-10-CM | POA: Diagnosis not present

## 2018-10-27 DIAGNOSIS — I480 Paroxysmal atrial fibrillation: Secondary | ICD-10-CM | POA: Diagnosis not present

## 2018-10-29 DIAGNOSIS — E782 Mixed hyperlipidemia: Secondary | ICD-10-CM | POA: Diagnosis not present

## 2018-10-29 DIAGNOSIS — Z8679 Personal history of other diseases of the circulatory system: Secondary | ICD-10-CM | POA: Insufficient documentation

## 2018-10-29 DIAGNOSIS — I4819 Other persistent atrial fibrillation: Secondary | ICD-10-CM | POA: Insufficient documentation

## 2018-10-29 DIAGNOSIS — I480 Paroxysmal atrial fibrillation: Secondary | ICD-10-CM | POA: Diagnosis not present

## 2018-10-29 DIAGNOSIS — I10 Essential (primary) hypertension: Secondary | ICD-10-CM | POA: Diagnosis not present

## 2018-11-01 NOTE — Progress Notes (Signed)
Received Cardiac Clearance from [facility]. Patient is optimized for surgery at [place].

## 2018-11-02 ENCOUNTER — Telehealth: Payer: Self-pay

## 2018-11-02 ENCOUNTER — Other Ambulatory Visit: Payer: Self-pay | Admitting: General Surgery

## 2018-11-02 NOTE — Telephone Encounter (Signed)
Medical clearance received from Dr.Berglund low risk for surgery.

## 2018-11-03 ENCOUNTER — Telehealth: Payer: Self-pay

## 2018-11-03 NOTE — Progress Notes (Signed)
Spoke with Dr Alveria Apley office and patient is on Eliquis. He may stop this medication 3 days prior to to his surgery. We have received an updated clearance form.

## 2018-11-03 NOTE — Telephone Encounter (Addendum)
Patient notified of Covid Testing date and Surgery date and how to get arrival time.   Surgery Date: 11/12/18 Preadmission Testing Date: Pre admit testing already done on 09/28/18, just follow their instructions.  Patient aware to stop Eliquis 3 days prior to surgery per Dr Nehemiah Massed. Covid Testing Date: 11/09/18 - patient advised to go to the [place] ([place]) between 8-10:30 am.  Patient has been made aware to call [phone], between 1-3:00pm the day before surgery, to find out what time to arrive.    Emmi Video sent via [facility] Surgical Video and Covid Education Video.

## 2018-11-07 DIAGNOSIS — H269 Unspecified cataract: Secondary | ICD-10-CM

## 2018-11-07 HISTORY — DX: Unspecified cataract: H26.9

## 2018-11-09 ENCOUNTER — Other Ambulatory Visit: Payer: Self-pay

## 2018-11-09 ENCOUNTER — Other Ambulatory Visit
Admission: RE | Admit: 2018-11-09 | Discharge: 2018-11-09 | Disposition: A | Discharge status: Home / Self Care | Payer: Medicare HMO | Source: Ambulatory Visit | Attending: General Surgery | Admitting: General Surgery

## 2018-11-09 DIAGNOSIS — Z20828 Contact with and (suspected) exposure to other viral communicable diseases: Secondary | ICD-10-CM | POA: Diagnosis not present

## 2018-11-09 DIAGNOSIS — Z01812 Encounter for preprocedural laboratory examination: Secondary | ICD-10-CM | POA: Diagnosis not present

## 2018-11-09 LAB — SARS CORONAVIRUS 2 (TAT 6-24 HRS): SARS Coronavirus 2: NEGATIVE

## 2018-11-10 ENCOUNTER — Other Ambulatory Visit: Payer: Self-pay | Admitting: Internal Medicine

## 2018-11-10 DIAGNOSIS — E1169 Type 2 diabetes mellitus with other specified complication: Secondary | ICD-10-CM

## 2018-11-10 DIAGNOSIS — K2100 Gastro-esophageal reflux disease with esophagitis, without bleeding: Secondary | ICD-10-CM

## 2018-11-10 DIAGNOSIS — I10 Essential (primary) hypertension: Secondary | ICD-10-CM

## 2018-11-10 MED ORDER — HEPARIN SODIUM (PORCINE) 1000 UNIT/ML IJ SOLN
INTRAMUSCULAR | Status: AC
  Filled 2018-11-10: qty 2

## 2018-11-11 MED ORDER — PROPOFOL 10 MG/ML IV BOLUS
INTRAVENOUS | Status: AC
  Filled 2018-11-11: qty 20

## 2018-11-12 ENCOUNTER — Encounter: Payer: Self-pay | Admitting: *Deleted

## 2018-11-12 ENCOUNTER — Ambulatory Visit: Payer: Medicare HMO | Admitting: Anesthesiology

## 2018-11-12 ENCOUNTER — Ambulatory Visit
Admission: RE | Admit: 2018-11-12 | Discharge: 2018-11-12 | Disposition: A | Discharge status: Home / Self Care | Payer: Medicare HMO | Attending: General Surgery | Admitting: General Surgery

## 2018-11-12 ENCOUNTER — Other Ambulatory Visit: Payer: Self-pay

## 2018-11-12 ENCOUNTER — Encounter
Admission: RE | Disposition: A | Discharge status: Home / Self Care | Payer: Self-pay | Source: Home / Self Care | Attending: General Surgery

## 2018-11-12 DIAGNOSIS — I10 Essential (primary) hypertension: Secondary | ICD-10-CM | POA: Insufficient documentation

## 2018-11-12 DIAGNOSIS — M170 Bilateral primary osteoarthritis of knee: Secondary | ICD-10-CM | POA: Diagnosis not present

## 2018-11-12 DIAGNOSIS — Z7901 Long term (current) use of anticoagulants: Secondary | ICD-10-CM | POA: Diagnosis not present

## 2018-11-12 DIAGNOSIS — E785 Hyperlipidemia, unspecified: Secondary | ICD-10-CM | POA: Insufficient documentation

## 2018-11-12 DIAGNOSIS — Z885 Allergy status to narcotic agent status: Secondary | ICD-10-CM | POA: Diagnosis not present

## 2018-11-12 DIAGNOSIS — Z7984 Long term (current) use of oral hypoglycemic drugs: Secondary | ICD-10-CM | POA: Insufficient documentation

## 2018-11-12 DIAGNOSIS — Z7982 Long term (current) use of aspirin: Secondary | ICD-10-CM | POA: Diagnosis not present

## 2018-11-12 DIAGNOSIS — N138 Other obstructive and reflux uropathy: Secondary | ICD-10-CM | POA: Insufficient documentation

## 2018-11-12 DIAGNOSIS — Z880 Allergy status to penicillin: Secondary | ICD-10-CM | POA: Insufficient documentation

## 2018-11-12 DIAGNOSIS — K219 Gastro-esophageal reflux disease without esophagitis: Secondary | ICD-10-CM | POA: Insufficient documentation

## 2018-11-12 DIAGNOSIS — E119 Type 2 diabetes mellitus without complications: Secondary | ICD-10-CM | POA: Diagnosis not present

## 2018-11-12 DIAGNOSIS — N401 Enlarged prostate with lower urinary tract symptoms: Secondary | ICD-10-CM | POA: Insufficient documentation

## 2018-11-12 DIAGNOSIS — Z79899 Other long term (current) drug therapy: Secondary | ICD-10-CM | POA: Diagnosis not present

## 2018-11-12 DIAGNOSIS — K4090 Unilateral inguinal hernia, without obstruction or gangrene, not specified as recurrent: Secondary | ICD-10-CM

## 2018-11-12 DIAGNOSIS — Z881 Allergy status to other antibiotic agents status: Secondary | ICD-10-CM | POA: Insufficient documentation

## 2018-11-12 DIAGNOSIS — D176 Benign lipomatous neoplasm of spermatic cord: Secondary | ICD-10-CM | POA: Diagnosis not present

## 2018-11-12 HISTORY — PX: INGUINAL HERNIA REPAIR: SHX194

## 2018-11-12 LAB — CBC
HCT: 40.9 % (ref 39.0–52.0)
Hemoglobin: 14.3 g/dL (ref 13.0–17.0)
MCH: 32.9 pg (ref 26.0–34.0)
MCHC: 35 g/dL (ref 30.0–36.0)
MCV: 94.2 fL (ref 80.0–100.0)
Platelets: 198 10*3/uL (ref 150–400)
RBC: 4.34 MIL/uL (ref 4.22–5.81)
RDW: 12.5 % (ref 11.5–15.5)
WBC: 6 10*3/uL (ref 4.0–10.5)
nRBC: 0 % (ref 0.0–0.2)

## 2018-11-12 LAB — GLUCOSE, CAPILLARY
Glucose-Capillary: 172 mg/dL — ABNORMAL HIGH (ref 70–99)
Glucose-Capillary: 174 mg/dL — ABNORMAL HIGH (ref 70–99)

## 2018-11-12 SURGERY — REPAIR, HERNIA, INGUINAL, ADULT
Anesthesia: General | Laterality: Right

## 2018-11-12 MED ORDER — LIDOCAINE-EPINEPHRINE 1 %-1:100000 IJ SOLN
INTRAMUSCULAR | Status: AC
  Filled 2018-11-12: qty 1

## 2018-11-12 MED ORDER — FENTANYL CITRATE (PF) 100 MCG/2ML IJ SOLN: 25.0000 ug | INTRAMUSCULAR | Status: DC | PRN

## 2018-11-12 MED ORDER — GABAPENTIN 300 MG PO CAPS
300.0000 mg | ORAL_CAPSULE | ORAL | Status: AC
  Administered 2018-11-12: 300 mg via ORAL

## 2018-11-12 MED ORDER — VANCOMYCIN HCL IN DEXTROSE 1-5 GM/200ML-% IV SOLN: 1000.0000 mg | INTRAVENOUS | Status: DC

## 2018-11-12 MED ORDER — BUPIVACAINE HCL (PF) 0.25 % IJ SOLN
INTRAMUSCULAR | Status: AC | PRN
  Administered 2018-11-12: 5 mL/h

## 2018-11-12 MED ORDER — SEVOFLURANE IN SOLN
RESPIRATORY_TRACT | Status: AC
  Filled 2018-11-12: qty 250

## 2018-11-12 MED ORDER — FENTANYL CITRATE (PF) 250 MCG/5ML IJ SOLN
INTRAMUSCULAR | Status: AC
  Filled 2018-11-12: qty 5

## 2018-11-12 MED ORDER — ONDANSETRON HCL 4 MG/2ML IJ SOLN
INTRAMUSCULAR | Status: DC | PRN
  Administered 2018-11-12: 4 mg via INTRAVENOUS

## 2018-11-12 MED ORDER — CHLORHEXIDINE GLUCONATE CLOTH 2 % EX PADS
6.0000 | MEDICATED_PAD | Freq: Once | CUTANEOUS | Status: AC
  Administered 2018-11-12: 6 via TOPICAL

## 2018-11-12 MED ORDER — LIDOCAINE HCL (CARDIAC) PF 100 MG/5ML IV SOSY
PREFILLED_SYRINGE | INTRAVENOUS | Status: DC | PRN
  Administered 2018-11-12: 100 mg via INTRAVENOUS

## 2018-11-12 MED ORDER — DEXAMETHASONE SODIUM PHOSPHATE 10 MG/ML IJ SOLN
INTRAMUSCULAR | Status: DC | PRN
  Administered 2018-11-12: 4 mg via INTRAVENOUS

## 2018-11-12 MED ORDER — CELECOXIB 200 MG PO CAPS
ORAL_CAPSULE | ORAL | Status: AC
  Filled 2018-11-12: qty 1

## 2018-11-12 MED ORDER — SODIUM CHLORIDE 0.9 % IV SOLN
INTRAVENOUS | Status: DC
  Administered 2018-11-12: 08:00:00 via INTRAVENOUS

## 2018-11-12 MED ORDER — IBUPROFEN 800 MG PO TABS: 800.0000 mg | ORAL_TABLET | Freq: Three times a day (TID) | ORAL | 0 refills | Status: DC | PRN

## 2018-11-12 MED ORDER — GLYCOPYRROLATE 0.2 MG/ML IJ SOLN
INTRAMUSCULAR | Status: DC | PRN
  Administered 2018-11-12: 0.2 mg via INTRAVENOUS

## 2018-11-12 MED ORDER — SODIUM CHLORIDE 0.9 % IV SOLN
INTRAVENOUS | Status: DC | PRN
  Administered 2018-11-12: 10:00:00 30 ug/min via INTRAVENOUS

## 2018-11-12 MED ORDER — BUPIVACAINE LIPOSOME 1.3 % IJ SUSP: 20.0000 mL | Freq: Once | INTRAMUSCULAR | Status: DC

## 2018-11-12 MED ORDER — LIDOCAINE-EPINEPHRINE 1 %-1:100000 IJ SOLN
INTRAMUSCULAR | Status: DC | PRN
  Administered 2018-11-12: 5 mL

## 2018-11-12 MED ORDER — ACETAMINOPHEN 500 MG PO TABS
ORAL_TABLET | ORAL | Status: AC
  Filled 2018-11-12: qty 2

## 2018-11-12 MED ORDER — ACETAMINOPHEN 500 MG PO TABS
1000.0000 mg | ORAL_TABLET | ORAL | Status: AC
  Administered 2018-11-12: 1000 mg via ORAL

## 2018-11-12 MED ORDER — BUPIVACAINE HCL (PF) 0.25 % IJ SOLN
INTRAMUSCULAR | Status: AC
  Filled 2018-11-12: qty 30

## 2018-11-12 MED ORDER — BUPIVACAINE LIPOSOME 1.3 % IJ SUSP
INTRAMUSCULAR | Status: DC | PRN
  Administered 2018-11-12: 20 mL

## 2018-11-12 MED ORDER — VANCOMYCIN HCL 1000 MG IV SOLR
INTRAVENOUS | Status: DC | PRN
  Administered 2018-11-12: 1000 mg via INTRAVENOUS

## 2018-11-12 MED ORDER — LIDOCAINE HCL (PF) 2 % IJ SOLN
INTRAMUSCULAR | Status: AC
  Filled 2018-11-12: qty 10

## 2018-11-12 MED ORDER — ONDANSETRON HCL 4 MG/2ML IJ SOLN: 4.0000 mg | Freq: Once | INTRAMUSCULAR | Status: DC | PRN

## 2018-11-12 MED ORDER — PHENYLEPHRINE HCL (PRESSORS) 10 MG/ML IV SOLN
INTRAVENOUS | Status: DC | PRN
  Administered 2018-11-12 (×3): 100 ug via INTRAVENOUS

## 2018-11-12 MED ORDER — CELECOXIB 200 MG PO CAPS
200.0000 mg | ORAL_CAPSULE | ORAL | Status: AC
  Administered 2018-11-12: 200 mg via ORAL

## 2018-11-12 MED ORDER — DEXAMETHASONE SODIUM PHOSPHATE 10 MG/ML IJ SOLN
INTRAMUSCULAR | Status: AC
  Filled 2018-11-12: qty 1

## 2018-11-12 MED ORDER — TRAMADOL HCL 50 MG PO TABS: 50.0000 mg | ORAL_TABLET | Freq: Four times a day (QID) | ORAL | 0 refills | Status: DC | PRN

## 2018-11-12 MED ORDER — VANCOMYCIN HCL IN DEXTROSE 1-5 GM/200ML-% IV SOLN
INTRAVENOUS | Status: AC
  Filled 2018-11-12: qty 200

## 2018-11-12 MED ORDER — FAMOTIDINE 20 MG PO TABS
ORAL_TABLET | ORAL | Status: AC
  Filled 2018-11-12: qty 1

## 2018-11-12 MED ORDER — FAMOTIDINE 20 MG PO TABS
20.0000 mg | ORAL_TABLET | Freq: Once | ORAL | Status: AC
  Administered 2018-11-12: 08:00:00 20 mg via ORAL

## 2018-11-12 MED ORDER — BUPIVACAINE LIPOSOME 1.3 % IJ SUSP
INTRAMUSCULAR | Status: AC
  Filled 2018-11-12: qty 20

## 2018-11-12 MED ORDER — PROPOFOL 10 MG/ML IV BOLUS
INTRAVENOUS | Status: DC | PRN
  Administered 2018-11-12: 100 mg via INTRAVENOUS
  Administered 2018-11-12: 20 mg via INTRAVENOUS

## 2018-11-12 MED ORDER — GABAPENTIN 300 MG PO CAPS
ORAL_CAPSULE | ORAL | Status: AC
  Filled 2018-11-12: qty 1

## 2018-11-12 MED ORDER — ONDANSETRON HCL 4 MG/2ML IJ SOLN
INTRAMUSCULAR | Status: AC
  Filled 2018-11-12: qty 2

## 2018-11-12 SURGICAL SUPPLY — 36 items
CANISTER SUCT 1200ML W/VALVE (MISCELLANEOUS) ×2 IMPLANT
CHLORAPREP W/TINT 26 (MISCELLANEOUS) ×2 IMPLANT
COVER WAND RF STERILE (DRAPES) IMPLANT
DERMABOND ADVANCED (GAUZE/BANDAGES/DRESSINGS) ×1
DERMABOND ADVANCED .7 DNX12 (GAUZE/BANDAGES/DRESSINGS) ×1 IMPLANT
DRAIN PENROSE 5/8X18 LTX STRL (DRAIN) ×2 IMPLANT
DRAPE LAPAROTOMY 77X122 PED (DRAPES) ×2 IMPLANT
ELECT CAUTERY BLADE TIP 2.5 (TIP) ×2
ELECT REM PT RETURN 9FT ADLT (ELECTROSURGICAL) ×2
ELECTRODE CAUTERY BLDE TIP 2.5 (TIP) ×1 IMPLANT
ELECTRODE REM PT RTRN 9FT ADLT (ELECTROSURGICAL) ×1 IMPLANT
GLOVE BIO SURGEON STRL SZ 6.5 (GLOVE) ×6 IMPLANT
GLOVE INDICATOR 7.0 STRL GRN (GLOVE) ×6 IMPLANT
GOWN STRL REUS W/ TWL LRG LVL3 (GOWN DISPOSABLE) ×3 IMPLANT
GOWN STRL REUS W/TWL LRG LVL3 (GOWN DISPOSABLE) ×3
KIT TURNOVER KIT A (KITS) ×2 IMPLANT
LABEL OR SOLS (LABEL) ×2 IMPLANT
MESH PARIETEX PROGRIP RIGHT (Mesh General) ×2 IMPLANT
NEEDLE HYPO 22GX1.5 SAFETY (NEEDLE) ×2 IMPLANT
NS IRRIG 500ML POUR BTL (IV SOLUTION) ×2 IMPLANT
PACK BASIN MINOR ARMC (MISCELLANEOUS) ×2 IMPLANT
PLUG INGUINAL HERNIA MESH LG (Mesh General) ×2 IMPLANT
SPONGE KITTNER 5P (MISCELLANEOUS) ×2 IMPLANT
SPONGE LAP 18X18 RF (DISPOSABLE) ×2 IMPLANT
STRIP CLOSURE SKIN 1/2X4 (GAUZE/BANDAGES/DRESSINGS) ×2 IMPLANT
SUT ETHIBOND NAB MO 7 #0 18IN (SUTURE) ×2 IMPLANT
SUT MNCRL 4-0 (SUTURE) ×1
SUT MNCRL 4-0 27XMFL (SUTURE) ×1
SUT PROLENE 2 0 SH DA (SUTURE) IMPLANT
SUT VIC AB 2-0 SH 27 (SUTURE) ×1
SUT VIC AB 2-0 SH 27XBRD (SUTURE) ×1 IMPLANT
SUT VIC AB 3-0 SH 27 (SUTURE) ×1
SUT VIC AB 3-0 SH 27X BRD (SUTURE) ×1 IMPLANT
SUTURE MNCRL 4-0 27XMF (SUTURE) ×1 IMPLANT
SYR 10ML LL (SYRINGE) ×2 IMPLANT
SYR BULB IRRIG 60ML STRL (SYRINGE) ×2 IMPLANT

## 2018-11-12 NOTE — H&P (Signed)
HPI Maxwell Stanley is a 84 y.o. male.   I first saw him in August for a reducible right inguinal hernia.  He was scheduled for surgery on 25 September, however at his pre-op visit, he was found to be in a-fib.  He was seen by cardiology and underwent a thorough evaluation; ultimately he was cleared for surgery. His surgery has been rescheduled for today.  My initial consult note is copied here:   "He was referred by his primary care doctor, Dr. Halina Maidens, for evaluation of a right inguinal hernia.  [name] states that he has had 2 episodes of right groin pain each lasting about an hour.  The pain did not radiate and was brought on by activity.  He saw his primary care doctor who diagnosed him with a right inguinal hernia.  He is currently not having any pain.  He denies any burning with urination.  He does have BPH and is on Flomax so he does have some difficulty starting his stream.  He denies any obstipation, nausea, or vomiting.  He denies constipation or having to strain with bowel movements.  He says that the "knot" in his groin that was there when he saw Dr. [name] is no longer present.  He does work on [employer] and engages in a fair amount of lifting, but now has a lift table that he uses more regularly."  Past Medical History:  Diagnosis Date  . BPH (benign prostatic hypertrophy)    with BOO  . Cancer ([place])    skin cancers  . Complication of anesthesia   . Diabetes mellitus without complication ([place]) 0000000  . GERD (gastroesophageal reflux disease)   . Hypertension   . Nocturia   . Organic impotence   . PONV (postoperative nausea and vomiting)   . Urinary frequency    Past Surgical History:  Procedure Laterality Date  . ANKLE ARTHROSCOPY WITH OPEN REDUCTION INTERNAL FIXATION (ORIF) Left 1998  . HEMORRHOID SURGERY    . HERNIA REPAIR Left 0000000   umbilical and left inguinal  . KNEE ARTHROSCOPY Right 2006  . skin cancer resection Right 04/07/2018   ON hand.  Maxwell Stanley  THUMB ARTHROSCOPY Right 2013   tendon flap to joint   Family History  Problem Relation Age of Onset  . Diabetes Mother   . Coronary artery disease Father   . Hypertension Father   . Hyperlipidemia Father   . Leukemia Father    Social History   Tobacco Use  . Smoking status: Never Smoker  . Smokeless tobacco: Never Used  . Tobacco comment: smoking cessation materials not required  Substance Use Topics  . Alcohol use: No    Alcohol/week: 0.0 standard drinks  . Drug use: No   Current Meds  Medication Sig  . acetaminophen (TYLENOL) 500 MG tablet Take 1,000 mg by mouth 2 (two) times daily.   Maxwell Stanley amLODipine (NORVASC) 2.5 MG tablet TAKE 1 TABLET BY MOUTH EVERY DAY (Patient taking differently: Take 2.5 mg by mouth daily. )  . aspirin 81 MG tablet Take 81 mg by mouth 2 (two) times daily.   Maxwell Stanley atenolol (TENORMIN) 50 MG tablet TAKE 1 TABLET AT BEDTIME  . cholecalciferol (VITAMIN D) 1000 UNITS tablet Take 1,000 Units by mouth at bedtime.   . docusate sodium (COLACE) 100 MG capsule Take 200 mg by mouth daily.   Maxwell Stanley ELIQUIS 5 MG TABS tablet Take 5 mg by mouth 2 (two) times daily.  . finasteride (PROSCAR) 5 MG tablet  TAKE 1 TABLET DAILY  . fluorouracil (EFUDEX) 5 % cream Apply 1 application topically daily as needed. For a week  . fluticasone (FLONASE) 50 MCG/ACT nasal spray USE 2 SPRAYS IN EACH       NOSTRIL DAILY (Patient taking differently: Place 2 sprays into both nostrils daily as needed for allergies. )  . glucose blood (ONE TOUCH ULTRA TEST) test strip Use as instructed  . hydrochlorothiazide (MICROZIDE) 12.5 MG capsule TAKE 1 CAPSULE DAILY (IN   PLACE OF COMBINATION       TABLET) (Patient taking differently: Take 12.5 mg by mouth daily. )  . JANUVIA 100 MG tablet TAKE 1 TABLET DAILY (Patient taking differently: Take 100 mg by mouth daily. )  . losartan (COZAAR) 100 MG tablet Take 1 tablet (100 mg total) by mouth daily. (Patient taking differently: Take 50 mg by mouth 2 (two) times daily. )   . metFORMIN (GLUCOPHAGE-XR) 500 MG 24 hr tablet TAKE 1 TABLET TWICE A DAY (Patient taking differently: Take 1,000 mg by mouth at bedtime. )  . Misc Natural Products (OSTEO BI-FLEX TRIPLE STRENGTH) TABS Take 1 tablet by mouth 2 (two) times daily.   . Multiple Vitamins-Minerals (CENTRUM SILVER) tablet Take 0.5 tablets by mouth 2 (two) times daily.   Maxwell Stanley DELICA LANCETS [id number] MISC 1 each by Does not apply route 2 (two) times daily.  . pantoprazole (PROTONIX) 40 MG tablet TAKE 1 TABLET DAILY  . Probiotic Product (PROBIOTIC DAILY PO) Take 1 tablet by mouth daily.  . rosuvastatin (CRESTOR) 20 MG tablet TAKE 1 TABLET AT BEDTIME  . tamsulosin (FLOMAX) 0.4 MG CAPS capsule TAKE 1 CAPSULE DAILY AT 2PM (Patient taking differently: Take 0.4 mg by mouth daily after breakfast. )  . [DISCONTINUED] atenolol (TENORMIN) 50 MG tablet Take 1 tablet (50 mg total) by mouth at bedtime.  . [DISCONTINUED] finasteride (PROSCAR) 5 MG tablet Take 5 mg by mouth daily.  . [DISCONTINUED] pantoprazole (PROTONIX) 40 MG tablet Take 1 tablet (40 mg total) by mouth daily.  . [DISCONTINUED] rosuvastatin (CRESTOR) 20 MG tablet Take 1 tablet (20 mg total) by mouth at bedtime.   Allergies  Allergen Reactions  . Codeine Other (See Comments)    Made his "Wild"  . Ceftriaxone Rash  . Penicillin G Rash    Did it involve swelling of the face/tongue/throat, SOB, or low BP? No Did it involve sudden or severe rash/hives, skin peeling, or any reaction on the inside of your mouth or nose? No Did you need to seek medical attention at a hospital or doctor's office? No When did it last happen? If all above answers are "NO", may proceed with cephalosporin use.    Today's Vitals   11/12/18 0748  BP: (!) 145/93  Pulse: 72  Resp: 16  Temp: (!) 95.3 F (35.2 C)  TempSrc: Tympanic  SpO2: 100%  Weight: 86.2 kg  Height: 6\' 2"  (1.88 m)  PainSc: 0-No pain   Body mass index is 24.39 kg/m. Physical Exam  Constitutional: He is  oriented to person, place, and time. He appears well-developed and well-nourished. No distress.  HENT:  Head: Normocephalic and atraumatic.  Neck: No tracheal deviation present.  Pulmonary/Chest: Effort normal. No stridor. No respiratory distress.  Abdominal: Soft. A hernia is present. Hernia confirmed positive in the right inguinal area.  Genitourinary:     Musculoskeletal:        General: No edema.  Neurological: He is alert and oriented to person, place, and time.  Skin: Skin  is warm and dry.  Psychiatric: He has a normal mood and affect.    Assessment This is an 84 year old man with a reducible, but symptomatic right inguinal hernia.   Plan We will proceed with his hernia repair as scheduled. I have explained the procedure, risks, and aftercare of inguinal hernia repair to [name].   Risks include but are not limited to bleeding, infection, wound problems, anesthesia, recurrence, bladder or intestine injury, urinary retention, testicular dysfunction, chronic pain, mesh problems.  He  seems to understand and agrees to proceed.  Questions were answered to his stated satisfaction.

## 2018-11-12 NOTE — Anesthesia Post-op Follow-up Note (Signed)
Anesthesia QCDR form completed.        

## 2018-11-12 NOTE — Anesthesia Postprocedure Evaluation (Signed)
Anesthesia Post Note  Patient: Maxwell Stanley  Procedure(s) Performed: HERNIA REPAIR INGUINAL ADULT (Right )  Patient location during evaluation: PACU Anesthesia Type: General Level of consciousness: awake and alert Pain management: pain level controlled Vital Signs Assessment: post-procedure vital signs reviewed and stable Respiratory status: spontaneous breathing, nonlabored ventilation, respiratory function stable and patient connected to nasal cannula oxygen Cardiovascular status: blood pressure returned to baseline and stable Postop Assessment: no apparent nausea or vomiting Anesthetic complications: no     Last Vitals:  Vitals:   11/12/18 1136 11/12/18 1231  BP: 121/76 (!) 141/75  Pulse: 73 67  Resp: 14 16  Temp: 36.6 C   SpO2: 94% 96%    Last Pain:  Vitals:   11/12/18 1231  TempSrc:   PainSc: 0-No pain                 Martha Clan

## 2018-11-12 NOTE — Discharge Instructions (Signed)

## 2018-11-12 NOTE — Transfer of Care (Signed)
Immediate Anesthesia Transfer of Care Note  Patient: Maxwell Stanley  Procedure(s) Performed: HERNIA REPAIR INGUINAL ADULT (Right )  Patient Location: PACU  Anesthesia Type:General  Level of Consciousness: awake, alert  and oriented  Airway & Oxygen Therapy: Patient Spontanous Breathing  Post-op Assessment: Report given to RN and Post -op Vital signs reviewed and stable  Post vital signs: Reviewed and stable  Last Vitals:  Vitals Value Taken Time  BP 103/62 11/12/18 1039  Temp 36.3 C 11/12/18 1039  Pulse 66 11/12/18 1047  Resp 11 11/12/18 1047  SpO2 100 % 11/12/18 1047  Vitals shown include unvalidated device data.  Last Pain:  Vitals:   11/12/18 1039  TempSrc:   PainSc: Asleep         Complications: No apparent anesthesia complications

## 2018-11-12 NOTE — Op Note (Signed)
Hernia, Open, Procedure Note  Indications: The patient presented with a history of a right, reducible hernia.    Pre-operative Diagnosis: right reducible inguinal hernia  Post-operative Diagnosis: same; pantaloon hernia  Surgeon: Fredirick Maudlin   Assistants: none  Anesthesia: General LMA anesthesia  ASA Class: 2  Procedure Details  The patient was seen again in the Holding Room. The risks, benefits, complications, treatment options, and expected outcomes were discussed with the patient. The possibilities of reaction to medication, pulmonary aspiration, perforation of viscus, bleeding, recurrent infection, the need for additional procedures, and development of a complication requiring transfusion or further operation were discussed with the patient and/or family. There was concurrence with the proposed plan, and informed consent was obtained. The site of surgery was properly noted/marked. The patient was taken to the Operating Room, identified as Maxwell Stanley, and the procedure verified as hernia repair. A Time Out was held and the above information confirmed.  The patient was placed in the supine position and underwent induction of anesthesia, the lower abdomen and groin was prepped and draped in the standard fashion, and a one-to-one mixture of 0.25% bupivacaine and 1% lidocaine with epinephrine was used to anesthetize the skin over the mid-portion of the inguinal canal. A transverse incision was made. Dissection was carried through the soft tissue to expose the inguinal canal and inguinal ligament along its lower edge. The external oblique fascia was split along the course of its fibers, exposing the inguinal canal. The cord and nerve were looped using a Penrose drain and reflected out of the field.  Upon exploration, it became apparent that this was a pantaloon hernia with both a direct and indirect defect.  The indirect sac was dissected off of the cord structures, stripping away  cremasteric muscle fibers until the internal canal was identified.  The sac was opened.  There was a small cord lipoma that was dissected off and handed off as a specimen.  The sac was then closed with a pursestring suture and reduced back into the abdomen.  The indirect defect was similarly handled.  A large mesh plug was placed in the indirect defect.  A right sided progrip Velcro mesh was then tacked to the pubic tubercle with a 0 Ethibond suture.  It was carefully placed along the shelving edge of the inguinal ligament and the conjoined tendon.  The cord was brought through the prefabricated opening.  The Velcro limbs were then connected to each other.  The opening for the cord was small, admitting just a fingertip.  The cord contents were returned to the inguinal canal. The external oblique fascia was then closed in a continuous fashion using 2-0 Vicryl suture taking care not to cause entrapment.  Liposomal bupivacaine was infused along the fascial planes.  Scarpa's fascia was closed with interrupted 3-0 Vicryl.  Interrupted deep dermal sutures were placed and the skin was closed with running subcuticular Monocryl.  Skin was cleaned and Dermabond and Steri-Strips were applied.  The patient was then awakened, extubated, and taken to the postanesthesia care unit in good condition.  Instrument, sponge, and needle counts were correct prior to closure and at the conclusion of the case.  Findings: Hernia as above  Estimated Blood Loss: Minimal         Drains: None         Total IV Fluids: See anesthesia record          Specimens: Cord lipoma  Complications: None; patient tolerated the procedure well.         Disposition: PACU - hemodynamically stable.         Condition: stable

## 2018-11-12 NOTE — Anesthesia Preprocedure Evaluation (Signed)
Anesthesia Evaluation  Patient identified by MRN, date of birth, ID band Patient awake    Reviewed: Allergy & Precautions, H&P , NPO status , Patient's Chart, lab work & pertinent test results, reviewed documented beta blocker date and time   History of Anesthesia Complications (+) PONV and history of anesthetic complications  Airway Mallampati: I  TM Distance: >3 FB Neck ROM: full    Dental  (+) Caps, Dental Advidsory Given, Teeth Intact, Missing   Pulmonary neg pulmonary ROS,    Pulmonary exam normal        Cardiovascular Exercise Tolerance: Good hypertension, (-) angina(-) Past MI and (-) Cardiac Stents Normal cardiovascular exam+ dysrhythmias Atrial Fibrillation (-) Valvular Problems/Murmurs     Neuro/Psych negative neurological ROS  negative psych ROS   GI/Hepatic Neg liver ROS, GERD  ,  Endo/Other  diabetes  Renal/GU CRFRenal disease  negative genitourinary   Musculoskeletal   Abdominal   Peds  Hematology negative hematology ROS (+)   Anesthesia Other Findings Past Medical History: No date: BPH (benign prostatic hypertrophy)     Comment:  with BOO No date: Cancer ([place])     Comment:  skin cancers No date: Complication of anesthesia 2017: Diabetes mellitus without complication ([facility]) No date: GERD (gastroesophageal reflux disease) No date: Hypertension No date: Nocturia 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 No date: PONV (postoperative nausea and vomiting) No date: Urinary frequency   Reproductive/Obstetrics negative OB ROS                             Anesthesia Physical Anesthesia Plan  ASA: II  Anesthesia Plan: General   Post-op Pain Management:    Induction: Intravenous  PONV Risk Score and Plan: 3 and Ondansetron, Dexamethasone and Treatment may vary due to age or medical condition  Airway Management Planned: LMA and Oral ETT  Additional Equipment:   Intra-op Plan:    Post-operative Plan: Extubation in OR  Informed Consent: I have reviewed the patients History and Physical, chart, labs and discussed the procedure including the risks, benefits and alternatives for the proposed anesthesia with the patient or authorized representative who has indicated his/her understanding and acceptance.     Dental Advisory Given  Plan Discussed with: Anesthesiologist, CRNA and Surgeon  Anesthesia Plan Comments:         Anesthesia Quick Evaluation

## 2018-11-12 NOTE — Anesthesia Procedure Notes (Signed)
Procedure Name: LMA Insertion Date/Time: 11/12/2018 9:37 AM Performed by: Marsh Dolly, CRNA Pre-anesthesia Checklist: Patient identified, Patient being monitored, Timeout performed, Emergency Drugs available and Suction available Patient Re-evaluated:Patient Re-evaluated prior to induction Oxygen Delivery Method: Circle system utilized Preoxygenation: Pre-oxygenation with 100% oxygen Induction Type: IV induction Ventilation: Mask ventilation without difficulty LMA: LMA inserted LMA Size: 5.0 Tube type: Oral Number of attempts: 1 Placement Confirmation: positive ETCO2 and breath sounds checked- equal and bilateral Tube secured with: Tape Dental Injury: Teeth and Oropharynx as per pre-operative assessment

## 2018-11-15 ENCOUNTER — Encounter: Payer: Self-pay | Admitting: General Surgery

## 2018-11-15 LAB — SURGICAL PATHOLOGY

## 2018-11-22 ENCOUNTER — Ambulatory Visit (INDEPENDENT_AMBULATORY_CARE_PROVIDER_SITE_OTHER): Payer: Medicare HMO

## 2018-11-22 VITALS — BP 104/60 | HR 80 | Temp 97.5°F | Ht 74.0 in | Wt 187.5 lb

## 2018-11-22 DIAGNOSIS — Z859 Personal history of malignant neoplasm, unspecified: Secondary | ICD-10-CM | POA: Diagnosis not present

## 2018-11-22 DIAGNOSIS — Z872 Personal history of diseases of the skin and subcutaneous tissue: Secondary | ICD-10-CM | POA: Diagnosis not present

## 2018-11-22 DIAGNOSIS — D485 Neoplasm of uncertain behavior of skin: Secondary | ICD-10-CM | POA: Diagnosis not present

## 2018-11-22 DIAGNOSIS — C44222 Squamous cell carcinoma of skin of right ear and external auricular canal: Secondary | ICD-10-CM | POA: Diagnosis not present

## 2018-11-22 DIAGNOSIS — Z85828 Personal history of other malignant neoplasm of skin: Secondary | ICD-10-CM | POA: Diagnosis not present

## 2018-11-22 DIAGNOSIS — B078 Other viral warts: Secondary | ICD-10-CM | POA: Diagnosis not present

## 2018-11-22 DIAGNOSIS — L578 Other skin changes due to chronic exposure to nonionizing radiation: Secondary | ICD-10-CM | POA: Diagnosis not present

## 2018-11-22 DIAGNOSIS — Z0000 Encounter for general adult medical examination without abnormal findings: Secondary | ICD-10-CM | POA: Diagnosis not present

## 2018-11-22 DIAGNOSIS — L570 Actinic keratosis: Secondary | ICD-10-CM | POA: Diagnosis not present

## 2018-11-22 NOTE — Patient Instructions (Signed)
Maxwell Stanley , Thank you for taking time to come for your Medicare Wellness Visit. I appreciate your ongoing commitment to your health goals. Please review the following plan we discussed and let me know if I can assist you in the future.   Screening recommendations/referrals: Colonoscopy: no longer required Recommended yearly ophthalmology/optometry visit for glaucoma screening and checkup Recommended yearly dental visit for hygiene and checkup  Vaccinations: Influenza vaccine: done 09/16/18 Pneumococcal vaccine: done 10/26/13 Tdap vaccine: done 10/03/10 Shingles vaccine: Shingrix completed 10/03/10    Conditions/risks identified: Keep up the great work!  Next appointment: Please follow up in one year for your Medicare Annual Wellness visit.    Preventive Care 20 Years and Older, Male Preventive care refers to lifestyle choices and visits with your health care provider that can promote health and wellness. What does preventive care include?  A yearly physical exam. This is also called an annual well check.  Dental exams once or twice a year.  Routine eye exams. Ask your health care provider how often you should have your eyes checked.  Personal lifestyle choices, including:  Daily care of your teeth and gums.  Regular physical activity.  Eating a healthy diet.  Avoiding tobacco and drug use.  Limiting alcohol use.  Practicing safe sex.  Taking low doses of aspirin every day.  Taking vitamin and mineral supplements as recommended by your health care provider. What happens during an annual well check? The services and screenings done by your health care provider during your annual well check will depend on your age, overall health, lifestyle risk factors, and family history of disease. Counseling  Your health care provider may ask you questions about your:  Alcohol use.  Tobacco use.  Drug use.  Emotional well-being.  Home and relationship well-being.  Sexual  activity.  Eating habits.  History of falls.  Memory and ability to understand (cognition).  Work and work Statistician. Screening  You may have the following tests or measurements:  Height, weight, and BMI.  Blood pressure.  Lipid and cholesterol levels. These may be checked every 5 years, or more frequently if you are over 1 years old.  Skin check.  Lung cancer screening. You may have this screening every year starting at age 54 if you have a 30-pack-year history of smoking and currently smoke or have quit within the past 15 years.  Fecal occult blood test (FOBT) of the stool. You may have this test every year starting at age 10.  Flexible sigmoidoscopy or colonoscopy. You may have a sigmoidoscopy every 5 years or a colonoscopy every 10 years starting at age 36.  Prostate cancer screening. Recommendations will vary depending on your family history and other risks.  Hepatitis C blood test.  Hepatitis B blood test.  Sexually transmitted disease (STD) testing.  Diabetes screening. This is done by checking your blood sugar (glucose) after you have not eaten for a while (fasting). You may have this done every 1-3 years.  Abdominal aortic aneurysm (AAA) screening. You may need this if you are a current or former smoker.  Osteoporosis. You may be screened starting at age 25 if you are at high risk. Talk with your health care provider about your test results, treatment options, and if necessary, the need for more tests. Vaccines  Your health care provider may recommend certain vaccines, such as:  Influenza vaccine. This is recommended every year.  Tetanus, diphtheria, and acellular pertussis (Tdap, Td) vaccine. You may need a Td booster every  10 years.  Zoster vaccine. You may need this after age 61.  Pneumococcal 13-valent conjugate (PCV13) vaccine. One dose is recommended after age 66.  Pneumococcal polysaccharide (PPSV23) vaccine. One dose is recommended after age 88.  Talk to your health care provider about which screenings and vaccines you need and how often you need them. This information is not intended to replace advice given to you by your health care provider. Make sure you discuss any questions you have with your health care provider. Document Released: 01/19/2015 Document Revised: 09/12/2015 Document Reviewed: 10/24/2014 Elsevier Interactive Patient Education  2017 [place] Prevention in the Home Falls can cause injuries. They can happen to people of all ages. There are many things you can do to make your home safe and to help prevent falls. What can I do on the outside of my home?  Regularly fix the edges of walkways and driveways and fix any cracks.  Remove anything that might make you trip as you walk through a door, such as a raised step or threshold.  Trim any bushes or trees on the path to your home.  Use bright outdoor lighting.  Clear any walking paths of anything that might make someone trip, such as rocks or tools.  Regularly check to see if handrails are loose or broken. Make sure that both sides of any steps have handrails.  Any raised decks and porches should have guardrails on the edges.  Have any leaves, snow, or ice cleared regularly.  Use sand or salt on walking paths during winter.  Clean up any spills in your garage right away. This includes oil or grease spills. What can I do in the bathroom?  Use night lights.  Install grab bars by the toilet and in the tub and shower. Do not use towel bars as grab bars.  Use non-skid mats or decals in the tub or shower.  If you need to sit down in the shower, use a plastic, non-slip stool.  Keep the floor dry. Clean up any water that spills on the floor as soon as it happens.  Remove soap buildup in the tub or shower regularly.  Attach bath mats securely with double-sided non-slip rug tape.  Do not have throw rugs and other things on the floor that can make you  trip. What can I do in the bedroom?  Use night lights.  Make sure that you have a light by your bed that is easy to reach.  Do not use any sheets or blankets that are too big for your bed. They should not hang down onto the floor.  Have a firm chair that has side arms. You can use this for support while you get dressed.  Do not have throw rugs and other things on the floor that can make you trip. What can I do in the kitchen?  Clean up any spills right away.  Avoid walking on wet floors.  Keep items that you use a lot in easy-to-reach places.  If you need to reach something above you, use a strong step stool that has a grab bar.  Keep electrical cords out of the way.  Do not use floor polish or wax that makes floors slippery. If you must use wax, use non-skid floor wax.  Do not have throw rugs and other things on the floor that can make you trip. What can I do with my stairs?  Do not leave any items on the stairs.  Make sure  that there are handrails on both sides of the stairs and use them. Fix handrails that are broken or loose. Make sure that handrails are as long as the stairways.  Check any carpeting to make sure that it is firmly attached to the stairs. Fix any carpet that is loose or worn.  Avoid having throw rugs at the top or bottom of the stairs. If you do have throw rugs, attach them to the floor with carpet tape.  Make sure that you have a light switch at the top of the stairs and the bottom of the stairs. If you do not have them, ask someone to add them for you. What else can I do to help prevent falls?  Wear shoes that:  Do not have high heels.  Have rubber bottoms.  Are comfortable and fit you well.  Are closed at the toe. Do not wear sandals.  If you use a stepladder:  Make sure that it is fully opened. Do not climb a closed stepladder.  Make sure that both sides of the stepladder are locked into place.  Ask someone to hold it for you, if  possible.  Clearly mark and make sure that you can see:  Any grab bars or handrails.  First and last steps.  Where the edge of each step is.  Use tools that help you move around (mobility aids) if they are needed. These include:  Canes.  Walkers.  Scooters.  Crutches.  Turn on the lights when you go into a dark area. Replace any light bulbs as soon as they burn out.  Set up your furniture so you have a clear path. Avoid moving your furniture around.  If any of your floors are uneven, fix them.  If there are any pets around you, be aware of where they are.  Review your medicines with your doctor. Some medicines can make you feel dizzy. This can increase your chance of falling. Ask your doctor what other things that you can do to help prevent falls. This information is not intended to replace advice given to you by your health care provider. Make sure you discuss any questions you have with your health care provider. Document Released: 10/19/2008 Document Revised: 05/31/2015 Document Reviewed: 01/27/2014 Elsevier Interactive Patient Education  2017 [employer].

## 2018-11-22 NOTE — Progress Notes (Signed)
Subjective:   Maxwell Stanley is a 84 y.o. male who presents for Medicare Annual/Subsequent preventive examination.  Virtual Visit via Telephone Note  I connected with Maxwell Stanley on 11/22/18 at  3:20 PM EST by telephone and verified that I am speaking with the correct person using two identifiers.  Medicare Annual Wellness visit completed telephonically due to Covid-19 pandemic.   Location: Patient: home Provider: office   I discussed the limitations, risks, security and privacy concerns of performing an evaluation and management service by telephone and the availability of in person appointments. The patient expressed understanding and agreed to proceed.  Some vital signs may be absent or patient reported.   Maxwell Marker, LPN    Review of Systems:    Cardiac Risk Factors include: advanced age (>63men, >48 women);diabetes mellitus;dyslipidemia;male gender;hypertension     Objective:    Vitals: BP 104/60   Pulse 80   Temp (!) 97.5 F (36.4 C)   Ht 6\' 2"  (1.88 m)   Wt 187 lb 8 oz (85 kg)   BMI 24.07 kg/m   Body mass index is 24.07 kg/m.  Advanced Directives 11/22/2018 11/12/2018 08/16/2018 09/09/2017 08/25/2016 08/13/2015 08/13/2015  Does Patient Have a Medical Advance Directive? Yes Yes No Yes Yes Yes Yes  Type of Paramedic of [place];Living will Living will - [place];Living will [place];Living will [place];Living will [place];Living will  Does patient want to make changes to medical advance directive? - No - Patient declined - - - - -  Copy of [place] in Chart? Yes - validated most recent copy scanned in chart (See row information) - - No - copy requested No - copy requested - -  Would patient like information on creating a medical advance directive? - No - Patient declined - - - - -    Tobacco Social History   Tobacco Use  Smoking  Status Never Smoker  Smokeless Tobacco Never Used  Tobacco Comment   smoking cessation materials not required     Counseling given: Not Answered Comment: smoking cessation materials not required   Clinical Intake:  Pre-visit preparation completed: Yes  Pain : No/denies pain     BMI - recorded: 24.07 Nutritional Status: BMI of 19-24  Normal Nutritional Risks: None Diabetes: Yes CBG done?: No Did pt. bring in CBG monitor from home?: No    Nutrition Risk Assessment:  Has the patient had any N/V/D within the last 2 months?  No  Does the patient have any non-healing wounds?  No  Has the patient had any unintentional weight loss or weight gain?  No   Diabetes:  Is the patient diabetic?  Yes  If diabetic, was a CBG obtained today?  No  Did the patient bring in their glucometer from home?  No  How often do you monitor your CBG's? daily.   Financial Strains and Diabetes Management:  Are you having any financial strains with the device, your supplies or your medication? No .  Does the patient want to be seen by Chronic Care Management for management of their diabetes?  No  Would the patient like to be referred to a Nutritionist or for Diabetic Management?  No   Diabetic Exams:  Diabetic Eye Exam: Completed 09/02/17. Overdue for diabetic eye exam. Pt has been advised about the importance in completing this exam. Pt's appt postponed due to Covid.  Diabetic Foot Exam: Completed  08/26/18.   How often do you need to have someone help you when you read instructions, pamphlets, or other written materials from your doctor or pharmacy?: 1 - Never  Interpreter Needed?: No  Information entered by :: Maxwell Marker LPN  Past Medical History:  Diagnosis Date  . A-fib ([place])   . BPH (benign prostatic hypertrophy)    with BOO  . Cancer ([place])    skin cancers  . Complication of anesthesia   . Diabetes mellitus without complication ([place]) 0000000  . GERD (gastroesophageal reflux disease)    . Hypertension   . Nocturia   . Organic impotence   . PONV (postoperative nausea and vomiting)   . Urinary frequency    Past Surgical History:  Procedure Laterality Date  . ANKLE ARTHROSCOPY WITH OPEN REDUCTION INTERNAL FIXATION (ORIF) Left 1998  . HEMORRHOID SURGERY    . HERNIA REPAIR Left 0000000   umbilical and left inguinal  . INGUINAL HERNIA REPAIR Right 11/12/2018   Procedure: HERNIA REPAIR INGUINAL ADULT;  Surgeon: Fredirick Maudlin, MD;  Location: [facility] ORS;  Service: General;  Laterality: Right;  . KNEE ARTHROSCOPY Right 2006  . skin cancer resection Right 04/07/2018   ON hand.  [name] THUMB ARTHROSCOPY Right 2013   tendon flap to joint   Family History  Problem Relation Age of Onset  . Diabetes Mother   . Coronary artery disease Father   . Hypertension Father   . Hyperlipidemia Father   . Leukemia Father    Social History   Socioeconomic History  . Marital status: Married    Spouse name: Maxwell Stanley  . Number of children: 2  . Years of education: some college  . Highest education level: 12th grade  Occupational History  . Occupation: Retired  Scientific laboratory technician  . Financial resource strain: Not hard at all  . Food insecurity    Worry: Never true    Inability: Never true  . Transportation needs    Medical: No    Non-medical: No  Tobacco Use  . Smoking status: Never Smoker  . Smokeless tobacco: Never Used  . Tobacco comment: smoking cessation materials not required  Substance and Sexual Activity  . Alcohol use: No    Alcohol/week: 0.0 standard drinks  . Drug use: No  . Sexual activity: Not Currently  Lifestyle  . Physical activity    Days per week: 0 days    Minutes per session: 0 min  . Stress: Not at all  Relationships  . Social Herbalist on phone: Patient refused    Gets together: Patient refused    Attends religious service: Patient refused    Active member of club or organization: Patient refused    Attends meetings of clubs or organizations:  Patient refused    Relationship status: Married  Other Topics Concern  . Not on file  Social History Narrative   Caffeine in moderation. Pt stays active working on [employer] and mows 3 yards per week    Outpatient Encounter Medications as of 11/22/2018  Medication Sig  . acetaminophen (TYLENOL) 500 MG tablet Take 1,000 mg by mouth 2 (two) times daily.   [name] amLODipine (NORVASC) 2.5 MG tablet TAKE 1 TABLET BY MOUTH EVERY DAY (Patient taking differently: Take 2.5 mg by mouth daily. )  . aspirin 81 MG tablet Take 81 mg by mouth 2 (two) times daily.   [name] atenolol (TENORMIN) 50 MG tablet TAKE 1 TABLET AT BEDTIME  . cholecalciferol (VITAMIN D) 1000  UNITS tablet Take 1,000 Units by mouth at bedtime.   . docusate sodium (COLACE) 100 MG capsule Take 200 mg by mouth daily.   [name] ELIQUIS 5 MG TABS tablet Take 5 mg by mouth 2 (two) times daily.  . finasteride (PROSCAR) 5 MG tablet TAKE 1 TABLET DAILY  . fluorouracil (EFUDEX) 5 % cream Apply 1 application topically daily as needed. For a week  . fluticasone (FLONASE) 50 MCG/ACT nasal spray USE 2 SPRAYS IN EACH       NOSTRIL DAILY (Patient taking differently: Place 2 sprays into both nostrils daily as needed for allergies. )  . glucose blood (ONE TOUCH ULTRA TEST) test strip Use as instructed  . hydrochlorothiazide (MICROZIDE) 12.5 MG capsule TAKE 1 CAPSULE DAILY (IN   PLACE OF COMBINATION       TABLET) (Patient taking differently: Take 12.5 mg by mouth daily. )  . ibuprofen (ADVIL) 800 MG tablet Take 1 tablet (800 mg total) by mouth every 8 (eight) hours as needed.  [name] JANUVIA 100 MG tablet TAKE 1 TABLET DAILY (Patient taking differently: Take 100 mg by mouth daily. )  . losartan (COZAAR) 100 MG tablet Take 1 tablet (100 mg total) by mouth daily. (Patient taking differently: Take 50 mg by mouth 2 (two) times daily. )  . metFORMIN (GLUCOPHAGE-XR) 500 MG 24 hr tablet TAKE 1 TABLET TWICE A DAY (Patient taking differently: Take 1,000 mg by mouth at bedtime. )   . Misc Natural Products (OSTEO BI-FLEX TRIPLE STRENGTH) TABS Take 1 tablet by mouth 2 (two) times daily.   . Multiple Vitamins-Minerals (CENTRUM SILVER) tablet Take 0.5 tablets by mouth 2 (two) times daily.   [name] DELICA LANCETS 99991111 MISC 1 each by Does not apply route 2 (two) times daily.  . pantoprazole (PROTONIX) 40 MG tablet TAKE 1 TABLET DAILY  . Probiotic Product (PROBIOTIC DAILY PO) Take 1 tablet by mouth daily.  . rosuvastatin (CRESTOR) 20 MG tablet TAKE 1 TABLET AT BEDTIME  . tamsulosin (FLOMAX) 0.4 MG CAPS capsule TAKE 1 CAPSULE DAILY AT 2PM (Patient taking differently: Take 0.4 mg by mouth daily after breakfast. )  . traMADol (ULTRAM) 50 MG tablet Take 1 tablet (50 mg total) by mouth every 6 (six) hours as needed. (Patient not taking: Reported on 11/22/2018)   No facility-administered encounter medications on file as of 11/22/2018.     Activities of Daily Living In your present state of health, do you have any difficulty performing the following activities: 11/22/2018 09/28/2018  Hearing? N N  Comment declines hearing aids -  Vision? N N  Difficulty concentrating or making decisions? N N  Walking or climbing stairs? N Y  Comment - due to knee pain  Dressing or bathing? N N  Doing errands, shopping? N N  Preparing Food and eating ? N -  Using the Toilet? N -  In the past six months, have you accidently leaked urine? N -  Do you have problems with loss of bowel control? N -  Managing your Medications? N -  Managing your Finances? N -  Housekeeping or managing your Housekeeping? N -  Some recent data might be hidden    Patient Care Team: Glean Hess, MD as PCP - General (Internal Medicine) Jannet Mantis, MD as Consulting Physician (Dermatology) Leanor Kail, MD (Inactive) as Consulting Physician (Orthopedic Surgery) Baker Pierini, OD as Consulting Physician (Optometry) Reche Dixon, PA-C as Consulting Physician (Orthopedic Surgery) Murrell Redden, MD (Urology)  Assessment:   This is a routine wellness examination for Gwyndolyn Saxon.  Exercise Activities and Dietary recommendations Current Exercise Habits: The patient has a physically strenuous job, but has no regular exercise apart from work., Exercise limited by: None identified  Goals    . DIET - INCREASE WATER INTAKE     Recommend to drink at least 6-8 8oz glasses of water per day.    . Weight (lb) < 200 lb (90.7 kg)     Wants  To lose some weight after gaining over the summer.        Fall Risk Fall Risk  11/22/2018 09/14/2018 08/10/2018 04/21/2018 09/09/2017  Falls in the past year? 0 0 0 0 No  Number falls in past yr: 0 - - 0 -  Injury with Fall? 0 - - 0 -  Risk for fall due to : - - - - Impaired vision  Risk for fall due to: Comment - - - - wears eyeglasses  Follow up Falls prevention discussed - Falls evaluation completed Falls evaluation completed;Falls prevention discussed -   FALL RISK PREVENTION PERTAINING TO THE HOME:  Any stairs in or around the home? Yes  If so, do they handrails? Yes   Home free of loose throw rugs in walkways, pet beds, electrical cords, etc? Yes  Adequate lighting in your home to reduce risk of falls? Yes   ASSISTIVE DEVICES UTILIZED TO PREVENT FALLS:  Life alert? No  Use of a cane, walker or w/c? No  Grab bars in the bathroom? Yes  Shower chair or bench in shower? Yes  Elevated toilet seat or a handicapped toilet? Yes   DME ORDERS:  DME order needed?  No   TIMED UP AND GO:  Was the test performed? No . Telephonic visit.   Education: Fall risk prevention has been discussed.  Intervention(s) required? No   Depression Screen PHQ 2/9 Scores 11/22/2018 08/26/2018 07/28/2018 04/21/2018  PHQ - 2 Score 0 0 0 0  PHQ- 9 Score - 0 0 0    Cognitive Function MMSE - Mini Mental State Exam 08/13/2015  Orientation to time 5  Orientation to Place 5  Registration 3  Attention/ Calculation 5  Recall 3  Language- name 2 objects 2     6CIT  Screen 11/22/2018 09/09/2017 08/25/2016  What Year? 0 points 0 points 0 points  What month? 0 points 0 points 0 points  What time? 0 points 0 points 0 points  Count back from 20 0 points 0 points 0 points  Months in reverse 0 points 0 points 0 points  Repeat phrase 0 points 0 points 0 points  Total Score 0 0 0    Immunization History  Administered Date(s) Administered  . Fluad Quad(high Dose 65+) 09/16/2018  . Influenza, High Dose Seasonal PF 10/13/2016, 09/09/2017  . Influenza,inj,Quad PF,6+ Mos 09/26/2014  . Influenza-Unspecified 09/26/2014, 10/13/2016  . Pneumococcal Conjugate-13 10/26/2013  . Pneumococcal Polysaccharide-23 01/31/2011  . Tdap 10/03/2010  . Zoster 04/16/2000  . Zoster Recombinat (Shingrix) 08/12/2017, 10/22/2017    Qualifies for Shingles Vaccine? Yes  Shingrix series completed.   Tdap: Up to date  Flu Vaccine: Up to date  Pneumococcal Vaccine: Up to date   Screening Tests Health Maintenance  Topic Date Due  . OPHTHALMOLOGY EXAM  09/03/2018  . HEMOGLOBIN A1C  02/26/2019  . FOOT EXAM  08/26/2019  . TETANUS/TDAP  10/02/2020  . INFLUENZA VACCINE  Completed  . PNA vac Low Risk Adult  Completed   Cancer  Screenings:  Colorectal Screening: Completed 2012.  No longer required.   Lung Cancer Screening: (Low Dose CT Chest recommended if Age 33-80 years, 30 pack-year currently smoking OR have quit w/in 15years.) does not qualify.   Additional Screening:  Hepatitis C Screening: no longer required   Vision Screening: Recommended annual ophthalmology exams for early detection of glaucoma and other disorders of the eye. Is the patient up to date with their annual eye exam?  No  - postponed due to Covid Who is the provider or what is the name of the office in which the pt attends annual eye exams? Dr. Mallie Mussel  Dental Screening: Recommended annual dental exams for proper oral hygiene  Community Resource Referral:  CRR required this visit?  No       Plan:     I have personally reviewed and addressed the Medicare Annual Wellness questionnaire and have noted the following in the patient's chart:  A. Medical and social history B. Use of alcohol, tobacco or illicit drugs  C. Current medications and supplements D. Functional ability and status E.  Nutritional status F.  Physical activity G. Advance directives H. List of other physicians I.  Hospitalizations, surgeries, and ER visits in previous 12 months J.  [place] such as hearing and vision if needed, cognitive and depression L. Referrals and appointments   In addition, I have reviewed and discussed with patient certain preventive protocols, quality metrics, and best practice recommendations. A written personalized care plan for preventive services as well as general preventive health recommendations were provided to patient.   Signed,  Maxwell Marker, LPN Nurse Health Advisor   Nurse Notes:

## 2018-11-30 ENCOUNTER — Ambulatory Visit (INDEPENDENT_AMBULATORY_CARE_PROVIDER_SITE_OTHER): Payer: Medicare HMO | Admitting: General Surgery

## 2018-11-30 ENCOUNTER — Other Ambulatory Visit: Payer: Self-pay

## 2018-11-30 ENCOUNTER — Encounter: Payer: Self-pay | Admitting: General Surgery

## 2018-11-30 VITALS — BP 152/93 | HR 84 | Temp 97.9°F | Resp 16 | Ht 74.0 in | Wt 194.4 lb

## 2018-11-30 DIAGNOSIS — Z8719 Personal history of other diseases of the digestive system: Secondary | ICD-10-CM

## 2018-11-30 DIAGNOSIS — Z9889 Other specified postprocedural states: Secondary | ICD-10-CM

## 2018-11-30 NOTE — Progress Notes (Signed)
Maxwell Stanley is here today for a postoperative visit.  He is an 84 year old man who underwent an open right inguinal hernia repair on November 12, 2018.  He states that he has done well in the postoperative period.  He denies any urinary symptoms, diarrhea, or constipation.  No nausea or vomiting.  No fevers or chills.  He is a little bit concerned that there seems to be some bulging at the site of the surgical repair.  He also had a couple of episodes of a burning sensation down into his groin area, but these have essentially resolved.  Today's Vitals   11/30/18 1120  BP: (!) 152/93  Pulse: 84  Resp: 16  Temp: 97.9 F (36.6 C)  TempSrc: Temporal  SpO2: 97%  Weight: 194 lb 6.4 oz (88.2 kg)  Height: 6\' 2"  (1.88 m)  PainSc: 0-No pain   Body mass index is 24.96 kg/m. Focused examination: His hernia incision site is well approximated.  There is an expected amount of postoperative swelling present.  There is a healing ridge present.  With Valsalva, there is no evidence of recurrent hernia.  Impression and plan: This is an 84 year old man who had an open inguinal hernia repair for what proved to be a pantaloon hernia on the right.  He is doing well.  He was reassured that the bulge represents normal healing and should resolve as the swelling and inflammation go down.  The scar will continue to mature and I expect that the site will be flat at that time.  He was instructed to contact us if he has any further issues with burning or shooting pain from the incision site down into his groin.  This could potentially represent nerve impingement; this can be treated with in office Kenalog injections or trial of scheduled anti-inflammatory medications.  For now, we have not made a scheduled follow-up visit.  He may resume all of his usual activities, with the caveat that he should continue to refrain from lifting anything heavier than 10 pounds for an additional 4 weeks.

## 2018-11-30 NOTE — Patient Instructions (Addendum)
If you have constant burning or shooting pain down to the groin area. Please give our office a call.   Patient is to refrain from heavy lifting of 10 lbs or more for four more weeks.   Laparoscopic Inguinal Hernia Repair, Adult, Care After This sheet gives you information about how to care for yourself after your procedure. Your health care provider may also give you more specific instructions. If you have problems or questions, contact your health care provider. What can I expect after the procedure? After the procedure, it is common to have:  Pain.  Swelling and bruising around the incision area.  Scrotal swelling, in men.  Some fluid or blood draining from your incisions. Follow these instructions at home: Incision care  Follow instructions from your health care provider about how to take care of your incisions. Make sure you: ? Wash your hands with soap and water before you change your bandage (dressing). If soap and water are not available, use hand sanitizer. ? Change your dressing as told by your health care provider. ? Leave stitches (sutures), skin glue, or adhesive strips in place. These skin closures may need to stay in place for 2 weeks or longer. If adhesive strip edges start to loosen and curl up, you may trim the loose edges. Do not remove adhesive strips completely unless your health care provider tells you to do that.  Check your incision area every day for signs of infection. Check for: ? More redness, swelling, or pain. ? More fluid or blood. ? Warmth. ? Pus or a bad smell.  Wear loose, soft clothing while your incisions heal. Driving  Do not drive or use heavy machinery while taking prescription pain medicine.  Do not drive for 24 hours if you were given a medicine to help you relax (sedative) during your procedure. Activity  Do not lift anything that is heavier than 10 lb (4.5 kg), or the limit that you are told, until your health care provider says that it  is safe.  Ask your health care provider what activities are safe for you.A lot of activity during the first week after surgery can increase pain and swelling. For 1 week after your procedure: ? Avoid activities that take a lot of effort, such as exercise or sports. ? You may walk and climb stairs as needed for daily activity, but avoid long walks or climbing stairs for exercise. Managing pain and swelling   Put ice on painful or swollen areas: ? Put ice in a plastic bag. ? Place a towel between your skin and the bag. ? Leave the ice on for 20 minutes, 2-3 times a day. General instructions  Do not take baths, swim, or use a hot tub until your health care provider approves. Ask your health care provider if you may take showers. You may only be allowed to take sponge baths.  Take over-the-counter and prescription medicines only as told by your health care provider.  To prevent or treat constipation while you are taking prescription pain medicine, your health care provider may recommend that you: ? Drink enough fluid to keep your urine pale yellow. ? Take over-the-counter or prescription medicines. ? Eat foods that are high in fiber, such as fresh fruits and vegetables, whole grains, and beans. ? Limit foods that are high in fat and processed sugars, such as fried and sweet foods.  Do not use any products that contain nicotine or tobacco, such as cigarettes and e-cigarettes. If you need help  quitting, ask your health care provider.  Drink enough fluid to keep your urine pale yellow.  Keep all follow-up visits as told by your health care provider. This is important. Contact a health care provider if:  You have more redness, swelling, or pain around your incisions or your groin area.  You have more swelling in your scrotum.  You have more fluid or blood coming from your incisions.  Your incisions feel warm to the touch.  You have severe pain and medicines do not help.  You have  abdominal pain or swelling.  You cannot eat or drink without vomiting.  You cannot urinate or pass a bowel movement.  You faint.  You feel dizzy.  You have nausea and vomiting.  You have a fever. Get help right away if:  You have pus or a bad smell coming from your incisions.  You have chest pain.  You have problems breathing. Summary  Pain, swelling, and bruising are common after the procedure.  Check your incision area every day for signs of infection, such as more redness, swelling, or pain.  Put ice on painful or swollen areas for 20 minutes, 2-3 times a day. This information is not intended to replace advice given to you by your health care provider. Make sure you discuss any questions you have with your health care provider. Document Released: 04/03/2016 Document Revised: 12/26/2016 Document Reviewed: 04/03/2016 Elsevier Patient Education  2020 [employer].

## 2018-12-08 DIAGNOSIS — E1036 Type 1 diabetes mellitus with diabetic cataract: Secondary | ICD-10-CM | POA: Diagnosis not present

## 2018-12-08 DIAGNOSIS — H25013 Cortical age-related cataract, bilateral: Secondary | ICD-10-CM | POA: Diagnosis not present

## 2018-12-09 DIAGNOSIS — M1711 Unilateral primary osteoarthritis, right knee: Secondary | ICD-10-CM | POA: Diagnosis not present

## 2018-12-09 DIAGNOSIS — E1121 Type 2 diabetes mellitus with diabetic nephropathy: Secondary | ICD-10-CM | POA: Diagnosis not present

## 2018-12-17 DIAGNOSIS — Z85828 Personal history of other malignant neoplasm of skin: Secondary | ICD-10-CM | POA: Insufficient documentation

## 2018-12-17 DIAGNOSIS — C44229 Squamous cell carcinoma of skin of left ear and external auricular canal: Secondary | ICD-10-CM | POA: Diagnosis not present

## 2018-12-23 DIAGNOSIS — M1712 Unilateral primary osteoarthritis, left knee: Secondary | ICD-10-CM | POA: Diagnosis not present

## 2018-12-23 DIAGNOSIS — E1121 Type 2 diabetes mellitus with diabetic nephropathy: Secondary | ICD-10-CM | POA: Diagnosis not present

## 2018-12-27 ENCOUNTER — Encounter: Payer: Self-pay | Admitting: Family Medicine

## 2018-12-27 ENCOUNTER — Ambulatory Visit (INDEPENDENT_AMBULATORY_CARE_PROVIDER_SITE_OTHER): Payer: Medicare HMO | Admitting: Family Medicine

## 2018-12-27 VITALS — Temp 98.1°F

## 2018-12-27 DIAGNOSIS — J320 Chronic maxillary sinusitis: Secondary | ICD-10-CM

## 2018-12-27 MED ORDER — AZITHROMYCIN 250 MG PO TABS: ORAL_TABLET | ORAL | 0 refills | Status: DC

## 2018-12-27 NOTE — Progress Notes (Signed)
Date:  12/27/2018   Name:  Maxwell Stanley   DOB:  1934/08/18   MRN:  [id number]   Chief Complaint: Sinusitis (x 3 weeks- congestion/ yellow production from Right side of nose. bad smell in nose. no cough, no fever, no exposure)  I connected withthis patient, Maxwell Stanley, by telephoneat the patient's home.  I verified that I am speaking with the correct person using two identifiers. This visit was conducted via telephone due to the Covid-19 outbreak from my office at [facility] in [place], [place]. I discussed the limitations, risks, security and privacy concerns of performing an evaluation and management service by telephone. I also discussed with the patient that there may be a patient responsible charge related to this service. The patient expressed understanding and agreed to proceed.  Sinusitis This is a new problem. The current episode started 1 to 4 weeks ago (3 weeks). The problem has been gradually improving since onset. There has been no fever. Pertinent negatives include no chills, congestion, coughing, diaphoresis, ear pain, headaches, hoarse voice, neck pain, shortness of breath, sinus pressure, sneezing, sore throat or swollen glands. (Nasal drainage/ smell) Past treatments include oral decongestants and acetaminophen. The treatment provided moderate relief.    Lab Results  Component Value Date   CREATININE 1.23 08/26/2018   BUN 29 (H) 08/26/2018   NA 140 08/26/2018   K 4.1 09/28/2018   CL 102 08/26/2018   CO2 22 08/26/2018   Lab Results  Component Value Date   CHOL 130 12/22/2017   HDL 30 (L) 12/22/2017   LDLCALC 48 12/22/2017   TRIG 262 (H) 12/22/2017   CHOLHDL 4.3 12/22/2017   Lab Results  Component Value Date   TSH 2.420 12/22/2017   Lab Results  Component Value Date   HGBA1C 7.4 (H) 08/26/2018     Review of Systems  Constitutional: Negative for chills, diaphoresis and fever.  HENT: Negative for congestion, drooling, ear discharge, ear  pain, hoarse voice, sinus pressure, sneezing and sore throat.   Respiratory: Negative for cough, shortness of breath and wheezing.   Cardiovascular: Negative for chest pain, palpitations and leg swelling.  Gastrointestinal: Negative for abdominal pain, blood in stool, constipation, diarrhea and nausea.  Endocrine: Negative for polydipsia.  Genitourinary: Negative for dysuria, frequency, hematuria and urgency.  Musculoskeletal: Negative for back pain, myalgias and neck pain.  Skin: Negative for rash.  Allergic/Immunologic: Negative for environmental allergies.  Neurological: Negative for dizziness and headaches.  Hematological: Does not bruise/bleed easily.  Psychiatric/Behavioral: Negative for suicidal ideas. The patient is not nervous/anxious.     Patient Active Problem List   Diagnosis Date Noted  . Persistent atrial fibrillation ([place]) 10/29/2018  . Hyperlipidemia, mixed 10/12/2018  . Reducible right inguinal hernia 09/16/2018  . DDD (degenerative disc disease), lumbar 12/23/2017  . Primary osteoarthritis of right hip 12/23/2017  . Onychomycosis of toenail 08/19/2017  . Allergic rhinitis 08/08/2016  . Carpal tunnel syndrome on left 12/12/2015  . Chronic maxillary sinusitis 04/09/2015  . Type II diabetes mellitus with complication ([place]) 123XX123  . Chronic low back pain 09/21/2014  . Lumbar canal stenosis 09/21/2014  . Abnormal prostate specific antigen 04/17/2014  . Enlarged prostate with lower urinary tract symptoms (LUTS) 04/17/2014  . CKD stage 3 secondary to diabetes ([place]) 04/17/2014  . Hyperlipidemia associated with type 2 diabetes mellitus ([place]) 04/17/2014  . 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 Essential (primary) hypertension 04/17/2014  . Esophagitis, reflux 04/17/2014  . Current use  of proton pump inhibitor 04/17/2014  . Back muscle spasm 04/17/2014  . Degenerative arthritis of finger 04/17/2014  . Foot pain, left 04/17/2014  . Breast  development in males 04/17/2014  . 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 Osteoarthritis of both knees 11/03/2013  . Incomplete emptying of bladder 09/15/2012  . Increased frequency of urination 09/11/2011  . EDEMA 08/24/2009    Allergies  Allergen Reactions  . Codeine Other (See Comments)    Made his "Wild"  . Ceftriaxone Rash  . Penicillin G Rash    Did it involve swelling of the face/tongue/throat, SOB, or low BP? No Did it involve sudden or severe rash/hives, skin peeling, or any reaction on the inside of your mouth or nose? No Did you need to seek medical attention at a hospital or doctor's office? No When did it last happen? If all above answers are "NO", may proceed with cephalosporin use.     Past Surgical History:  Procedure Laterality Date  . ANKLE ARTHROSCOPY WITH OPEN REDUCTION INTERNAL FIXATION (ORIF) Left 1998  . HEMORRHOID SURGERY    . HERNIA REPAIR Left 0000000   umbilical and left inguinal  . INGUINAL HERNIA REPAIR Right 11/12/2018   Procedure: HERNIA REPAIR INGUINAL ADULT;  Surgeon: Fredirick Maudlin, MD;  Location: [facility] ORS;  Service: General;  Laterality: Right;  . KNEE ARTHROSCOPY Right 2006  . skin cancer resection Right 04/07/2018   ON hand.  [name] THUMB ARTHROSCOPY Right 2013   tendon flap to joint    Social History   Tobacco Use  . Smoking status: Never Smoker  . Smokeless tobacco: Never Used  . Tobacco comment: smoking cessation materials not required  Substance Use Topics  . Alcohol use: No    Alcohol/week: 0.0 standard drinks  . Drug use: No     Medication list has been reviewed and updated.  Current Meds  Medication Sig  . acetaminophen (TYLENOL) 500 MG tablet Take 1,000 mg by mouth 2 (two) times daily.   [name] amLODipine (NORVASC) 2.5 MG tablet TAKE 1 TABLET BY MOUTH EVERY DAY (Patient taking differently: Take 2.5 mg by mouth daily. )  . aspirin 81 MG tablet Take 81 mg by mouth 2 (two) times daily.   [name] atenolol (TENORMIN) 50 MG tablet TAKE 1 TABLET  AT BEDTIME  . cholecalciferol (VITAMIN D) 1000 UNITS tablet Take 1,000 Units by mouth at bedtime.   . docusate sodium (COLACE) 100 MG capsule Take 200 mg by mouth daily.   [name] ELIQUIS 5 MG TABS tablet Take 5 mg by mouth 2 (two) times daily.  . finasteride (PROSCAR) 5 MG tablet TAKE 1 TABLET DAILY  . fluorouracil (EFUDEX) 5 % cream Apply 1 application topically daily as needed. For a week  . fluticasone (FLONASE) 50 MCG/ACT nasal spray USE 2 SPRAYS IN EACH       NOSTRIL DAILY (Patient taking differently: Place 2 sprays into both nostrils daily as needed for allergies. )  . glucose blood (ONE TOUCH ULTRA TEST) test strip Use as instructed  . hydrochlorothiazide (MICROZIDE) 12.5 MG capsule TAKE 1 CAPSULE DAILY (IN   PLACE OF COMBINATION       TABLET) (Patient taking differently: Take 12.5 mg by mouth daily. )  . JANUVIA 100 MG tablet TAKE 1 TABLET DAILY (Patient taking differently: Take 100 mg by mouth daily. )  . losartan (COZAAR) 100 MG tablet Take 1 tablet (100 mg total) by mouth daily. (Patient taking differently: Take 50 mg by mouth 2 (two) times daily. )  .  metFORMIN (GLUCOPHAGE-XR) 500 MG 24 hr tablet TAKE 1 TABLET TWICE A DAY (Patient taking differently: Take 1,000 mg by mouth at bedtime. )  . Misc Natural Products (OSTEO BI-FLEX TRIPLE STRENGTH) TABS Take 1 tablet by mouth 2 (two) times daily.   . Multiple Vitamins-Minerals (CENTRUM SILVER) tablet Take 0.5 tablets by mouth 2 (two) times daily.   [name] DELICA LANCETS [id number] MISC 1 each by Does not apply route 2 (two) times daily.  . pantoprazole (PROTONIX) 40 MG tablet TAKE 1 TABLET DAILY  . Probiotic Product (PROBIOTIC DAILY PO) Take 1 tablet by mouth daily.  . rosuvastatin (CRESTOR) 20 MG tablet TAKE 1 TABLET AT BEDTIME  . tamsulosin (FLOMAX) 0.4 MG CAPS capsule TAKE 1 CAPSULE DAILY AT 2PM (Patient taking differently: Take 0.4 mg by mouth daily after breakfast. )  . [DISCONTINUED] ibuprofen (ADVIL) 800 MG tablet Take 1 tablet (800 mg total)  by mouth every 8 (eight) hours as needed.    PHQ 2/9 Scores 12/27/2018 11/22/2018 08/26/2018 07/28/2018  PHQ - 2 Score 0 0 0 0  PHQ- 9 Score 0 - 0 0    BP Readings from Last 3 Encounters:  11/30/18 (!) 152/93  11/22/18 104/60  11/12/18 (!) 141/75    Physical Exam Vitals and nursing note reviewed.  HENT:     Head: Normocephalic.     Right Ear: External ear normal.     Left Ear: External ear normal.     Nose: Nose normal.  Eyes:     General: No scleral icterus.       Right eye: No discharge.        Left eye: No discharge.     Conjunctiva/sclera: Conjunctivae normal.     Pupils: Pupils are equal, round, and reactive to light.  Neck:     Thyroid: No thyromegaly.     Vascular: No carotid bruit or JVD.     Trachea: No tracheal deviation.  Cardiovascular:     Rate and Rhythm: Normal rate and regular rhythm.     Heart sounds: Normal heart sounds. No murmur. No friction rub. No gallop.   Pulmonary:     Effort: No respiratory distress.     Breath sounds: Normal breath sounds. No wheezing or rales.  Abdominal:     General: Bowel sounds are normal.     Palpations: Abdomen is soft. There is no mass.     Tenderness: There is no abdominal tenderness. There is no guarding or rebound.  Musculoskeletal:        General: No tenderness. Normal range of motion.     Cervical back: Normal range of motion and neck supple.  Lymphadenopathy:     Cervical: No cervical adenopathy.  Skin:    General: Skin is warm.     Findings: No rash.  Neurological:     Mental Status: He is alert and oriented to person, place, and time.     Cranial Nerves: No cranial nerve deficit.     Deep Tendon Reflexes: Reflexes are normal and symmetric.     Wt Readings from Last 3 Encounters:  11/30/18 194 lb 6.4 oz (88.2 kg)  11/22/18 187 lb 8 oz (85 kg)  11/12/18 190 lb (86.2 kg)    Temp 98.1 F (36.7 C) (Oral)   Assessment and Plan: 1. Chronic maxillary sinusitis Chronic.  New onset of symptoms.  We will  initiate treatment with a azithromycin to 50 mg 2 tablets today followed by 1 a day for 4 days.  Patient is a return call if there is no resolution to his concern. - azithromycin (ZITHROMAX) 250 MG tablet; Take 2 tablet todaythen 1 a day for 4 days  Dispense: 6 tablet; Refill: 0

## 2019-01-07 DIAGNOSIS — Z85828 Personal history of other malignant neoplasm of skin: Secondary | ICD-10-CM

## 2019-01-07 HISTORY — DX: Personal history of other malignant neoplasm of skin: Z85.828

## 2019-01-09 ENCOUNTER — Other Ambulatory Visit: Payer: Self-pay | Admitting: Internal Medicine

## 2019-01-21 ENCOUNTER — Other Ambulatory Visit: Payer: Self-pay | Admitting: Internal Medicine

## 2019-01-21 DIAGNOSIS — E1121 Type 2 diabetes mellitus with diabetic nephropathy: Secondary | ICD-10-CM

## 2019-01-27 DIAGNOSIS — L570 Actinic keratosis: Secondary | ICD-10-CM | POA: Diagnosis not present

## 2019-02-02 ENCOUNTER — Encounter: Payer: Self-pay | Admitting: Internal Medicine

## 2019-02-02 ENCOUNTER — Other Ambulatory Visit: Payer: Self-pay

## 2019-02-02 ENCOUNTER — Ambulatory Visit (INDEPENDENT_AMBULATORY_CARE_PROVIDER_SITE_OTHER): Payer: Medicare HMO | Admitting: Internal Medicine

## 2019-02-02 VITALS — BP 124/82 | HR 87 | Temp 97.9°F | Ht 74.0 in | Wt 193.0 lb

## 2019-02-02 DIAGNOSIS — R3912 Poor urinary stream: Secondary | ICD-10-CM

## 2019-02-02 DIAGNOSIS — E118 Type 2 diabetes mellitus with unspecified complications: Secondary | ICD-10-CM

## 2019-02-02 DIAGNOSIS — E1169 Type 2 diabetes mellitus with other specified complication: Secondary | ICD-10-CM

## 2019-02-02 DIAGNOSIS — K2100 Gastro-esophageal reflux disease with esophagitis, without bleeding: Secondary | ICD-10-CM

## 2019-02-02 DIAGNOSIS — E538 Deficiency of other specified B group vitamins: Secondary | ICD-10-CM

## 2019-02-02 DIAGNOSIS — E1122 Type 2 diabetes mellitus with diabetic chronic kidney disease: Secondary | ICD-10-CM | POA: Diagnosis not present

## 2019-02-02 DIAGNOSIS — I10 Essential (primary) hypertension: Secondary | ICD-10-CM

## 2019-02-02 DIAGNOSIS — N401 Enlarged prostate with lower urinary tract symptoms: Secondary | ICD-10-CM

## 2019-02-02 DIAGNOSIS — E785 Hyperlipidemia, unspecified: Secondary | ICD-10-CM | POA: Diagnosis not present

## 2019-02-02 DIAGNOSIS — Z0000 Encounter for general adult medical examination without abnormal findings: Secondary | ICD-10-CM

## 2019-02-02 DIAGNOSIS — I4819 Other persistent atrial fibrillation: Secondary | ICD-10-CM | POA: Diagnosis not present

## 2019-02-02 DIAGNOSIS — N183 Type 2 diabetes mellitus with diabetic chronic kidney disease: Secondary | ICD-10-CM

## 2019-02-02 DIAGNOSIS — R69 Illness, unspecified: Secondary | ICD-10-CM | POA: Diagnosis not present

## 2019-02-02 LAB — POCT URINALYSIS DIPSTICK
Bilirubin, UA: NEGATIVE
Blood, UA: NEGATIVE
Glucose, UA: NEGATIVE
Ketones, UA: NEGATIVE
Leukocytes, UA: NEGATIVE
Nitrite, UA: NEGATIVE
Protein, UA: NEGATIVE
Spec Grav, UA: 1.02 (ref 1.010–1.025)
Urobilinogen, UA: 0.2 E.U./dL
pH, UA: 5 (ref 5.0–8.0)

## 2019-02-02 MED ORDER — AMLODIPINE BESYLATE 2.5 MG PO TABS: 2.5000 mg | ORAL_TABLET | Freq: Every day | ORAL | 3 refills | Status: DC

## 2019-02-02 MED ORDER — ONETOUCH DELICA LANCETS 33G MISC: 1.0000 | Freq: Two times a day (BID) | 12 refills | Status: DC

## 2019-02-02 MED ORDER — ONETOUCH DELICA LANCETS 33G MISC: 1.0000 | Freq: Every day | 12 refills | Status: AC

## 2019-02-02 MED ORDER — GLUCOSE BLOOD VI STRP: ORAL_STRIP | 12 refills | Status: DC

## 2019-02-02 NOTE — Progress Notes (Signed)
Date:  02/02/2019   Name:  Maxwell Stanley   DOB:  [birth year]   MRN:  [id number]   Chief Complaint: Annual Exam Maxwell Stanley is a 85 y.o. male who presents today for his Complete Annual Exam. He feels well. He reports exercising walking. He reports he is sleeping fairly well with melatonin.   Colonoscopy  04/2009 Immunization History  Administered Date(s) Administered  . Fluad Quad(high Dose 65+) 09/16/2018  . Influenza, High Dose Seasonal PF 10/13/2016, 09/09/2017  . Influenza,inj,Quad PF,6+ Mos 09/26/2014  . Influenza-Unspecified 09/26/2014, 10/13/2016  . Pneumococcal Conjugate-13 10/26/2013  . Pneumococcal Polysaccharide-23 01/31/2011  . Tdap 10/03/2010  . Zoster 04/16/2000  . Zoster Recombinat (Shingrix) 08/12/2017, 10/22/2017    Diabetes He presents for his follow-up diabetic visit. He has type 2 diabetes mellitus. His disease course has been stable. Pertinent negatives for hypoglycemia include no dizziness, headaches or nervousness/anxiousness. Pertinent negatives for diabetes include no chest pain and no fatigue. Current diabetic treatment includes oral agent (monotherapy) (januvia and metformin). His weight is stable. He is following a generally healthy diet. He monitors blood glucose at home 1-2 x per day. His breakfast blood glucose is taken between 6-7 am. His breakfast blood glucose range is generally 110-130 mg/dl. An ACE inhibitor/angiotensin II receptor blocker is being taken. Eye exam is not current.  Hypertension This is a chronic problem. The problem is controlled. Pertinent negatives include no chest pain, headaches or palpitations. Past treatments include diuretics, angiotensin blockers and calcium channel blockers.  Hyperlipidemia This is a chronic problem. The problem is controlled. There are no known factors aggravating his hyperlipidemia. Pertinent negatives include no chest pain. Current antihyperlipidemic treatment includes statins. The current  treatment provides significant improvement of lipids.  Benign Prostatic Hypertrophy This is a chronic problem. Irritative symptoms include frequency. Obstructive symptoms include an intermittent stream and a slower stream. Pertinent negatives include no chills, dysuria, hematuria or nausea. Past treatments include tamsulosin.  Gastroesophageal Reflux He complains of heartburn. He reports no abdominal pain, no chest pain, no coughing, no dysphagia, no nausea, no water brash or no wheezing. The problem occurs rarely. Pertinent negatives include no fatigue. He has tried a PPI for the symptoms. The treatment provided significant relief.  Atrial fibrillation - Recently diagnosed. on Eliquis without bleeding issues.  Followed by Cardiology.  Recent rest stress test was normal except for A Fib. 10/2018: FINDINGS: Regional wall motion: reveals normal myocardial thickening and wall  motion. The overall quality of the study is good.  Artifacts noted: no Left ventricular cavity: normal.  Perfusion Analysis: SPECT images demonstrate homogeneous tracer  distribution throughout the myocardium. Defect type: Normal  Lab Results  Component Value Date   CREATININE 1.23 08/26/2018   BUN 29 (H) 08/26/2018   NA 140 08/26/2018   K 4.1 09/28/2018   CL 102 08/26/2018   CO2 22 08/26/2018   Lab Results  Component Value Date   CHOL 130 12/22/2017   HDL 30 (L) 12/22/2017   LDLCALC 48 12/22/2017   TRIG 262 (H) 12/22/2017   CHOLHDL 4.3 12/22/2017   Lab Results  Component Value Date   TSH 2.420 12/22/2017   Lab Results  Component Value Date   HGBA1C 7.4 (H) 08/26/2018     Review of Systems  Constitutional: Negative for chills, fatigue, fever and unexpected weight change.  HENT: Negative for trouble swallowing.   Respiratory: Negative for cough, chest tightness and wheezing.   Cardiovascular: Negative for chest pain, palpitations and leg swelling.  Gastrointestinal: Positive for heartburn.  Negative for abdominal pain, blood in stool, constipation, diarrhea, dysphagia and nausea.       Right hernia repaired and doing well  Genitourinary: Positive for frequency. Negative for dysuria, hematuria, scrotal swelling and testicular pain.  Musculoskeletal: Positive for arthralgias. Negative for gait problem and joint swelling.  Neurological: Negative for dizziness, light-headedness and headaches.  Psychiatric/Behavioral: Negative for dysphoric mood and sleep disturbance. The patient is not nervous/anxious.     Patient Active Problem List   Diagnosis Date Noted  . Gastroesophageal reflux disease with esophagitis 02/02/2019  . Personal history of other malignant neoplasm of skin 12/17/2018  . Persistent atrial fibrillation ([place]) 10/29/2018  . Reducible right inguinal hernia 09/16/2018  . DDD (degenerative disc disease), lumbar 12/23/2017  . Primary osteoarthritis of right hip 12/23/2017  . Onychomycosis of toenail 08/19/2017  . Allergic rhinitis 08/08/2016  . Carpal tunnel syndrome on left 12/12/2015  . Chronic maxillary sinusitis 04/09/2015  . Type II diabetes mellitus with complication ([place]) 123XX123  . Chronic low back pain 09/21/2014  . Lumbar canal stenosis 09/21/2014  . Abnormal prostate specific antigen 04/17/2014  . Enlarged prostate with lower urinary tract symptoms (LUTS) 04/17/2014  . CKD stage 3 secondary to diabetes ([place]) 04/17/2014  . Hyperlipidemia associated with type 2 diabetes mellitus ([place]) 04/17/2014  . 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 Essential (primary) hypertension 04/17/2014  . Current use of proton pump inhibitor 04/17/2014  . Back muscle spasm 04/17/2014  . Degenerative arthritis of finger 04/17/2014  . Foot pain, left 04/17/2014  . Breast development in males 04/17/2014  . 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 Osteoarthritis of both knees 11/03/2013  . Incomplete emptying of bladder 09/15/2012  . Increased frequency of urination  09/11/2011  . EDEMA 08/24/2009    Allergies  Allergen Reactions  . Codeine Other (See Comments)    Made his "Wild"  . Ceftriaxone Rash  . Penicillin G Rash    Did it involve swelling of the face/tongue/throat, SOB, or low BP? No Did it involve sudden or severe rash/hives, skin peeling, or any reaction on the inside of your mouth or nose? No Did you need to seek medical attention at a hospital or doctor's office? No When did it last happen? If all above answers are "NO", may proceed with cephalosporin use.     Past Surgical History:  Procedure Laterality Date  . ANKLE ARTHROSCOPY WITH OPEN REDUCTION INTERNAL FIXATION (ORIF) Left 1998  . HEMORRHOID SURGERY    . HERNIA REPAIR Left 0000000   umbilical and left inguinal  . INGUINAL HERNIA REPAIR Right 11/12/2018   Procedure: HERNIA REPAIR INGUINAL ADULT;  Surgeon: Fredirick Maudlin, MD;  Location: [facility] ORS;  Service: General;  Laterality: Right;  . KNEE ARTHROSCOPY Right 2006  . skin cancer resection Right 04/07/2018   ON hand.  [name] THUMB ARTHROSCOPY Right 2013   tendon flap to joint    Social History   Tobacco Use  . Smoking status: Never Smoker  . Smokeless tobacco: Never Used  . Tobacco comment: smoking cessation materials not required  Substance Use Topics  . Alcohol use: No    Alcohol/week: 0.0 standard drinks  . Drug use: No     Medication list has been reviewed and updated.  Current Meds  Medication Sig  . acetaminophen (TYLENOL) 500 MG tablet Take 1,000 mg by mouth 2 (two) times daily.   [name] amLODipine (NORVASC) 2.5 MG tablet TAKE 1 TABLET BY MOUTH EVERY DAY (Patient  taking differently: Take 2.5 mg by mouth daily. )  . aspirin 81 MG tablet Take 81 mg by mouth 2 (two) times daily.   [name] atenolol (TENORMIN) 50 MG tablet TAKE 1 TABLET AT BEDTIME  . cholecalciferol (VITAMIN D) 1000 UNITS tablet Take 1,000 Units by mouth at bedtime.   . docusate sodium (COLACE) 100 MG capsule Take 200 mg by mouth daily.   [name] ELIQUIS 5  MG TABS tablet Take 5 mg by mouth 2 (two) times daily.  . finasteride (PROSCAR) 5 MG tablet TAKE 1 TABLET DAILY  . fluorouracil (EFUDEX) 5 % cream Apply 1 application topically daily as needed. For a week  . fluticasone (FLONASE) 50 MCG/ACT nasal spray USE 2 SPRAYS IN EACH       NOSTRIL DAILY  . glucose blood (ONE TOUCH ULTRA TEST) test strip Use as instructed  . hydrochlorothiazide (MICROZIDE) 12.5 MG capsule TAKE 1 CAPSULE DAILY (IN   PLACE OF COMBINATION       TABLET) (Patient taking differently: Take 12.5 mg by mouth daily. )  . losartan (COZAAR) 100 MG tablet Take 1 tablet (100 mg total) by mouth daily. (Patient taking differently: Take 50 mg by mouth 2 (two) times daily. )  . metFORMIN (GLUCOPHAGE-XR) 500 MG 24 hr tablet TAKE 1 TABLET TWICE A DAY (Patient taking differently: Take 1,000 mg by mouth at bedtime. )  . Misc Natural Products (OSTEO BI-FLEX TRIPLE STRENGTH) TABS Take 1 tablet by mouth 2 (two) times daily.   . Multiple Vitamins-Minerals (CENTRUM SILVER) tablet Take 0.5 tablets by mouth 2 (two) times daily.   [name] DELICA LANCETS 99991111 MISC 1 each by Does not apply route 2 (two) times daily.  . pantoprazole (PROTONIX) 40 MG tablet TAKE 1 TABLET DAILY  . Probiotic Product (PROBIOTIC DAILY PO) Take 1 tablet by mouth daily.  . rosuvastatin (CRESTOR) 20 MG tablet TAKE 1 TABLET AT BEDTIME  . sitaGLIPtin (JANUVIA) 100 MG tablet Take 1 tablet (100 mg total) by mouth daily.  . tamsulosin (FLOMAX) 0.4 MG CAPS capsule TAKE 1 CAPSULE DAILY AT 2PM (Patient taking differently: Take 0.4 mg by mouth daily after breakfast. )    PHQ 2/9 Scores 02/02/2019 12/27/2018 11/22/2018 08/26/2018  PHQ - 2 Score 0 0 0 0  PHQ- 9 Score 0 0 - 0    BP Readings from Last 3 Encounters:  02/02/19 124/82  11/30/18 (!) 152/93  11/22/18 104/60    Physical Exam Vitals and nursing note reviewed.  Constitutional:      Appearance: Normal appearance. He is well-developed.  HENT:     Head: Normocephalic.      Right Ear: Tympanic membrane, ear canal and external ear normal.     Left Ear: Tympanic membrane, ear canal and external ear normal.     Mouth/Throat:     Pharynx: Uvula midline.  Eyes:     Conjunctiva/sclera: Conjunctivae normal.     Pupils: Pupils are equal, round, and reactive to light.  Neck:     Thyroid: No thyromegaly.     Vascular: No carotid bruit.  Cardiovascular:     Rate and Rhythm: Normal rate. Rhythm irregular.     Pulses: Normal pulses.     Heart sounds: Normal heart sounds.  Pulmonary:     Effort: Pulmonary effort is normal.     Breath sounds: Normal breath sounds. No wheezing.  Chest:     Breasts:        Right: No mass.  Left: No mass.  Abdominal:     General: Bowel sounds are normal.     Palpations: Abdomen is soft.     Tenderness: There is no abdominal tenderness.  Musculoskeletal:        General: Swelling (trace edema at mid tibia) present. Normal range of motion.     Cervical back: Normal range of motion and neck supple.  Lymphadenopathy:     Cervical: No cervical adenopathy.  Skin:    General: Skin is warm and dry.     Capillary Refill: Capillary refill takes less than 2 seconds.  Neurological:     General: No focal deficit present.     Mental Status: He is alert and oriented to person, place, and time.     Deep Tendon Reflexes: Reflexes are normal and symmetric.  Psychiatric:        Speech: Speech normal.        Behavior: Behavior normal.        Thought Content: Thought content normal.        Judgment: Judgment normal.     Wt Readings from Last 3 Encounters:  02/02/19 193 lb (87.5 kg)  11/30/18 194 lb 6.4 oz (88.2 kg)  11/22/18 187 lb 8 oz (85 kg)    BP 124/82   Pulse 87   Temp 97.9 F (36.6 C) (Oral)   Ht 6\' 2"  (1.88 m)   Wt 193 lb (87.5 kg)   SpO2 93%   BMI 24.78 kg/m   Assessment and Plan: 1. Annual physical exam Normal exam.  Continue healthy diet and regular exercise - POCT urinalysis dipstick  2. Essential (primary)  hypertension Clinically stable exam with well controlled BP on amlodipine, atenolol, losartan and HCTZ.. Tolerating medications without side effects at this time. Pt to continue current regimen and low sodium diet; benefits of regular exercise as able discussed. - TSH - amLODipine (NORVASC) 2.5 MG tablet; Take 1 tablet (2.5 mg total) by mouth daily.  Dispense: 90 tablet; Refill: 3  3. Persistent atrial fibrillation (HCC) Rate controlled with minimal symptoms On Eliquis without bleeding issues Will check renal function to verify correct dosing  4. Type II diabetes mellitus with complication (HCC) Clinically stable by exam and report without s/s of hypoglycemia. DM complicated by HTN, lipids. Tolerating medications januvia and metformin well without side effects or other concerns. - Hemoglobin A1c - glucose blood (ONE TOUCH ULTRA TEST) test strip; Use to test blood sugar once daily.  Dispense: 100 each; Refill: 12  5. Hyperlipidemia associated with type 2 diabetes mellitus ([place]) Tolerating statin medication without side effects at this time LDL is at goal of < 70 on current dose Continue same therapy without change at this time. - Lipid panel  6. CKD stage 3 secondary to diabetes [facility]) Followed by Nephrology in the past GFR has been stable around 54 - Comprehensive metabolic panel  7. Gastroesophageal reflux disease with esophagitis, unspecified whether hemorrhage Symptoms well controlled on daily PPI No red flag signs such as weight loss, n/v, melena Will continue pantoprazole. - CBC with Differential/Platelet  8. Benign prostatic hyperplasia with weak urinary stream He no longer sees Urology His obstructive symptoms are unchanged on Tamsulosin - PSA  9. B12 nutritional deficiency Has tingling in the tops of his feet at times which could be related to DM Will check B12 to make sure he is not deficient - Vitamin B12   Partially dictated using Editor, commissioning. Any errors  are unintentional.  Halina Maidens, MD [facility]  [facility]  02/02/2019

## 2019-02-03 ENCOUNTER — Other Ambulatory Visit: Payer: Self-pay

## 2019-02-03 DIAGNOSIS — J320 Chronic maxillary sinusitis: Secondary | ICD-10-CM

## 2019-02-03 LAB — COMPREHENSIVE METABOLIC PANEL
ALT: 29 IU/L (ref 0–44)
AST: 25 IU/L (ref 0–40)
Albumin/Globulin Ratio: 1.9 (ref 1.2–2.2)
Albumin: 4.8 g/dL — ABNORMAL HIGH (ref 3.6–4.6)
Alkaline Phosphatase: 89 IU/L (ref 39–117)
BUN/Creatinine Ratio: 18 (ref 10–24)
BUN: 24 mg/dL (ref 8–27)
Bilirubin Total: 0.8 mg/dL (ref 0.0–1.2)
CO2: 25 mmol/L (ref 20–29)
Calcium: 9.7 mg/dL (ref 8.6–10.2)
Chloride: 100 mmol/L (ref 96–106)
Creatinine, Ser: 1.36 mg/dL — ABNORMAL HIGH (ref 0.76–1.27)
GFR calc Af Amer: 56 mL/min/{1.73_m2} — ABNORMAL LOW (ref >59)
GFR calc non Af Amer: 48 mL/min/{1.73_m2} — ABNORMAL LOW (ref >59)
Globulin, Total: 2.5 g/dL (ref 1.5–4.5)
Glucose: 149 mg/dL — ABNORMAL HIGH (ref 65–99)
Potassium: 4.4 mmol/L (ref 3.5–5.2)
Sodium: 141 mmol/L (ref 134–144)
Total Protein: 7.3 g/dL (ref 6.0–8.5)

## 2019-02-03 LAB — HEMOGLOBIN A1C
Est. average glucose Bld gHb Est-mCnc: 177 mg/dL
Hgb A1c MFr Bld: 7.8 % — ABNORMAL HIGH (ref 4.8–5.6)

## 2019-02-03 LAB — LIPID PANEL
Chol/HDL Ratio: 4.1 ratio (ref 0.0–5.0)
Cholesterol, Total: 147 mg/dL (ref 100–199)
HDL: 36 mg/dL — ABNORMAL LOW (ref >39)
LDL Chol Calc (NIH): 80 mg/dL (ref 0–99)
Triglycerides: 179 mg/dL — ABNORMAL HIGH (ref 0–149)
VLDL Cholesterol Cal: 31 mg/dL (ref 5–40)

## 2019-02-03 LAB — CBC WITH DIFFERENTIAL/PLATELET
Basophils Absolute: 0.1 10*3/uL (ref 0.0–0.2)
Basos: 1 %
EOS (ABSOLUTE): 0.3 10*3/uL (ref 0.0–0.4)
Eos: 4 %
Hematocrit: 44.5 % (ref 37.5–51.0)
Hemoglobin: 15.4 g/dL (ref 13.0–17.7)
Immature Grans (Abs): 0 10*3/uL (ref 0.0–0.1)
Immature Granulocytes: 0 %
Lymphocytes Absolute: 2.5 10*3/uL (ref 0.7–3.1)
Lymphs: 36 %
MCH: 33.2 pg — ABNORMAL HIGH (ref 26.6–33.0)
MCHC: 34.6 g/dL (ref 31.5–35.7)
MCV: 96 fL (ref 79–97)
Monocytes Absolute: 0.7 10*3/uL (ref 0.1–0.9)
Monocytes: 10 %
Neutrophils Absolute: 3.5 10*3/uL (ref 1.4–7.0)
Neutrophils: 49 %
Platelets: 207 10*3/uL (ref 150–450)
RBC: 4.64 x10E6/uL (ref 4.14–5.80)
RDW: 13.5 % (ref 11.6–15.4)
WBC: 7.1 10*3/uL (ref 3.4–10.8)

## 2019-02-03 LAB — VITAMIN B12: Vitamin B-12: 739 pg/mL (ref 232–1245)

## 2019-02-03 LAB — PSA: Prostate Specific Ag, Serum: 0.6 ng/mL (ref 0.0–4.0)

## 2019-02-03 LAB — TSH: TSH: 2.92 u[IU]/mL (ref 0.450–4.500)

## 2019-02-03 MED ORDER — AZITHROMYCIN 250 MG PO TABS: ORAL_TABLET | ORAL | 0 refills | Status: DC

## 2019-03-01 DIAGNOSIS — I4819 Other persistent atrial fibrillation: Secondary | ICD-10-CM | POA: Diagnosis not present

## 2019-03-01 DIAGNOSIS — I10 Essential (primary) hypertension: Secondary | ICD-10-CM | POA: Diagnosis not present

## 2019-03-01 DIAGNOSIS — N183 Chronic kidney disease, stage 3 unspecified: Secondary | ICD-10-CM | POA: Diagnosis not present

## 2019-03-01 DIAGNOSIS — E782 Mixed hyperlipidemia: Secondary | ICD-10-CM | POA: Diagnosis not present

## 2019-03-01 DIAGNOSIS — E1122 Type 2 diabetes mellitus with diabetic chronic kidney disease: Secondary | ICD-10-CM | POA: Diagnosis not present

## 2019-03-04 DIAGNOSIS — R69 Illness, unspecified: Secondary | ICD-10-CM | POA: Diagnosis not present

## 2019-03-10 DIAGNOSIS — J329 Chronic sinusitis, unspecified: Secondary | ICD-10-CM | POA: Diagnosis not present

## 2019-03-21 DIAGNOSIS — D485 Neoplasm of uncertain behavior of skin: Secondary | ICD-10-CM | POA: Diagnosis not present

## 2019-03-21 DIAGNOSIS — C44229 Squamous cell carcinoma of skin of left ear and external auricular canal: Secondary | ICD-10-CM | POA: Diagnosis not present

## 2019-03-21 DIAGNOSIS — L570 Actinic keratosis: Secondary | ICD-10-CM | POA: Diagnosis not present

## 2019-03-24 DIAGNOSIS — J329 Chronic sinusitis, unspecified: Secondary | ICD-10-CM | POA: Diagnosis not present

## 2019-03-24 DIAGNOSIS — J340 Abscess, furuncle and carbuncle of nose: Secondary | ICD-10-CM | POA: Diagnosis not present

## 2019-04-02 DIAGNOSIS — R69 Illness, unspecified: Secondary | ICD-10-CM | POA: Diagnosis not present

## 2019-04-20 DIAGNOSIS — E1121 Type 2 diabetes mellitus with diabetic nephropathy: Secondary | ICD-10-CM | POA: Diagnosis not present

## 2019-04-20 DIAGNOSIS — M170 Bilateral primary osteoarthritis of knee: Secondary | ICD-10-CM | POA: Diagnosis not present

## 2019-04-22 DIAGNOSIS — C44229 Squamous cell carcinoma of skin of left ear and external auricular canal: Secondary | ICD-10-CM | POA: Diagnosis not present

## 2019-05-16 DIAGNOSIS — B958 Unspecified staphylococcus as the cause of diseases classified elsewhere: Secondary | ICD-10-CM | POA: Diagnosis not present

## 2019-05-16 DIAGNOSIS — L0889 Other specified local infections of the skin and subcutaneous tissue: Secondary | ICD-10-CM | POA: Diagnosis not present

## 2019-05-19 ENCOUNTER — Encounter: Payer: Self-pay | Admitting: Internal Medicine

## 2019-05-19 ENCOUNTER — Ambulatory Visit (INDEPENDENT_AMBULATORY_CARE_PROVIDER_SITE_OTHER): Payer: Medicare HMO | Admitting: Internal Medicine

## 2019-05-19 ENCOUNTER — Other Ambulatory Visit: Payer: Self-pay

## 2019-05-19 VITALS — BP 136/82 | HR 72 | Temp 96.9°F | Ht 74.0 in | Wt 192.0 lb

## 2019-05-19 DIAGNOSIS — M170 Bilateral primary osteoarthritis of knee: Secondary | ICD-10-CM | POA: Diagnosis not present

## 2019-05-19 DIAGNOSIS — E118 Type 2 diabetes mellitus with unspecified complications: Secondary | ICD-10-CM | POA: Diagnosis not present

## 2019-05-19 DIAGNOSIS — I10 Essential (primary) hypertension: Secondary | ICD-10-CM

## 2019-05-19 MED ORDER — METFORMIN HCL ER 500 MG PO TB24: 500.0000 mg | ORAL_TABLET | Freq: Three times a day (TID) | ORAL | 3 refills | Status: DC

## 2019-05-19 NOTE — Patient Instructions (Signed)
Add a third metformin with breakfast every day.

## 2019-05-19 NOTE — Progress Notes (Signed)
Date:  05/19/2019   Name:  Maxwell Stanley   DOB:  12-Nov-1934   MRN:  [id number]   Chief Complaint: Diabetes (4 months ) and Hypertension  Diabetes He presents for his follow-up diabetic visit. He has type 2 diabetes mellitus. His disease course has been stable. Pertinent negatives for hypoglycemia include no headaches or tremors. Pertinent negatives for diabetes include no chest pain and no fatigue. Current diabetic treatment includes oral agent (monotherapy) (metformin and Januvia). An ACE inhibitor/angiotensin II receptor blocker is being taken. Eye exam is current.  Hypertension This is a chronic problem. The problem is controlled. Pertinent negatives include no chest pain, headaches, palpitations or shortness of breath. Past treatments include angiotensin blockers, diuretics, beta blockers and calcium channel blockers. The current treatment provides significant improvement. There are no compliance problems.   Knee Pain  There was no injury mechanism. The pain is present in the left knee and right knee. The quality of the pain is described as aching. The pain is moderate. Pertinent negatives include no numbness. He has tried NSAIDs (now on celebrex) for the symptoms.    Lab Results  Component Value Date   CREATININE 1.36 (H) 02/02/2019   BUN 24 02/02/2019   NA 141 02/02/2019   K 4.4 02/02/2019   CL 100 02/02/2019   CO2 25 02/02/2019   Lab Results  Component Value Date   CHOL 147 02/02/2019   HDL 36 (L) 02/02/2019   LDLCALC 80 02/02/2019   TRIG 179 (H) 02/02/2019   CHOLHDL 4.1 02/02/2019   Lab Results  Component Value Date   TSH 2.920 02/02/2019   Lab Results  Component Value Date   HGBA1C 7.8 (H) 02/02/2019   Lab Results  Component Value Date   WBC 7.1 02/02/2019   HGB 15.4 02/02/2019   HCT 44.5 02/02/2019   MCV 96 02/02/2019   PLT 207 02/02/2019   Lab Results  Component Value Date   ALT 29 02/02/2019   AST 25 02/02/2019   ALKPHOS 89 02/02/2019   BILITOT  0.8 02/02/2019     Review of Systems  Constitutional: Negative for appetite change, fatigue and unexpected weight change.  Eyes: Negative for visual disturbance.  Respiratory: Negative for cough, shortness of breath and wheezing.   Cardiovascular: Negative for chest pain, palpitations and leg swelling.  Gastrointestinal: Negative for abdominal pain and blood in stool.  Genitourinary: Negative for dysuria and hematuria.  Musculoskeletal: Positive for arthralgias and gait problem.  Skin: Negative for color change and rash.  Neurological: Negative for tremors, numbness and headaches.  Psychiatric/Behavioral: Negative for dysphoric mood and sleep disturbance.    Patient Active Problem List   Diagnosis Date Noted  . Gastroesophageal reflux disease with esophagitis 02/02/2019  . Personal history of other malignant neoplasm of skin 12/17/2018  . Persistent atrial fibrillation ([place]) 10/29/2018  . Reducible right inguinal hernia 09/16/2018  . DDD (degenerative disc disease), lumbar 12/23/2017  . Primary osteoarthritis of right hip 12/23/2017  . Onychomycosis of toenail 08/19/2017  . Allergic rhinitis 08/08/2016  . Carpal tunnel syndrome on left 12/12/2015  . Chronic maxillary sinusitis 04/09/2015  . Type II diabetes mellitus with complication ([place]) 123XX123  . Chronic low back pain 09/21/2014  . Lumbar canal stenosis 09/21/2014  . Abnormal prostate specific antigen 04/17/2014  . Enlarged prostate with lower urinary tract symptoms (LUTS) 04/17/2014  . CKD stage 3 secondary to diabetes ([place]) 04/17/2014  . Hyperlipidemia associated with type 2 diabetes mellitus ([place]) 04/17/2014  .  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 Essential (primary) hypertension 04/17/2014  . Current use of proton pump inhibitor 04/17/2014  . Back muscle spasm 04/17/2014  . Degenerative arthritis of finger 04/17/2014  . Foot pain, left 04/17/2014  . Breast development in males 04/17/2014  .  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 Osteoarthritis of both knees 11/03/2013  . Incomplete emptying of bladder 09/15/2012  . Increased frequency of urination 09/11/2011  . EDEMA 08/24/2009    Allergies  Allergen Reactions  . Codeine Other (See Comments)    Made his "Wild"  . Ceftriaxone Rash  . Penicillin G Rash    Did it involve swelling of the face/tongue/throat, SOB, or low BP? No Did it involve sudden or severe rash/hives, skin peeling, or any reaction on the inside of your mouth or nose? No Did you need to seek medical attention at a hospital or doctor's office? No When did it last happen? If all above answers are "NO", may proceed with cephalosporin use.     Past Surgical History:  Procedure Laterality Date  . ANKLE ARTHROSCOPY WITH OPEN REDUCTION INTERNAL FIXATION (ORIF) Left 1998  . HEMORRHOID SURGERY    . HERNIA REPAIR Left 0000000   umbilical and left inguinal  . INGUINAL HERNIA REPAIR Right 11/12/2018   Procedure: HERNIA REPAIR INGUINAL ADULT;  Surgeon: Fredirick Maudlin, MD;  Location: [facility] ORS;  Service: General;  Laterality: Right;  . KNEE ARTHROSCOPY Right 2006  . skin cancer resection Right 04/07/2018   ON hand.  [name] THUMB ARTHROSCOPY Right 2013   tendon flap to joint    Social History   Tobacco Use  . Smoking status: Never Smoker  . Smokeless tobacco: Never Used  . Tobacco comment: smoking cessation materials not required  Substance Use Topics  . Alcohol use: No    Alcohol/week: 0.0 standard drinks  . Drug use: No     Medication list has been reviewed and updated.  Current Meds  Medication Sig  . acetaminophen (TYLENOL) 500 MG tablet Take 1,000 mg by mouth 2 (two) times daily.   [name] amLODipine (NORVASC) 2.5 MG tablet Take 1 tablet (2.5 mg total) by mouth daily.  [name] atenolol (TENORMIN) 50 MG tablet TAKE 1 TABLET AT BEDTIME  . cholecalciferol (VITAMIN D) 1000 UNITS tablet Take 1,000 Units by mouth at bedtime.   . docusate sodium (COLACE) 100 MG capsule Take  200 mg by mouth daily.   [name] ELIQUIS 5 MG TABS tablet Take 5 mg by mouth 2 (two) times daily.  . finasteride (PROSCAR) 5 MG tablet TAKE 1 TABLET DAILY  . fluorouracil (EFUDEX) 5 % cream Apply 1 application topically daily as needed. For a week  . fluticasone (FLONASE) 50 MCG/ACT nasal spray USE 2 SPRAYS IN EACH       NOSTRIL DAILY  . glucose blood (ONE TOUCH ULTRA TEST) test strip Use to test blood sugar once daily.  . hydrochlorothiazide (MICROZIDE) 12.5 MG capsule TAKE 1 CAPSULE DAILY (IN   PLACE OF COMBINATION       TABLET) (Patient taking differently: Take 12.5 mg by mouth daily. )  . losartan (COZAAR) 100 MG tablet Take 1 tablet (100 mg total) by mouth daily. (Patient taking differently: Take 50 mg by mouth 2 (two) times daily. )  . metFORMIN (GLUCOPHAGE-XR) 500 MG 24 hr tablet TAKE 1 TABLET TWICE A DAY (Patient taking differently: Take 1,000 mg by mouth at bedtime. )  . Misc Natural Products (OSTEO BI-FLEX TRIPLE STRENGTH) TABS Take 1  tablet by mouth 2 (two) times daily.   . Multiple Vitamins-Minerals (CENTRUM SILVER) tablet Take 0.5 tablets by mouth 2 (two) times daily.   [name] Delica Lancets 99991111 MISC 1 each by Does not apply route daily. Use to test blood sugar once daily.  . pantoprazole (PROTONIX) 40 MG tablet TAKE 1 TABLET DAILY  . Probiotic Product (PROBIOTIC DAILY PO) Take 1 tablet by mouth daily.  . rosuvastatin (CRESTOR) 20 MG tablet TAKE 1 TABLET AT BEDTIME  . sitaGLIPtin (JANUVIA) 100 MG tablet Take 1 tablet (100 mg total) by mouth daily.  . tamsulosin (FLOMAX) 0.4 MG CAPS capsule TAKE 1 CAPSULE DAILY AT 2PM (Patient taking differently: Take 0.4 mg by mouth daily after breakfast. )    PHQ 2/9 Scores 05/19/2019 02/02/2019 12/27/2018 11/22/2018  PHQ - 2 Score 0 0 0 0  PHQ- 9 Score 0 0 0 -    BP Readings from Last 3 Encounters:  05/19/19 136/82  02/02/19 124/82  11/30/18 (!) 152/93    Physical Exam Vitals and nursing note reviewed.  Constitutional:      General: He is  not in acute distress.    Appearance: He is well-developed.  HENT:     Head: Normocephalic and atraumatic.  Neck:     Vascular: No carotid bruit.  Cardiovascular:     Rate and Rhythm: Normal rate and regular rhythm.     Pulses: Normal pulses.  Pulmonary:     Effort: Pulmonary effort is normal. No respiratory distress.     Breath sounds: No wheezing or rhonchi.  Musculoskeletal:     Right lower leg: No edema.     Left lower leg: No edema.  Lymphadenopathy:     Cervical: No cervical adenopathy.  Skin:    General: Skin is warm and dry.     Findings: No rash.       Neurological:     Mental Status: He is alert and oriented to person, place, and time.  Psychiatric:        Behavior: Behavior normal.        Thought Content: Thought content normal.     Wt Readings from Last 3 Encounters:  05/19/19 192 lb (87.1 kg)  02/02/19 193 lb (87.5 kg)  11/30/18 194 lb 6.4 oz (88.2 kg)    BP 136/82   Pulse 72   Temp (!) 96.9 F (36.1 C) (Temporal)   Ht 6\' 2"  (1.88 m)   Wt 192 lb (87.1 kg)   SpO2 100%   BMI 24.65 kg/m   Assessment and Plan: 1. Type II diabetes mellitus with complication (HCC) Clinically stable by exam and report without s/s of hypoglycemia. However, glucoses generally too high. Will add third metformin with breakfast. DM complicated by HTN, lipids. Tolerating medications well without side effects or other concerns. - Basic metabolic panel - Hemoglobin A1c - metFORMIN (GLUCOPHAGE-XR) 500 MG 24 hr tablet; Take 1 tablet (500 mg total) by mouth 3 (three) times daily.  Dispense: 270 tablet; Refill: 3  2. Essential (primary) hypertension Clinically stable exam with well controlled BP on four agents. Tolerating medications without side effects at this time. Pt to continue current regimen and low sodium diet; benefits of regular exercise as able discussed.  3. Primary osteoarthritis of both knees Now on Celebrex 200 mg daily No stomach issues or  bleeding    Partially dictated using Editor, commissioning. Any errors are unintentional.  Halina Maidens, MD [facility]  05/19/2019

## 2019-05-20 LAB — BASIC METABOLIC PANEL
BUN/Creatinine Ratio: 20 (ref 10–24)
BUN: 27 mg/dL (ref 8–27)
CO2: 22 mmol/L (ref 20–29)
Calcium: 9.9 mg/dL (ref 8.6–10.2)
Chloride: 102 mmol/L (ref 96–106)
Creatinine, Ser: 1.38 mg/dL — ABNORMAL HIGH (ref 0.76–1.27)
GFR calc Af Amer: 55 mL/min/{1.73_m2} — ABNORMAL LOW (ref >59)
GFR calc non Af Amer: 47 mL/min/{1.73_m2} — ABNORMAL LOW (ref >59)
Glucose: 193 mg/dL — ABNORMAL HIGH (ref 65–99)
Potassium: 4.7 mmol/L (ref 3.5–5.2)
Sodium: 141 mmol/L (ref 134–144)

## 2019-05-20 LAB — HEMOGLOBIN A1C
Est. average glucose Bld gHb Est-mCnc: 192 mg/dL
Hgb A1c MFr Bld: 8.3 % — ABNORMAL HIGH (ref 4.8–5.6)

## 2019-06-01 ENCOUNTER — Other Ambulatory Visit: Payer: Self-pay | Admitting: Internal Medicine

## 2019-06-01 DIAGNOSIS — E1121 Type 2 diabetes mellitus with diabetic nephropathy: Secondary | ICD-10-CM

## 2019-06-01 MED ORDER — SITAGLIPTIN PHOSPHATE 100 MG PO TABS: 100.0000 mg | ORAL_TABLET | Freq: Every day | ORAL | 2 refills | Status: DC

## 2019-06-01 NOTE — Telephone Encounter (Signed)
Pt request refill   sitaGLIPtin (JANUVIA) 100 MG tablet  90 day  [facility] [place] to Registered [place] Sites Phone:  [phone]  Fax:  [phone]

## 2019-06-08 DIAGNOSIS — C44229 Squamous cell carcinoma of skin of left ear and external auricular canal: Secondary | ICD-10-CM | POA: Diagnosis not present

## 2019-06-13 DIAGNOSIS — R69 Illness, unspecified: Secondary | ICD-10-CM | POA: Diagnosis not present

## 2019-07-08 ENCOUNTER — Telehealth: Payer: Self-pay | Admitting: Internal Medicine

## 2019-07-08 NOTE — Telephone Encounter (Signed)
Copied from [place] [phone]. Topic: General - Other >> Jul 08, 2019  1:36 PM Celene Kras wrote: Reason for CRM: Pts wife called stating that the pt is having trouble with his medications. Pts wife states that the pt is exhausted and is concerned the medication may be the reason. Pt is going out of town on 07/14/19 and is requesting to be fit in the schedule before then. Please advise.

## 2019-07-08 NOTE — Telephone Encounter (Signed)
Patient scheduled for Wednesday next week at 840AM. Pt has no complaints of SOB or Chest Pain. Told him to go to the ER if he does develop them.   CM

## 2019-07-13 ENCOUNTER — Ambulatory Visit (INDEPENDENT_AMBULATORY_CARE_PROVIDER_SITE_OTHER): Payer: Medicare HMO | Admitting: Internal Medicine

## 2019-07-13 ENCOUNTER — Encounter: Payer: Self-pay | Admitting: Internal Medicine

## 2019-07-13 ENCOUNTER — Other Ambulatory Visit: Payer: Self-pay

## 2019-07-13 VITALS — BP 122/82 | HR 77 | Temp 98.1°F | Ht 74.0 in | Wt 189.0 lb

## 2019-07-13 DIAGNOSIS — E118 Type 2 diabetes mellitus with unspecified complications: Secondary | ICD-10-CM

## 2019-07-13 MED ORDER — METFORMIN HCL ER 500 MG PO TB24: 500.0000 mg | ORAL_TABLET | Freq: Two times a day (BID) | ORAL | 0 refills | Status: DC

## 2019-07-13 NOTE — Progress Notes (Signed)
Date:  07/13/2019   Name:  Maxwell Stanley   DOB:  September 16, 1934   MRN:  [id number]   Chief Complaint: Fatigue (too much medicine?? metformin didnt take it until night time and felt good during the day but he took 3 at night scared he took to many at once [place] tired, dizzy )  Diabetes He presents for his follow-up diabetic visit. He has type 2 diabetes mellitus. His disease course has been worsening. Pertinent negatives for hypoglycemia include no dizziness. Associated symptoms include fatigue. Pertinent negatives for diabetes include no chest pain. Current diabetic treatment includes oral agent (dual therapy) (has not tolerated the increase in metformin).  He feels woozy, off balance and fatigued since increasing to three per day.  He rarely checks his BS but it was 140 today after taking 3 metformin last night.  His BP has been stable.  He remains in Afib.  Lab Results  Component Value Date   CREATININE 1.38 (H) 05/19/2019   BUN 27 05/19/2019   NA 141 05/19/2019   K 4.7 05/19/2019   CL 102 05/19/2019   CO2 22 05/19/2019   Lab Results  Component Value Date   CHOL 147 02/02/2019   HDL 36 (L) 02/02/2019   LDLCALC 80 02/02/2019   TRIG 179 (H) 02/02/2019   CHOLHDL 4.1 02/02/2019   Lab Results  Component Value Date   TSH 2.920 02/02/2019   Lab Results  Component Value Date   HGBA1C 8.3 (H) 05/19/2019   Lab Results  Component Value Date   WBC 7.1 02/02/2019   HGB 15.4 02/02/2019   HCT 44.5 02/02/2019   MCV 96 02/02/2019   PLT 207 02/02/2019   Lab Results  Component Value Date   ALT 29 02/02/2019   AST 25 02/02/2019   ALKPHOS 89 02/02/2019   BILITOT 0.8 02/02/2019     Review of Systems  Constitutional: Positive for fatigue. Negative for chills and fever.  Respiratory: Negative for choking and shortness of breath.   Cardiovascular: Positive for leg swelling. Negative for chest pain.  Gastrointestinal: Negative for abdominal pain, constipation and diarrhea.    Neurological: Positive for light-headedness. Negative for dizziness.    Patient Active Problem List   Diagnosis Date Noted  . Primary osteoarthritis of both knees 05/19/2019  . Gastroesophageal reflux disease with esophagitis 02/02/2019  . Personal history of other malignant neoplasm of skin 12/17/2018  . Persistent atrial fibrillation ([place]) 10/29/2018  . Reducible right inguinal hernia 09/16/2018  . DDD (degenerative disc disease), lumbar 12/23/2017  . Primary osteoarthritis of right hip 12/23/2017  . Onychomycosis of toenail 08/19/2017  . Allergic rhinitis 08/08/2016  . Carpal tunnel syndrome on left 12/12/2015  . Chronic maxillary sinusitis 04/09/2015  . Type II diabetes mellitus with complication ([place]) 11/94/1740  . Chronic low back pain 09/21/2014  . Abnormal prostate specific antigen 04/17/2014  . Enlarged prostate with lower urinary tract symptoms (LUTS) 04/17/2014  . CKD stage 3 secondary to diabetes ([place]) 04/17/2014  . Hyperlipidemia associated with type 2 diabetes mellitus ([place]) 04/17/2014  . 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 Essential (primary) hypertension 04/17/2014  . Back muscle spasm 04/17/2014  . Degenerative arthritis of finger 04/17/2014  . Breast development in males 04/17/2014  . 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 EDEMA 08/24/2009    Allergies  Allergen Reactions  . Codeine Other (See Comments)    Made his "Wild"  . Ceftriaxone Rash  . Penicillin G Rash    Did  it involve swelling of the face/tongue/throat, SOB, or low BP? No Did it involve sudden or severe rash/hives, skin peeling, or any reaction on the inside of your mouth or nose? No Did you need to seek medical attention at a hospital or doctor's office? No When did it last happen? If all above answers are "NO", may proceed with cephalosporin use.     Past Surgical History:  Procedure Laterality Date  . ANKLE ARTHROSCOPY WITH OPEN REDUCTION INTERNAL FIXATION (ORIF) Left  1998  . HEMORRHOID SURGERY    . HERNIA REPAIR Left 7341   umbilical and left inguinal  . INGUINAL HERNIA REPAIR Right 11/12/2018   Procedure: HERNIA REPAIR INGUINAL ADULT;  Surgeon: Fredirick Maudlin, MD;  Location: [facility] ORS;  Service: General;  Laterality: Right;  . KNEE ARTHROSCOPY Right 2006  . skin cancer resection Right 04/07/2018   ON hand.  [name] THUMB ARTHROSCOPY Right 2013   tendon flap to joint    Social History   Tobacco Use  . Smoking status: Never Smoker  . Smokeless tobacco: Never Used  . Tobacco comment: smoking cessation materials not required  Vaping Use  . Vaping Use: Never used  Substance Use Topics  . Alcohol use: No    Alcohol/week: 0.0 standard drinks  . Drug use: No     Medication list has been reviewed and updated.  Current Meds  Medication Sig  . acetaminophen (TYLENOL) 500 MG tablet Take 1,000 mg by mouth 2 (two) times daily.   [name] amLODipine (NORVASC) 2.5 MG tablet Take 1 tablet (2.5 mg total) by mouth daily.  [name] atenolol (TENORMIN) 50 MG tablet TAKE 1 TABLET AT BEDTIME  . celecoxib (CELEBREX) 200 MG capsule Take 200 mg by mouth daily.  . cholecalciferol (VITAMIN D) 1000 UNITS tablet Take 1,000 Units by mouth at bedtime.   . docusate sodium (COLACE) 100 MG capsule Take 200 mg by mouth daily.   [name] ELIQUIS 5 MG TABS tablet Take 5 mg by mouth 2 (two) times daily.  . finasteride (PROSCAR) 5 MG tablet TAKE 1 TABLET DAILY  . fluorouracil (EFUDEX) 5 % cream Apply 1 application topically daily as needed. For a week  . fluticasone (FLONASE) 50 MCG/ACT nasal spray USE 2 SPRAYS IN EACH       NOSTRIL DAILY  . glucose blood (ONE TOUCH ULTRA TEST) test strip Use to test blood sugar once daily.  . hydrochlorothiazide (MICROZIDE) 12.5 MG capsule TAKE 1 CAPSULE DAILY (IN   PLACE OF COMBINATION       TABLET) (Patient taking differently: Take 12.5 mg by mouth daily. )  . losartan (COZAAR) 100 MG tablet Take 1 tablet (100 mg total) by mouth daily. (Patient taking differently:  Take 50 mg by mouth 2 (two) times daily. )  . metFORMIN (GLUCOPHAGE-XR) 500 MG 24 hr tablet Take 1 tablet (500 mg total) by mouth 3 (three) times daily.  . Misc Natural Products (OSTEO BI-FLEX TRIPLE STRENGTH) TABS Take 1 tablet by mouth 2 (two) times daily.   . Multiple Vitamins-Minerals (CENTRUM SILVER) tablet Take 0.5 tablets by mouth 2 (two) times daily.   [name] Delica Lancets 93X MISC 1 each by Does not apply route daily. Use to test blood sugar once daily.  . pantoprazole (PROTONIX) 40 MG tablet TAKE 1 TABLET DAILY  . Probiotic Product (PROBIOTIC DAILY PO) Take 1 tablet by mouth daily.  . rosuvastatin (CRESTOR) 20 MG tablet TAKE 1 TABLET AT BEDTIME  . sitaGLIPtin (JANUVIA) 100 MG tablet Take 1 tablet (  100 mg total) by mouth daily.  . tamsulosin (FLOMAX) 0.4 MG CAPS capsule TAKE 1 CAPSULE DAILY AT 2PM (Patient taking differently: Take 0.4 mg by mouth daily after breakfast. )    PHQ 2/9 Scores 07/13/2019 05/19/2019 02/02/2019 12/27/2018  PHQ - 2 Score 0 0 0 0  PHQ- 9 Score 3 0 0 0    GAD 7 : Generalized Anxiety Score 07/13/2019 05/19/2019 12/27/2018  Nervous, Anxious, on Edge 0 0 0  Control/stop worrying 0 0 0  Worry too much - different things 0 0 0  Trouble relaxing 0 0 0  Restless 0 0 0  Easily annoyed or irritable 0 0 0  Afraid - awful might happen 0 0 0  Total GAD 7 Score 0 0 0  Anxiety Difficulty Not difficult at all Not difficult at all -    BP Readings from Last 3 Encounters:  07/13/19 122/82  05/19/19 136/82  02/02/19 124/82    Physical Exam Vitals and nursing note reviewed.  Constitutional:      General: He is not in acute distress.    Appearance: He is well-developed.  HENT:     Head: Normocephalic and atraumatic.  Cardiovascular:     Rate and Rhythm: Normal rate. Rhythm irregular.  Pulmonary:     Effort: Pulmonary effort is normal. No respiratory distress.     Breath sounds: No wheezing or rhonchi.  Musculoskeletal:     Cervical back: Normal range of  motion.  Lymphadenopathy:     Cervical: No cervical adenopathy.  Skin:    General: Skin is warm and dry.     Findings: No rash.  Neurological:     Mental Status: He is alert and oriented to person, place, and time.  Psychiatric:        Mood and Affect: Mood normal.     Wt Readings from Last 3 Encounters:  07/13/19 189 lb (85.7 kg)  05/19/19 192 lb (87.1 kg)  02/02/19 193 lb (87.5 kg)    BP 122/82   Pulse 77   Temp 98.1 F (36.7 C) (Oral)   Ht 6\' 2"  (1.88 m)   Wt 189 lb (85.7 kg)   SpO2 99%   BMI 24.27 kg/m   Assessment and Plan: 1. Type II diabetes mellitus with complication (HCC) Intolerant to the increase in metformin Will reduce to 2 per day, continue Januvia Check cost of alternatives Work harder on diet improvements Follow up as planned.   Partially dictated using Editor, commissioning. Any errors are unintentional.  Halina Maidens, MD [facility]  07/13/2019

## 2019-07-13 NOTE — Patient Instructions (Addendum)
Call your insurance company and ask about the cost for:  (alternative to [place])  Tradjenta 5 mg  Onglyza 5 mg   Also ask about: other diabetic medications  Actos 15 mg  Amaryl 2 mg

## 2019-08-02 DIAGNOSIS — R69 Illness, unspecified: Secondary | ICD-10-CM | POA: Diagnosis not present

## 2019-08-20 ENCOUNTER — Other Ambulatory Visit: Payer: Self-pay | Admitting: Internal Medicine

## 2019-08-20 NOTE — Telephone Encounter (Signed)
Requested Prescriptions  Pending Prescriptions Disp Refills  . hydrochlorothiazide (MICROZIDE) 12.5 MG capsule [Pharmacy Med Name: HYDROCHLOROT CAP 12.5MG ] 90 capsule 1    Sig: TAKE 1 CAPSULE DAILY (IN   PLACE OF COMBINATION       TABLET)     Cardiovascular: Diuretics - Thiazide Failed - 08/20/2019 10:20 AM      Failed - Cr in normal range and within 360 days    Creatinine, Ser  Date Value Ref Range Status  05/19/2019 1.38 (H) 0.76 - 1.27 mg/dL Final         Passed - Ca in normal range and within 360 days    Calcium  Date Value Ref Range Status  05/19/2019 9.9 8.6 - 10.2 mg/dL Final         Passed - K in normal range and within 360 days    Potassium  Date Value Ref Range Status  05/19/2019 4.7 3.5 - 5.2 mmol/L Final  01/28/2011 4.2 3.5 - 5.1 mmol/L Final    Comment:    POTASSIUM - Slight hemolysis, interpret results with  - caution.          Passed - Na in normal range and within 360 days    Sodium  Date Value Ref Range Status  05/19/2019 141 134 - 144 mmol/L Final         Passed - Last BP in normal range    BP Readings from Last 1 Encounters:  07/13/19 122/82         Passed - Valid encounter within last 6 months    Recent Outpatient Visits          1 month ago Type II diabetes mellitus with complication [facility])   [facility] Glean Hess, MD   3 months ago Type II diabetes mellitus with complication [facility])   [facility] Glean Hess, MD   6 months ago Annual physical exam   [facility] Glean Hess, MD   7 months ago Chronic maxillary sinusitis   [facility] Juline Patch, MD   11 months ago Essential (primary) hypertension   [facility] Glean Hess, MD      Future Appointments            In 1 month [name] Jesse Sans, MD [facility], [place]   In 1 month Ralene Bathe, MD [place]   In 5 months [name], Jesse Sans, MD [facility], [facility]

## 2019-08-30 DIAGNOSIS — I482 Chronic atrial fibrillation, unspecified: Secondary | ICD-10-CM | POA: Diagnosis not present

## 2019-08-30 DIAGNOSIS — I10 Essential (primary) hypertension: Secondary | ICD-10-CM | POA: Diagnosis not present

## 2019-08-30 DIAGNOSIS — E782 Mixed hyperlipidemia: Secondary | ICD-10-CM | POA: Diagnosis not present

## 2019-08-30 DIAGNOSIS — I4819 Other persistent atrial fibrillation: Secondary | ICD-10-CM | POA: Diagnosis not present

## 2019-08-30 DIAGNOSIS — I208 Other forms of angina pectoris: Secondary | ICD-10-CM | POA: Diagnosis not present

## 2019-09-12 ENCOUNTER — Other Ambulatory Visit: Payer: Self-pay | Admitting: Internal Medicine

## 2019-09-12 DIAGNOSIS — R69 Illness, unspecified: Secondary | ICD-10-CM | POA: Diagnosis not present

## 2019-09-12 DIAGNOSIS — I10 Essential (primary) hypertension: Secondary | ICD-10-CM

## 2019-09-22 ENCOUNTER — Other Ambulatory Visit: Payer: Self-pay | Admitting: Internal Medicine

## 2019-09-22 DIAGNOSIS — N401 Enlarged prostate with lower urinary tract symptoms: Secondary | ICD-10-CM

## 2019-09-22 DIAGNOSIS — J329 Chronic sinusitis, unspecified: Secondary | ICD-10-CM | POA: Diagnosis not present

## 2019-09-22 DIAGNOSIS — J340 Abscess, furuncle and carbuncle of nose: Secondary | ICD-10-CM | POA: Diagnosis not present

## 2019-09-27 DIAGNOSIS — R69 Illness, unspecified: Secondary | ICD-10-CM | POA: Diagnosis not present

## 2019-09-29 ENCOUNTER — Ambulatory Visit (INDEPENDENT_AMBULATORY_CARE_PROVIDER_SITE_OTHER): Payer: Medicare HMO | Admitting: Internal Medicine

## 2019-09-29 ENCOUNTER — Other Ambulatory Visit: Payer: Self-pay

## 2019-09-29 ENCOUNTER — Encounter: Payer: Self-pay | Admitting: Internal Medicine

## 2019-09-29 VITALS — BP 110/68 | HR 66 | Ht 74.0 in | Wt 189.0 lb

## 2019-09-29 DIAGNOSIS — I10 Essential (primary) hypertension: Secondary | ICD-10-CM

## 2019-09-29 DIAGNOSIS — E118 Type 2 diabetes mellitus with unspecified complications: Secondary | ICD-10-CM

## 2019-09-29 MED ORDER — METFORMIN HCL ER 500 MG PO TB24: 1500.0000 mg | ORAL_TABLET | Freq: Every day | ORAL | 3 refills | Status: DC

## 2019-09-29 NOTE — Progress Notes (Signed)
Date:  09/29/2019   Name:  Maxwell Stanley   DOB:  12/09/1934   MRN:  [id number]   Chief Complaint: Diabetes (follow up ) and Hypertension  Diabetes He presents for his follow-up diabetic visit. He has type 2 diabetes mellitus. His disease course has been stable. Pertinent negatives for hypoglycemia include no headaches or tremors. Pertinent negatives for diabetes include no blurred vision, no chest pain, no fatigue, no polydipsia and no polyuria. Symptoms are stable. Current diabetic treatment includes oral agent (dual therapy) (increased metformin to 3 per day). He is compliant with treatment all of the time. His weight is stable. An ACE inhibitor/angiotensin II receptor blocker is being taken.  Hypertension This is a chronic problem. The problem is controlled. Pertinent negatives include no blurred vision, chest pain, headaches, palpitations or shortness of breath. Past treatments include angiotensin blockers, calcium channel blockers, beta blockers and diuretics. The current treatment provides significant improvement. There are no compliance problems.   Recent increase in glucose due to steroid taper for sinus infection.  Lab Results  Component Value Date   CREATININE 1.38 (H) 05/19/2019   BUN 27 05/19/2019   NA 141 05/19/2019   K 4.7 05/19/2019   CL 102 05/19/2019   CO2 22 05/19/2019   Lab Results  Component Value Date   CHOL 147 02/02/2019   HDL 36 (L) 02/02/2019   LDLCALC 80 02/02/2019   TRIG 179 (H) 02/02/2019   CHOLHDL 4.1 02/02/2019   Lab Results  Component Value Date   TSH 2.920 02/02/2019   Lab Results  Component Value Date   HGBA1C 8.3 (H) 05/19/2019   Lab Results  Component Value Date   WBC 7.1 02/02/2019   HGB 15.4 02/02/2019   HCT 44.5 02/02/2019   MCV 96 02/02/2019   PLT 207 02/02/2019   Lab Results  Component Value Date   ALT 29 02/02/2019   AST 25 02/02/2019   ALKPHOS 89 02/02/2019   BILITOT 0.8 02/02/2019     Review of Systems    Constitutional: Negative for appetite change, fatigue and unexpected weight change.  Eyes: Negative for blurred vision and visual disturbance.  Respiratory: Negative for cough, shortness of breath and wheezing.   Cardiovascular: Negative for chest pain, palpitations and leg swelling.  Gastrointestinal: Negative for abdominal pain and blood in stool.  Endocrine: Negative for polydipsia and polyuria.  Genitourinary: Negative for dysuria and hematuria.  Skin: Negative for color change and rash.  Neurological: Negative for tremors, numbness and headaches.  Psychiatric/Behavioral: Negative for dysphoric mood.    Patient Active Problem List   Diagnosis Date Noted  . Primary osteoarthritis of both knees 05/19/2019  . Gastroesophageal reflux disease with esophagitis 02/02/2019  . Personal history of other malignant neoplasm of skin 12/17/2018  . Persistent atrial fibrillation ([place]) 10/29/2018  . Reducible right inguinal hernia 09/16/2018  . DDD (degenerative disc disease), lumbar 12/23/2017  . Primary osteoarthritis of right hip 12/23/2017  . Onychomycosis of toenail 08/19/2017  . Allergic rhinitis 08/08/2016  . Carpal tunnel syndrome on left 12/12/2015  . Chronic maxillary sinusitis 04/09/2015  . Type II diabetes mellitus with complication ([place]) 45/80/9983  . Chronic low back pain 09/21/2014  . Abnormal prostate specific antigen 04/17/2014  . Enlarged prostate with lower urinary tract symptoms (LUTS) 04/17/2014  . CKD stage 3 secondary to diabetes ([place]) 04/17/2014  . Hyperlipidemia associated with type 2 diabetes mellitus ([place]) 04/17/2014  . 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 Essential (primary)  hypertension 04/17/2014  . Back muscle spasm 04/17/2014  . Degenerative arthritis of finger 04/17/2014  . Breast development in males 04/17/2014  . 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 EDEMA 08/24/2009    Allergies  Allergen Reactions  . Codeine Other (See Comments)    Made  his "Wild"  . Ceftriaxone Rash  . Penicillin G Rash    Did it involve swelling of the face/tongue/throat, SOB, or low BP? No Did it involve sudden or severe rash/hives, skin peeling, or any reaction on the inside of your mouth or nose? No Did you need to seek medical attention at a hospital or doctor's office? No When did it last happen? If all above answers are "NO", may proceed with cephalosporin use.     Past Surgical History:  Procedure Laterality Date  . ANKLE ARTHROSCOPY WITH OPEN REDUCTION INTERNAL FIXATION (ORIF) Left 1998  . HEMORRHOID SURGERY    . HERNIA REPAIR Left 2671   umbilical and left inguinal  . INGUINAL HERNIA REPAIR Right 11/12/2018   Procedure: HERNIA REPAIR INGUINAL ADULT;  Surgeon: Fredirick Maudlin, MD;  Location: [facility] ORS;  Service: General;  Laterality: Right;  . KNEE ARTHROSCOPY Right 2006  . skin cancer resection Right 04/07/2018   ON hand.  [name] THUMB ARTHROSCOPY Right 2013   tendon flap to joint    Social History   Tobacco Use  . Smoking status: Never Smoker  . Smokeless tobacco: Never Used  . Tobacco comment: smoking cessation materials not required  Vaping Use  . Vaping Use: Never used  Substance Use Topics  . Alcohol use: No    Alcohol/week: 0.0 standard drinks  . Drug use: No     Medication list has been reviewed and updated.  Current Meds  Medication Sig  . acetaminophen (TYLENOL) 500 MG tablet Take 1,000 mg by mouth 2 (two) times daily.   [name] amLODipine (NORVASC) 2.5 MG tablet Take 1 tablet (2.5 mg total) by mouth daily.  [name] atenolol (TENORMIN) 50 MG tablet TAKE 1 TABLET AT BEDTIME  . celecoxib (CELEBREX) 200 MG capsule Take 200 mg by mouth daily.  . cholecalciferol (VITAMIN D) 1000 UNITS tablet Take 1,000 Units by mouth at bedtime.   . clindamycin (CLEOCIN) 150 MG capsule Take by mouth 3 (three) times daily. Sinus infection.  . docusate sodium (COLACE) 100 MG capsule Take 200 mg by mouth daily.   [name] ELIQUIS 5 MG TABS tablet Take 5  mg by mouth 2 (two) times daily.  . finasteride (PROSCAR) 5 MG tablet TAKE 1 TABLET DAILY  . fluorouracil (EFUDEX) 5 % cream Apply 1 application topically daily as needed. For a week  . fluticasone (FLONASE) 50 MCG/ACT nasal spray USE 2 SPRAYS IN EACH       NOSTRIL DAILY  . glucose blood (ONE TOUCH ULTRA TEST) test strip Use to test blood sugar once daily.  . hydrochlorothiazide (MICROZIDE) 12.5 MG capsule TAKE 1 CAPSULE DAILY (IN   PLACE OF COMBINATION       TABLET)  . losartan (COZAAR) 100 MG tablet Take 0.5 tablets (50 mg total) by mouth 2 (two) times daily.  . metFORMIN (GLUCOPHAGE-XR) 500 MG 24 hr tablet Take 1 tablet (500 mg total) by mouth 2 (two) times daily. (Patient taking differently: Take 1,500 mg by mouth at bedtime. )  . Misc Natural Products (OSTEO BI-FLEX TRIPLE STRENGTH) TABS Take 1 tablet by mouth 2 (two) times daily.   . Multiple Vitamins-Minerals (CENTRUM SILVER) tablet Take 0.5 tablets by mouth 2 (two) times  daily.   . OneTouch Delica Lancets 61U MISC 1 each by Does not apply route daily. Use to test blood sugar once daily.  . pantoprazole (PROTONIX) 40 MG tablet TAKE 1 TABLET DAILY  . Probiotic Product (PROBIOTIC DAILY PO) Take 1 tablet by mouth daily.  . rosuvastatin (CRESTOR) 20 MG tablet TAKE 1 TABLET AT BEDTIME  . sitaGLIPtin (JANUVIA) 100 MG tablet Take 1 tablet (100 mg total) by mouth daily.  . tamsulosin (FLOMAX) 0.4 MG CAPS capsule Take 1 capsule (0.4 mg total) by mouth daily after breakfast.    PHQ 2/9 Scores 07/13/2019 05/19/2019 02/02/2019 12/27/2018  PHQ - 2 Score 0 0 0 0  PHQ- 9 Score 3 0 0 0    GAD 7 : Generalized Anxiety Score 07/13/2019 05/19/2019 12/27/2018  Nervous, Anxious, on Edge 0 0 0  Control/stop worrying 0 0 0  Worry too much - different things 0 0 0  Trouble relaxing 0 0 0  Restless 0 0 0  Easily annoyed or irritable 0 0 0  Afraid - awful might happen 0 0 0  Total GAD 7 Score 0 0 0  Anxiety Difficulty Not difficult at all Not difficult at all -     BP Readings from Last 3 Encounters:  09/29/19 110/68  07/13/19 122/82  05/19/19 136/82    Physical Exam Vitals and nursing note reviewed.  Constitutional:      General: He is not in acute distress.    Appearance: He is well-developed.  HENT:     Head: Normocephalic and atraumatic.  Pulmonary:     Effort: Pulmonary effort is normal. No respiratory distress.  Musculoskeletal:        General: Normal range of motion.  Skin:    General: Skin is warm and dry.     Findings: No rash.  Neurological:     Mental Status: He is alert and oriented to person, place, and time.  Psychiatric:        Behavior: Behavior normal.        Thought Content: Thought content normal.     Wt Readings from Last 3 Encounters:  09/29/19 189 lb (85.7 kg)  07/13/19 189 lb (85.7 kg)  05/19/19 192 lb (87.1 kg)    BP 110/68   Pulse 66   Ht 6\' 2"  (1.88 m)   Wt 189 lb (85.7 kg)   SpO2 97%   BMI 24.27 kg/m   Assessment and Plan: 1. Type II diabetes mellitus with complication (HCC) Fair control but with some side effects to three metformin per day.  Also the cost of Januvia is very high. The next medication to add will probably be insulin injections - he does not think he can tolerate SGLT-2 due to urination issues.  I would hesitate to add GLP-1 due to age. - Hemoglobin A1c - metFORMIN (GLUCOPHAGE-XR) 500 MG 24 hr tablet; Take 3 tablets (1,500 mg total) by mouth at bedtime.  Dispense: 270 tablet; Refill: 3  2. Essential (primary) hypertension Clinically stable exam with well controlled BP. Tolerating medications without side effects at this time. Pt to continue current regimen and low sodium diet; benefits of regular exercise as able discussed.   Partially dictated using Editor, commissioning. Any errors are unintentional.  Halina Maidens, MD [facility]  09/29/2019

## 2019-09-30 LAB — HEMOGLOBIN A1C
Est. average glucose Bld gHb Est-mCnc: 174 mg/dL
Hgb A1c MFr Bld: 7.7 % — ABNORMAL HIGH (ref 4.8–5.6)

## 2019-10-04 DIAGNOSIS — M1712 Unilateral primary osteoarthritis, left knee: Secondary | ICD-10-CM | POA: Diagnosis not present

## 2019-10-04 DIAGNOSIS — M1711 Unilateral primary osteoarthritis, right knee: Secondary | ICD-10-CM | POA: Diagnosis not present

## 2019-10-04 DIAGNOSIS — M170 Bilateral primary osteoarthritis of knee: Secondary | ICD-10-CM | POA: Diagnosis not present

## 2019-10-05 ENCOUNTER — Telehealth: Payer: Self-pay | Admitting: *Deleted

## 2019-10-05 NOTE — Chronic Care Management (AMB) (Signed)
  Chronic Care Management   Note  10/05/2019 Name: COOPER MORONEY MRN: [id number] DOB: 05/11/1934  [name] is a 85 y.o. year old male who is a primary care patient of Glean Hess, MD. I reached out to [name] by phone today in response to a referral sent by Mr. Keltin Baird [employer]'s health plan.     Mr. Soulliere was given information about Chronic Care Management services today including:  1. CCM service includes personalized support from designated clinical staff supervised by his physician, including individualized plan of care and coordination with other care providers 2. 24/7 contact phone numbers for assistance for urgent and routine care needs. 3. Service will only be billed when office clinical staff spend 20 minutes or more in a month to coordinate care. 4. Only one practitioner may furnish and bill the service in a calendar month. 5. The patient may stop CCM services at any time (effective at the end of the month) by phone call to the office staff. 6. The patient will be responsible for cost sharing (co-pay) of up to 20% of the service fee (after annual deductible is met).  Patient agreed to services and verbal consent obtained.   Follow up plan: Telephone appointment with care management team member scheduled for:10/25/2019  [place] [facility]

## 2019-10-10 ENCOUNTER — Encounter: Payer: Self-pay | Admitting: Dermatology

## 2019-10-10 ENCOUNTER — Other Ambulatory Visit: Payer: Self-pay

## 2019-10-10 ENCOUNTER — Ambulatory Visit (INDEPENDENT_AMBULATORY_CARE_PROVIDER_SITE_OTHER): Payer: Medicare HMO | Admitting: Dermatology

## 2019-10-10 DIAGNOSIS — L578 Other skin changes due to chronic exposure to nonionizing radiation: Secondary | ICD-10-CM | POA: Diagnosis not present

## 2019-10-10 DIAGNOSIS — Z85828 Personal history of other malignant neoplasm of skin: Secondary | ICD-10-CM

## 2019-10-10 DIAGNOSIS — L570 Actinic keratosis: Secondary | ICD-10-CM | POA: Diagnosis not present

## 2019-10-10 DIAGNOSIS — D485 Neoplasm of uncertain behavior of skin: Secondary | ICD-10-CM

## 2019-10-10 DIAGNOSIS — L813 Cafe au lait spots: Secondary | ICD-10-CM | POA: Diagnosis not present

## 2019-10-10 DIAGNOSIS — D229 Melanocytic nevi, unspecified: Secondary | ICD-10-CM | POA: Diagnosis not present

## 2019-10-10 DIAGNOSIS — L820 Inflamed seborrheic keratosis: Secondary | ICD-10-CM | POA: Diagnosis not present

## 2019-10-10 DIAGNOSIS — Z1283 Encounter for screening for malignant neoplasm of skin: Secondary | ICD-10-CM

## 2019-10-10 DIAGNOSIS — L817 Pigmented purpuric dermatosis: Secondary | ICD-10-CM

## 2019-10-10 DIAGNOSIS — L304 Erythema intertrigo: Secondary | ICD-10-CM | POA: Diagnosis not present

## 2019-10-10 DIAGNOSIS — L905 Scar conditions and fibrosis of skin: Secondary | ICD-10-CM

## 2019-10-10 DIAGNOSIS — L814 Other melanin hyperpigmentation: Secondary | ICD-10-CM

## 2019-10-10 DIAGNOSIS — D1800 Hemangioma unspecified site: Secondary | ICD-10-CM

## 2019-10-10 DIAGNOSIS — B353 Tinea pedis: Secondary | ICD-10-CM | POA: Diagnosis not present

## 2019-10-10 DIAGNOSIS — D692 Other nonthrombocytopenic purpura: Secondary | ICD-10-CM

## 2019-10-10 DIAGNOSIS — L821 Other seborrheic keratosis: Secondary | ICD-10-CM

## 2019-10-10 DIAGNOSIS — D489 Neoplasm of uncertain behavior, unspecified: Secondary | ICD-10-CM

## 2019-10-10 MED ORDER — CLOTRIMAZOLE 1 % EX CREA: 1.0000 "application " | TOPICAL_CREAM | Freq: Two times a day (BID) | CUTANEOUS | 11 refills | Status: DC

## 2019-10-10 NOTE — Progress Notes (Signed)
New Patient Visit  Subjective  Maxwell Stanley is a 85 y.o. male who presents for the following: New Patient (Initial Visit) (TBSE). The patient presents for Total-Body Skin Exam (TBSE) for skin cancer screening and mole check. Patient presents today as a new patient to establish care, would like TBSE, has a few areas of concern on R chest, Scalp, Right cheek, Neck and under right eye. Patient has a hx of skin cancer. Patient also has h/o Mohs surgery on B/L ear, and Left ear is still tender. Mohs performed by Dr. Lacinda Axon.  The following portions of the chart were reviewed this encounter and updated as appropriate:  Tobacco  Allergies  Meds  Problems  Med Hx  Surg Hx  Fam Hx     Review of Systems:  No other skin or systemic complaints except as noted in HPI or Assessment and Plan.  Objective  Well appearing patient in no apparent distress; mood and affect are within normal limits.  A full examination was performed including scalp, head, eyes, ears, nose, lips, neck, chest, axillae, abdomen, back, buttocks, bilateral upper extremities, bilateral lower extremities, hands, feet, fingers, toes, fingernails, and toenails. All findings within normal limits unless otherwise noted below.  Objective  Right Chest pectoral: With Pinkness  Objective  Right Foot - Anterior: Scaling and maceration web spaces and over distal and lateral soles.   Objective  Right Buttock: Tan-brown macule   Objective  Right Chest Pectoral: 7 x 3.5 cm scar with pinkness     Objective  Face Trunk and Extremities x 20 (20): Erythematous thin papules/macules with gritty scale.   Objective  Face, Trunk and Extremities x 16 (16): Erythematous keratotic or waxy stuck-on papule or plaque.   Objective  Right Lower Leg - Anterior: Violaceous macules and patches.   Assessment & Plan  Scar from previous skin cancer removal Right Chest pectoral History of sores within this area.  See biopsy  procedure..   Tinea pedis of feet Feet Start Ketoconazole cream apply to entire foot and in between toes twice daily as needed  Cafe au lait spots Right Buttock Birthmark Benign-appearing.  Observation.  Call clinic for new or changing lesions.  Recommend daily use of broad spectrum spf 30+ sunscreen to sun-exposed areas.   Erythema intertrigo Groin Start ketoconazole cream to affected areas in groin, if not working will send in Skin Medicinal intertrigo cream mix  Neoplasm of uncertain behavior Right Chest Pectoral  Skin / nail biopsy Type of biopsy: tangential   Informed consent: discussed and consent obtained   Timeout: patient name, date of birth, surgical site, and procedure verified   Procedure prep:  Patient was prepped and draped in usual sterile fashion Prep type:  Isopropyl alcohol Anesthesia: the lesion was anesthetized in a standard fashion   Anesthetic:  1% lidocaine w/ epinephrine 1-100,000 buffered w/ 8.4% NaHCO3 Instrument used: flexible razor blade   Hemostasis achieved with: pressure, aluminum chloride and electrodesiccation   Outcome: patient tolerated procedure well   Post-procedure details: sterile dressing applied and wound care instructions given   Dressing type: bandage and petrolatum    Specimen 1 - Surgical pathology Differential Diagnosis: R/O skin cancer Check Margins: No 7 x 3.5 cm scar with pinkness  non healing area within scar from previous cancer treatment  AK (actinic keratosis) (20) Face Trunk and Extremities x 20 Cryotherapy today Prior to procedure, discussed risks of blister formation, small wound, skin dyspigmentation, or rare scar following cryotherapy.   Destruction of  lesion - Face Trunk and Extremities x 20 Complexity: simple   Destruction method: cryotherapy   Informed consent: discussed and consent obtained   Timeout:  patient name, date of birth, surgical site, and procedure verified Lesion destroyed using liquid nitrogen:  Yes   Region frozen until ice ball extended beyond lesion: Yes   Outcome: patient tolerated procedure well with no complications   Post-procedure details: wound care instructions given    Inflamed seborrheic keratosis (16) Face, Trunk and Extremities x 16 Cryotherapy today Prior to procedure, discussed risks of blister formation, small wound, skin dyspigmentation, or rare scar following cryotherapy.   Destruction of lesion - Face, Trunk and Extremities x 16 Complexity: simple   Destruction method: cryotherapy   Informed consent: discussed and consent obtained   Timeout:  patient name, date of birth, surgical site, and procedure verified Lesion destroyed using liquid nitrogen: Yes   Region frozen until ice ball extended beyond lesion: Yes   Outcome: patient tolerated procedure well with no complications   Post-procedure details: wound care instructions given    Schamberg's purpura Lower legs Graduated compression stockings recommended. benign-appearing.  Observation.  Call clinic for new or changing lesions.  Recommend daily use of broad spectrum spf 30+ sunscreen to sun-exposed areas.   Lentigines - Scattered tan macules - Discussed due to sun exposure - Benign, observe - Call for any changes  Seborrheic Keratoses - Stuck-on, waxy, tan-brown papules and plaques  - Discussed benign etiology and prognosis. - Observe - Call for any changes  Melanocytic Nevi - Tan-brown and/or pink-flesh-colored symmetric macules and papules - Benign appearing on exam today - Observation - Call clinic for new or changing moles - Recommend daily use of broad spectrum spf 30+ sunscreen to sun-exposed areas.   Hemangiomas - Red papules - Discussed benign nature - Observe - Call for any changes  Actinic Damage - diffuse scaly erythematous macules with underlying dyspigmentation - Recommend daily broad spectrum sunscreen SPF 30+ to sun-exposed areas, reapply every 2 hours as needed.  - Call  for new or changing lesions.  Purpura - Violaceous macules and patches - Benign - Related to age, sun damage and/or use of blood thinners - Observe - Can use OTC arnica containing moisturizer such as Dermend Bruise Formula if desired - Call for worsening or other concerns  Skin cancer screening performed today.  Return in about 6 weeks (around 11/21/2019) for AK Follow up.  Marene Lenz, CMA, am acting as scribe for Sarina Ser, MD . Documentation: I have reviewed the above documentation for accuracy and completeness, and I agree with the above.  Sarina Ser, MD

## 2019-10-10 NOTE — Patient Instructions (Signed)
Recommend daily broad spectrum sunscreen SPF 30+ to sun-exposed areas, reapply every 2 hours as needed. Call for new or changing lesions.  Prior to procedure, discussed risks of blister formation, small wound, skin dyspigmentation, or rare scar following cryotherapy.  Liquid nitrogen was applied for 10-12 seconds to the skin lesion and the expected blistering or scabbing reaction explained. Do not pick at the area. Patient reminded to expect hypopigmented scars from the procedure. Return if lesion fails to fully resolve.  Cryotherapy Aftercare  . Wash gently with soap and water everyday.   . Apply Vaseline and Band-Aid daily until healed.   Wound Care Instructions  1. Cleanse wound gently with soap and water once a day then pat dry with clean gauze. Apply a thing coat of Petrolatum (petroleum jelly, "Vaseline") over the wound (unless you have an allergy to this). We recommend that you use a new, sterile tube of Vaseline. Do not pick or remove scabs. Do not remove the yellow or white "healing tissue" from the base of the wound.  2. Cover the wound with fresh, clean, nonstick gauze and secure with paper tape. You may use Band-Aids in place of gauze and tape if the would is small enough, but would recommend trimming much of the tape off as there is often too much. Sometimes Band-Aids can irritate the skin.  3. You should call the office for your biopsy report after 1 week if you have not already been contacted.  4. If you experience any problems, such as abnormal amounts of bleeding, swelling, significant bruising, significant pain, or evidence of infection, please call the office immediately.  5. FOR ADULT SURGERY PATIENTS: If you need something for pain relief you may take 1 extra strength Tylenol (acetaminophen) AND 2 Ibuprofen (200mg each) together every 4 hours as needed for pain. (do not take these if you are allergic to them or if you have a reason you should not take them.) Typically, you  may only need pain medication for 1 to 3 days.     

## 2019-10-11 ENCOUNTER — Encounter: Payer: Self-pay | Admitting: Dermatology

## 2019-10-12 DIAGNOSIS — J329 Chronic sinusitis, unspecified: Secondary | ICD-10-CM | POA: Diagnosis not present

## 2019-10-12 DIAGNOSIS — J340 Abscess, furuncle and carbuncle of nose: Secondary | ICD-10-CM | POA: Diagnosis not present

## 2019-10-13 ENCOUNTER — Telehealth: Payer: Self-pay

## 2019-10-13 NOTE — Telephone Encounter (Signed)
Left message for patient to call office for results/hd 

## 2019-10-13 NOTE — Telephone Encounter (Signed)
-----   Message from Ralene Bathe, MD sent at 10/12/2019  6:37 PM EDT ----- Skin , right chest pectoral DERMAL SCAR  Benign scar (in site of previous cancer) Recheck next visit

## 2019-10-17 DIAGNOSIS — I4819 Other persistent atrial fibrillation: Secondary | ICD-10-CM | POA: Diagnosis not present

## 2019-10-17 DIAGNOSIS — I208 Other forms of angina pectoris: Secondary | ICD-10-CM | POA: Diagnosis not present

## 2019-10-17 NOTE — Telephone Encounter (Signed)
Patient advised of biopsy results.

## 2019-10-18 DIAGNOSIS — M1712 Unilateral primary osteoarthritis, left knee: Secondary | ICD-10-CM | POA: Diagnosis not present

## 2019-10-19 DIAGNOSIS — E1169 Type 2 diabetes mellitus with other specified complication: Secondary | ICD-10-CM | POA: Diagnosis not present

## 2019-10-19 DIAGNOSIS — E785 Hyperlipidemia, unspecified: Secondary | ICD-10-CM | POA: Diagnosis not present

## 2019-10-19 DIAGNOSIS — E1122 Type 2 diabetes mellitus with diabetic chronic kidney disease: Secondary | ICD-10-CM | POA: Diagnosis not present

## 2019-10-19 DIAGNOSIS — E782 Mixed hyperlipidemia: Secondary | ICD-10-CM | POA: Diagnosis not present

## 2019-10-19 DIAGNOSIS — N183 Chronic kidney disease, stage 3 unspecified: Secondary | ICD-10-CM | POA: Diagnosis not present

## 2019-10-19 DIAGNOSIS — I480 Paroxysmal atrial fibrillation: Secondary | ICD-10-CM | POA: Diagnosis not present

## 2019-10-25 ENCOUNTER — Other Ambulatory Visit: Payer: Self-pay

## 2019-10-25 ENCOUNTER — Ambulatory Visit: Payer: Medicare HMO | Admitting: Pharmacist

## 2019-10-25 DIAGNOSIS — E118 Type 2 diabetes mellitus with unspecified complications: Secondary | ICD-10-CM

## 2019-10-25 DIAGNOSIS — I4819 Other persistent atrial fibrillation: Secondary | ICD-10-CM

## 2019-10-25 NOTE — Chronic Care Management (AMB) (Signed)
Chronic Care Management Pharmacy  Name: Maxwell Stanley  MRN: [id number] DOB: 24-May-1934  Chief Complaint/ HPI  Maxwell Stanley,  85 y.o. , male presents for their Initial CCM visit with the clinical pharmacist In office.  PCP : Glean Hess, MD  Their chronic conditions include: Hypertension, Hyperlipidemia, Diabetes, Atrial Fibrillation, GERD, Chronic Kidney Disease, Osteoarthritis, BPH and Allergic Rhinitis  Office Visits: 09/29/19- Dr. [name] - inc. Metformin for 3 tabs qhs   Consult Visit: 10/19/19- Dr. Nehemiah Massed, Cardiology - can hold Eliquis 3 days prior to surgery TKR 10/04/19- Dr. Marry Guan, Orthopaedics- TKR rt   Medications: Outpatient Encounter Medications as of 10/25/2019  Medication Sig  . acetaminophen (TYLENOL) 500 MG tablet Take 1,000 mg by mouth 2 (two) times daily.   [name] amLODipine (NORVASC) 2.5 MG tablet Take 1 tablet (2.5 mg total) by mouth daily.  [name] atenolol (TENORMIN) 50 MG tablet TAKE 1 TABLET AT BEDTIME  . celecoxib (CELEBREX) 200 MG capsule Take 200 mg by mouth daily.  . cholecalciferol (VITAMIN D) 1000 UNITS tablet Take 1,000 Units by mouth at bedtime.   . clotrimazole (LOTRIMIN) 1 % cream Apply 1 application topically 2 (two) times daily.  [name] docusate sodium (COLACE) 100 MG capsule Take 200 mg by mouth daily.   [name] ELIQUIS 5 MG TABS tablet Take 5 mg by mouth 2 (two) times daily.  . finasteride (PROSCAR) 5 MG tablet TAKE 1 TABLET DAILY  . glucose blood (ONE TOUCH ULTRA TEST) test strip Use to test blood sugar once daily.  . hydrochlorothiazide (MICROZIDE) 12.5 MG capsule TAKE 1 CAPSULE DAILY (IN   PLACE OF COMBINATION       TABLET)  . losartan (COZAAR) 100 MG tablet Take 0.5 tablets (50 mg total) by mouth 2 (two) times daily.  . metFORMIN (GLUCOPHAGE-XR) 500 MG 24 hr tablet Take 3 tablets (1,500 mg total) by mouth at bedtime.  . Misc Natural Products (OSTEO BI-FLEX TRIPLE STRENGTH) TABS Take 1 tablet by mouth 2 (two) times daily.   . Multiple  Vitamins-Minerals (CENTRUM SILVER) tablet Take 0.5 tablets by mouth 2 (two) times daily.   [name] Delica Lancets 56D MISC 1 each by Does not apply route daily. Use to test blood sugar once daily.  . pantoprazole (PROTONIX) 40 MG tablet TAKE 1 TABLET DAILY  . Probiotic Product (PROBIOTIC DAILY PO) Take 1 tablet by mouth daily.  . rosuvastatin (CRESTOR) 20 MG tablet TAKE 1 TABLET AT BEDTIME  . sitaGLIPtin (JANUVIA) 100 MG tablet Take 1 tablet (100 mg total) by mouth daily.  . tamsulosin (FLOMAX) 0.4 MG CAPS capsule Take 1 capsule (0.4 mg total) by mouth daily after breakfast.  . clindamycin (CLEOCIN) 150 MG capsule Take by mouth 3 (three) times daily. Sinus infection. (Patient not taking: Reported on 10/25/2019)  . fluorouracil (EFUDEX) 5 % cream Apply 1 application topically daily as needed. For a week (Patient not taking: Reported on 10/10/2019)  . fluticasone (FLONASE) 50 MCG/ACT nasal spray USE 2 SPRAYS IN EACH       NOSTRIL DAILY  . mupirocin ointment (BACTROBAN) 2 % 1 application 2 (two) times daily.  [name] triamcinolone cream (KENALOG) 0.1 % Apply 1 application topically 2 (two) times daily.   No facility-administered encounter medications on file as of 10/25/2019.    Current Diagnosis/Assessment:    Goals Addressed            This Visit's Progress   . Pharmacy Care plan       CARE PLAN ENTRY (  see longitudinal plan of care for additional care plan information)  Current Barriers:  . Chronic Disease Management support, education, and care coordination needs related to Hypertension, Hyperlipidemia, Diabetes, Atrial Fibrillation, GERD, Chronic Kidney Disease, Osteoarthritis, and BPH   Hypertension/ Atrial fibrillation BP Readings from Last 3 Encounters:  09/29/19 110/68  07/13/19 122/82  05/19/19 136/82   . Pharmacist Clinical Goal(s): o Over the next 90 days, patient will work with PharmD and providers to maintain BP goal <130/80 . Current regimen:  . Amlodipine 2.5 mg  qd . Atenolol 50 mg qhs . Losartan 50 mg bid . HCTZ 12.5 mg qd . Eliquis 5 mg bid (last scr 1.38) . Interventions: o Provided diet and exercise counseling. o Reviewed proper technique for checking BP and encouraged patient to check at home. o Reviewed signs/symptoms of bleeding o Discussed Financial strain of Eliquis co-pay and reviewed PAP approval criteria.  . Patient self care activities - Over the next 90 days, patient will: o Check BP1-2 times weekly, document, and provide at future appointments o Ensure daily salt intake < 2300 mg/day o Avoid NSAIDS o Provide required portion of PAP documentation  Hyperlipidemia Lab Results  Component Value Date/Time   LDLCALC 80 02/02/2019 08:52 AM   . Pharmacist Clinical Goal(s): o Over the next 90 days, patient will work with PharmD and providers to achieve LDL goal < 70 . Current regimen:  o Rosuvastatin 20 mg qd . Interventions: o Provided diet and exercise counseling  o Reviewed HDL/LDL goals . Patient self care activities - Over the next 90 days, patient will: o Strive to exercise 30 min/day 5 days/week  Diabetes Lab Results  Component Value Date/Time   HGBA1C 7.7 (H) 09/29/2019 08:49 AM   HGBA1C 8.3 (H) 05/19/2019 08:26 AM   . Pharmacist Clinical Goal(s): o Over the next 90 days, patient will work with PharmD and providers to achieve A1c goal <7% . Current regimen:  . Metformin XR 1500 mg qhs . Januvia 100 mg qd  . Interventions: o Provided diet and exercise counseling o Reviewed symptoms of hypoglycemia o Discussed financial strain of Januvia co-pay o Discussed benefit side effects of SGLT-2 o Will initiate patient assistance . Patient self care activities - Over the next 90  days, patient will: o Check blood sugar once daily, document, and provide at future appointments o Contact provider with any episodes of hypoglycemia o Provide required portion of PAP  * Medication management . Pharmacist Clinical  Goal(s): o Over the next 90 days, patient will work with PharmD and providers to maintain optimal medication adherence . Current [facility]   . Interventions o Comprehensive medication review performed. o Continue current medication management strategy . Patient self care activities - Over the next 90 days, patient will: o Focus on medication adherence by fill dates  o Take medications as prescribed o Report any questions or concerns to PharmD and/or provider(s)  Initial goal documentation         Diabetes   A1c goal <7%  Recent Relevant Labs: Lab Results  Component Value Date/Time   HGBA1C 7.7 (H) 09/29/2019 08:49 AM   HGBA1C 8.3 (H) 05/19/2019 08:26 AM    Last diabetic Eye exam:  Lab Results  Component Value Date/Time   HMDIABEYEEXA No Retinopathy 09/02/2017 12:00 AM    Last diabetic Foot exam: No results found for: HMDIABFOOTEX   Checking BG: Daily  Most recent eGFR`96m/min  Recent FBG Readings: 140-175  Patient has failed these meds in past:  NA Patient is currently uncontrolled on the following medications: [name] Metformin XR 1500 mg qhs . Januvia 100 mg qd   We discussed: diet and exercise extensively. Patient and spouse prepare most meals at home and do not follow any dietary restrictions other than moderation. He has recently started exercising on recumbent bicycle several days/week for 25-30 minutes.Denies any episodes of hypoglycemia. He is planning to have TKR for arthritis but does not have a date. His A1c must be <8.0 per surgeon. He states Januvia is expensive $393/ 90 day supply when in donut hole. We discussed potential for switching to SGLT-2 for improved glucose control and renal  protective effects. Patient wishes to avoid injections if possible.Patient concerned about cost of newer agents. Will discuss potential for changing agents with PCP and initiate PAP as indicated.  Plan  Consider changing to SGLT-2 for potential improved glucose  control and renal protection.   Hypertension   BP goal is:  <130/80  Office blood pressures are  BP Readings from Last 3 Encounters:  09/29/19 110/68  07/13/19 122/82  05/19/19 136/82   BMP Latest Ref Rng & Units 05/19/2019 02/02/2019 09/28/2018  Glucose 65 - 99 mg/dL 193(H) 149(H) -  BUN 8 - 27 mg/dL 27 24 -  Creatinine 0.76 - 1.27 mg/dL 1.38(H) 1.36(H) -  BUN/Creat Ratio 10 - 24 20 18  -  Sodium 134 - 144 mmol/L 141 141 -  Potassium 3.5 - 5.2 mmol/L 4.7 4.4 4.1  Chloride 96 - 106 mmol/L 102 100 -  CO2 20 - 29 mmol/L 22 25 -  Calcium 8.6 - 10.2 mg/dL 9.9 9.7 -    Patient checks BP at home Not checking  Patient has failed these meds in the past: NA Patient is currently controlled on the following medications:  . Amlodipine 2.5 mg qd . Atenolol 50 mg qhs . Losartan 50 mg bid . HCTZ 12.5 mg qd  We discussed diet and exercise extensively. Patient has a BP cuff at home but not checking. He is followed by caridology. Denies any dizziness/light-headedness. Recommend checking BP occasionally at home.  Plan  Continue current medications     Hyperlipidemia   LDL goal < 70  Lipid Panel     Component Value Date/Time   CHOL 147 02/02/2019 0852   TRIG 179 (H) 02/02/2019 0852   HDL 36 (L) 02/02/2019 0852   LDLCALC 80 02/02/2019 0852    Hepatic Function Latest Ref Rng & Units 02/02/2019 12/22/2017 12/11/2016  Total Protein 6.0 - 8.5 g/dL 7.3 6.7 6.6  Albumin 3.6 - 4.6 g/dL 4.8(H) 4.6 4.3  AST 0 - 40 IU/L 25 20 20   ALT 0 - 44 IU/L 29 19 17   Alk Phosphatase 39 - 117 IU/L 89 81 84  Total Bilirubin 0.0 - 1.2 mg/dL 0.8 0.6 0.6     The ASCVD Risk score (Reinholds., et al., 2013) failed to calculate for the following reasons:   The 2013 ASCVD risk score is only valid for ages 71 to 34   Patient has failed these meds in past: NA Patient is currentlyquery  controlled on the following medications:  . Rosuvastatin 20 mg qd  We discussed:  Patient tolerating well. Discussed diet  and exercise. Discussed ldl and hdl goals.  Plan  Continue current medications  AFIB   Patient is currently rate controlled. Office heart rates are  Pulse Readings from Last 3 Encounters:  09/29/19 66  07/13/19 77  05/19/19 72    CHA2DS2-VASc Score =  4  The patient's score is based upon: CHF History: 0 HTN History: 1 Diabetes History: 1 Stroke History: 0 Vascular Disease History: 0 Age Score: 2 Gender Score: 0   The patient is at significant risk for stroke/thromboembolism based upon his CHA2DS2-VASc Score of 4.  Continue Apixaban (Eliquis) 5 mg bid (last Scr 1.38)   We discussed:  Signs and symptoms of bleeding, patient denies. At 10/17/19 caridology appt. No a fib noted on stress test.  Patient states copayment is expensive for Eliquis. Will ask CPA to initiate patient assistance paperwork. Patient aware he will need to provide proof of income and year to date out of pocket prescription expenses.  Plan  Continue current medications. Initiate PAP for Eliquis.  BPH   PSA  Date Value Ref Range Status  04/10/2017 1.83  Final     Patient has failed these meds in past: NA Patient is currently controlled on the following medications:  . Finasteride 5 mg qd . tamsulosin 0.4 mg qd  We discussed:  Patient reports night time urination.  Plan  Continue current medications   Osteoarthritis   Patient has failed these meds in past: NA Patient is currentlyquery  controlled on the following medications:  . Celebrex 200 mg qd . Acetaminophen 1000 mg prn pain . Osteo-Biflex 1 tab bid  We discussed:  Patient is awaiting a surgery date for rt TKR. He reports receiving periodic "gel" or steroid  injections in knees bilaterally.   Plan  Continue current medications    GERD   Patient has failed these meds in past: NA Patient is currently controlled on the following medications:  . Protonix 40 mg qd  We discussed:  Patient denies symptoms. Will discuss further at  follow up.  Plan  Continue current medications    Health Maintenance  No results found for: VD25OH   . Vitamin D 1000 units daily . Docusate sodium 200 mg daily . Probiotic daily . Multivitamin senior daily . Osteo Biflex 1 tab bid . Clotrimazole to feet as needed  We discussed:  Patient reports nephrology starting Vit d supplementation several years earlier. He does not follow with them. No level in chart.  Plan  Continue current supplements. Recommend follow- up Vitamin D and Vit B12 (due to long term metformin) levels with next labs.   Vaccines   Reviewed and discussed patient's vaccination history.    Immunization History  Administered Date(s) Administered  . Fluad Quad(high Dose 65+) 09/16/2018, 09/27/2019  . Influenza, High Dose Seasonal PF 10/13/2016, 09/09/2017  . Influenza,inj,Quad PF,6+ Mos 09/26/2014  . Influenza-Unspecified 09/26/2014, 10/13/2016  . PFIZER SARS-COV-2 Vaccination 01/22/2019, 02/13/2019  . Pneumococcal Conjugate-13 10/26/2013  . Pneumococcal Polysaccharide-23 01/31/2011  . Tdap 10/03/2010  . Zoster 04/16/2000  . Zoster Recombinat (Shingrix) 08/12/2017, 10/22/2017    Plan  Vaccines recommendations up to date.  Medication Management   [facility] Uses pill box? No - has a tote with vials labeled AM/PM on tops. Pt endorses 95% compliance  We discussed: Current pharmacy is preferred with insurance plan and patient is satisfied with pharmacy services  Plan  Continue current medication management strategy    Follow up: 3  month phone visit  Junita Push. Kenton Kingfisher PharmD, [facility] [phone]

## 2019-10-25 NOTE — Patient Instructions (Addendum)
Visit Information  It was a pleasure speaking with you today! Thank you for letting me be a part of your care team. Please call with any questions or concerns.  Goals Addressed            This Visit's Progress   . Pharmacy Care plan       CARE PLAN ENTRY (see longitudinal plan of care for additional care plan information)  Current Barriers:  . Chronic Disease Management support, education, and care coordination needs related to Hypertension, Hyperlipidemia, Diabetes, Atrial Fibrillation, GERD, Chronic Kidney Disease, Osteoarthritis, and BPH   Hypertension/ Atrial fibrillation BP Readings from Last 3 Encounters:  09/29/19 110/68  07/13/19 122/82  05/19/19 136/82   . Pharmacist Clinical Goal(s): o Over the next 90 days, patient will work with PharmD and providers to maintain BP goal <130/80 . Current regimen:  . Amlodipine 2.5 mg qd . Atenolol 50 mg qhs . Losartan 50 mg bid . HCTZ 12.5 mg qd . Eliquis 5 mg bid (last scr 1.38) . Interventions: o Provided diet and exercise counseling. o Reviewed proper technique for checking BP and encouraged patient to check at home. o Reviewed signs/symptoms of bleeding o Discussed Financial strain of Eliquis co-pay and reviewed PAP approval criteria.  . Patient self care activities - Over the next 90 days, patient will: o Check BP1-2 times weekly, document, and provide at future appointments o Ensure daily salt intake < 2300 mg/day o Avoid NSAIDS o Provide required portion of PAP documentation  Hyperlipidemia Lab Results  Component Value Date/Time   LDLCALC 80 02/02/2019 08:52 AM   . Pharmacist Clinical Goal(s): o Over the next 90 days, patient will work with PharmD and providers to achieve LDL goal < 70 . Current regimen:  o Rosuvastatin 20 mg qd . Interventions: o Provided diet and exercise counseling  o Reviewed HDL/LDL goals . Patient self care activities - Over the next 90 days, patient will: o Strive to exercise 30 min/day  5 days/week  Diabetes Lab Results  Component Value Date/Time   HGBA1C 7.7 (H) 09/29/2019 08:49 AM   HGBA1C 8.3 (H) 05/19/2019 08:26 AM   . Pharmacist Clinical Goal(s): o Over the next 90 days, patient will work with PharmD and providers to achieve A1c goal <7% . Current regimen:  . Metformin XR 1500 mg qhs . Januvia 100 mg qd  . Interventions: o Provided diet and exercise counseling o Reviewed symptoms of hypoglycemia o Discussed financial strain of Januvia co-pay o Discussed benefit side effects of SGLT-2 o Will initiate patient assistance . Patient self care activities - Over the next 90  days, patient will: o Check blood sugar once daily, document, and provide at future appointments o Contact provider with any episodes of hypoglycemia o Provide required portion of PAP  * Medication management . Pharmacist Clinical Goal(s): o Over the next 90 days, patient will work with PharmD and providers to maintain optimal medication adherence . Current pharmacy: [facility]   . Interventions o Comprehensive medication review performed. o Continue current medication management strategy . Patient self care activities - Over the next 90 days, patient will: o Focus on medication adherence by fill dates  o Take medications as prescribed o Report any questions or concerns to PharmD and/or provider(s)  Initial goal documentation        Maxwell Stanley was given information about Chronic Care Management services today including:  1. CCM service includes personalized support from designated clinical staff supervised by his physician, including  individualized plan of care and coordination with other care providers 2. 24/7 contact phone numbers for assistance for urgent and routine care needs. 3. Standard insurance, coinsurance, copays and deductibles apply for chronic care management only during months in which we provide at least 20 minutes of these services. Most insurances cover these  services at 100%, however patients may be responsible for any copay, coinsurance and/or deductible if applicable. This service may help you avoid the need for more expensive face-to-face services. 4. Only one practitioner may furnish and bill the service in a calendar month. 5. The patient may stop CCM services at any time (effective at the end of the month) by phone call to the office staff.  Patient agreed to services and verbal consent obtained.   The patient verbalized understanding of instructions provided today and agreed to receive a mailed copy of patient instruction and/or educational materials. Telephone follow up appointment with pharmacy team member scheduled for: 3 months  Junita Push. Eligha Kmetz PharmD, BCPS Clinical Pharmacist [phone]  High Cholesterol  High cholesterol is a condition in which the blood has high levels of a white, waxy, fat-like substance (cholesterol). The human body needs small amounts of cholesterol. The liver makes all the cholesterol that the body needs. Extra (excess) cholesterol comes from the food that we eat. Cholesterol is carried from the liver by the blood through the blood vessels. If you have high cholesterol, deposits (plaques) may build up on the walls of your blood vessels (arteries). Plaques make the arteries narrower and stiffer. Cholesterol plaques increase your risk for heart attack and stroke. Work with your health care provider to keep your cholesterol levels in a healthy range. What increases the risk? This condition is more likely to develop in people who:  Eat foods that are high in animal fat (saturated fat) or cholesterol.  Are overweight.  Are not getting enough exercise.  Have a family history of high cholesterol. What are the signs or symptoms? There are no symptoms of this condition. How is this diagnosed? This condition may be diagnosed from the results of a blood test.  If you are older than age 54, your health care  provider may check your cholesterol every 4-6 years.  You may be checked more often if you already have high cholesterol or other risk factors for heart disease. The blood test for cholesterol measures:  "Bad" cholesterol (LDL cholesterol). This is the main type of cholesterol that causes heart disease. The desired level for LDL is less than 100.  "Good" cholesterol (HDL cholesterol). This type helps to protect against heart disease by cleaning the arteries and carrying the LDL away. The desired level for HDL is 60 or higher.  Triglycerides. These are fats that the body can store or burn for energy. The desired number for triglycerides is lower than 150.  Total cholesterol. This is a measure of the total amount of cholesterol in your blood, including LDL cholesterol, HDL cholesterol, and triglycerides. A healthy number is less than 200. How is this treated? This condition is treated with diet changes, lifestyle changes, and medicines. Diet changes  This may include eating more whole grains, fruits, vegetables, nuts, and fish.  This may also include cutting back on red meat and foods that have a lot of added sugar. Lifestyle changes  Changes may include getting at least 40 minutes of aerobic exercise 3 times a week. Aerobic exercises include walking, biking, and swimming. Aerobic exercise along with a healthy diet can help you  maintain a healthy weight.  Changes may also include quitting smoking. Medicines  Medicines are usually given if diet and lifestyle changes have failed to reduce your cholesterol to healthy levels.  Your health care provider may prescribe a statin medicine. Statin medicines have been shown to reduce cholesterol, which can reduce the risk of heart disease. Follow these instructions at home: Eating and drinking If told by your health care provider:  Eat chicken (without skin), fish, veal, shellfish, ground [place] breast, and round or loin cuts of red meat.  Do  not eat fried foods or fatty meats, such as hot dogs and salami.  Eat plenty of fruits, such as apples.  Eat plenty of vegetables, such as broccoli, potatoes, and carrots.  Eat beans, peas, and lentils.  Eat grains such as barley, rice, couscous, and bulgur wheat.  Eat pasta without cream sauces.  Use skim or nonfat milk, and eat low-fat or nonfat yogurt and cheeses.  Do not eat or drink whole milk, cream, ice cream, egg yolks, or hard cheeses.  Do not eat stick margarine or tub margarines that contain trans fats (also called partially hydrogenated oils).  Do not eat saturated tropical oils, such as coconut oil and palm oil.  Do not eat cakes, cookies, crackers, or other baked goods that contain trans fats.  General instructions  Exercise as directed by your health care provider. Increase your activity level with activities such as gardening, walking, and taking the stairs.  Take over-the-counter and prescription medicines only as told by your health care provider.  Do not use any products that contain nicotine or tobacco, such as cigarettes and e-cigarettes. If you need help quitting, ask your health care provider.  Keep all follow-up visits as told by your health care provider. This is important. Contact a health care provider if:  You are struggling to maintain a healthy diet or weight.  You need help to start on an exercise program.  You need help to stop smoking. Get help right away if:  You have chest pain.  You have trouble breathing. This information is not intended to replace advice given to you by your health care provider. Make sure you discuss any questions you have with your health care provider. Document Revised: 12/26/2016 Document Reviewed: 06/23/2015 Elsevier Patient Education  [employer].

## 2019-10-26 DIAGNOSIS — M1711 Unilateral primary osteoarthritis, right knee: Secondary | ICD-10-CM | POA: Diagnosis not present

## 2019-11-15 IMAGING — MR MR HIP*L* W/O CM
5 of 8 series · 21 of 40 positions shown · non-contrast
Comparison: None.

CLINICAL DATA: Bilateral hip pain. Pain since using an exercise
bike around [REDACTED].

EXAM:
MR OF THE RIGHT HIP WITHOUT CONTRAST, MRI OF THE LEFT HIP WITHOUT
CONTRAST
TECHNIQUE: Multiplanar, multisequence MR imaging was performed. No intravenous
contrast was administered.

[Series 3: T1 · coronal · 4.0mm · 0.89mm/px · 4 of 39 slices shown]
[im 1/39]
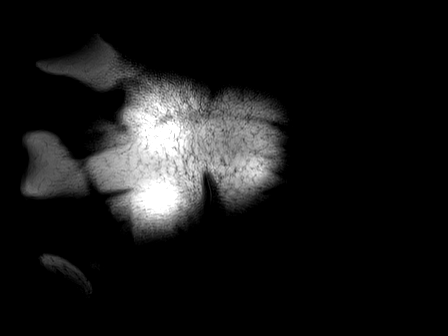
[im 7/39]
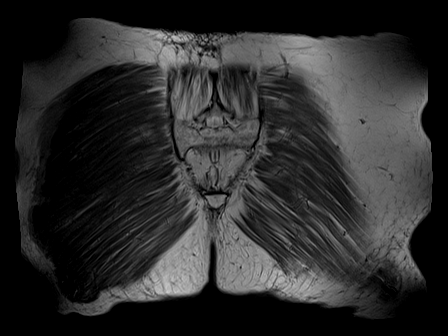
[im 13/39]
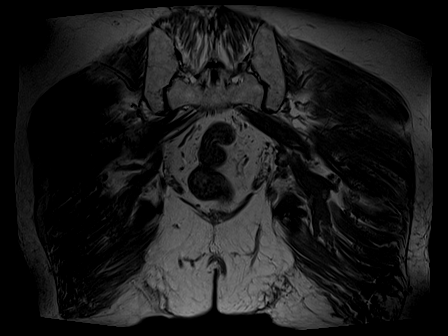
[im 20/39]
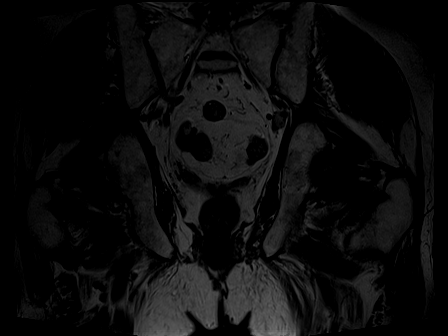

[Series 6: PD fat-sat · sagittal · 4.5mm · 0.37mm/px · 5 of 29 slices shown (1 of 4)]
[im 1/29]
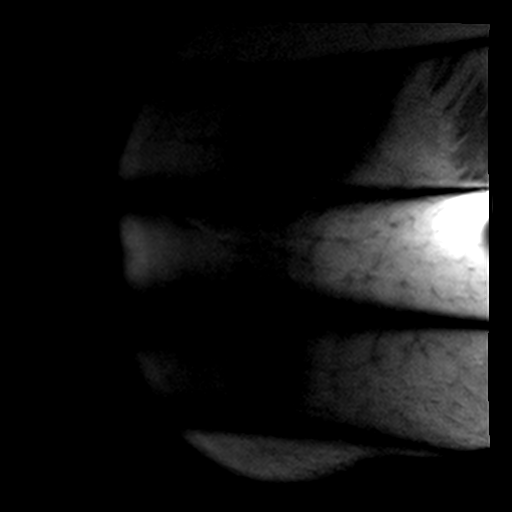
[im 8/29]
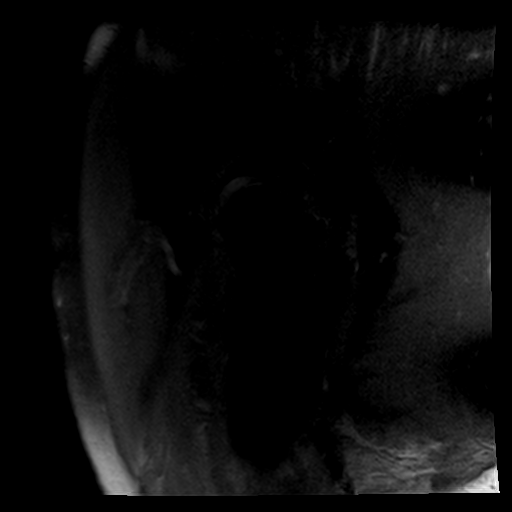
[im 15/29]
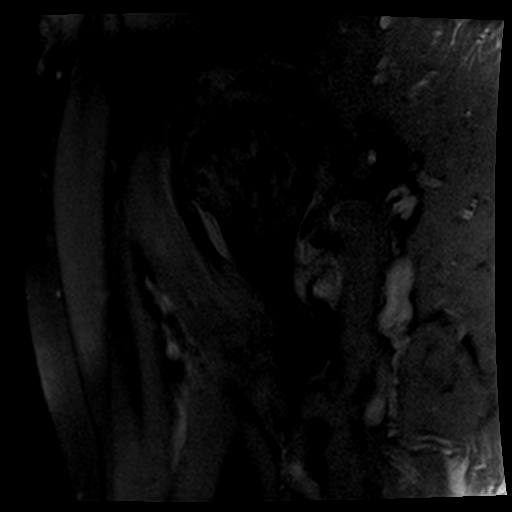
[im 22/29]
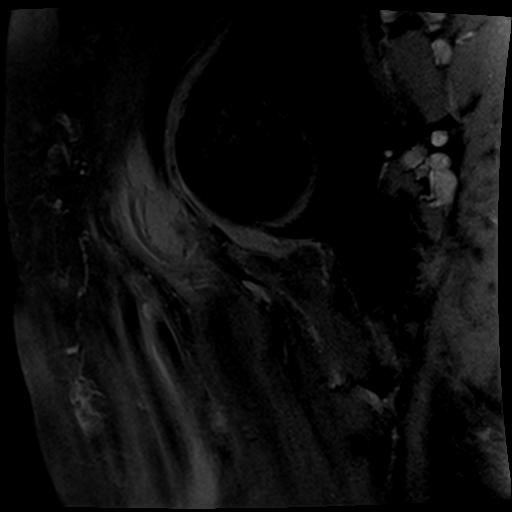
[im 29/29]
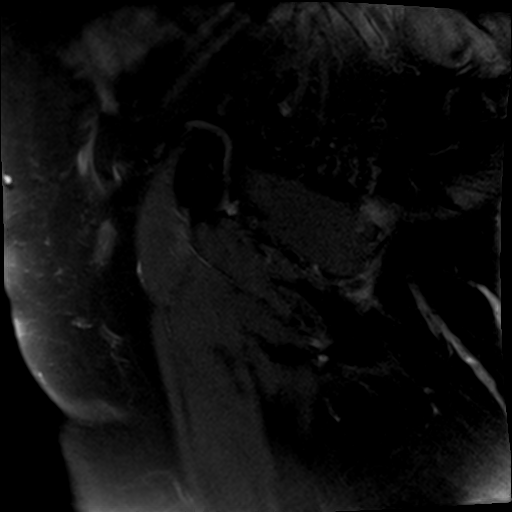

[Series 7: PD fat-sat · coronal · 4.5mm · 0.37mm/px · 4 of 25 slices shown (2 of 4)]
[im 1/25]
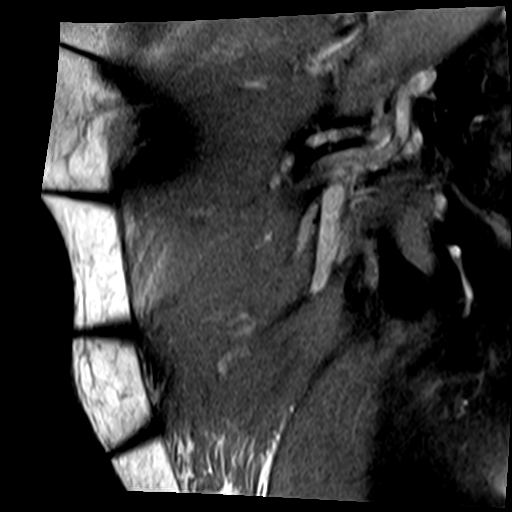
[im 9/25]
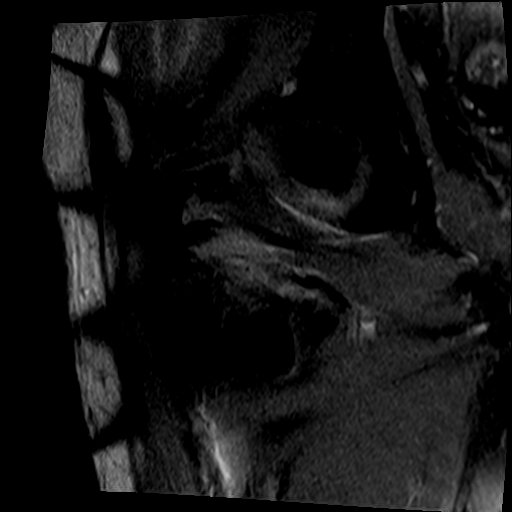
[im 17/25]
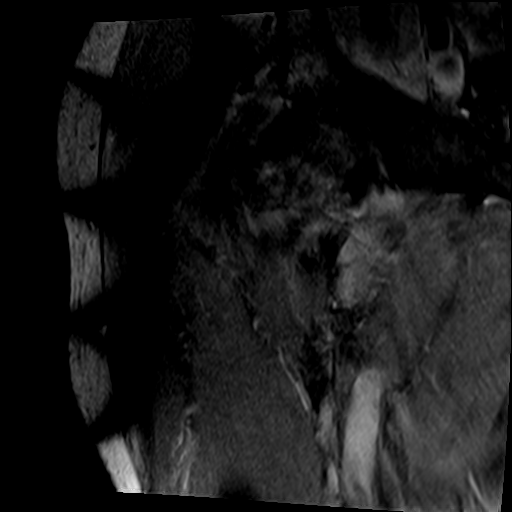
[im 25/25]
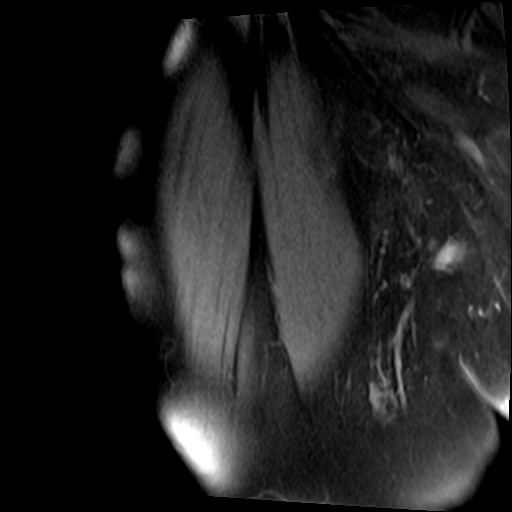

[Series 9: PD fat-sat · sagittal · 4.5mm · 0.37mm/px · 4 of 28 slices shown (3 of 4)]
[im 1/28]
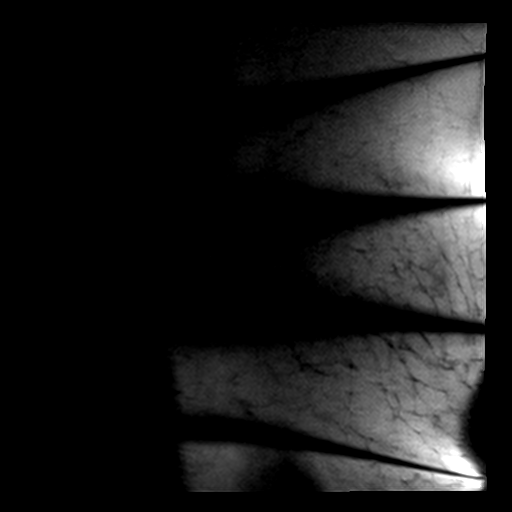
[im 10/28]
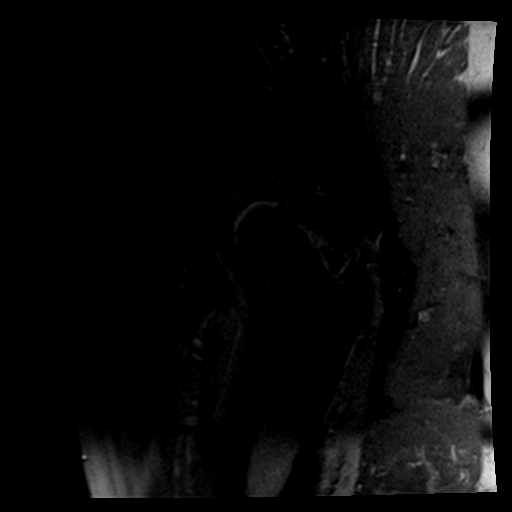
[im 19/28]
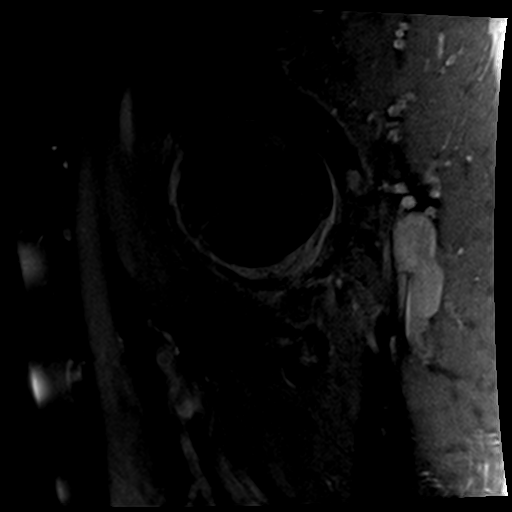
[im 28/28]
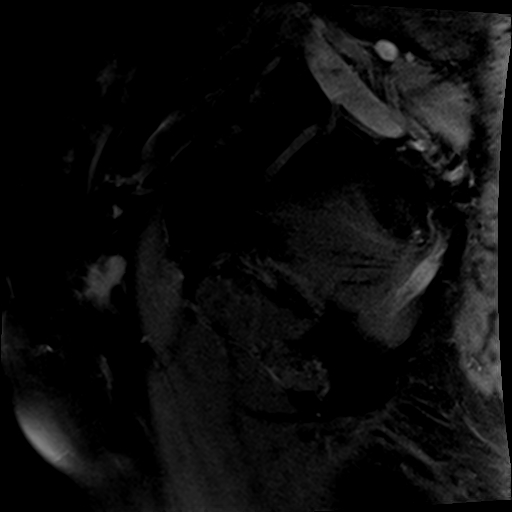

[Series 10: PD fat-sat · coronal · 4.5mm · 0.37mm/px · 4 of 26 slices shown (4 of 4)]
[im 1/26]
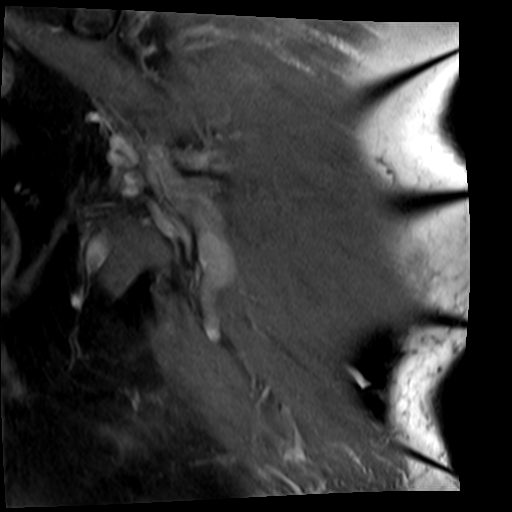
[im 9/26]
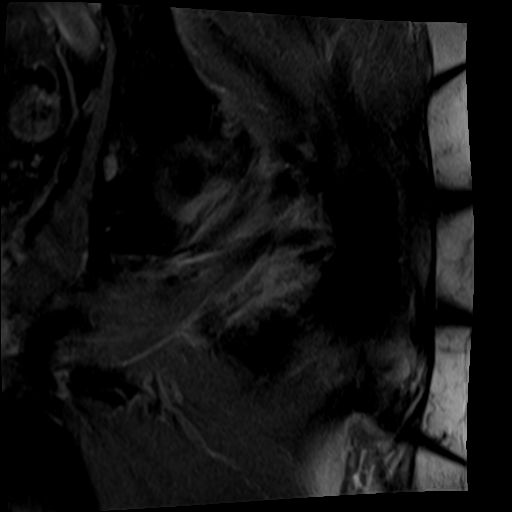
[im 17/26]
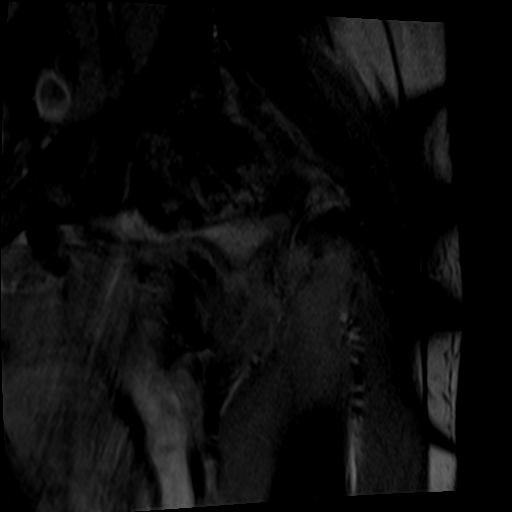
[im 26/26]
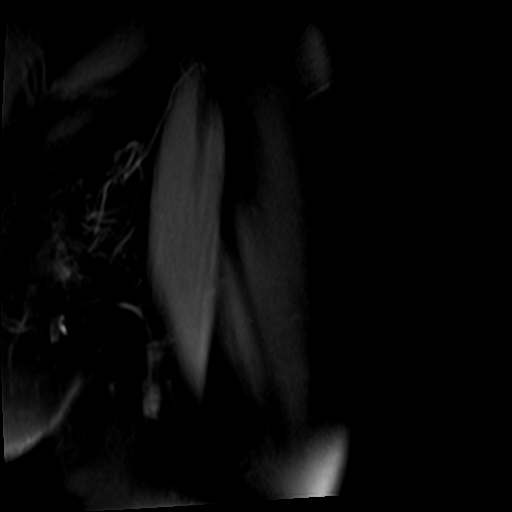

[21 of 40 positions shown; findings below may reference images not displayed]

FINDINGS: Severe/advanced degenerative changes involving both hips with marked
joint space narrowing, full-thickness cartilage loss, osteophytic
spurring, subchondral cystic change and marrow edema/osteitis. The
labrum are degenerated and torn also. Moderate-sized joint effusions
and moderate synovitis.

There are also subchondral stress fractures involving the femoral
heads with slight flattening and marked marrow edema extending all
the way down into the femoral neck is. This is more pronounced on
the right side.

The pubic symphysis and SI joints are intact. Mild degenerative
changes. No pelvic fractures or bone lesions.

Mild edema like signal abnormality in the adductor muscles
bilaterally suggesting muscle strains. There is also bilateral hip
peritendinitis but no trochanteric bursitis.

Iliopsoas bursitis is noted on the right side.

No significant intrapelvic abnormalities are identified. The
prostate gland is moderately enlarged.
IMPRESSION: 1. Severe bilateral hip osteoarthritis.
2. Subchondral stress/insufficiency fractures involving both femoral
heads with associated marked marrow edema extending down to the
femoral necks.
3. Adductor muscle strains bilaterally.
4. Intact bony pelvis.

## 2019-11-22 DIAGNOSIS — R69 Illness, unspecified: Secondary | ICD-10-CM | POA: Diagnosis not present

## 2019-11-23 ENCOUNTER — Ambulatory Visit (INDEPENDENT_AMBULATORY_CARE_PROVIDER_SITE_OTHER): Payer: Medicare HMO

## 2019-11-23 ENCOUNTER — Other Ambulatory Visit: Payer: Self-pay

## 2019-11-23 VITALS — BP 108/62 | HR 76 | Temp 97.8°F | Resp 16 | Ht 74.0 in | Wt 193.6 lb

## 2019-11-23 DIAGNOSIS — Z0000 Encounter for general adult medical examination without abnormal findings: Secondary | ICD-10-CM

## 2019-11-23 DIAGNOSIS — R69 Illness, unspecified: Secondary | ICD-10-CM | POA: Diagnosis not present

## 2019-11-23 NOTE — Progress Notes (Signed)
Subjective:   Maxwell Stanley is a 85 y.o. male who presents for Medicare Annual/Subsequent preventive examination.  Review of Systems     Cardiac Risk Factors include: advanced age (>29men, >41 women);diabetes mellitus;dyslipidemia;male gender;hypertension     Objective:    Today's Vitals   11/23/19 1452  BP: 108/62  Pulse: 76  Resp: 16  Temp: 97.8 F (36.6 C)  TempSrc: Oral  SpO2: 96%  Weight: 193 lb 9.6 oz (87.8 kg)  Height: 6\' 2"  (1.88 m)  PainSc: 5    Body mass index is 24.86 kg/m.  Advanced Directives 11/23/2019 11/22/2018 11/12/2018 08/16/2018 09/09/2017 08/25/2016 08/13/2015  Does Patient Have a Medical Advance Directive? Yes Yes Yes No Yes Yes Yes  Type of Paramedic of [place];Living will [place];Living will Living will - [place];Living will [place];Living will [place];Living will  Does patient want to make changes to medical advance directive? - - No - Patient declined - - - -  Copy of [place] in Chart? No - copy requested Yes - validated most recent copy scanned in chart (See row information) - - No - copy requested No - copy requested -  Would patient like information on creating a medical advance directive? - - No - Patient declined - - - -    Current Medications (verified) Outpatient Encounter Medications as of 11/23/2019  Medication Sig  . acetaminophen (TYLENOL) 500 MG tablet Take 1,000 mg by mouth 2 (two) times daily.   [name] amLODipine (NORVASC) 2.5 MG tablet Take 1 tablet (2.5 mg total) by mouth daily.  [name] atenolol (TENORMIN) 50 MG tablet TAKE 1 TABLET AT BEDTIME  . celecoxib (CELEBREX) 200 MG capsule Take 200 mg by mouth 2 (two) times daily. Dr. Marry Guan  . cholecalciferol (VITAMIN D) 1000 UNITS tablet Take 1,000 Units by mouth at bedtime.   . clotrimazole (LOTRIMIN) 1 % cream Apply 1 application topically 2 (two) times daily.  [name]  docusate sodium (COLACE) 100 MG capsule Take 200 mg by mouth daily.   [name] ELIQUIS 5 MG TABS tablet Take 5 mg by mouth 2 (two) times daily.  . finasteride (PROSCAR) 5 MG tablet TAKE 1 TABLET DAILY  . fluorouracil (EFUDEX) 5 % cream Apply 1 application topically daily as needed. For a week  . fluticasone (FLONASE) 50 MCG/ACT nasal spray USE 2 SPRAYS IN EACH       NOSTRIL DAILY  . glucose blood (ONE TOUCH ULTRA TEST) test strip Use to test blood sugar once daily.  . hydrochlorothiazide (MICROZIDE) 12.5 MG capsule TAKE 1 CAPSULE DAILY (IN   PLACE OF COMBINATION       TABLET)  . losartan (COZAAR) 100 MG tablet Take 0.5 tablets (50 mg total) by mouth 2 (two) times daily.  . metFORMIN (GLUCOPHAGE-XR) 500 MG 24 hr tablet Take 3 tablets (1,500 mg total) by mouth at bedtime.  . Misc Natural Products (OSTEO BI-FLEX TRIPLE STRENGTH) TABS Take 1 tablet by mouth 2 (two) times daily.   . Multiple Vitamins-Minerals (CENTRUM SILVER) tablet Take 0.5 tablets by mouth 2 (two) times daily.   [name] Delica Lancets 74Q MISC 1 each by Does not apply route daily. Use to test blood sugar once daily.  . pantoprazole (PROTONIX) 40 MG tablet TAKE 1 TABLET DAILY  . Probiotic Product (PROBIOTIC DAILY PO) Take 1 tablet by mouth daily.  . rosuvastatin (CRESTOR) 20 MG tablet TAKE 1 TABLET AT BEDTIME  .  sitaGLIPtin (JANUVIA) 100 MG tablet Take 1 tablet (100 mg total) by mouth daily.  . tamsulosin (FLOMAX) 0.4 MG CAPS capsule Take 1 capsule (0.4 mg total) by mouth daily after breakfast.  . triamcinolone cream (KENALOG) 0.1 % Apply 1 application topically 2 (two) times daily.  . [DISCONTINUED] clindamycin (CLEOCIN) 150 MG capsule Take by mouth 3 (three) times daily. Sinus infection. (Patient not taking: Reported on 10/25/2019)  . [DISCONTINUED] mupirocin ointment (BACTROBAN) 2 % 1 application 2 (two) times daily.   No facility-administered encounter medications on file as of 11/23/2019.    Allergies (verified) Codeine,  Ceftriaxone, and Penicillin g   History: Past Medical History:  Diagnosis Date  . A-fib ([place])   . Allergy unknown  . BPH (benign prostatic hypertrophy)    with BOO  . Cancer ([place])    skin cancers  . Carpal tunnel syndrome on left   . Cataract nov.2020  . CKD stage 3 secondary to diabetes ([place])   . Complication of anesthesia   . DDD (degenerative disc disease), lumbar   . Degenerative arthritis of finger   . Diabetes mellitus without complication ([place]) 7169  . Foot pain, left 04/17/2014  . GERD (gastroesophageal reflux disease)   . Hypertension   . Nocturia   . Organic impotence   . PONV (postoperative nausea and vomiting)   . Primary osteoarthritis of both knees   . Primary osteoarthritis of right hip   . 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 Urinary frequency    Past Surgical History:  Procedure Laterality Date  . ANKLE ARTHROSCOPY WITH OPEN REDUCTION INTERNAL FIXATION (ORIF) Left 1998  . HEMORRHOID SURGERY    . HERNIA REPAIR Left 6789   umbilical and left inguinal  . INGUINAL HERNIA REPAIR Right 11/12/2018   Procedure: HERNIA REPAIR INGUINAL ADULT;  Surgeon: Fredirick Maudlin, MD;  Location: [facility] ORS;  Service: General;  Laterality: Right;  . KNEE ARTHROSCOPY Right 2006  . skin cancer resection Right 04/07/2018   ON hand.  [name] THUMB ARTHROSCOPY Right 2013   tendon flap to joint   Family History  Problem Relation Age of Onset  . Diabetes Mother   . Coronary artery disease Father   . Hypertension Father   . Hyperlipidemia Father   . Leukemia Father    Social History   Socioeconomic History  . Marital status: Married    Spouse name: Vaughan Basta  . Number of children: 2  . Years of education: some college  . Highest education level: 12th grade  Occupational History  . Occupation: Retired  Tobacco Use  . Smoking status: Never Smoker  . Smokeless tobacco: Never Used  . Tobacco comment: smoking cessation materials not required  Vaping Use  . Vaping  Use: Never used  Substance and Sexual Activity  . Alcohol use: No    Alcohol/week: 0.0 standard drinks  . Drug use: No  . Sexual activity: Not Currently    Birth control/protection: None  Other Topics Concern  . Not on file  Social History Narrative   Caffeine in moderation. Pt stays active working on [employer] and mows 3 yards per week   Social Determinants of Health   Financial Resource Strain: Low Risk   . Difficulty of Paying Living Expenses: Not hard at all  Food Insecurity: No Food Insecurity  . Worried About Charity fundraiser in the Last Year: Never true  . Ran Out of Food in the Last Year: Never true  Transportation Needs: No Transportation Needs  . Lack of Transportation (Medical): No  . Lack of Transportation (Non-Medical): No  Physical Activity: Inactive  . Days of Exercise per Week: 0 days  . Minutes of Exercise per Session: 0 min  Stress: No Stress Concern Present  . Feeling of Stress : Not at all  Social Connections: Socially Integrated  . Frequency of Communication with Friends and Family: More than three times a week  . Frequency of Social Gatherings with Friends and Family: More than three times a week  . Attends Religious Services: More than 4 times per year  . Active Member of Clubs or Organizations: Yes  . Attends Archivist Meetings: More than 4 times per year  . Marital Status: Married    Tobacco Counseling Counseling given: Not Answered Comment: smoking cessation materials not required   Clinical Intake:  Pre-visit preparation completed: Yes  Pain : 0-10 Pain Score: 5  Pain Type: Chronic pain Pain Location: Knee Pain Orientation: Right Pain Descriptors / Indicators: Constant, Discomfort, Aching Pain Onset: More than a month ago Pain Frequency: Constant     BMI - recorded: 24.86 Nutritional Status: BMI of 19-24  Normal Nutritional Risks: None Diabetes: Yes CBG done?: No Did pt. bring in CBG monitor from home?: No  How  often do you need to have someone help you when you read instructions, pamphlets, or other written materials from your doctor or pharmacy?: 1 - Never  Nutrition Risk Assessment:  Has the patient had any N/V/D within the last 2 months?  No  Does the patient have any non-healing wounds?  No  Has the patient had any unintentional weight loss or weight gain?  No   Diabetes:  Is the patient diabetic?  Yes  If diabetic, was a CBG obtained today?  No  Did the patient bring in their glucometer from home?  No  How often do you monitor your CBG's? daily.   Financial Strains and Diabetes Management:  Are you having any financial strains with the device, your supplies or your medication? No .  Does the patient want to be seen by Chronic Care Management for management of their diabetes?  No  Would the patient like to be referred to a Nutritionist or for Diabetic Management?  No   Diabetic Exams:  Diabetic Eye Exam: Completed 09/02/17 negative retinopathy. Overdue for diabetic eye exam. Scheduled for 12/10/19 with Dr. Mallie Mussel  Diabetic Foot Exam: Completed 02/02/19.    Interpreter Needed?: No  Information entered by :: Clemetine Marker LPN   Activities of Daily Living In your present state of health, do you have any difficulty performing the following activities: 11/23/2019  Hearing? N  Comment declines hearing aids  Vision? N  Difficulty concentrating or making decisions? N  Walking or climbing stairs? Y  Dressing or bathing? N  Doing errands, shopping? N  Preparing Food and eating ? N  Using the Toilet? N  In the past six months, have you accidently leaked urine? N  Do you have problems with loss of bowel control? N  Managing your Medications? N  Managing your Finances? N  Housekeeping or managing your Housekeeping? N  Some recent data might be hidden    Patient Care Team: Glean Hess, MD as PCP - General (Internal Medicine) Jannet Mantis, MD as Consulting Physician  (Dermatology) Leanor Kail, MD (Inactive) as Consulting Physician (Orthopedic Surgery) Baker Pierini, OD as Consulting Physician (Optometry) Reche Dixon, PA-C as Consulting Physician (Orthopedic  Surgery) Corey Skains, MD as Consulting Physician (Cardiology) Vladimir Faster, [facility] (Pharmacist)  Indicate any recent Medical Services you may have received from other than [facility] providers in the past year (date may be approximate).     Assessment:   This is a routine wellness examination for Maxwell Stanley.  Hearing/Vision screen  Hearing Screening   125Hz  250Hz  500Hz  1000Hz  2000Hz  3000Hz  4000Hz  6000Hz  8000Hz   Right ear:           Left ear:           Comments: Pt denies hearing difficulty  Vision Screening Comments: Annual vision screenings done with Dr. Juanda Crumble  Dietary issues and exercise activities discussed: Current Exercise Habits: The patient does not participate in regular exercise at present, Exercise limited by: orthopedic condition(s)  Goals    . DIET - INCREASE WATER INTAKE     Recommend to drink at least 6-8 8oz glasses of water per day.    [name] Pharmacy Care plan     CARE PLAN ENTRY (see longitudinal plan of care for additional care plan information)  Current Barriers:  . Chronic Disease Management support, education, and care coordination needs related to Hypertension, Hyperlipidemia, Diabetes, Atrial Fibrillation, GERD, Chronic Kidney Disease, Osteoarthritis, and BPH   Hypertension/ Atrial fibrillation BP Readings from Last 3 Encounters:  09/29/19 110/68  07/13/19 122/82  05/19/19 136/82   . Pharmacist Clinical Goal(s): o Over the next 90 days, patient will work with PharmD and providers to maintain BP goal <130/80 . Current regimen:  . Amlodipine 2.5 mg qd . Atenolol 50 mg qhs . Losartan 50 mg bid . HCTZ 12.5 mg qd . Eliquis 5 mg bid (last scr 1.38) . Interventions: o Provided diet and exercise counseling. o Reviewed proper technique for checking BP  and encouraged patient to check at home. o Reviewed signs/symptoms of bleeding o Discussed Financial strain of Eliquis co-pay and reviewed PAP approval criteria.  . Patient self care activities - Over the next 90 days, patient will: o Check BP1-2 times weekly, document, and provide at future appointments o Ensure daily salt intake < 2300 mg/day o Avoid NSAIDS o Provide required portion of PAP documentation  Hyperlipidemia Lab Results  Component Value Date/Time   LDLCALC 80 02/02/2019 08:52 AM   . Pharmacist Clinical Goal(s): o Over the next 90 days, patient will work with PharmD and providers to achieve LDL goal < 70 . Current regimen:  o Rosuvastatin 20 mg qd . Interventions: o Provided diet and exercise counseling  o Reviewed HDL/LDL goals . Patient self care activities - Over the next 90 days, patient will: o Strive to exercise 30 min/day 5 days/week  Diabetes Lab Results  Component Value Date/Time   HGBA1C 7.7 (H) 09/29/2019 08:49 AM   HGBA1C 8.3 (H) 05/19/2019 08:26 AM   . Pharmacist Clinical Goal(s): o Over the next 90 days, patient will work with PharmD and providers to achieve A1c goal <7% . Current regimen:  . Metformin XR 1500 mg qhs . Januvia 100 mg qd  . Interventions: o Provided diet and exercise counseling o Reviewed symptoms of hypoglycemia o Discussed financial strain of Januvia co-pay o Discussed benefit side effects of SGLT-2 o Will initiate patient assistance . Patient self care activities - Over the next 90  days, patient will: o Check blood sugar once daily, document, and provide at future appointments o Contact provider with any episodes of hypoglycemia o Provide required portion of PAP  * Medication management . Pharmacist Clinical  Goal(s): o Over the next 90 days, patient will work with PharmD and providers to maintain optimal medication adherence . Current pharmacy: [facility]   . Interventions o Comprehensive medication review  performed. o Continue current medication management strategy . Patient self care activities - Over the next 90 days, patient will: o Focus on medication adherence by fill dates  o Take medications as prescribed o Report any questions or concerns to PharmD and/or provider(s)  Initial goal documentation     . Weight (lb) < 200 lb (90.7 kg)     Wants  To lose some weight after gaining over the summer.       Depression Screen PHQ 2/9 Scores 11/23/2019 07/13/2019 05/19/2019 02/02/2019 12/27/2018 11/22/2018 08/26/2018  PHQ - 2 Score 0 0 0 0 0 0 0  PHQ- 9 Score - 3 0 0 0 - 0    Fall Risk Fall Risk  11/23/2019 07/13/2019 05/19/2019 12/27/2018 11/30/2018  Falls in the past year? 0 0 0 0 0  Number falls in past yr: 0 - 0 - 0  Injury with Fall? 0 - 0 - 0  Risk for fall due to : No Fall Risks No Fall Risks No Fall Risks - -  Risk for fall due to: Comment - - - - -  Follow up Falls prevention discussed Falls evaluation completed Falls evaluation completed Falls evaluation completed -    Any stairs in or around the home? Yes  If so, are there any without handrails? No  Home free of loose throw rugs in walkways, pet beds, electrical cords, etc? Yes  Adequate lighting in your home to reduce risk of falls? Yes   ASSISTIVE DEVICES UTILIZED TO PREVENT FALLS:  Life alert? No  Use of a cane, walker or w/c? No  Grab bars in the bathroom? Yes  Shower chair or bench in shower? Yes  Elevated toilet seat or a handicapped toilet? Yes   TIMED UP AND GO:  Was the test performed? Yes .  Length of time to ambulate 10 feet: 6 sec.   Gait steady and fast without use of assistive device  Cognitive Function: MMSE - Mini Mental State Exam 08/13/2015  Orientation to time 5  Orientation to Place 5  Registration 3  Attention/ Calculation 5  Recall 3  Language- name 2 objects 2     6CIT Screen 11/23/2019 11/22/2018 09/09/2017 08/25/2016  What Year? 0 points 0 points 0 points 0 points  What month? 0 points 0  points 0 points 0 points  What time? 0 points 0 points 0 points 0 points  Count back from 20 0 points 0 points 0 points 0 points  Months in reverse 0 points 0 points 0 points 0 points  Repeat phrase 0 points 0 points 0 points 0 points  Total Score 0 0 0 0    Immunizations Immunization History  Administered Date(s) Administered  . Fluad Quad(high Dose 65+) 09/16/2018, 09/27/2019  . Influenza, High Dose Seasonal PF 10/13/2016, 09/09/2017  . Influenza,inj,Quad PF,6+ Mos 09/26/2014  . Influenza-Unspecified 09/26/2014, 10/13/2016  . PFIZER SARS-COV-2 Vaccination 01/22/2019, 02/13/2019  . Pneumococcal Conjugate-13 10/26/2013  . Pneumococcal Polysaccharide-23 01/31/2011  . Tdap 10/03/2010  . Zoster 04/16/2000  . Zoster Recombinat (Shingrix) 08/12/2017, 10/22/2017    TDAP status: Up to date   Flu Vaccine status: Up to date   Pneumococcal vaccine status: Up to date   Covid-19 vaccine status: Completed vaccines  Qualifies for Shingles Vaccine? Yes   Zostavax completed Yes  Shingrix Completed?: Yes  Screening Tests Health Maintenance  Topic Date Due  . OPHTHALMOLOGY EXAM  09/03/2018  . FOOT EXAM  02/02/2020  . HEMOGLOBIN A1C  03/28/2020  . TETANUS/TDAP  10/02/2020  . INFLUENZA VACCINE  Completed  . COVID-19 Vaccine  Completed  . PNA vac Low Risk Adult  Completed    Health Maintenance  Health Maintenance Due  Topic Date Due  . OPHTHALMOLOGY EXAM  09/03/2018    Colorectal cancer screening: No longer required.   Lung Cancer Screening: (Low Dose CT Chest recommended if Age 28-80 years, 30 pack-year currently smoking OR have quit w/in 15years.) does not qualify.   Additional Screening:  Hepatitis C Screening: does not qualify;  Vision Screening: Recommended annual ophthalmology exams for early detection of glaucoma and other disorders of the eye. Is the patient up to date with their annual eye exam?  No  Who is the provider or what is the name of the office in which  the patient attends annual eye exams? Dr. Mallie Mussel scheduled for 12/10/19  Dental Screening: Recommended annual dental exams for proper oral hygiene  Community Resource Referral / Chronic Care Management: CRR required this visit?  No   CCM required this visit?  No      Plan:     I have personally reviewed and noted the following in the patient's chart:   . Medical and social history . Use of alcohol, tobacco or illicit drugs  . Current medications and supplements . Functional ability and status . Nutritional status . Physical activity . Advanced directives . List of other physicians . Hospitalizations, surgeries, and ER visits in previous 12 months . Vitals . Screenings to include cognitive, depression, and falls . Referrals and appointments  In addition, I have reviewed and discussed with patient certain preventive protocols, quality metrics, and best practice recommendations. A written personalized care plan for preventive services as well as general preventive health recommendations were provided to patient.     Clemetine Marker, LPN   02/16/1733   Nurse Notes: none

## 2019-11-23 NOTE — Patient Instructions (Signed)
Maxwell Stanley , Thank you for taking time to come for your Medicare Wellness Visit. I appreciate your ongoing commitment to your health goals. Please review the following plan we discussed and let me know if I can assist you in the future.   Screening recommendations/referrals: Colonoscopy: no longer required Recommended yearly ophthalmology/optometry visit for glaucoma screening and checkup Recommended yearly dental visit for hygiene and checkup  Vaccinations: Influenza vaccine: done 09/27/19 Pneumococcal vaccine: done 10/26/13 Tdap vaccine: done 10/03/10 Shingles vaccine: done 08/12/17 & 10/22/17   Covid-19: done 01/22/19, 02/13/19 & 10/20/19  Advanced directives: Please bring a copy of your health care power of attorney and living will to the office at your convenience.  Conditions/risks identified: Recommend drinking 6-8 glasses of water per day   Next appointment: Follow up in one year for your annual wellness visit.   Preventive Care 83 Years and Older, Male Preventive care refers to lifestyle choices and visits with your health care provider that can promote health and wellness. What does preventive care include?  A yearly physical exam. This is also called an annual well check.  Dental exams once or twice a year.  Routine eye exams. Ask your health care provider how often you should have your eyes checked.  Personal lifestyle choices, including:  Daily care of your teeth and gums.  Regular physical activity.  Eating a healthy diet.  Avoiding tobacco and drug use.  Limiting alcohol use.  Practicing safe sex.  Taking low doses of aspirin every day.  Taking vitamin and mineral supplements as recommended by your health care provider. What happens during an annual well check? The services and screenings done by your health care provider during your annual well check will depend on your age, overall health, lifestyle risk factors, and family history of disease. Counseling    Your health care provider may ask you questions about your:  Alcohol use.  Tobacco use.  Drug use.  Emotional well-being.  Home and relationship well-being.  Sexual activity.  Eating habits.  History of falls.  Memory and ability to understand (cognition).  Work and work Statistician. Screening  You may have the following tests or measurements:  Height, weight, and BMI.  Blood pressure.  Lipid and cholesterol levels. These may be checked every 5 years, or more frequently if you are over 15 years old.  Skin check.  Lung cancer screening. You may have this screening every year starting at age 83 if you have a 30-pack-year history of smoking and currently smoke or have quit within the past 15 years.  Fecal occult blood test (FOBT) of the stool. You may have this test every year starting at age 83.  Flexible sigmoidoscopy or colonoscopy. You may have a sigmoidoscopy every 5 years or a colonoscopy every 10 years starting at age 83.  Prostate cancer screening. Recommendations will vary depending on your family history and other risks.  Hepatitis C blood test.  Hepatitis B blood test.  Sexually transmitted disease (STD) testing.  Diabetes screening. This is done by checking your blood sugar (glucose) after you have not eaten for a while (fasting). You may have this done every 1-3 years.  Abdominal aortic aneurysm (AAA) screening. You may need this if you are a current or former smoker.  Osteoporosis. You may be screened starting at age 77 if you are at high risk. Talk with your health care provider about your test results, treatment options, and if necessary, the need for more tests. Vaccines  Your health  care provider may recommend certain vaccines, such as:  Influenza vaccine. This is recommended every year.  Tetanus, diphtheria, and acellular pertussis (Tdap, Td) vaccine. You may need a Td booster every 10 years.  Zoster vaccine. You may need this after age  83.  Pneumococcal 13-valent conjugate (PCV13) vaccine. One dose is recommended after age 1.  Pneumococcal polysaccharide (PPSV23) vaccine. One dose is recommended after age 45. Talk to your health care provider about which screenings and vaccines you need and how often you need them. This information is not intended to replace advice given to you by your health care provider. Make sure you discuss any questions you have with your health care provider. Document Released: 01/19/2015 Document Revised: 09/12/2015 Document Reviewed: 10/24/2014 Elsevier Interactive Patient Education  2017 [place] Prevention in the Home Falls can cause injuries. They can happen to people of all ages. There are many things you can do to make your home safe and to help prevent falls. What can I do on the outside of my home?  Regularly fix the edges of walkways and driveways and fix any cracks.  Remove anything that might make you trip as you walk through a door, such as a raised step or threshold.  Trim any bushes or trees on the path to your home.  Use bright outdoor lighting.  Clear any walking paths of anything that might make someone trip, such as rocks or tools.  Regularly check to see if handrails are loose or broken. Make sure that both sides of any steps have handrails.  Any raised decks and porches should have guardrails on the edges.  Have any leaves, snow, or ice cleared regularly.  Use sand or salt on walking paths during winter.  Clean up any spills in your garage right away. This includes oil or grease spills. What can I do in the bathroom?  Use night lights.  Install grab bars by the toilet and in the tub and shower. Do not use towel bars as grab bars.  Use non-skid mats or decals in the tub or shower.  If you need to sit down in the shower, use a plastic, non-slip stool.  Keep the floor dry. Clean up any water that spills on the floor as soon as it happens.  Remove  soap buildup in the tub or shower regularly.  Attach bath mats securely with double-sided non-slip rug tape.  Do not have throw rugs and other things on the floor that can make you trip. What can I do in the bedroom?  Use night lights.  Make sure that you have a light by your bed that is easy to reach.  Do not use any sheets or blankets that are too big for your bed. They should not hang down onto the floor.  Have a firm chair that has side arms. You can use this for support while you get dressed.  Do not have throw rugs and other things on the floor that can make you trip. What can I do in the kitchen?  Clean up any spills right away.  Avoid walking on wet floors.  Keep items that you use a lot in easy-to-reach places.  If you need to reach something above you, use a strong step stool that has a grab bar.  Keep electrical cords out of the way.  Do not use floor polish or wax that makes floors slippery. If you must use wax, use non-skid floor wax.  Do not have  throw rugs and other things on the floor that can make you trip. What can I do with my stairs?  Do not leave any items on the stairs.  Make sure that there are handrails on both sides of the stairs and use them. Fix handrails that are broken or loose. Make sure that handrails are as long as the stairways.  Check any carpeting to make sure that it is firmly attached to the stairs. Fix any carpet that is loose or worn.  Avoid having throw rugs at the top or bottom of the stairs. If you do have throw rugs, attach them to the floor with carpet tape.  Make sure that you have a light switch at the top of the stairs and the bottom of the stairs. If you do not have them, ask someone to add them for you. What else can I do to help prevent falls?  Wear shoes that:  Do not have high heels.  Have rubber bottoms.  Are comfortable and fit you well.  Are closed at the toe. Do not wear sandals.  If you use a  stepladder:  Make sure that it is fully opened. Do not climb a closed stepladder.  Make sure that both sides of the stepladder are locked into place.  Ask someone to hold it for you, if possible.  Clearly mark and make sure that you can see:  Any grab bars or handrails.  First and last steps.  Where the edge of each step is.  Use tools that help you move around (mobility aids) if they are needed. These include:  Canes.  Walkers.  Scooters.  Crutches.  Turn on the lights when you go into a dark area. Replace any light bulbs as soon as they burn out.  Set up your furniture so you have a clear path. Avoid moving your furniture around.  If any of your floors are uneven, fix them.  If there are any pets around you, be aware of where they are.  Review your medicines with your doctor. Some medicines can make you feel dizzy. This can increase your chance of falling. Ask your doctor what other things that you can do to help prevent falls. This information is not intended to replace advice given to you by your health care provider. Make sure you discuss any questions you have with your health care provider. Document Released: 10/19/2008 Document Revised: 05/31/2015 Document Reviewed: 01/27/2014 Elsevier Interactive Patient Education  2017 [employer].

## 2019-11-24 ENCOUNTER — Ambulatory Visit: Payer: Medicare HMO | Admitting: Dermatology

## 2019-11-24 DIAGNOSIS — C44229 Squamous cell carcinoma of skin of left ear and external auricular canal: Secondary | ICD-10-CM | POA: Diagnosis not present

## 2019-11-28 ENCOUNTER — Other Ambulatory Visit: Payer: Self-pay

## 2019-11-28 ENCOUNTER — Ambulatory Visit: Payer: Medicare HMO | Admitting: Dermatology

## 2019-11-28 DIAGNOSIS — C44229 Squamous cell carcinoma of skin of left ear and external auricular canal: Secondary | ICD-10-CM | POA: Diagnosis not present

## 2019-11-28 DIAGNOSIS — D485 Neoplasm of uncertain behavior of skin: Secondary | ICD-10-CM

## 2019-11-28 DIAGNOSIS — C4491 Basal cell carcinoma of skin, unspecified: Secondary | ICD-10-CM

## 2019-11-28 DIAGNOSIS — L570 Actinic keratosis: Secondary | ICD-10-CM | POA: Diagnosis not present

## 2019-11-28 DIAGNOSIS — L578 Other skin changes due to chronic exposure to nonionizing radiation: Secondary | ICD-10-CM | POA: Diagnosis not present

## 2019-11-28 DIAGNOSIS — L820 Inflamed seborrheic keratosis: Secondary | ICD-10-CM

## 2019-11-28 DIAGNOSIS — C4441 Basal cell carcinoma of skin of scalp and neck: Secondary | ICD-10-CM

## 2019-11-28 DIAGNOSIS — C4492 Squamous cell carcinoma of skin, unspecified: Secondary | ICD-10-CM

## 2019-11-28 DIAGNOSIS — L905 Scar conditions and fibrosis of skin: Secondary | ICD-10-CM

## 2019-11-28 HISTORY — DX: Basal cell carcinoma of skin, unspecified: C44.91

## 2019-11-28 HISTORY — DX: Squamous cell carcinoma of skin, unspecified: C44.92

## 2019-11-28 MED ORDER — MUPIROCIN 2 % EX OINT: 1.0000 "application " | TOPICAL_OINTMENT | Freq: Every day | CUTANEOUS | 2 refills | Status: DC

## 2019-11-28 NOTE — Patient Instructions (Addendum)
Cryotherapy Aftercare  . Wash gently with soap and water everyday.   . Apply Vaseline and Band-Aid daily until healed.  Wound Care Instructions  1. Cleanse wound gently with soap and water once a day then pat dry with clean gauze. Apply a thing coat of Petrolatum (petroleum jelly, "Vaseline") over the wound (unless you have an allergy to this). We recommend that you use a new, sterile tube of Vaseline. Do not pick or remove scabs. Do not remove the yellow or white "healing tissue" from the base of the wound.  2. Cover the wound with fresh, clean, nonstick gauze and secure with paper tape. You may use Band-Aids in place of gauze and tape if the would is small enough, but would recommend trimming much of the tape off as there is often too much. Sometimes Band-Aids can irritate the skin.  3. You should call the office for your biopsy report after 1 week if you have not already been contacted.  4. If you experience any problems, such as abnormal amounts of bleeding, swelling, significant bruising, significant pain, or evidence of infection, please call the office immediately.  5. FOR ADULT SURGERY PATIENTS: If you need something for pain relief you may take 1 extra strength Tylenol (acetaminophen) AND 2 Ibuprofen (200mg each) together every 4 hours as needed for pain. (do not take these if you are allergic to them or if you have a reason you should not take them.) Typically, you may only need pain medication for 1 to 3 days.     

## 2019-11-28 NOTE — Progress Notes (Signed)
Follow-Up Visit   Subjective  Maxwell Stanley is a 85 y.o. male who presents for the following: Follow-up (Patient here today for 6 week AK follow up. He does have some sore places around neck. ). Patient also has spots at both ears he would like looked at, the left ear is sore.  Biopsy site showing dermal scar at right chest pectoral seems to be taking a long time to heal per patient. He's still using mupirocin on it and would like a refill if appropriate.   The following portions of the chart were reviewed this encounter and updated as appropriate:  Tobacco  Allergies  Meds  Problems  Med Hx  Surg Hx  Fam Hx     Review of Systems:  No other skin or systemic complaints except as noted in HPI or Assessment and Plan.  Objective  Well appearing patient in no apparent distress; mood and affect are within normal limits.  A focused examination was performed including face, ears, chest. Relevant physical exam findings are noted in the Assessment and Plan.  Objective  Face, ears x 16, scalp x 2 (18): Erythematous thin papules/macules with gritty scale.   Objective  Left ear sup helix: 1.0cm crusted papule BCC vs SCC     Objective  Right neck postauricular: 0.6cm papule R/o BCC     Objective  right chest pectoral: Dyspigmented smooth macule or patch.   Images     Assessment & Plan  AK (actinic keratosis) (18) Face, ears x 16, scalp x 2  Destruction of lesion - Face, ears x 16, scalp x 2 Complexity: simple   Destruction method: cryotherapy   Informed consent: discussed and consent obtained   Timeout:  patient name, date of birth, surgical site, and procedure verified Lesion destroyed using liquid nitrogen: Yes   Region frozen until ice ball extended beyond lesion: Yes   Outcome: patient tolerated procedure well with no complications   Post-procedure details: wound care instructions given    Neoplasm of uncertain behavior of skin (2) Left ear sup  helix  Skin / nail biopsy Type of biopsy: tangential   Informed consent: discussed and consent obtained   Timeout: patient name, date of birth, surgical site, and procedure verified   Procedure prep:  Patient was prepped and draped in usual sterile fashion Prep type:  Isopropyl alcohol Anesthesia: the lesion was anesthetized in a standard fashion   Anesthetic:  1% lidocaine w/ epinephrine 1-100,000 buffered w/ 8.4% NaHCO3 Instrument used: flexible razor blade   Hemostasis achieved with: pressure, aluminum chloride and electrodesiccation   Outcome: patient tolerated procedure well   Post-procedure details: sterile dressing applied and wound care instructions given   Dressing type: petrolatum and bandage    Specimen 1 - Surgical pathology Differential Diagnosis:  Check Margins: No 1.0cm crusted papule BCC vs SCC  Right neck postauricular  Epidermal / dermal shaving  Lesion diameter (cm):  0.6 Informed consent: discussed and consent obtained   Timeout: patient name, date of birth, surgical site, and procedure verified   Procedure prep:  Patient was prepped and draped in usual sterile fashion Prep type:  Isopropyl alcohol Anesthesia: the lesion was anesthetized in a standard fashion   Anesthetic:  1% lidocaine w/ epinephrine 1-100,000 buffered w/ 8.4% NaHCO3 Instrument used: flexible razor blade   Hemostasis achieved with: pressure, aluminum chloride and electrodesiccation   Outcome: patient tolerated procedure well   Post-procedure details: sterile dressing applied and wound care instructions given   Dressing type:  bandage and petrolatum    Destruction of lesion Complexity: extensive   Destruction method: electrodesiccation and curettage   Informed consent: discussed and consent obtained   Timeout:  patient name, date of birth, surgical site, and procedure verified Procedure prep:  Patient was prepped and draped in usual sterile fashion Prep type:  Isopropyl  alcohol Anesthesia: the lesion was anesthetized in a standard fashion   Anesthetic:  1% lidocaine w/ epinephrine 1-100,000 buffered w/ 8.4% NaHCO3 Curettage performed in three different directions: Yes   Electrodesiccation performed over the curetted area: Yes   Lesion length (cm):  0.6 Lesion width (cm):  0.6 Margin per side (cm):  0.2 Final wound size (cm):  1 Hemostasis achieved with:  pressure, aluminum chloride and electrodesiccation Outcome: patient tolerated procedure well with no complications   Post-procedure details: sterile dressing applied and wound care instructions given   Dressing type: bandage and petrolatum    Specimen 2 - Surgical pathology Differential Diagnosis:  Check Margins: No 0.6cm papule R/o BCC  Other Related Medications mupirocin ointment (BACTROBAN) 2 %  Scar right chest pectoral Biopsy proven Benign-appearing.  Observation.  Call clinic for new or changing lesions.  Recommend daily use of broad spectrum spf 30+ sunscreen to sun-exposed areas.  See photo  Inflamed seborrheic keratosis (2) Scalp x 1, left mastoid x 1  Destruction of lesion - Scalp x 1, left mastoid x 1 Complexity: simple   Destruction method: cryotherapy   Informed consent: discussed and consent obtained   Timeout:  patient name, date of birth, surgical site, and procedure verified Lesion destroyed using liquid nitrogen: Yes   Region frozen until ice ball extended beyond lesion: Yes   Outcome: patient tolerated procedure well with no complications   Post-procedure details: wound care instructions given    Actinic Damage - chronic, secondary to cumulative UV radiation exposure/sun exposure over time - diffuse scaly erythematous macules with underlying dyspigmentation - Recommend daily broad spectrum sunscreen SPF 30+ to sun-exposed areas, reapply every 2 hours as needed.  - Call for new or changing lesions.  Return in about 3 months (around 02/28/2020) for AK follow  up.  Graciella Belton, RMA, am acting as scribe for Sarina Ser, MD . Documentation: I have reviewed the above documentation for accuracy and completeness, and I agree with the above.  Sarina Ser, MD

## 2019-12-06 ENCOUNTER — Encounter: Payer: Self-pay | Admitting: Dermatology

## 2019-12-07 ENCOUNTER — Telehealth: Payer: Self-pay

## 2019-12-07 NOTE — Telephone Encounter (Signed)
LM on VM to return my call. 

## 2019-12-07 NOTE — Telephone Encounter (Signed)
-----   Message from Ralene Bathe, MD sent at 12/06/2019  7:48 AM EST ----- Diagnosis 1. 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 Skin , right neck postauricular BASAL CELL CARCINOMA, NODULAR PATTERN  1- Cancer - SCC Schedule for surgery 2- Cancer - BCC Already treated Recheck next visit

## 2019-12-12 NOTE — Telephone Encounter (Signed)
Informed pt of results and scheduled surgery.

## 2019-12-13 ENCOUNTER — Encounter: Payer: Self-pay | Admitting: Internal Medicine

## 2019-12-13 DIAGNOSIS — H25013 Cortical age-related cataract, bilateral: Secondary | ICD-10-CM | POA: Diagnosis not present

## 2019-12-13 LAB — HM DIABETES EYE EXAM

## 2019-12-15 DIAGNOSIS — Z0100 Encounter for examination of eyes and vision without abnormal findings: Secondary | ICD-10-CM | POA: Diagnosis not present

## 2019-12-19 ENCOUNTER — Other Ambulatory Visit: Payer: Self-pay | Admitting: Internal Medicine

## 2019-12-19 DIAGNOSIS — N401 Enlarged prostate with lower urinary tract symptoms: Secondary | ICD-10-CM

## 2019-12-19 DIAGNOSIS — I10 Essential (primary) hypertension: Secondary | ICD-10-CM

## 2019-12-19 NOTE — Telephone Encounter (Signed)
Requested Prescriptions  Pending Prescriptions Disp Refills  . tamsulosin (FLOMAX) 0.4 MG CAPS capsule [Pharmacy Med Name: TAMSULOSIN CAP 0.4MG ] 90 capsule 0    Sig: TAKE 1 CAPSULE DAILY AFTER BREAKFAST     Urology: Alpha-Adrenergic Blocker Passed - 12/19/2019 10:31 AM      Passed - Last BP in normal range    BP Readings from Last 1 Encounters:  11/23/19 108/62         Passed - Valid encounter within last 12 months    Recent Outpatient Visits          2 months ago Type II diabetes mellitus with complication [facility])   [facility] Glean Hess, MD   5 months ago Type II diabetes mellitus with complication [facility])   [facility] Glean Hess, MD   7 months ago Type II diabetes mellitus with complication [facility])   [facility] Glean Hess, MD   10 months ago Annual physical exam   [facility] Glean Hess, MD   11 months ago Chronic maxillary sinusitis   [facility] Juline Patch, MD      Future Appointments            In 1 month Ralene Bathe, MD [place]   In 1 month [name], Jesse Sans, MD [facility]           . hydrochlorothiazide (MICROZIDE) 12.5 MG capsule [Pharmacy Med Name: HYDROCHLOROT CAP 12.5MG ] 90 capsule 0    Sig: TAKE 1 CAPSULE DAILY (IN   PLACE OF COMBINATION       TABLET)     Cardiovascular: Diuretics - Thiazide Failed - 12/19/2019 10:31 AM      Failed - Cr in normal range and within 360 days    Creatinine, Ser  Date Value Ref Range Status  05/19/2019 1.38 (H) 0.76 - 1.27 mg/dL Final         Passed - Ca in normal range and within 360 days    Calcium  Date Value Ref Range Status  05/19/2019 9.9 8.6 - 10.2 mg/dL Final         Passed - K in normal range and within 360 days    Potassium  Date Value Ref Range Status  05/19/2019 4.7 3.5 - 5.2 mmol/L Final  01/28/2011 4.2 3.5 - 5.1 mmol/L Final    Comment:    POTASSIUM - Slight hemolysis, interpret results  with  - caution.          Passed - Na in normal range and within 360 days    Sodium  Date Value Ref Range Status  05/19/2019 141 134 - 144 mmol/L Final         Passed - Last BP in normal range    BP Readings from Last 1 Encounters:  11/23/19 108/62         Passed - Valid encounter within last 6 months    Recent Outpatient Visits          2 months ago Type II diabetes mellitus with complication [facility])   [facility] Glean Hess, MD   5 months ago Type II diabetes mellitus with complication [facility])   [facility], [facility]   7 months ago Type II diabetes mellitus with complication [facility])   [facility] Glean Hess, MD   10 months ago Annual physical exam   [facility] Halina Maidens  H, MD   11 months ago Chronic maxillary sinusitis   [facility] Juline Patch, MD      Future Appointments            In 1 month Ralene Bathe, MD [place]   In 1 month [name] Jesse Sans, MD [facility]           . atenolol (TENORMIN) 50 MG tablet [Pharmacy Med Name: ATENOLOL TAB 50MG ] 90 tablet 0    Sig: TAKE 1 TABLET AT BEDTIME     Cardiovascular:  Beta Blockers Passed - 12/19/2019 10:31 AM      Passed - Last BP in normal range    BP Readings from Last 1 Encounters:  11/23/19 108/62         Passed - Last Heart Rate in normal range    Pulse Readings from Last 1 Encounters:  11/23/19 76         Passed - Valid encounter within last 6 months    Recent Outpatient Visits          2 months ago Type II diabetes mellitus with complication [facility])   [facility] Glean Hess, MD   5 months ago Type II diabetes mellitus with complication [facility])   [facility] Glean Hess, MD   7 months ago Type II diabetes mellitus with complication [facility])   [facility] Glean Hess, MD   10 months ago Annual physical exam   [facility] Glean Hess, MD   11 months ago Chronic maxillary sinusitis   [facility] Juline Patch, MD      Future Appointments            In 1 month Ralene Bathe, MD [place]   In 1 month [name], Jesse Sans, MD [facility] [place]           . finasteride (PROSCAR) 5 MG tablet [Pharmacy Med Name: FINASTERIDE  TAB 5MG ] 90 tablet 0    Sig: TAKE 1 TABLET DAILY     Urology: 5-alpha Reductase Inhibitors Passed - 12/19/2019 10:31 AM      Passed - Valid encounter within last 12 months    Recent Outpatient Visits          2 months ago Type II diabetes mellitus with complication [facility])   [facility] Glean Hess, MD   5 months ago Type II diabetes mellitus with complication [facility])   [facility] Glean Hess, MD   7 months ago Type II diabetes mellitus with complication [facility])   [facility] Glean Hess, MD   10 months ago Annual physical exam   [facility] Glean Hess, MD   11 months ago Chronic maxillary sinusitis   [facility] Juline Patch, MD      Future Appointments            In 1 month Ralene Bathe, MD [place]   In 1 month [name] Jesse Sans, MD [facility] [place]           . fluticasone (FLONASE) 50 MCG/ACT nasal spray [Pharmacy Med Name: FLUTICASONE  SPR 50MCG RX] 48 g 0    Sig: USE 2 SPRAYS IN EACH       NOSTRIL DAILY     Ear, Nose, and Throat: Nasal Preparations - Corticosteroids Passed - 12/19/2019 10:31 AM  Passed - Valid encounter within last 12 months    Recent Outpatient Visits          2 months ago Type II diabetes mellitus with complication [facility])   [facility] Glean Hess, MD   5 months ago Type II diabetes mellitus with complication [facility])   [facility] Glean Hess, MD   7 months ago Type II diabetes mellitus with complication [facility])   [facility] Glean Hess, MD   10 months ago Annual physical  exam   [facility] Glean Hess, MD   11 months ago Chronic maxillary sinusitis   [facility] Juline Patch, MD      Future Appointments            In 1 month Ralene Bathe, MD [place]   In 1 month [name], Jesse Sans, MD [facility]

## 2019-12-21 ENCOUNTER — Other Ambulatory Visit: Payer: Self-pay

## 2019-12-21 ENCOUNTER — Ambulatory Visit: Payer: Medicare HMO | Admitting: Dermatology

## 2019-12-21 ENCOUNTER — Encounter: Payer: Self-pay | Admitting: Dermatology

## 2019-12-21 DIAGNOSIS — Z85828 Personal history of other malignant neoplasm of skin: Secondary | ICD-10-CM

## 2019-12-21 DIAGNOSIS — L578 Other skin changes due to chronic exposure to nonionizing radiation: Secondary | ICD-10-CM

## 2019-12-21 DIAGNOSIS — C4492 Squamous cell carcinoma of skin, unspecified: Secondary | ICD-10-CM

## 2019-12-21 DIAGNOSIS — C44712 Basal cell carcinoma of skin of right lower limb, including hip: Secondary | ICD-10-CM

## 2019-12-21 DIAGNOSIS — C44229 Squamous cell carcinoma of skin of left ear and external auricular canal: Secondary | ICD-10-CM

## 2019-12-21 DIAGNOSIS — D492 Neoplasm of unspecified behavior of bone, soft tissue, and skin: Secondary | ICD-10-CM

## 2019-12-21 NOTE — Patient Instructions (Signed)

## 2019-12-21 NOTE — Progress Notes (Signed)
   Follow-Up Visit   Subjective  Maxwell Stanley is a 85 y.o. male who presents for the following: non healing sore (R lat pretibia, ~3wks, bleeds).  The following portions of the chart were reviewed this encounter and updated as appropriate:   Tobacco  Allergies  Meds  Problems  Med Hx  Surg Hx  Fam Hx     Review of Systems:  No other skin or systemic complaints except as noted in HPI or Assessment and Plan.  Objective  Well appearing patient in no apparent distress; mood and affect are within normal limits.  A focused examination was performed including R lower leg, R neck/postauricular, L ear. Relevant physical exam findings are noted in the Assessment and Plan.  Objective  R mid lat calf: 1.2cm crusted ulcer     Objective  Left Ear sup helix: Pink bx site  Objective  R neck postauricular: Well healed scar with no evidence of recurrence.    Assessment & Plan  Neoplasm of skin R mid lat calf  Skin / nail biopsy Type of biopsy: tangential   Informed consent: discussed and consent obtained   Timeout: patient name, date of birth, surgical site, and procedure verified   Procedure prep:  Patient was prepped and draped in usual sterile fashion Prep type:  Isopropyl alcohol Anesthesia: the lesion was anesthetized in a standard fashion   Anesthetic:  1% lidocaine w/ epinephrine 1-100,000 buffered w/ 8.4% NaHCO3 Instrument used: flexible razor blade   Outcome: patient tolerated procedure well   Post-procedure details: sterile dressing applied and wound care instructions given   Dressing type: bandage and bacitracin    Specimen 1 - Surgical pathology Differential Diagnosis: D48.5 Traumatic ulcer vs BCC  Check Margins: No 1.2cm crusted ulcer  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 Observe for recurrence. Call clinic for  new or changing lesions.  Recommend regular skin exams, daily broad-spectrum spf 30+ sunscreen use, and photoprotection.     Actinic Damage - chronic, secondary to cumulative UV radiation exposure/sun exposure over time - diffuse scaly erythematous macules with underlying dyspigmentation - Recommend daily broad spectrum sunscreen SPF 30+ to sun-exposed areas, reapply every 2 hours as needed.  - Call for new or changing lesions.  Return for pending bx or as scheduled.   I, Othelia Pulling, RMA, am acting as scribe for Sarina Ser, MD .  Documentation: I have reviewed the above documentation for accuracy and completeness, and I agree with the above.  Sarina Ser, MD

## 2019-12-26 ENCOUNTER — Encounter: Payer: Self-pay | Admitting: Dermatology

## 2020-01-03 ENCOUNTER — Other Ambulatory Visit: Payer: Self-pay | Admitting: Internal Medicine

## 2020-01-03 DIAGNOSIS — K2100 Gastro-esophageal reflux disease with esophagitis, without bleeding: Secondary | ICD-10-CM

## 2020-01-03 NOTE — Telephone Encounter (Signed)
Requested Prescriptions  Pending Prescriptions Disp Refills  . pantoprazole (PROTONIX) 40 MG tablet [Pharmacy Med Name: PANTOPRAZOLE TAB 40MG  DR] 90 tablet 0    Sig: TAKE 1 TABLET DAILY     Gastroenterology: Proton Pump Inhibitors Passed - 01/03/2020  9:55 AM      Passed - Valid encounter within last 12 months    Recent Outpatient Visits          3 months ago Type II diabetes mellitus with complication [facility])   [facility], MD   5 months ago Type II diabetes mellitus with complication [facility])   [facility], MD   7 months ago Type II diabetes mellitus with complication [facility])   [facility], MD   11 months ago Annual physical exam   [facility], MD   1 year ago Chronic maxillary sinusitis   [facility] Reubin Milan, MD      Future Appointments            In 1 month Duanne Limerick Judithann Graves, MD [facility]   In 2 months [facility], MD [facility]

## 2020-01-08 NOTE — Discharge Instructions (Signed)
Instructions after Total Knee Replacement   Maxwell Stanley, Jr., Maxwell Stanley.     Dept. of Orthopaedics & Sports Medicine  [facility]  [address]  [place], [place]  [address]  Phone: [phone]   Fax: [phone]    DIET: Drink plenty of non-alcoholic fluids. Resume your normal diet. Include foods high in fiber.  ACTIVITY:  You may use crutches or a walker with weight-bearing as tolerated, unless instructed otherwise. You may be weaned off of the walker or crutches by your Physical Therapist.  Do NOT place pillows under the knee. Anything placed under the knee could limit your ability to straighten the knee.   Continue doing gentle exercises. Exercising will reduce the pain and swelling, increase motion, and prevent muscle weakness.   Please continue to use the TED compression stockings for 6 weeks. You may remove the stockings at night, but should reapply them in the morning. Do not drive or operate any equipment until instructed.  WOUND CARE:  Continue to use the PolarCare or ice packs periodically to reduce pain and swelling. You may bathe or shower after the staples are removed at the first office visit following surgery.  MEDICATIONS: You may resume your regular medications. Please take the pain medication as prescribed on the medication. Do not take pain medication on an empty stomach. You have been given a prescription for a blood thinner (Lovenox or Coumadin). Please take the medication as instructed. (NOTE: After completing a 2 week course of Lovenox, take one Enteric-coated aspirin once a day. This along with elevation will help reduce the possibility of phlebitis in your operated leg.) Do not drive or drink alcoholic beverages when taking pain medications.  CALL THE OFFICE FOR: Temperature above 101 degrees Excessive bleeding or drainage on the dressing. Excessive swelling, coldness, or paleness of the toes. Persistent nausea and vomiting.  FOLLOW-UP:  You  should have an appointment to return to the office in 10-14 days after surgery. Arrangements have been made for continuation of Physical Therapy (either home therapy or outpatient therapy).   [facility] Department Directory         [email or web address]       [email or web address]          Cardiology  Appointments: [place] - [phone] [place] - [phone]  Endocrinology  Appointments: [place] - [phone] [place] - [phone]  Gastroenterology  Appointments: [place] - [phone] [place] - [phone]        General Surgery   Appointments: [place] - [phone]  Internal Medicine/Family Medicine  Appointments: [place] - [phone] [place] - [phone] [place] - [phone]  Metabolic and Weigh Loss Surgery  Appointments: [place] - [phone]        Neurology  Appointments: [place] - [phone] [place] - [phone]  Neurosurgery  Appointments: [place] - [phone]  Obstetrics & Gynecology  Appointments: [place] - [phone] [place] - [phone]        Pediatrics  Appointments: [place] - [phone] [place] - [phone]  Physiatry  Appointments: [place] -[phone]  Physical Therapy  Appointments: [place] - [phone] [place] - [phone]        Podiatry  Appointments: [place] - [phone] [place] - [phone]  Pulmonology  Appointments: [place] - [phone]  Rheumatology  Appointments: [place] - [phone]        [place] Location: [facility]  [address] , [place]  [address]  [place] Location: [facility] [address] [place], [place]  [address]  [place] Location: [facility] [address] [place], [place]  [address]    

## 2020-01-11 ENCOUNTER — Ambulatory Visit: Payer: Medicare HMO | Admitting: Dermatology

## 2020-01-18 ENCOUNTER — Encounter
Admission: RE | Admit: 2020-01-18 | Discharge: 2020-01-18 | Disposition: A | Discharge status: Home / Self Care | Payer: Medicare HMO | Source: Ambulatory Visit | Attending: Orthopedic Surgery | Admitting: Orthopedic Surgery

## 2020-01-18 ENCOUNTER — Encounter: Payer: Self-pay | Admitting: Urgent Care

## 2020-01-18 ENCOUNTER — Other Ambulatory Visit: Payer: Medicare HMO

## 2020-01-18 ENCOUNTER — Other Ambulatory Visit: Payer: Self-pay

## 2020-01-18 DIAGNOSIS — Z01818 Encounter for other preprocedural examination: Secondary | ICD-10-CM | POA: Insufficient documentation

## 2020-01-18 DIAGNOSIS — J019 Acute sinusitis, unspecified: Secondary | ICD-10-CM | POA: Diagnosis not present

## 2020-01-18 DIAGNOSIS — J329 Chronic sinusitis, unspecified: Secondary | ICD-10-CM | POA: Diagnosis not present

## 2020-01-18 DIAGNOSIS — J320 Chronic maxillary sinusitis: Secondary | ICD-10-CM | POA: Diagnosis not present

## 2020-01-18 HISTORY — DX: Hyperlipidemia, unspecified: E78.5

## 2020-01-18 LAB — CBC
HCT: 34.3 % — ABNORMAL LOW (ref 39.0–52.0)
Hemoglobin: 11.9 g/dL — ABNORMAL LOW (ref 13.0–17.0)
MCH: 33.6 pg (ref 26.0–34.0)
MCHC: 34.7 g/dL (ref 30.0–36.0)
MCV: 96.9 fL (ref 80.0–100.0)
Platelets: 199 10*3/uL (ref 150–400)
RBC: 3.54 MIL/uL — ABNORMAL LOW (ref 4.22–5.81)
RDW: 12.6 % (ref 11.5–15.5)
WBC: 7.5 10*3/uL (ref 4.0–10.5)
nRBC: 0 % (ref 0.0–0.2)

## 2020-01-18 LAB — COMPREHENSIVE METABOLIC PANEL
ALT: 18 U/L (ref 0–44)
AST: 23 U/L (ref 15–41)
Albumin: 4.1 g/dL (ref 3.5–5.0)
Alkaline Phosphatase: 69 U/L (ref 38–126)
Anion gap: 12 (ref 5–15)
BUN: 23 mg/dL (ref 8–23)
CO2: 22 mmol/L (ref 22–32)
Calcium: 9.4 mg/dL (ref 8.9–10.3)
Chloride: 103 mmol/L (ref 98–111)
Creatinine, Ser: 1.37 mg/dL — ABNORMAL HIGH (ref 0.61–1.24)
GFR, Estimated: 51 mL/min — ABNORMAL LOW (ref >60)
Glucose, Bld: 212 mg/dL — ABNORMAL HIGH (ref 70–99)
Potassium: 4.1 mmol/L (ref 3.5–5.1)
Sodium: 137 mmol/L (ref 135–145)
Total Bilirubin: 0.9 mg/dL (ref 0.3–1.2)
Total Protein: 7.2 g/dL (ref 6.5–8.1)

## 2020-01-18 LAB — URINALYSIS, ROUTINE W REFLEX MICROSCOPIC
Glucose, UA: NEGATIVE mg/dL
Hgb urine dipstick: NEGATIVE
Ketones, ur: 15 mg/dL — AB
Leukocytes,Ua: NEGATIVE
Nitrite: NEGATIVE
Protein, ur: 30 mg/dL — AB
Specific Gravity, Urine: >1.03 — ABNORMAL HIGH (ref 1.005–1.030)
pH: 5 (ref 5.0–8.0)

## 2020-01-18 LAB — TYPE AND SCREEN
ABO/RH(D): O POS
Antibody Screen: NEGATIVE

## 2020-01-18 LAB — APTT: aPTT: 36 seconds (ref 24–36)

## 2020-01-18 LAB — C-REACTIVE PROTEIN: CRP: 2.4 mg/dL — ABNORMAL HIGH (ref <1.0)

## 2020-01-18 LAB — SEDIMENTATION RATE: Sed Rate: 31 mm/hr — ABNORMAL HIGH (ref 0–16)

## 2020-01-18 LAB — URINALYSIS, MICROSCOPIC (REFLEX)
Bacteria, UA: NONE SEEN
RBC / HPF: NONE SEEN RBC/hpf (ref 0–5)
Squamous Epithelial / HPF: NONE SEEN (ref 0–5)
WBC, UA: NONE SEEN WBC/hpf (ref 0–5)

## 2020-01-18 LAB — HEMOGLOBIN A1C
Hgb A1c MFr Bld: 6.1 % — ABNORMAL HIGH (ref 4.8–5.6)
Mean Plasma Glucose: 128.37 mg/dL

## 2020-01-18 LAB — SURGICAL PCR SCREEN
MRSA, PCR: NEGATIVE
Staphylococcus aureus: NEGATIVE

## 2020-01-18 LAB — PROTIME-INR
INR: 1.3 — ABNORMAL HIGH (ref 0.8–1.2)
Prothrombin Time: 15.6 seconds — ABNORMAL HIGH (ref 11.4–15.2)

## 2020-01-18 NOTE — Patient Instructions (Addendum)
Your procedure is scheduled on:  Friday, January 21 Report to the Registration Desk on the 1st floor of the [facility]. To find out your arrival time, please call [phone] between 1PM - 3PM on: Thursday, January 20  REMEMBER: Instructions that are not followed completely may result in serious medical risk, up to and including death; or upon the discretion of your surgeon and anesthesiologist your surgery may need to be rescheduled.  Do not eat food after midnight the night before surgery.  No gum chewing, lozengers or hard candies.  You may however, drink water up to 2 hours before you are scheduled to arrive for your surgery. Do not drink anything within 2 hours of your scheduled arrival time.  TAKE THESE MEDICATIONS THE MORNING OF SURGERY WITH A SIP OF WATER:  1.  Amlodipine 2.  Finasteride (Proscar) 3.  Pantoprazole (Protonox) - (take one the night before and one on the morning of surgery - helps to prevent nausea after surgery.) 4.  tamsulosin (Flomax)  Stop Metformin 2 days prior to surgery. Last day to take is Tuesday, January 18; resume after surgery.  Follow recommendations from Cardiologist regarding stopping Eliquis. Stop 3 days prior to surgery (last day to take is Monday, January 17).  One week prior to surgery: starting Friday, January 14 Stop Anti-inflammatories (NSAIDS) such as Advil, Aleve, Ibuprofen, Motrin, Naproxen, Naprosyn and Aspirin based products such as Excedrin, Goodys Powder, BC Powder. Stop ANY OVER THE COUNTER supplements until after surgery. Stop osteobi-flex, probiotic) (However, you may continue taking Vitamin D and multivitamin up until the day before surgery.)  No Alcohol for 24 hours before or after surgery.  No Smoking including e-cigarettes for 24 hours prior to surgery.  No chewable tobacco products for at least 6 hours prior to surgery.  No nicotine patches on the day of surgery.  On the morning of surgery brush your teeth with  toothpaste and water, you may rinse your mouth with mouthwash if you wish. Do not swallow any toothpaste or mouthwash.  Do not wear jewelry, make-up, hairpins, clips or nail polish.  Do not wear lotions, powders, or perfumes.   Do not shave body from the neck down 48 hours prior to surgery just in case you cut yourself which could leave a site for infection.  Also, freshly shaved skin may become irritated if using the CHG soap.  Do not bring valuables to the hospital. [facility] is not responsible for any missing/lost belongings or valuables.   Use CHG Soap as directed on instruction sheet.  Notify your doctor if there is any change in your medical condition (cold, fever, infection).  Wear comfortable clothing (specific to your surgery type) to the hospital.  Plan for stool softeners for home use; pain medications have a tendency to cause constipation. You can also help prevent constipation by eating foods high in fiber such as fruits and vegetables and drinking plenty of fluids as your diet allows.  After surgery, you can help prevent lung complications by doing breathing exercises.  Take deep breaths and cough every 1-2 hours. Your doctor may order a device called an Incentive Spirometer to help you take deep breaths. When coughing or sneezing, hold a pillow firmly against your incision with both hands. This is called "splinting." Doing this helps protect your incision. It also decreases belly discomfort.  If you are being admitted to the hospital overnight, leave your suitcase in the car. After surgery it may be brought to your room.  If you are being discharged the day of surgery, you will not be allowed to drive home. You will need a responsible adult (18 years or older) to drive you home and stay with you that night.   If you are taking public transportation, you will need to have a responsible adult (18 years or older) with you. Please confirm with your physician that it is  acceptable to use public transportation.   Please call the [place] Dept. at [phone] if you have any questions about these instructions.  Visitation Policy:  Patients undergoing a surgery or procedure may have one family member or support person with them as long as that person is not COVID-19 positive or experiencing its symptoms.  That person may remain in the waiting area during the procedure.  Inpatient Visitation:    Visiting hours are 7 a.m. to 8 p.m. Patients will be allowed one visitor. The visitor may change daily. The visitor must pass COVID-19 screenings, use hand sanitizer when entering and exiting the patient's room and wear a mask at all times, including in the patient's room. Patients must also wear a mask when staff or their visitor are in the room. Masking is required regardless of vaccination status. Systemwide, no visitors 17 or younger.

## 2020-01-18 NOTE — Pre-Procedure Instructions (Signed)
Has an open lesion below right knee that was biopsied by Dr. Sarina Ser in December. Patient was instructed to call Dr. Clydell Hakim office to inform them of the open wound as it could delay surgery. Patient acknowledged understanding and will contact Dr. Marry Guan today.

## 2020-01-20 LAB — URINE CULTURE
Culture: 10000 — AB
Special Requests: NORMAL

## 2020-01-21 LAB — IGE: IgE (Immunoglobulin E), Serum: 35 IU/mL (ref 6–495)

## 2020-01-22 ENCOUNTER — Encounter: Payer: Self-pay | Admitting: Orthopedic Surgery

## 2020-01-25 ENCOUNTER — Other Ambulatory Visit: Admission: RE | Admit: 2020-01-25 | Payer: Medicare HMO | Source: Ambulatory Visit

## 2020-01-25 ENCOUNTER — Telehealth: Payer: Self-pay

## 2020-01-25 NOTE — Telephone Encounter (Signed)
Spoke with Margaretha Sheffield at Dr. Clydell Hakim office and everyone agrees to getting patient scheduled before knee replacement. Patient has been scheduled tomorrow at 12:00 to come in and treat the calf.

## 2020-01-25 NOTE — Telephone Encounter (Signed)
His orthopedic procedure will not interfere with what we need to do. When is his orthopedic procedure scheduled for? We could try to get him in to do skin cancer treatment before that, but it will still take 4 weeks to heal, so whatever the pt and Dr Marry Guan want to do is fine with [place]. Please advise them.

## 2020-01-25 NOTE — Telephone Encounter (Signed)
Maxwell Stanley from Dr. Clydell Hakim office called regarding patients previous biopsy of the right mid lat calf. This was a BCC and he needs a shave removal and EDC. Dr. Marry Guan is going to be doing a total knee replacement for patient and just want to make sure this did not interfere with patients diagnosis with our office. Patient is scheduled to come in 03/06/20.

## 2020-01-26 ENCOUNTER — Other Ambulatory Visit: Payer: Self-pay

## 2020-01-26 ENCOUNTER — Other Ambulatory Visit: Payer: Self-pay | Admitting: Internal Medicine

## 2020-01-26 ENCOUNTER — Ambulatory Visit: Payer: Medicare HMO | Admitting: Dermatology

## 2020-01-26 ENCOUNTER — Encounter: Payer: Self-pay | Admitting: Dermatology

## 2020-01-26 DIAGNOSIS — C44712 Basal cell carcinoma of skin of right lower limb, including hip: Secondary | ICD-10-CM

## 2020-01-26 DIAGNOSIS — L578 Other skin changes due to chronic exposure to nonionizing radiation: Secondary | ICD-10-CM

## 2020-01-26 DIAGNOSIS — E1121 Type 2 diabetes mellitus with diabetic nephropathy: Secondary | ICD-10-CM

## 2020-01-26 DIAGNOSIS — E785 Hyperlipidemia, unspecified: Secondary | ICD-10-CM

## 2020-01-26 DIAGNOSIS — E1169 Type 2 diabetes mellitus with other specified complication: Secondary | ICD-10-CM

## 2020-01-26 MED ORDER — SITAGLIPTIN PHOSPHATE 100 MG PO TABS: 100.0000 mg | ORAL_TABLET | Freq: Every day | ORAL | 1 refills | Status: DC

## 2020-01-26 MED ORDER — ROSUVASTATIN CALCIUM 20 MG PO TABS: 20.0000 mg | ORAL_TABLET | Freq: Every day | ORAL | 0 refills | Status: DC

## 2020-01-26 NOTE — Progress Notes (Signed)
   Follow-Up Visit   Subjective  Maxwell Stanley is a 86 y.o. male who presents for the following: Skin Cancer (Biopsy proven BCC at R mid lateral calf, pt here for treatment today ).   The following portions of the chart were reviewed this encounter and updated as appropriate:   Tobacco  Allergies  Meds  Problems  Med Hx  Surg Hx  Fam Hx     Review of Systems:  No other skin or systemic complaints except as noted in HPI or Assessment and Plan.  Objective  Well appearing patient in no apparent distress; mood and affect are within normal limits.  A focused examination was performed including right leg. Relevant physical exam findings are noted in the Assessment and Plan.  Objective  Right mid lateral calf: Pink pearly papule or plaque with arborizing vessels.    Assessment & Plan  Basal cell carcinoma (BCC) of skin of right lower extremity including hip Right mid lateral calf  Destruction of lesion Complexity: extensive   Destruction method: electrodesiccation and curettage   Informed consent: discussed and consent obtained   Timeout:  patient name, date of birth, surgical site, and procedure verified Procedure prep:  Patient was prepped and draped in usual sterile fashion Prep type:  Isopropyl alcohol Anesthesia: the lesion was anesthetized in a standard fashion   Anesthetic:  1% lidocaine w/ epinephrine 1-100,000 buffered w/ 8.4% NaHCO3 Curettage performed in three different directions: Yes   Electrodesiccation performed over the curetted area: Yes   Final wound size (cm):  2.2 Hemostasis achieved with:  pressure, aluminum chloride and electrodesiccation Outcome: patient tolerated procedure well with no complications   Post-procedure details: sterile dressing applied and wound care instructions given   Dressing type: bandage and petrolatum    Actinic Damage - chronic, secondary to cumulative UV radiation exposure/sun exposure over time - diffuse scaly  erythematous macules with underlying dyspigmentation - Recommend daily broad spectrum sunscreen SPF 30+ to sun-exposed areas, reapply every 2 hours as needed.  - Call for new or changing lesions.  Return for as scheduled March 2022.  IMarye Round, CMA, am acting as scribe for Sarina Ser, MD .  Documentation: I have reviewed the above documentation for accuracy and completeness, and I agree with the above.  Sarina Ser, MD

## 2020-01-26 NOTE — Telephone Encounter (Signed)
Copied from [place] [id number]. Topic: Quick Communication - Rx Refill/Question >> Jan 26, 2020  9:42 AM Leward Quan A wrote: Medication: rosuvastatin (CRESTOR) 20 MG tablet, ELIQUIS 5 MG TABS tablet, sitaGLIPtin (JANUVIA) 100 MG tablet  Has the patient contacted their pharmacy? Yes.   (Agent: If no, request that the patient contact the pharmacy for the refill.) (Agent: If yes, when and what did the pharmacy advise?)  Preferred Pharmacy (with phone number or street name): [facility] [place], [place] to [facility] [place] [facility]  Phone:  [phone] Fax:  [phone]     Agent: Please be advised that RX refills may take up to 3 business days. We ask that you follow-up with your pharmacy.

## 2020-01-26 NOTE — Patient Instructions (Signed)

## 2020-01-26 NOTE — Telephone Encounter (Signed)
Requested medication (s) are due for refill today: Yes  Requested medication (s) are on the active medication list: Yes  Last refill:  11/2018  Future visit scheduled: Yes  Notes to clinic:  Unable to refill per protocol, expired Rx     Requested Prescriptions  Pending Prescriptions Disp Refills   ELIQUIS 5 MG TABS tablet 60 tablet     Sig: Take 1 tablet (5 mg total) by mouth 2 (two) times daily.      Hematology:  Anticoagulants Failed - 01/26/2020 11:18 AM      Failed - HGB in normal range and within 360 days    Hemoglobin  Date Value Ref Range Status  01/18/2020 11.9 (L) 13.0 - 17.0 g/dL Final  02/02/2019 15.4 13.0 - 17.7 g/dL Final          Failed - HCT in normal range and within 360 days    HCT  Date Value Ref Range Status  01/18/2020 34.3 (L) 39.0 - 52.0 % Final   Hematocrit  Date Value Ref Range Status  02/02/2019 44.5 37.5 - 51.0 % Final          Failed - Cr in normal range and within 360 days    Creatinine, Ser  Date Value Ref Range Status  01/18/2020 1.37 (H) 0.61 - 1.24 mg/dL Final          Passed - PLT in normal range and within 360 days    Platelets  Date Value Ref Range Status  01/18/2020 199 150 - 400 K/uL Final  02/02/2019 207 150 - 450 x10E3/uL Final          Passed - Valid encounter within last 12 months    Recent Outpatient Visits           3 months ago Type II diabetes mellitus with complication [facility])   [facility] Glean Hess, MD   6 months ago Type II diabetes mellitus with complication [facility])   [facility] Glean Hess, MD   8 months ago Type II diabetes mellitus with complication [facility])   [facility] Glean Hess, MD   11 months ago Annual physical exam   [facility] Glean Hess, MD   1 year ago Chronic maxillary sinusitis   [facility] Juline Patch, MD       Future Appointments             In 2 weeks Glean Hess, MD [facility] [place]    In 1 month Ralene Bathe, MD [place]               rosuvastatin (CRESTOR) 20 MG tablet 90 tablet 3    Sig: Take 1 tablet (20 mg total) by mouth at bedtime.      Cardiovascular:  Antilipid - Statins Failed - 01/26/2020 11:18 AM      Failed - LDL in normal range and within 360 days    LDL Chol Calc (NIH)  Date Value Ref Range Status  02/02/2019 80 0 - 99 mg/dL Final          Failed - HDL in normal range and within 360 days    HDL  Date Value Ref Range Status  02/02/2019 36 (L) >39 mg/dL Final          Failed - Triglycerides in normal range and within 360 days    Triglycerides  Date Value Ref Range Status  02/02/2019 179 (  H) 0 - 149 mg/dL Final          Passed - Total Cholesterol in normal range and within 360 days    Cholesterol, Total  Date Value Ref Range Status  02/02/2019 147 100 - 199 mg/dL Final          Passed - Patient is not pregnant      Passed - Valid encounter within last 12 months    Recent Outpatient Visits           3 months ago Type II diabetes mellitus with complication [facility])   [facility] Glean Hess, MD   6 months ago Type II diabetes mellitus with [facility])   [facility] Glean Hess, MD   8 months ago Type II diabetes mellitus with complication [facility])   [facility] Glean Hess, MD   11 months ago Annual physical exam   [facility] Glean Hess, MD   1 year ago Chronic maxillary sinusitis   [facility] Juline Patch, MD       Future Appointments             In 2 weeks Glean Hess, MD [facility] [place]   In 1 month Ralene Bathe, MD [place]              Signed Prescriptions Disp Refills   sitaGLIPtin (JANUVIA) 100 MG tablet 90 tablet 1    Sig: Take 1 tablet (100 mg total) by mouth daily.      Endocrinology:  Diabetes - DPP-4 Inhibitors Failed - 01/26/2020 11:18 AM      Failed - Cr in normal  range and within 360 days    Creatinine, Ser  Date Value Ref Range Status  01/18/2020 1.37 (H) 0.61 - 1.24 mg/dL Final          Passed - HBA1C is between 0 and 7.9 and within 180 days    Hgb A1c MFr Bld  Date Value Ref Range Status  01/18/2020 6.1 (H) 4.8 - 5.6 % Final    Comment:    (NOTE) Pre diabetes:          5.7%-6.4%  Diabetes:              >6.4%  Glycemic control for   <7.0% adults with diabetes           Passed - Valid encounter within last 6 months    Recent Outpatient Visits           3 months ago Type II diabetes mellitus with complication [facility])   [facility] Glean Hess, MD   6 months ago Type II diabetes mellitus with complication [facility])   [facility] Glean Hess, MD   8 months ago Type II diabetes mellitus with complication [facility])   [facility] Glean Hess, MD   11 months ago Annual physical exam   [facility] Glean Hess, MD   1 year ago Chronic maxillary sinusitis   [facility] Juline Patch, MD       Future Appointments             In 2 weeks [name] Jesse Sans, MD [facility] [place]   In 1 month Ralene Bathe, MD [place]

## 2020-01-27 ENCOUNTER — Inpatient Hospital Stay: Admission: RE | Admit: 2020-01-27 | Payer: Medicare HMO | Source: Home / Self Care | Admitting: Orthopedic Surgery

## 2020-01-30 ENCOUNTER — Encounter: Payer: Self-pay | Admitting: Dermatology

## 2020-01-30 MED ORDER — ELIQUIS 5 MG PO TABS: 5.0000 mg | ORAL_TABLET | Freq: Two times a day (BID) | ORAL | 5 refills | Status: DC

## 2020-01-31 ENCOUNTER — Ambulatory Visit: Payer: Self-pay | Admitting: Pharmacist

## 2020-01-31 DIAGNOSIS — E118 Type 2 diabetes mellitus with unspecified complications: Secondary | ICD-10-CM

## 2020-01-31 DIAGNOSIS — I4819 Other persistent atrial fibrillation: Secondary | ICD-10-CM

## 2020-01-31 NOTE — Chronic Care Management (AMB) (Signed)
Chronic Care Management Pharmacy  Name: Maxwell Stanley  MRN: [id number] DOB: Aug 03, 1934  Chief Complaint/ HPI  Maxwell Stanley,  86 y.o. , male presents for their Follow-Up CCM visit with the clinical pharmacist via telephone.  PCP : Glean Hess, MD  Their chronic conditions include: Hypertension, Hyperlipidemia, Diabetes, Atrial Fibrillation, GERD, Chronic Kidney Disease, Osteoarthritis, BPH and Allergic Rhinitis  Office Visits: 11/23/19- Clemetine Marker, AW[facility]- 09/29/19- Dr. [name] - inc. Metformin for 3 tabs qhs   Consult Visit: 11/28/19- Dr. Ivor Costa, Dermatology- cryotherapy multiple sites on skin 10/26/19- PA Wilhemena Durie- Gel-one injection (viscosupplement) knee 10/19/19- Dr. Nehemiah Massed, Cardiology - can hold Eliquis 3 days prior to surgery TKR 10/04/19- Dr. Marry Guan, Orthopaedics- TKR rt   Medications: Outpatient Encounter Medications as of 01/31/2020  Medication Sig  . acetaminophen (TYLENOL) 500 MG tablet Take 1,000 mg by mouth daily.  [name] amLODipine (NORVASC) 2.5 MG tablet Take 1 tablet (2.5 mg total) by mouth daily.  [name] atenolol (TENORMIN) 50 MG tablet TAKE 1 TABLET AT BEDTIME (Patient taking differently: Take 50 mg by mouth at bedtime.)  . celecoxib (CELEBREX) 200 MG capsule Take 200 mg by mouth daily. Dr. Marry Guan  . cholecalciferol (VITAMIN D) 1000 UNITS tablet Take 1,000 Units by mouth at bedtime.   . docusate sodium (COLACE) 100 MG capsule Take 200 mg by mouth daily.   [name] ELIQUIS 5 MG TABS tablet Take 1 tablet (5 mg total) by mouth 2 (two) times daily.  . finasteride (PROSCAR) 5 MG tablet TAKE 1 TABLET DAILY (Patient taking differently: Take 5 mg by mouth daily.)  . fluticasone (FLONASE) 50 MCG/ACT nasal spray USE 2 SPRAYS IN EACH       NOSTRIL DAILY (Patient taking differently: Place 2 sprays into both nostrils daily as needed for allergies.)  . glucose blood (ONE TOUCH ULTRA TEST) test strip Use to test blood sugar once daily.  . hydrochlorothiazide  (MICROZIDE) 12.5 MG capsule TAKE 1 CAPSULE DAILY (IN   PLACE OF COMBINATION       TABLET) (Patient taking differently: Take 12.5 mg by mouth daily.)  . losartan (COZAAR) 100 MG tablet Take 0.5 tablets (50 mg total) by mouth 2 (two) times daily.  . metFORMIN (GLUCOPHAGE-XR) 500 MG 24 hr tablet Take 3 tablets (1,500 mg total) by mouth at bedtime. (Patient taking differently: Take 500-1,000 mg by mouth See admin instructions. Take 1 tablet (500 mg) by mouth in the morning & take 2 tablets (1000 mg) by mouth at night.)  . Misc Natural Products (OSTEO BI-FLEX TRIPLE STRENGTH) TABS Take 1 tablet by mouth 2 (two) times daily.   . Multiple Vitamin (MULTIVITAMIN WITH MINERALS) TABS tablet Take 0.5 tablets by mouth in the morning and at bedtime.  [name] Delica Lancets 85I MISC 1 each by Does not apply route daily. Use to test blood sugar once daily.  . pantoprazole (PROTONIX) 40 MG tablet TAKE 1 TABLET DAILY (Patient taking differently: Take 40 mg by mouth daily.)  . Probiotic Product (PROBIOTIC DAILY PO) Take 1 tablet by mouth daily.  . rosuvastatin (CRESTOR) 20 MG tablet Take 1 tablet (20 mg total) by mouth at bedtime.  . sitaGLIPtin (JANUVIA) 100 MG tablet Take 1 tablet (100 mg total) by mouth daily.  . tamsulosin (FLOMAX) 0.4 MG CAPS capsule TAKE 1 CAPSULE DAILY AFTER BREAKFAST (Patient taking differently: Take 0.4 mg by mouth daily after breakfast.)  . clotrimazole (LOTRIMIN) 1 % cream Apply 1 application topically 2 (two) times daily.  . mupirocin ointment (  BACTROBAN) 2 % Apply 1 application topically daily. (Patient taking differently: Apply 1 application topically daily as needed (wound care.).)   No facility-administered encounter medications on file as of 01/31/2020.    Current Diagnosis/Assessment:    Goals Addressed            This Visit's Progress   . Pharmacy Care plan       CARE PLAN ENTRY (see longitudinal plan of care for additional care plan information)  Current Barriers:   . Chronic Disease Management support, education, and care coordination needs related to Hypertension, Hyperlipidemia, Diabetes, Atrial Fibrillation, GERD, Chronic Kidney Disease, Osteoarthritis, and BPH   Hypertension/ Atrial fibrillation BP Readings from Last 3 Encounters:  01/18/20 126/71  11/23/19 108/62  09/29/19 110/68   . Pharmacist Clinical Goal(s): o Over the next 90 days, patient will work with PharmD and providers to maintain BP goal <130/80 . Current regimen:  . Amlodipine 2.5 mg qd . Atenolol 50 mg qhs . Losartan 50 mg bid . HCTZ 12.5 mg qd . Eliquis 5 mg bid (last scr 1.37) . Interventions: o Provided diet and exercise counseling o Urgent request to CPA for PAP forms to be mailed to patient o Reviewed signs/symptoms of bleeding o Reviewed fill history . Patient self care activities - Over the next 90 days, patient will: o Check BP1-2 times weekly, document, and provide at future appointments o Ensure daily salt intake < 2300 mg/day o Avoid NSAIDS o Contact PharmD if PAP not received in 5-7 days o Complete and return PAP   Diabetes Lab Results  Component Value Date/Time   HGBA1C 6.1 (H) 01/18/2020 07:42 AM   HGBA1C 7.7 (H) 09/29/2019 08:49 AM   . Pharmacist Clinical Goal(s): o Over the next 90 days, patient will work with PharmD and providers to maintain A1c goal <7% . Current regimen:  . Metformin XR 1500 mg qhs . Januvia 100 mg qd  . Interventions: o Praised patient for attaining goal A1c o Reviewed symptoms of hypoglycemia o Urgent request to CPA for mailing of PAP application . Patient self care activities - Over the next 90  days, patient will: o Check blood sugar once daily, document, and provide at future appointments o Contact provider with any episodes of hypoglycemia o Provide required portion of PAP o Contact PharmD if PAP not received in 5-7 days  Medication management . Pharmacist Clinical Goal(s): o Over the next 90 days, patient will  work with PharmD and providers to maintain optimal medication adherence . [facility]   . Interventions o Comprehensive medication review performed. o Continue current medication management strategy . Patient self care activities - Over the next 90 days, patient will: o Focus on medication adherence by fill dates  o Take medications as prescribed o Report any questions or concerns to PharmD and/or provider(s)  Please see past updates related to this goal by clicking on the "Past Updates" button in the selected goal          Diabetes   A1c goal <7%  Recent Relevant Labs: Lab Results  Component Value Date/Time   HGBA1C 6.1 (H) 01/18/2020 07:42 AM   HGBA1C 7.7 (H) 09/29/2019 08:49 AM    Last diabetic Eye exam:  Lab Results  Component Value Date/Time   HMDIABEYEEXA No Retinopathy 12/13/2019 12:00 AM    Last diabetic Foot exam: No results found for: HMDIABFOOTEX   Checking BG: Daily  Most recent eGFR`37m/min  Recent FBG Readings: 137, 183, 127  Patient has failed these meds in past: NA Patient is currently controlled on the following medications: [name] Metformin XR 1500 mg qhs . Januvia 100 mg qd   We discussed: diet and exercise extensively. Patient and spouse prepare most meals at home and do not follow any dietary restrictions other than moderation. He has not been using his recumbent bike due to pain in his knee. He still remains active and was working on his Conservation officer, nature to day. .Denies any episodes of hypoglycemia.  He is disappointed that his  TKR has been postponed due to COVID and close OR. He is hoping to get it rescheduled soon. He is proud of his most recent  A1c and hs lost 10 lbs.  Patient never received PAP applications in November. Verified that CPA mailed from [place] today and instructed patient to call me directly if he does not receive by first of next week.   Plan  Continue current medications.  Hypertension   BP goal is:   <130/80  Office blood pressures are  BP Readings from Last 3 Encounters:  01/18/20 126/71  11/23/19 108/62  09/29/19 110/68   BMP Latest Ref Rng & Units 01/18/2020 05/19/2019 02/02/2019  Glucose 70 - 99 mg/dL 212(H) 193(H) 149(H)  BUN 8 - 23 mg/dL 23 27 24   Creatinine 0.61 - 1.24 mg/dL 1.37(H) 1.38(H) 1.36(H)  BUN/Creat Ratio 10 - 24 - 20 18  Sodium 135 - 145 mmol/L 137 141 141  Potassium 3.5 - 5.1 mmol/L 4.1 4.7 4.4  Chloride 98 - 111 mmol/L 103 102 100  CO2 22 - 32 mmol/L 22 22 25   Calcium 8.9 - 10.3 mg/dL 9.4 9.9 9.7    Patient checks BP at home weekly  Patient has failed these meds in the past: NA Patient is currently controlled on the following medications:  . Amlodipine 2.5 mg qd . Atenolol 50 mg qhs . Losartan 50 mg bid . HCTZ 12.5 mg qd  We discussed diet and exercise extensively. Patient has a BP cuff at home and has been checking  He is followed by cardiology. Denies any dizziness/light-headedness.  Plan  Continue current medications       AFIB   Patient is currently rate controlled. Office heart rates are  Pulse Readings from Last 3 Encounters:  01/18/20 80  11/23/19 76  09/29/19 66    CHA2DS2-VASc Score = 4  The patient's score is based upon: CHF History: No HTN History: Yes Diabetes History: Yes Stroke History: No Vascular Disease History: No Age Score: 2 Gender Score: 0   The patient is at significant risk for stroke/thromboembolism based upon his CHA2DS2-VASc Score of 4.  Continue Apixaban (Eliquis) 5 mg bid (last Scr 1.37)   We discussed:  Signs and symptoms of bleeding. Patient denies.Patient assistance application mailed today Will fax to Dr.  Serafina Royals for signature and submission after patient portion received. Patient has not missed any doses and has 90 day supply on hand.  Plan  Continue current medications.       Osteoarthritis   Patient has failed these meds in past: NA Patient is currentlyquery  controlled on the  following medications:  . Celebrex 200 mg qd . Acetaminophen 1000 mg prn pain . Osteo-Biflex 1 tab bid  We discussed:  Patient is awaiting a surgery date for rt TKR. He reports receiving "gel" injection back in October prior to a wedding that has since worn off. He is awaiting reschedule of TKR with Dr. Marry Guan. Originally scheduled for  this month but canceled due to COVID.  Plan  Continue current medications        Health Maintenance  No results found for: VD25OH   . Vitamin D 1000 units daily . Docusate sodium 200 mg daily . Probiotic daily . Multivitamin senior daily . Osteo Biflex 1 tab bid . Clotrimazole to feet as needed  We discussed:  Patient reports nephrology starting Vit d supplementation several years earlier. He does not follow with them. No level in chart.  Plan  Continue current supplements. Recommend follow- up Vitamin D and Vit B12 (due to long term metformin) levels with next labs.   Vaccines   Reviewed and discussed patient's vaccination history.    Immunization History  Administered Date(s) Administered  . Fluad Quad(high Dose 65+) 09/16/2018, 09/27/2019  . Influenza, High Dose Seasonal PF 10/13/2016, 09/09/2017  . Influenza,inj,Quad PF,6+ Mos 09/26/2014  . Influenza-Unspecified 09/26/2014, 10/13/2016  . PFIZER(Purple Top)SARS-COV-2 Vaccination 01/22/2019, 02/13/2019  . Pneumococcal Conjugate-13 10/26/2013  . Pneumococcal Polysaccharide-23 01/31/2011  . Tdap 10/03/2010  . Zoster 04/16/2000  . Zoster Recombinat (Shingrix) 08/12/2017, 10/22/2017    Plan  Vaccines recommendations up to date. He has received COVID booster  Medication Management   [facility] Uses pill box? No - has a tote with vials labeled AM/PM on tops. Pt endorses 95% compliance  We discussed: Current pharmacy is preferred with insurance plan and patient is satisfied with pharmacy services  Plan  Continue current medication  management strategy    Follow up: 3  month phone visit   Junita Push. Kenton Kingfisher PharmD, [facility] [phone]

## 2020-01-31 NOTE — Patient Instructions (Addendum)
Visit Information  It was a pleasure speaking with you today. Thank you for letting me be part of your clinical team. Please call with any questions or concerns.   Goals Addressed            This Visit's Progress   . Pharmacy Care plan       CARE PLAN ENTRY (see longitudinal plan of care for additional care plan information)  Current Barriers:  . Chronic Disease Management support, education, and care coordination needs related to Hypertension, Hyperlipidemia, Diabetes, Atrial Fibrillation, GERD, Chronic Kidney Disease, Osteoarthritis, and BPH   Hypertension/ Atrial fibrillation BP Readings from Last 3 Encounters:  01/18/20 126/71  11/23/19 108/62  09/29/19 110/68   . Pharmacist Clinical Goal(s): o Over the next 90 days, patient will work with PharmD and providers to maintain BP goal <130/80 . Current regimen:  . Amlodipine 2.5 mg qd . Atenolol 50 mg qhs . Losartan 50 mg bid . HCTZ 12.5 mg qd . Eliquis 5 mg bid (last scr 1.37) . Interventions: o Provided diet and exercise counseling o Urgent request to CPA for PAP forms to be mailed to patient o Reviewed signs/symptoms of bleeding o Reviewed fill history . Patient self care activities - Over the next 90 days, patient will: o Check BP1-2 times weekly, document, and provide at future appointments o Ensure daily salt intake < 2300 mg/day o Avoid NSAIDS o Contact PharmD if PAP not received in 5-7 days o Complete and return PAP   Diabetes Lab Results  Component Value Date/Time   HGBA1C 6.1 (H) 01/18/2020 07:42 AM   HGBA1C 7.7 (H) 09/29/2019 08:49 AM   . Pharmacist Clinical Goal(s): o Over the next 90 days, patient will work with PharmD and providers to maintain A1c goal <7% . Current regimen:  . Metformin XR 1500 mg qhs . Januvia 100 mg qd  . Interventions: o Praised patient for attaining goal A1c o Reviewed symptoms of hypoglycemia o Urgent request to CPA for mailing of PAP application . Patient self care  activities - Over the next 90  days, patient will: o Check blood sugar once daily, document, and provide at future appointments o Contact provider with any episodes of hypoglycemia o Provide required portion of PAP o Contact PharmD if PAP not received in 5-7 days  Medication management . Pharmacist Clinical Goal(s): o Over the next 90 days, patient will work with PharmD and providers to maintain optimal medication adherence . [facility]   . Interventions o Comprehensive medication review performed. o Continue current medication management strategy . Patient self care activities - Over the next 90 days, patient will: o Focus on medication adherence by fill dates  o Take medications as prescribed o Report any questions or concerns to PharmD and/or provider(s)  Please see past updates related to this goal by clicking on the "Past Updates" button in the selected goal         The patient verbalized understanding of instructions, educational materials, and care plan provided today and agreed to receive a mailed copy of patient instructions, educational materials, and care plan.   Telephone follow up appointment with pharmacy team member scheduled for: 3 months with PharmD.   Junita Push. Kenton Kingfisher PharmD, BCPS Clinical Pharmacist [phone]  Hypertension, Adult Hypertension is another name for high blood pressure. High blood pressure forces your heart to work harder to pump blood. This can cause problems over time. There are two numbers in a blood pressure reading. There is  a top number (systolic) over a bottom number (diastolic). It is best to have a blood pressure that is below 120/80. Healthy choices can help lower your blood pressure, or you may need medicine to help lower it. What are the causes? The cause of this condition is not known. Some conditions may be related to high blood pressure. What increases the risk?  Smoking.  Having type 2 diabetes mellitus,  high cholesterol, or both.  Not getting enough exercise or physical activity.  Being overweight.  Having too much fat, sugar, calories, or salt (sodium) in your diet.  Drinking too much alcohol.  Having long-term (chronic) kidney disease.  Having a family history of high blood pressure.  Age. Risk increases with age.  Race. You may be at higher risk if you are African American.  Gender. Men are at higher risk than women before age 78. After age 49, women are at higher risk than men.  Having obstructive sleep apnea.  Stress. What are the signs or symptoms?  High blood pressure may not cause symptoms. Very high blood pressure (hypertensive crisis) may cause: ? Headache. ? Feelings of worry or nervousness (anxiety). ? Shortness of breath. ? Nosebleed. ? A feeling of being sick to your stomach (nausea). ? Throwing up (vomiting). ? Changes in how you see. ? Very bad chest pain. ? Seizures. How is this treated?  This condition is treated by making healthy lifestyle changes, such as: ? Eating healthy foods. ? Exercising more. ? Drinking less alcohol.  Your health care provider may prescribe medicine if lifestyle changes are not enough to get your blood pressure under control, and if: ? Your top number is above 130. ? Your bottom number is above 80.  Your personal target blood pressure may vary. Follow these instructions at home: Eating and drinking  If told, follow the DASH eating plan. To follow this plan: ? Fill one half of your plate at each meal with fruits and vegetables. ? Fill one fourth of your plate at each meal with whole grains. Whole grains include whole-wheat pasta, brown rice, and whole-grain bread. ? Eat or drink low-fat dairy products, such as skim milk or low-fat yogurt. ? Fill one fourth of your plate at each meal with low-fat (lean) proteins. Low-fat proteins include fish, chicken without skin, eggs, beans, and tofu. ? Avoid fatty meat, cured and  processed meat, or chicken with skin. ? Avoid pre-made or processed food.  Eat less than 1,500 mg of salt each day.  Do not drink alcohol if: ? Your doctor tells you not to drink. ? You are pregnant, may be pregnant, or are planning to become pregnant.  If you drink alcohol: ? Limit how much you use to:  0-1 drink a day for women.  0-2 drinks a day for men. ? Be aware of how much alcohol is in your drink. In the [place] one drink equals one 12 oz bottle of beer (355 mL), one 5 oz glass of wine (148 mL), or one 1 oz glass of hard liquor (44 mL).   Lifestyle  Work with your doctor to stay at a healthy weight or to lose weight. Ask your doctor what the best weight is for you.  Get at least 30 minutes of exercise most days of the week. This may include walking, swimming, or biking.  Get at least 30 minutes of exercise that strengthens your muscles (resistance exercise) at least 3 days a week. This may include lifting weights or doing  Pilates.  Do not use any products that contain nicotine or tobacco, such as cigarettes, e-cigarettes, and chewing tobacco. If you need help quitting, ask your doctor.  Check your blood pressure at home as told by your doctor.  Keep all follow-up visits as told by your doctor. This is important.   Medicines  Take over-the-counter and prescription medicines only as told by your doctor. Follow directions carefully.  Do not skip doses of blood pressure medicine. The medicine does not work as well if you skip doses. Skipping doses also puts you at risk for problems.  Ask your doctor about side effects or reactions to medicines that you should watch for. Contact a doctor if you:  Think you are having a reaction to the medicine you are taking.  Have headaches that keep coming back (recurring).  Feel dizzy.  Have swelling in your ankles.  Have trouble with your vision. Get help right away if you:  Get a very bad headache.  Start to feel mixed up  (confused).  Feel weak or numb.  Feel faint.  Have very bad pain in your: ? Chest. ? Belly (abdomen).  Throw up more than once.  Have trouble breathing. Summary  Hypertension is another name for high blood pressure.  High blood pressure forces your heart to work harder to pump blood.  For most people, a normal blood pressure is less than 120/80.  Making healthy choices can help lower blood pressure. If your blood pressure does not get lower with healthy choices, you may need to take medicine. This information is not intended to replace advice given to you by your health care provider. Make sure you discuss any questions you have with your health care provider. Document Revised: 09/02/2017 Document Reviewed: 09/02/2017 Elsevier Patient Education  2021 [employer].

## 2020-02-06 ENCOUNTER — Encounter: Payer: Medicare HMO | Admitting: Internal Medicine

## 2020-02-13 ENCOUNTER — Ambulatory Visit: Payer: Medicare HMO | Admitting: Dermatology

## 2020-02-15 ENCOUNTER — Encounter: Payer: Self-pay | Admitting: Internal Medicine

## 2020-02-15 ENCOUNTER — Other Ambulatory Visit: Payer: Self-pay

## 2020-02-15 ENCOUNTER — Ambulatory Visit (INDEPENDENT_AMBULATORY_CARE_PROVIDER_SITE_OTHER): Payer: Medicare HMO | Admitting: Internal Medicine

## 2020-02-15 VITALS — BP 128/70 | HR 68 | Ht 74.0 in | Wt 188.0 lb

## 2020-02-15 DIAGNOSIS — I10 Essential (primary) hypertension: Secondary | ICD-10-CM

## 2020-02-15 DIAGNOSIS — R3912 Poor urinary stream: Secondary | ICD-10-CM

## 2020-02-15 DIAGNOSIS — N401 Enlarged prostate with lower urinary tract symptoms: Secondary | ICD-10-CM

## 2020-02-15 DIAGNOSIS — Z0000 Encounter for general adult medical examination without abnormal findings: Secondary | ICD-10-CM | POA: Diagnosis not present

## 2020-02-15 DIAGNOSIS — E1169 Type 2 diabetes mellitus with other specified complication: Secondary | ICD-10-CM

## 2020-02-15 DIAGNOSIS — I4819 Other persistent atrial fibrillation: Secondary | ICD-10-CM

## 2020-02-15 DIAGNOSIS — E118 Type 2 diabetes mellitus with unspecified complications: Secondary | ICD-10-CM | POA: Diagnosis not present

## 2020-02-15 DIAGNOSIS — E785 Hyperlipidemia, unspecified: Secondary | ICD-10-CM

## 2020-02-15 DIAGNOSIS — N183 Chronic kidney disease, stage 3 unspecified: Secondary | ICD-10-CM

## 2020-02-15 DIAGNOSIS — E1122 Type 2 diabetes mellitus with diabetic chronic kidney disease: Secondary | ICD-10-CM | POA: Diagnosis not present

## 2020-02-15 DIAGNOSIS — D6869 Other thrombophilia: Secondary | ICD-10-CM | POA: Insufficient documentation

## 2020-02-15 MED ORDER — HYDROCHLOROTHIAZIDE 12.5 MG PO CAPS: 12.5000 mg | ORAL_CAPSULE | Freq: Every day | ORAL | 1 refills | Status: DC

## 2020-02-15 NOTE — Progress Notes (Signed)
Date:  02/15/2020   Name:  Maxwell Stanley   DOB:  17-Mar-1934   MRN:  [id number]   Chief Complaint: Annual Exam (Foot exam. )  Maxwell Stanley is a 86 y.o. male who presents today for his Complete Annual Exam. He feels fairly well. He reports exercising - none. He reports he is sleeping fairly well.   Colonoscopy: aged out  Immunization History  Administered Date(s) Administered  . Fluad Quad(high Dose 65+) 09/16/2018, 09/27/2019  . Influenza, High Dose Seasonal PF 10/13/2016, 09/09/2017  . Influenza,inj,Quad PF,6+ Mos 09/26/2014  . Influenza-Unspecified 09/26/2014, 10/13/2016  . PFIZER(Purple Top)SARS-COV-2 Vaccination 01/22/2019, 02/13/2019, 10/20/2019  . Pneumococcal Conjugate-13 10/26/2013  . Pneumococcal Polysaccharide-23 01/31/2011  . Tdap 10/03/2010  . Zoster 04/16/2000  . Zoster Recombinat (Shingrix) 08/12/2017, 10/22/2017    Diabetes He presents for his follow-up diabetic visit. He has type 2 diabetes mellitus. His disease course has been stable. Pertinent negatives for hypoglycemia include no dizziness or headaches. Pertinent negatives for diabetes include no chest pain and no fatigue. Current diabetic treatments: metformin and januvia. He is compliant with treatment all of the time. He is following a generally healthy diet. An ACE inhibitor/angiotensin II receptor blocker is being taken.  Hypertension This is a chronic problem. The problem is controlled. Pertinent negatives include no chest pain, headaches, palpitations or shortness of breath. Past treatments include angiotensin blockers, beta blockers, calcium channel blockers and diuretics. The current treatment provides significant improvement. Hypertensive end-organ damage includes CAD/MI. Kidney disease: Afib.  Hyperlipidemia This is a chronic problem. The problem is controlled. Pertinent negatives include no chest pain, focal weakness, myalgias or shortness of breath. Current antihyperlipidemic treatment  includes statins.  Afib - followed by cardiology.  On beta blocker for rate control, Eliquis for anticoagulation.  No bleeding issues. Knee OA - surgery was postponed due to covid.  He is in pain and angry that he can not have it yet.  Lab Results  Component Value Date   CREATININE 1.37 (H) 01/18/2020   BUN 23 01/18/2020   NA 137 01/18/2020   K 4.1 01/18/2020   CL 103 01/18/2020   CO2 22 01/18/2020   Lab Results  Component Value Date   CHOL 147 02/02/2019   HDL 36 (L) 02/02/2019   LDLCALC 80 02/02/2019   TRIG 179 (H) 02/02/2019   CHOLHDL 4.1 02/02/2019   Lab Results  Component Value Date   TSH 2.920 02/02/2019   Lab Results  Component Value Date   HGBA1C 6.1 (H) 01/18/2020   Lab Results  Component Value Date   WBC 7.5 01/18/2020   HGB 11.9 (L) 01/18/2020   HCT 34.3 (L) 01/18/2020   MCV 96.9 01/18/2020   PLT 199 01/18/2020   Lab Results  Component Value Date   ALT 18 01/18/2020   AST 23 01/18/2020   ALKPHOS 69 01/18/2020   BILITOT 0.9 01/18/2020   Lab Results  Component Value Date   PSA1 0.6 02/02/2019   PSA1 0.9 12/22/2017   PSA 1.83 04/10/2017      Review of Systems  Constitutional: Negative for appetite change, chills, diaphoresis, fatigue and unexpected weight change.  HENT: Negative for hearing loss, tinnitus, trouble swallowing and voice change.   Eyes: Negative for visual disturbance.  Respiratory: Negative for choking, shortness of breath and wheezing.   Cardiovascular: Negative for chest pain, palpitations and leg swelling.  Gastrointestinal: Negative for abdominal pain, blood in stool, constipation and diarrhea.  Genitourinary: Negative for difficulty urinating, dysuria and  frequency.  Musculoskeletal: Negative for arthralgias, back pain and myalgias.  Skin: Negative for color change and rash.  Neurological: Negative for dizziness, focal weakness, syncope and headaches.  Hematological: Negative for adenopathy.  Psychiatric/Behavioral: Negative  for dysphoric mood and sleep disturbance.    Patient Active Problem List   Diagnosis Date Noted  . Acquired thrombophilia ([place]) 02/15/2020  . Primary osteoarthritis of both knees 05/19/2019  . Gastroesophageal reflux disease with esophagitis 02/02/2019  . Personal history of other malignant neoplasm of skin 12/17/2018  . Persistent atrial fibrillation ([place]) 10/29/2018  . Reducible right inguinal hernia 09/16/2018  . DDD (degenerative disc disease), lumbar 12/23/2017  . Primary osteoarthritis of right hip 12/23/2017  . Onychomycosis of toenail 08/19/2017  . Allergic rhinitis 08/08/2016  . Carpal tunnel syndrome on left 12/12/2015  . Chronic maxillary sinusitis 04/09/2015  . Type II diabetes mellitus with complication ([place]) 16/10/9602  . Chronic low back pain 09/21/2014  . Abnormal prostate specific antigen 04/17/2014  . Enlarged prostate with lower urinary tract symptoms (LUTS) 04/17/2014  . CKD stage 3 secondary to diabetes ([place]) 04/17/2014  . Hyperlipidemia associated with type 2 diabetes mellitus ([place]) 04/17/2014  . 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 Essential (primary) hypertension 04/17/2014  . Back muscle spasm 04/17/2014  . Degenerative arthritis of finger 04/17/2014  . Breast development in males 04/17/2014  . 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 EDEMA 08/24/2009    Allergies  Allergen Reactions  . Codeine Other (See Comments)    Made his "Wild"  . Ceftriaxone Rash  . Penicillin G Rash    Did it involve swelling of the face/tongue/throat, SOB, or low BP? No Did it involve sudden or severe rash/hives, skin peeling, or any reaction on the inside of your mouth or nose? No Did you need to seek medical attention at a hospital or doctor's office? No When did it last happen? If all above answers are "NO", may proceed with cephalosporin use.  IgE = 35 (WNL) on 01/18/2020     Past Surgical History:  Procedure Laterality Date  . ANKLE ARTHROSCOPY WITH  OPEN REDUCTION INTERNAL FIXATION (ORIF) Left 1998  . COLONOSCOPY    . HEMORRHOID SURGERY    . HERNIA REPAIR Left 5409   umbilical and left inguinal  . INGUINAL HERNIA REPAIR Right 11/12/2018   Procedure: HERNIA REPAIR INGUINAL ADULT;  Surgeon: Fredirick Maudlin, MD;  Location: [facility] ORS;  Service: General;  Laterality: Right;  . INGUINAL HERNIA REPAIR Left 2012  . KNEE ARTHROSCOPY Right 2006  . skin cancer resection Right 04/07/2018   ON hand.  [name] THUMB ARTHROSCOPY Right 2013   tendon flap to joint  . TONSILLECTOMY    . UPPER GI ENDOSCOPY      Social History   Tobacco Use  . Smoking status: Never Smoker  . Smokeless tobacco: Never Used  Vaping Use  . Vaping Use: Never used  Substance Use Topics  . Alcohol use: No    Alcohol/week: 0.0 standard drinks  . Drug use: No     Medication list has been reviewed and updated.  Current Meds  Medication Sig  . acetaminophen (TYLENOL) 500 MG tablet Take 1,000 mg by mouth daily.  [name] amLODipine (NORVASC) 2.5 MG tablet Take 1 tablet (2.5 mg total) by mouth daily.  [name] atenolol (TENORMIN) 50 MG tablet TAKE 1 TABLET AT BEDTIME (Patient taking differently: Take 50 mg by mouth at bedtime.)  . celecoxib (CELEBREX) 200 MG capsule Take 200 mg by mouth daily.  Dr. Marry Guan  . cholecalciferol (VITAMIN D) 1000 UNITS tablet Take 1,000 Units by mouth at bedtime.   . clotrimazole (LOTRIMIN) 1 % cream Apply 1 application topically 2 (two) times daily.  [name] docusate sodium (COLACE) 100 MG capsule Take 200 mg by mouth daily.   [name] ELIQUIS 5 MG TABS tablet Take 1 tablet (5 mg total) by mouth 2 (two) times daily.  . finasteride (PROSCAR) 5 MG tablet TAKE 1 TABLET DAILY (Patient taking differently: Take 5 mg by mouth daily.)  . fluticasone (FLONASE) 50 MCG/ACT nasal spray USE 2 SPRAYS IN EACH       NOSTRIL DAILY (Patient taking differently: Place 2 sprays into both nostrils daily as needed for allergies.)  . glucose blood (ONE TOUCH ULTRA TEST) test strip Use to test  blood sugar once daily.  [name] losartan (COZAAR) 100 MG tablet Take 0.5 tablets (50 mg total) by mouth 2 (two) times daily.  . metFORMIN (GLUCOPHAGE-XR) 500 MG 24 hr tablet Take 3 tablets (1,500 mg total) by mouth at bedtime.  . Misc Natural Products (OSTEO BI-FLEX TRIPLE STRENGTH) TABS Take 1 tablet by mouth 2 (two) times daily.   . Multiple Vitamin (MULTIVITAMIN WITH MINERALS) TABS tablet Take 0.5 tablets by mouth in the morning and at bedtime.  . mupirocin ointment (BACTROBAN) 2 % Apply 1 application topically daily. (Patient taking differently: Apply 1 application topically daily as needed (wound care.).)  . OneTouch Delica Lancets 83M MISC 1 each by Does not apply route daily. Use to test blood sugar once daily.  . pantoprazole (PROTONIX) 40 MG tablet TAKE 1 TABLET DAILY (Patient taking differently: Take 40 mg by mouth daily.)  . Probiotic Product (PROBIOTIC DAILY PO) Take 1 tablet by mouth daily.  . rosuvastatin (CRESTOR) 20 MG tablet Take 1 tablet (20 mg total) by mouth at bedtime.  . sitaGLIPtin (JANUVIA) 100 MG tablet Take 1 tablet (100 mg total) by mouth daily. (Patient taking differently: Take 50 mg by mouth daily.)  . tamsulosin (FLOMAX) 0.4 MG CAPS capsule TAKE 1 CAPSULE DAILY AFTER BREAKFAST (Patient taking differently: Take 0.4 mg by mouth daily after breakfast.)  . [DISCONTINUED] hydrochlorothiazide (MICROZIDE) 12.5 MG capsule TAKE 1 CAPSULE DAILY (IN   PLACE OF COMBINATION       TABLET) (Patient taking differently: Take 12.5 mg by mouth daily.)    PHQ 2/9 Scores 02/15/2020 11/23/2019 07/13/2019 05/19/2019  PHQ - 2 Score 4 0 0 0  PHQ- 9 Score 6 - 3 0    GAD 7 : Generalized Anxiety Score 02/15/2020 07/13/2019 05/19/2019 12/27/2018  Nervous, Anxious, on Edge 0 0 0 0  Control/stop worrying 0 0 0 0  Worry too much - different things 0 0 0 0  Trouble relaxing 0 0 0 0  Restless 0 0 0 0  Easily annoyed or irritable 0 0 0 0  Afraid - awful might happen 0 0 0 0  Total GAD 7 Score 0 0 0 0   Anxiety Difficulty Not difficult at all Not difficult at all Not difficult at all -    BP Readings from Last 3 Encounters:  02/15/20 128/70  01/18/20 126/71  11/23/19 108/62    Physical Exam Vitals and nursing note reviewed.  Constitutional:      Appearance: Normal appearance. He is well-developed.  HENT:     Head: Normocephalic.     Right Ear: Tympanic membrane, ear canal and external ear normal.     Left Ear: Tympanic membrane, ear canal and external ear normal.  Nose: Nose normal.  Eyes:     Conjunctiva/sclera: Conjunctivae normal.     Pupils: Pupils are equal, round, and reactive to light.  Neck:     Thyroid: No thyromegaly.     Vascular: No carotid bruit.  Cardiovascular:     Rate and Rhythm: Normal rate and regular rhythm.     Heart sounds: Normal heart sounds.  Pulmonary:     Effort: Pulmonary effort is normal.     Breath sounds: Normal breath sounds. No wheezing.  Chest:  Breasts:     Right: No mass.     Left: No mass.    Abdominal:     General: Bowel sounds are normal.     Palpations: Abdomen is soft.     Tenderness: There is no abdominal tenderness.  Musculoskeletal:     Cervical back: Normal range of motion and neck supple.     Right knee: Swelling present. Decreased range of motion. Tenderness present.     Left knee: No swelling or effusion. Decreased range of motion.  Lymphadenopathy:     Cervical: No cervical adenopathy.  Skin:    General: Skin is warm and dry.  Neurological:     Mental Status: He is alert and oriented to person, place, and time.     Deep Tendon Reflexes: Reflexes are normal and symmetric.  Psychiatric:        Attention and Perception: Attention normal.        Mood and Affect: Mood normal.        Thought Content: Thought content normal.     Wt Readings from Last 3 Encounters:  02/15/20 188 lb (85.3 kg)  01/18/20 193 lb (87.5 kg)  11/23/19 193 lb 9.6 oz (87.8 kg)    BP 128/70   Pulse 68   Ht 6\' 2"  (1.88 m)   Wt 188  lb (85.3 kg)   SpO2 98%   BMI 24.14 kg/m   Assessment and Plan: 1. Annual physical exam Normal exam - unable to exercise due to knee pain Surgery has been postponed - he hopes to get this in the near future  2. Essential (primary) hypertension Clinically stable exam with well controlled BP. Tolerating medications without side effects at this time. Pt to continue current regimen and low sodium diet; benefits of regular exercise as able discussed. - hydrochlorothiazide (MICROZIDE) 12.5 MG capsule; Take 1 capsule (12.5 mg total) by mouth daily.  Dispense: 90 capsule; Refill: 1 - CBC with Differential/Platelet  3. Type II diabetes mellitus with complication (HCC) Clinically stable by exam and report without s/s of hypoglycemia. DM complicated by htn. Tolerating medications well without side effects or other concerns. Recent A1C 6.1 - will reduce Januvia to 50 mg per day  4. CKD stage 3 secondary to diabetes ([place]) Continue to monitor  5. Hyperlipidemia associated with type 2 diabetes mellitus ([place]) Tolerating statin medication without side effects at this time LDL is at goal of < 70 on current dose Continue same therapy without change at this time. - Lipid panel  6. Persistent atrial fibrillation (HCC) In SR today - so may be more intermittent Follow up with cardiology - continue beta blocker and DOAC  7. Acquired thrombophilia ([place]) On DOAC for Afib  8. Benign prostatic hyperplasia with weak urinary stream DRE deferred - PSA   Partially dictated using Editor, commissioning. Any errors are unintentional.  Halina Maidens, MD [facility]  02/15/2020

## 2020-02-16 DIAGNOSIS — J340 Abscess, furuncle and carbuncle of nose: Secondary | ICD-10-CM | POA: Diagnosis not present

## 2020-02-16 DIAGNOSIS — J320 Chronic maxillary sinusitis: Secondary | ICD-10-CM | POA: Diagnosis not present

## 2020-02-16 LAB — CBC WITH DIFFERENTIAL/PLATELET
Basophils Absolute: 0.1 10*3/uL (ref 0.0–0.2)
Basos: 1 %
EOS (ABSOLUTE): 0.5 10*3/uL — ABNORMAL HIGH (ref 0.0–0.4)
Eos: 8 %
Hematocrit: 36.7 % — ABNORMAL LOW (ref 37.5–51.0)
Hemoglobin: 12.6 g/dL — ABNORMAL LOW (ref 13.0–17.7)
Immature Grans (Abs): 0 10*3/uL (ref 0.0–0.1)
Immature Granulocytes: 0 %
Lymphocytes Absolute: 2.3 10*3/uL (ref 0.7–3.1)
Lymphs: 35 %
MCH: 32.1 pg (ref 26.6–33.0)
MCHC: 34.3 g/dL (ref 31.5–35.7)
MCV: 93 fL (ref 79–97)
Monocytes Absolute: 0.7 10*3/uL (ref 0.1–0.9)
Monocytes: 10 %
Neutrophils Absolute: 3.1 10*3/uL (ref 1.4–7.0)
Neutrophils: 46 %
Platelets: 202 10*3/uL (ref 150–450)
RBC: 3.93 x10E6/uL — ABNORMAL LOW (ref 4.14–5.80)
RDW: 12 % (ref 11.6–15.4)
WBC: 6.6 10*3/uL (ref 3.4–10.8)

## 2020-02-16 LAB — LIPID PANEL
Chol/HDL Ratio: 3.5 ratio (ref 0.0–5.0)
Cholesterol, Total: 123 mg/dL (ref 100–199)
HDL: 35 mg/dL — ABNORMAL LOW (ref >39)
LDL Chol Calc (NIH): 56 mg/dL (ref 0–99)
Triglycerides: 195 mg/dL — ABNORMAL HIGH (ref 0–149)
VLDL Cholesterol Cal: 32 mg/dL (ref 5–40)

## 2020-02-16 LAB — PSA: Prostate Specific Ag, Serum: 0.7 ng/mL (ref 0.0–4.0)

## 2020-02-20 DIAGNOSIS — J320 Chronic maxillary sinusitis: Secondary | ICD-10-CM | POA: Diagnosis not present

## 2020-02-23 DIAGNOSIS — J328 Other chronic sinusitis: Secondary | ICD-10-CM | POA: Diagnosis not present

## 2020-02-23 DIAGNOSIS — J342 Deviated nasal septum: Secondary | ICD-10-CM | POA: Diagnosis not present

## 2020-02-27 ENCOUNTER — Other Ambulatory Visit: Payer: Self-pay

## 2020-02-27 ENCOUNTER — Ambulatory Visit: Payer: Medicare HMO | Admitting: Dermatology

## 2020-02-27 DIAGNOSIS — L98499 Non-pressure chronic ulcer of skin of other sites with unspecified severity: Secondary | ICD-10-CM

## 2020-02-27 DIAGNOSIS — C4492 Squamous cell carcinoma of skin, unspecified: Secondary | ICD-10-CM

## 2020-02-27 DIAGNOSIS — L578 Other skin changes due to chronic exposure to nonionizing radiation: Secondary | ICD-10-CM | POA: Diagnosis not present

## 2020-02-27 DIAGNOSIS — C44229 Squamous cell carcinoma of skin of left ear and external auricular canal: Secondary | ICD-10-CM

## 2020-02-27 DIAGNOSIS — Z85828 Personal history of other malignant neoplasm of skin: Secondary | ICD-10-CM | POA: Diagnosis not present

## 2020-02-27 DIAGNOSIS — T1490XD Injury, unspecified, subsequent encounter: Secondary | ICD-10-CM | POA: Diagnosis not present

## 2020-02-27 NOTE — Progress Notes (Signed)
   Follow-Up Visit   Subjective  Maxwell Stanley is a 86 y.o. male who presents for the following: Healing wound (At site of bx proven BCC on the R mid lat calf - tx with ED&C on 01/2020, patient states that wound is not healing and he would like it checked).  The following portions of the chart were reviewed this encounter and updated as appropriate:   Tobacco  Allergies  Meds  Problems  Med Hx  Surg Hx  Fam Hx     Review of Systems:  No other skin or systemic complaints except as noted in HPI or Assessment and Plan.  Objective  Well appearing patient in no apparent distress; mood and affect are within normal limits.  A focused examination was performed including the right lower leg. Relevant physical exam findings are noted in the Assessment and Plan.  Objective  R mid lat calf: Healing ulceration 1.5 cm   Objective  L ear sup helix: Pink biopsy site.  Assessment & Plan  Healing wound /  Ulcer R mid lat calf S/P ED&C of bx proven BCC - Doesn't appear to be infected at this time, healing well, should be healed by the time he has knee surgery in April.   Continue wound care daily. Keep moist and covered.   Wound debrided today, cleaned with Puracyn, Mupirocin 2% ointment applied, and covered with Duoderm Extra thin and a Coban wrap.  Order DuoDERM through [facility].  Squamous cell carcinoma of skin - biopsy proven but untreated L ear sup helix Healing well.  Pt cancelled SCC of ear surgery due to conflict with upcoming sinus surgery and knee surgeries. Will recheck at follow up appointment in August and decide treatment plan then since patient is having sinus surgery 03/08/20 and knee replacement 04/16/20. Patient states that if his knee heals well he will be scheduled for another knee replace 6 weeks post surgery.  Actinic Damage - chronic, secondary to cumulative UV radiation exposure/sun exposure over time - diffuse scaly erythematous macules with underlying  dyspigmentation - Recommend daily broad spectrum sunscreen SPF 30+ to sun-exposed areas, reapply every 2 hours as needed.  - Call for new or changing lesions.  Return for TBSE in August - check L ear and decide tx option then.  Luther Redo, CMA, am acting as scribe for Sarina Ser, MD .  Documentation: I have reviewed the above documentation for accuracy and completeness, and I agree with the above.  Sarina Ser, MD

## 2020-02-28 ENCOUNTER — Encounter: Payer: Self-pay | Admitting: Dermatology

## 2020-02-28 DIAGNOSIS — S81801A Unspecified open wound, right lower leg, initial encounter: Secondary | ICD-10-CM | POA: Diagnosis not present

## 2020-03-01 NOTE — Discharge Instructions (Signed)
[facility] ENDOSCOPIC SINUS SURGERY [facility]  What is Functional Endoscopic Sinus Surgery?  The Surgery involves making the natural openings of the sinuses larger by removing the bony partitions that separate the sinuses from the nasal cavity.  The natural sinus lining is preserved as much as possible to allow the sinuses to resume normal function after the surgery.  In some patients nasal polyps (excessively swollen lining of the sinuses) may be removed to relieve obstruction of the sinus openings.  The surgery is performed through the nose using lighted scopes, which eliminates the need for incisions on the face.  A septoplasty is a different procedure which is sometimes performed with sinus surgery.  It involves straightening the boy partition that separates the two sides of your nose.  A crooked or deviated septum may need repair if is obstructing the sinuses or nasal airflow.  Turbinate reduction is also often performed during sinus surgery.  The turbinates are bony proturberances from the side walls of the nose which swell and can obstruct the nose in patients with sinus and allergy problems.  Their size can be surgically reduced to help relieve nasal obstruction.  What Can Sinus Surgery Do For Me?  Sinus surgery can reduce the frequency of sinus infections requiring antibiotic treatment.  This can provide improvement in nasal congestion, post-nasal drainage, facial pressure and nasal obstruction.  Surgery will NOT prevent you from ever having an infection again, so it usually only for patients who get infections 4 or more times yearly requiring antibiotics, or for infections that do not clear with antibiotics.  It will not cure nasal allergies, so patients with allergies may still require medication to treat their allergies after surgery. Surgery may improve headaches related to sinusitis, however, some people will continue to  require medication to control sinus headaches related to allergies.  Surgery will do nothing for other forms of headache (migraine, tension or cluster).  What Are the Risks of Endoscopic Sinus Surgery?  Current techniques allow surgery to be performed safely with little risk, however, there are rare complications that patients should be aware of.  Because the sinuses are located around the eyes, there is risk of eye injury, including blindness, though again, this would be quite rare. This is usually a result of bleeding behind the eye during surgery, which puts the vision oat risk, though there are treatments to protect the vision and prevent permanent disrupted by surgery causing a leak of the spinal fluid that surrounds the brain.  More serious complications would include bleeding inside the brain cavity or damage to the brain.  Again, all of these complications are uncommon, and spinal fluid leaks can be safely managed surgically if they occur.  The most common complication of sinus surgery is bleeding from the nose, which may require packing or cauterization of the nose.  Continued sinus have polyps may experience recurrence of the polyps requiring revision surgery.  Alterations of sense of smell or injury to the tear ducts are also rare complications.   What is the Surgery Like, and what is the Recovery?  The Surgery usually takes a couple of hours to perform, and is usually performed under a general anesthetic (completely asleep).  Patients are usually discharged home after a couple of hours.  Sometimes during surgery it is necessary to pack the nose to control bleeding, and the packing is left in place for 24 - 48 hours, and removed by your surgeon.    If a septoplasty was performed during the procedure, there is often a splint placed which must be removed after 5-7 days.   Discomfort: Pain is usually mild to moderate, and can be controlled by prescription pain medication or acetaminophen (Tylenol).   Aspirin, Ibuprofen (Advil, Motrin), or Naprosyn (Aleve) should be avoided, as they can cause increased bleeding.  Most patients feel sinus pressure like they have a bad head cold for several days.  Sleeping with your head elevated can help reduce swelling and facial pressure, as can ice packs over the face.  A humidifier may be helpful to keep the mucous and blood from drying in the nose.   Diet: There are no specific diet restrictions, however, you should generally start with clear liquids and a light diet of bland foods because the anesthetic can cause some nausea.  Advance your diet depending on how your stomach feels.  Taking your pain medication with food will often help reduce stomach upset which pain medications can cause.  Nasal Saline Irrigation: It is important to remove blood clots and dried mucous from the nose as it is healing.  This is done by having you irrigate the nose at least 3 - 4 times daily with a salt water solution.  We recommend using NeilMed Sinus Rinse (available at the drug store).  Fill the squeeze bottle with the solution, bend over a sink, and insert the tip of the squeeze bottle into the nose  of an inch.  Point the tip of the squeeze bottle towards the inside corner of the eye on the same side your irrigating.  Squeeze the bottle and gently irrigate the nose.  If you bend forward as you do this, most of the fluid will flow back out of the nose, instead of down your throat.   The solution should be warm, near body temperature, when you irrigate.   Each time you irrigate, you should use a full squeeze bottle.   Note that if you are instructed to use Nasal Steroid Sprays at any time after your surgery, irrigate with saline BEFORE using the steroid spray, so you do not wash it all out of the nose. Another product, Nasal Saline Gel (such as AYR Nasal Saline Gel) can be applied in each nostril 3 - 4 times daily to moisture the nose and reduce scabbing or crusting.  Bleeding:   Bloody drainage from the nose can be expected for several days, and patients are instructed to irrigate their nose frequently with salt water to help remove mucous and blood clots.  The drainage may be dark red or brown, though some fresh blood may be seen intermittently, especially after irrigation.  Do not blow you nose, as bleeding may occur. If you must sneeze, keep your mouth open to allow air to escape through your mouth.  If heavy bleeding occurs: Irrigate the nose with saline to rinse out clots, then spray the nose 3 - 4 times with Afrin Nasal Decongestant Spray.  The spray will constrict the blood vessels to slow bleeding.  Pinch the lower half of your nose shut to apply pressure, and lay down with your head elevated.  Ice packs over the nose may help as well. If bleeding persists despite these measures, you should notify your doctor.  Do not use the Afrin routinely to control nasal congestion after surgery, as it can result in worsening congestion and may affect healing.     Activity: Return to work varies among patients. Most patients will be   out of work at least 5 - 7 days to recover.  Patient may return to work after they are off of narcotic pain medication, and feeling well enough to perform the functions of their job.  Patients must avoid heavy lifting (over 10 pounds) or strenuous physical for 2 weeks after surgery, so your employer may need to assign you to light duty, or keep you out of work longer if light duty is not possible.  NOTE: you should not drive, operate dangerous machinery, do any mentally demanding tasks or make any important legal or financial decisions while on narcotic pain medication and recovering from the general anesthetic.    Call Your Doctor Immediately if You Have Any of the Following: 1. Bleeding that you cannot control with the above measures 2. Loss of vision, double vision, bulging of the eye or black eyes. 3. Fever over 101 degrees 4. Neck stiffness with  severe headache, fever, nausea and change in mental state. You are always encourage to call anytime with concerns, however, please call with requests for pain medication refills during office hours.  Office Endoscopy: During follow-up visits your doctor will remove any packing or splints that may have been placed and evaluate and clean your sinuses endoscopically.  Topical anesthetic will be used to make this as comfortable as possible, though you may want to take your pain medication prior to the visit.  How often this will need to be done varies from patient to patient.  After complete recovery from the surgery, you may need follow-up endoscopy from time to time, particularly if there is concern of recurrent infection or nasal polyps.    General Anesthesia, Adult, Care After This sheet gives you information about how to care for yourself after your procedure. Your health care provider may also give you more specific instructions. If you have problems or questions, contact your health care provider. What can I expect after the procedure? After the procedure, the following side effects are common:  Pain or discomfort at the IV site.  Nausea.  Vomiting.  Sore throat.  Trouble concentrating.  Feeling cold or chills.  Feeling weak or tired.  Sleepiness and fatigue.  Soreness and body aches. These side effects can affect parts of the body that were not involved in surgery. Follow these instructions at home: For the time period you were told by your health care provider:  Rest.  Do not participate in activities where you could fall or become injured.  Do not drive or use machinery.  Do not drink alcohol.  Do not take sleeping pills or medicines that cause drowsiness.  Do not make important decisions or sign legal documents.  Do not take care of children on your own.   Eating and drinking  Follow any instructions from your health care provider about eating or drinking  restrictions.  When you feel hungry, start by eating small amounts of foods that are soft and easy to digest (bland), such as toast. Gradually return to your regular diet.  Drink enough fluid to keep your urine pale yellow.  If you vomit, rehydrate by drinking water, juice, or clear broth. General instructions  If you have sleep apnea, surgery and certain medicines can increase your risk for breathing problems. Follow instructions from your health care provider about wearing your sleep device: ? Anytime you are sleeping, including during daytime naps. ? While taking prescription pain medicines, sleeping medicines, or medicines that make you drowsy.  Have a responsible adult stay with you  for the time you are told. It is important to have someone help care for you until you are awake and alert.  Return to your normal activities as told by your health care provider. Ask your health care provider what activities are safe for you.  Take over-the-counter and prescription medicines only as told by your health care provider.  If you smoke, do not smoke without supervision.  Keep all follow-up visits as told by your health care provider. This is important. Contact a health care provider if:  You have nausea or vomiting that does not get better with medicine.  You cannot eat or drink without vomiting.  You have pain that does not get better with medicine.  You are unable to pass urine.  You develop a skin rash.  You have a fever.  You have redness around your IV site that gets worse. Get help right away if:  You have difficulty breathing.  You have chest pain.  You have blood in your urine or stool, or you vomit blood. Summary  After the procedure, it is common to have a sore throat or nausea. It is also common to feel tired.  Have a responsible adult stay with you for the time you are told. It is important to have someone help care for you until you are awake and  alert.  When you feel hungry, start by eating small amounts of foods that are soft and easy to digest (bland), such as toast. Gradually return to your regular diet.  Drink enough fluid to keep your urine pale yellow.  Return to your normal activities as told by your health care provider. Ask your health care provider what activities are safe for you. This information is not intended to replace advice given to you by your health care provider. Make sure you discuss any questions you have with your health care provider. Document Revised: 09/08/2019 Document Reviewed: 04/07/2019 Elsevier Patient Education  2021 [employer].

## 2020-03-02 ENCOUNTER — Encounter: Payer: Self-pay | Admitting: Otolaryngology

## 2020-03-02 ENCOUNTER — Other Ambulatory Visit: Payer: Self-pay

## 2020-03-06 ENCOUNTER — Other Ambulatory Visit
Admission: RE | Admit: 2020-03-06 | Discharge: 2020-03-06 | Disposition: A | Discharge status: Home / Self Care | Payer: Medicare HMO | Source: Ambulatory Visit | Attending: Otolaryngology | Admitting: Otolaryngology

## 2020-03-06 ENCOUNTER — Other Ambulatory Visit: Payer: Self-pay

## 2020-03-06 ENCOUNTER — Encounter: Payer: Medicare HMO | Admitting: Dermatology

## 2020-03-06 DIAGNOSIS — Z20822 Contact with and (suspected) exposure to covid-19: Secondary | ICD-10-CM | POA: Insufficient documentation

## 2020-03-06 DIAGNOSIS — Z01812 Encounter for preprocedural laboratory examination: Secondary | ICD-10-CM | POA: Diagnosis not present

## 2020-03-07 LAB — SARS CORONAVIRUS 2 (TAT 6-24 HRS): SARS Coronavirus 2: NEGATIVE

## 2020-03-08 ENCOUNTER — Ambulatory Visit: Payer: Medicare HMO | Admitting: Anesthesiology

## 2020-03-08 ENCOUNTER — Ambulatory Visit
Admission: RE | Admit: 2020-03-08 | Discharge: 2020-03-08 | Disposition: A | Discharge status: Home / Self Care | Payer: Medicare HMO | Attending: Otolaryngology | Admitting: Otolaryngology

## 2020-03-08 ENCOUNTER — Other Ambulatory Visit: Payer: Self-pay

## 2020-03-08 ENCOUNTER — Encounter: Payer: Self-pay | Admitting: Otolaryngology

## 2020-03-08 ENCOUNTER — Encounter
Admission: RE | Disposition: A | Discharge status: Home / Self Care | Payer: Self-pay | Source: Home / Self Care | Attending: Otolaryngology

## 2020-03-08 DIAGNOSIS — J342 Deviated nasal septum: Secondary | ICD-10-CM | POA: Diagnosis not present

## 2020-03-08 DIAGNOSIS — Z79899 Other long term (current) drug therapy: Secondary | ICD-10-CM | POA: Insufficient documentation

## 2020-03-08 DIAGNOSIS — J320 Chronic maxillary sinusitis: Secondary | ICD-10-CM | POA: Insufficient documentation

## 2020-03-08 DIAGNOSIS — Z885 Allergy status to narcotic agent status: Secondary | ICD-10-CM | POA: Insufficient documentation

## 2020-03-08 DIAGNOSIS — Z880 Allergy status to penicillin: Secondary | ICD-10-CM | POA: Diagnosis not present

## 2020-03-08 DIAGNOSIS — J322 Chronic ethmoidal sinusitis: Secondary | ICD-10-CM | POA: Insufficient documentation

## 2020-03-08 DIAGNOSIS — Z7901 Long term (current) use of anticoagulants: Secondary | ICD-10-CM | POA: Insufficient documentation

## 2020-03-08 DIAGNOSIS — J321 Chronic frontal sinusitis: Secondary | ICD-10-CM | POA: Diagnosis not present

## 2020-03-08 DIAGNOSIS — J329 Chronic sinusitis, unspecified: Secondary | ICD-10-CM | POA: Diagnosis not present

## 2020-03-08 DIAGNOSIS — J3489 Other specified disorders of nose and nasal sinuses: Secondary | ICD-10-CM | POA: Diagnosis not present

## 2020-03-08 HISTORY — PX: FRONTAL SINUS EXPLORATION: SHX6591

## 2020-03-08 HISTORY — PX: ETHMOIDECTOMY: SHX5197

## 2020-03-08 HISTORY — PX: IMAGE GUIDED SINUS SURGERY: SHX6570

## 2020-03-08 HISTORY — PX: SEPTOPLASTY: SHX2393

## 2020-03-08 HISTORY — PX: MAXILLARY ANTROSTOMY: SHX2003

## 2020-03-08 LAB — GLUCOSE, CAPILLARY
Glucose-Capillary: 131 mg/dL — ABNORMAL HIGH (ref 70–99)
Glucose-Capillary: 167 mg/dL — ABNORMAL HIGH (ref 70–99)

## 2020-03-08 SURGERY — SINUS SURGERY, WITH IMAGING GUIDANCE
Anesthesia: General | Site: Nose

## 2020-03-08 MED ORDER — PHENYLEPHRINE HCL (PRESSORS) 10 MG/ML IV SOLN
INTRAVENOUS | Status: DC | PRN
  Administered 2020-03-08: 100 ug via INTRAVENOUS
  Administered 2020-03-08: 50 ug via INTRAVENOUS
  Administered 2020-03-08: 100 ug via INTRAVENOUS
  Administered 2020-03-08 (×2): 50 ug via INTRAVENOUS
  Administered 2020-03-08 (×2): 100 ug via INTRAVENOUS
  Administered 2020-03-08 (×2): 50 ug via INTRAVENOUS

## 2020-03-08 MED ORDER — GLYCOPYRROLATE 0.2 MG/ML IJ SOLN
INTRAMUSCULAR | Status: DC | PRN
  Administered 2020-03-08: .1 mg via INTRAVENOUS

## 2020-03-08 MED ORDER — ONDANSETRON HCL 4 MG/2ML IJ SOLN
INTRAMUSCULAR | Status: DC | PRN
  Administered 2020-03-08: 4 mg via INTRAVENOUS

## 2020-03-08 MED ORDER — FENTANYL CITRATE (PF) 100 MCG/2ML IJ SOLN
INTRAMUSCULAR | Status: DC | PRN
  Administered 2020-03-08: 50 ug via INTRAVENOUS

## 2020-03-08 MED ORDER — CLINDAMYCIN HCL 300 MG PO CAPS: 300.0000 mg | ORAL_CAPSULE | Freq: Three times a day (TID) | ORAL | 0 refills | Status: AC

## 2020-03-08 MED ORDER — LIDOCAINE HCL (CARDIAC) PF 100 MG/5ML IV SOSY
PREFILLED_SYRINGE | INTRAVENOUS | Status: DC | PRN
  Administered 2020-03-08: 40 mg via INTRAVENOUS

## 2020-03-08 MED ORDER — TRAMADOL HCL 50 MG PO TABS: ORAL_TABLET | ORAL | 0 refills | Status: DC

## 2020-03-08 MED ORDER — PHENYLEPHRINE HCL 0.5 % NA SOLN
NASAL | Status: DC | PRN
  Administered 2020-03-08: 15 mL via TOPICAL

## 2020-03-08 MED ORDER — MIDAZOLAM HCL 5 MG/5ML IJ SOLN
INTRAMUSCULAR | Status: DC | PRN
  Administered 2020-03-08: 1 mg via INTRAVENOUS

## 2020-03-08 MED ORDER — SUCCINYLCHOLINE CHLORIDE 20 MG/ML IJ SOLN
INTRAMUSCULAR | Status: DC | PRN
  Administered 2020-03-08: 100 mg via INTRAVENOUS

## 2020-03-08 MED ORDER — LIDOCAINE-EPINEPHRINE 1 %-1:100000 IJ SOLN
INTRAMUSCULAR | Status: DC | PRN
  Administered 2020-03-08: 2 mL
  Administered 2020-03-08: 7 mL

## 2020-03-08 MED ORDER — PROPOFOL 10 MG/ML IV BOLUS
INTRAVENOUS | Status: DC | PRN
  Administered 2020-03-08: 30 mg via INTRAVENOUS
  Administered 2020-03-08: 100 mg via INTRAVENOUS

## 2020-03-08 MED ORDER — DEXAMETHASONE SODIUM PHOSPHATE 4 MG/ML IJ SOLN
INTRAMUSCULAR | Status: DC | PRN
  Administered 2020-03-08: 8 mg via INTRAVENOUS

## 2020-03-08 MED ORDER — ACETAMINOPHEN 10 MG/ML IV SOLN
1000.0000 mg | Freq: Once | INTRAVENOUS | Status: AC
  Administered 2020-03-08: 1000 mg via INTRAVENOUS

## 2020-03-08 MED ORDER — SODIUM CHLORIDE FLUSH 0.9 % IV SOLN
INTRAVENOUS | Status: AC
  Filled 2020-03-08: qty 10

## 2020-03-08 MED ORDER — OXYMETAZOLINE HCL 0.05 % NA SOLN
2.0000 | Freq: Once | NASAL | Status: AC
  Administered 2020-03-08: 2 via NASAL

## 2020-03-08 MED ORDER — LACTATED RINGERS IV SOLN: INTRAVENOUS | Status: DC

## 2020-03-08 MED ORDER — CEFAZOLIN SODIUM-DEXTROSE 2-4 GM/100ML-% IV SOLN
2.0000 g | Freq: Once | INTRAVENOUS | Status: AC
  Administered 2020-03-08: 2 g via INTRAVENOUS

## 2020-03-08 MED ORDER — EPHEDRINE SULFATE 50 MG/ML IJ SOLN
INTRAMUSCULAR | Status: DC | PRN
  Administered 2020-03-08: 10 mg via INTRAVENOUS
  Administered 2020-03-08: 5 mg via INTRAVENOUS
  Administered 2020-03-08: 10 mg via INTRAVENOUS
  Administered 2020-03-08 (×2): 5 mg via INTRAVENOUS
  Administered 2020-03-08: 10 mg via INTRAVENOUS

## 2020-03-08 MED ORDER — PREDNISONE 10 MG PO TABS: ORAL_TABLET | ORAL | 0 refills | Status: DC

## 2020-03-08 SURGICAL SUPPLY — 40 items
BALLN SINUPLASTY KIT 6X16 (BALLOONS) ×3
BALLOON SINUPLASTY KIT 6X16 (BALLOONS) ×2 IMPLANT
BATTERY INSTRU NAVIGATION (MISCELLANEOUS) ×9 IMPLANT
BTRY SRG DRVR LF (MISCELLANEOUS) ×6
CANISTER SUCT 1200ML W/VALVE (MISCELLANEOUS) ×3 IMPLANT
CATH IV 18X1 1/4 SAFELET (CATHETERS) ×3 IMPLANT
COAGULATOR SUCT 8FR VV (MISCELLANEOUS) ×3 IMPLANT
DEVICE INFLATION SEID (MISCELLANEOUS) ×3 IMPLANT
ELECT REM PT RETURN 9FT ADLT (ELECTROSURGICAL) ×3
ELECTRODE REM PT RTRN 9FT ADLT (ELECTROSURGICAL) ×2 IMPLANT
GLOVE PI ULTRA LF STRL 7.5 (GLOVE) ×4 IMPLANT
GLOVE PI ULTRA NON LATEX 7.5 (GLOVE) ×2
GOWN STRL REUS W/ TWL LRG LVL3 (GOWN DISPOSABLE) ×2 IMPLANT
GOWN STRL REUS W/TWL LRG LVL3 (GOWN DISPOSABLE) ×3
IV CATH 18X1 1/4 SAFELET (CATHETERS) ×2
IV NS 500ML (IV SOLUTION) ×3
IV NS 500ML BAXH (IV SOLUTION) ×2 IMPLANT
KIT TURNOVER KIT A (KITS) ×3 IMPLANT
NEEDLE ANESTHESIA  27G X 3.5 (NEEDLE) ×1
NEEDLE ANESTHESIA 27G X 3.5 (NEEDLE) ×2 IMPLANT
NEEDLE HYPO 27GX1-1/4 (NEEDLE) ×3 IMPLANT
NS IRRIG 500ML POUR BTL (IV SOLUTION) ×3 IMPLANT
PACK ENT CUSTOM (PACKS) ×3 IMPLANT
PACKING NASAL EPIS 4X2.4 XEROG (MISCELLANEOUS) ×6 IMPLANT
PATTIES SURGICAL .5 X3 (DISPOSABLE) ×3 IMPLANT
SHAVER DIEGO BLD STD TYPE A (BLADE) ×3 IMPLANT
SOL ANTI-FOG 6CC FOG-OUT (MISCELLANEOUS) ×2 IMPLANT
SOL FOG-OUT ANTI-FOG 6CC (MISCELLANEOUS) ×1
SPLINT NASAL SEPTAL BLV .50 ST (MISCELLANEOUS) ×3 IMPLANT
STRAP BODY AND KNEE 60X3 (MISCELLANEOUS) ×3 IMPLANT
SUT CHROMIC 3-0 (SUTURE) ×3
SUT CHROMIC 3-0 KS 27XMFL CR (SUTURE) ×2
SUT ETHILON 3-0 KS 30 BLK (SUTURE) ×3 IMPLANT
SUT PLAIN GUT 4-0 (SUTURE) ×3 IMPLANT
SUTURE CHRMC 3-0 KS 27XMFL CR (SUTURE) ×2 IMPLANT
SYR 3ML LL SCALE MARK (SYRINGE) ×3 IMPLANT
TOWEL OR 17X26 4PK STRL BLUE (TOWEL DISPOSABLE) ×3 IMPLANT
TRACKER CRANIALMASK (MASK) ×3 IMPLANT
TUBING DECLOG MULTIDEBRIDER (TUBING) ×3 IMPLANT
WATER STERILE IRR 250ML POUR (IV SOLUTION) ×3 IMPLANT

## 2020-03-08 NOTE — Anesthesia Preprocedure Evaluation (Addendum)
Anesthesia Evaluation  Patient identified by MRN, date of birth, ID band Patient awake    Reviewed: Allergy & Precautions, NPO status , Patient's Chart, lab work & pertinent test results  Airway Mallampati: II  TM Distance: >3 FB     Dental   Pulmonary    breath sounds clear to auscultation       Cardiovascular hypertension, + dysrhythmias Atrial Fibrillation  Rhythm:Regular Rate:Normal  HLD   Neuro/Psych    GI/Hepatic GERD  ,  Endo/Other  diabetes, Type 2  Renal/GU Renal disease (CKD3)     Musculoskeletal  (+) Arthritis ,   Abdominal   Peds  Hematology   Anesthesia Other Findings   Reproductive/Obstetrics                            Anesthesia Physical Anesthesia Plan  ASA: III  Anesthesia Plan: General   Post-op Pain Management:    Induction: Intravenous  PONV Risk Score and Plan: Ondansetron, Dexamethasone, Midazolam and Treatment may vary due to age or medical condition  Airway Management Planned: Oral ETT  Additional Equipment:   Intra-op Plan:   Post-operative Plan:   Informed Consent: I have reviewed the patients History and Physical, chart, labs and discussed the procedure including the risks, benefits and alternatives for the proposed anesthesia with the patient or authorized representative who has indicated his/her understanding and acceptance.     Dental advisory given  Plan Discussed with: CRNA  Anesthesia Plan Comments:         Anesthesia Quick Evaluation

## 2020-03-08 NOTE — Op Note (Signed)
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  The left upper nasal airway was very narrow and this had to be corrected prior to sinus surgery to provide access to the sinuses.  The left middle meatus was completely filled and the medial wall of the maxillary sinus was protruding into the middle meatus.  There is a fungus ball and white-yellow pus filling the left maxillary sinus.  The ethmoids and frontal sinuses had extremely thickened mucous membranes.  Procedure: With the patient in a comfortable supine position,  general orotracheal anesthesia was induced without difficulty.     The patient received preoperative Afrin spray for topical decongestion and vasoconstriction.  Intravenous prophylactic antibiotics were administered.  At an appropriate level, the patient was placed in a semi-sitting position.  Nasal vibrissae were trimmed.   1% Xylocaine with 1:100,000 epinephrine, 6 cc's, was infiltrated into the anterior floor of the nose, into the nasal spine region, into the membranous columella, and finally into the submucoperichondrial plane of the septum on both sides.  Several minutes were allowed for this to take effect.  Cottoniod pledgetts soaked in Afrin and 4% Xylocaine were placed into both nasal cavities and left while the patient was prepped and draped in the standard fashion.  The materials were removed from the nose and observed to be intact and correct in number.  The nose was  inspected with a headlight and zero degree scope with the findings as described above.  The image guided system was brought in and the CT scan was downloaded to the disc.  The template was applied to the face and this was registered to the system as well.  There was point 8 mm of variance.  The suction instruments were registered to the system and there is seem to be good alignment.   A left Killian incision was sharply executed and carried down to the quadrangular cartilage. The mucoperichondrium was elelvated along the quadrangular plate back to the bony-cartilaginous junction. The mucoperiostium was then elevated along the ethmoid plate and the vomer. The boney-catilaginous junction was then split with a freer elevator and the mucoperiosteum was elevated on the opposite side. The mucoperiosteum was then elevated along the maxillary crest as needed to expose the crooked bone of the crest.  Boney spurs of the vomer and maxillary crest were removed with Donavan Foil forceps.  The cartilaginous plate was trimmed along its posterior and inferior borders of about 2 mm of cartilage to free it up inferiorly. Some of the deviated ethmoid plate was then fractured and removed with Takahashi forceps to free up the posterior border of the quadrangular plate and allow it to swing back to the midline. The mucosal flaps were placed back into their anatomic position to allow visualization of the airways. The septum now sat in the midline with an improved airway.  A 3-0 Chromic suture on a Keith needle in used to anchor the inferior septum at the nasal spine with a through and through suture. The mucosal flaps  are then sutured together using a through and through whip stitch of 4-0 Plain Gut with a mini-Keith needle. This was used to close the Greene incision as well.   The 0 degree scope was used to visualize the left nasal airway.  1% Xylocaine with epi 1: 100,000 was used for infiltration of the uncinate process and the  anterior and posterior roots of the middle turbinate.  The uncinate process was pushed out into the airway and middle meatus and behind it was pus and fungus ball.  The uncinate was then trimmed off completely and much of the fungus ball and pus was then suction clear of the middle meatus.  The side biter was used to help remove all of the uncinate process and widen the opening to the maxillary antrum.  This was opened more posteriorly and inferiorly as well to provide better drainage here.  The maxillary sinus was suctioned of his pus and had the sinus flushed to make sure that all of debris was removed.  There was a good opening now into the maxillary sinus that could be easily seen with the 30 and 70 degree scopes with no evidence of disease left.  The 0 degree scope was then used for opening up the posterior middle ethmoid air cells.  The image guided system was used to evaluate the depth of dissection to make sure all the sinuses were open.  Sinuses had thickened mucous membranes throughout.  The 30 degree scope was then used to visualize the middle and anterior ethmoid air cells.  These were cleaned of debris as well using the image guided system to make sure that all the sinuses were open.  There is a large agar nasi cell that was cleaned out and the lateral wall was cleared to make it in connection with the anterior ethmoid air cell.  The frontal sinus instruments were used to help remove some of the bony tissue that was near the opening the frontal sinus duct.  This was medial to the agar nasi cell.  The tissue was very swollen here and it was hard to see so a Clarence balloon system was used to find the opening the frontal sinus duct and showed light up into the forehead inside the frontal sinus.  The balloon was then dilated and this widened the frontal sinus opening and deflated the swollen mucosal tissue so that the frontal sinus duct could be easily seen and had been widely open.  All the sinuses  were revisited with the 0, 30, and 70 degree scopes.  The sinuses were all clear and all of the air cells were opened using the image guided system to make sure that no hidden pockets were left.  Xerogel was then placed at the opening to the left frontal sinus as well as into the left posterior ethmoid area.  Further xerogel was placed between the middle turbinate and lateral wall the nose to prevent lateralization here.  The nasal airway was clear.  The airways were then visualized and showed open passageways on both sides that were significantly improved compared to before surgery. There was no signifcant bleeding.  The cartilage at the anterior from the nose was very stable and nasal splints were not placed on either side.  The patient tolerated the procedure well.  There were no operative complications.  The patient was turned back over to anesthesia, and awakened, extubated, and taken to the PACU in satisfactory condition.  Dispo:   PACU to home  Plan:  Ice, elevation, narcotic analgesia, steroid taper, and prophylactic antibiotics for the duration of indwelling nasal foreign bodies.  We will reevaluate the patient in the office in 6 days and remove the septal splints.  Return to work in 10 days, strenuous activities in two weeks.   Elon Alas Juengel 03/08/2020 11:27 AM

## 2020-03-08 NOTE — Anesthesia Procedure Notes (Signed)
Procedure Name: Intubation Date/Time: 03/08/2020 9:39 AM Performed by: Mayme Genta, CRNA Pre-anesthesia Checklist: Patient identified, Emergency Drugs available, Suction available, Patient being monitored and Timeout performed Patient Re-evaluated:Patient Re-evaluated prior to induction Oxygen Delivery Method: Circle system utilized Preoxygenation: Pre-oxygenation with 100% oxygen Induction Type: IV induction Ventilation: Mask ventilation without difficulty Laryngoscope Size: Miller and 3 Grade View: Grade I Tube type: Oral Rae Tube size: 7.5 mm Number of attempts: 1 Placement Confirmation: ETT inserted through vocal cords under direct vision,  positive ETCO2 and breath sounds checked- equal and bilateral Tube secured with: Tape Dental Injury: Teeth and Oropharynx as per pre-operative assessment

## 2020-03-08 NOTE — Anesthesia Postprocedure Evaluation (Signed)
Anesthesia Post Note  Patient: Maxwell Stanley  Procedure(s) Performed: IMAGE GUIDED SINUS SURGERY (N/A Nose) MAXILLARY ANTROSTOMY WITH TISSUE REMOVAL (Left Nose) TOTAL ETHMOIDECTOMY (Left Nose) SEPTOPLASTY (N/A Nose) FRONTAL SINUS EXPLORATION (Left Nose)     Patient location during evaluation: PACU Anesthesia Type: General Level of consciousness: awake Pain management: pain level controlled Vital Signs Assessment: post-procedure vital signs reviewed and stable Respiratory status: respiratory function stable Cardiovascular status: stable Postop Assessment: no signs of nausea or vomiting Anesthetic complications: no   No complications documented.  Veda Canning

## 2020-03-08 NOTE — Transfer of Care (Signed)
Immediate Anesthesia Transfer of Care Note  Patient: Maxwell Stanley  Procedure(s) Performed: IMAGE GUIDED SINUS SURGERY (N/A Nose) MAXILLARY ANTROSTOMY WITH TISSUE REMOVAL (Left Nose) TOTAL ETHMOIDECTOMY (Left Nose) SEPTOPLASTY (N/A Nose) FRONTAL SINUS EXPLORATION (Left Nose)  Patient Location: PACU  Anesthesia Type: General  Level of Consciousness: awake, alert  and patient cooperative  Airway and Oxygen Therapy: Patient Spontanous Breathing and Patient connected to supplemental oxygen  Post-op Assessment: Post-op Vital signs reviewed, Patient's Cardiovascular Status Stable, Respiratory Function Stable, Patent Airway and No signs of Nausea or vomiting  Post-op Vital Signs: Reviewed and stable  Complications: No complications documented.

## 2020-03-08 NOTE — H&P (Signed)
H&P has been reviewed and patient reevaluated, no changes necessary. To be downloaded later.  

## 2020-03-09 ENCOUNTER — Encounter: Payer: Self-pay | Admitting: Otolaryngology

## 2020-03-12 LAB — SURGICAL PATHOLOGY

## 2020-03-13 ENCOUNTER — Ambulatory Visit: Payer: Medicare HMO | Admitting: Dermatology

## 2020-03-13 DIAGNOSIS — Z48813 Encounter for surgical aftercare following surgery on the respiratory system: Secondary | ICD-10-CM | POA: Diagnosis not present

## 2020-03-19 ENCOUNTER — Other Ambulatory Visit: Payer: Self-pay | Admitting: Internal Medicine

## 2020-03-19 DIAGNOSIS — E118 Type 2 diabetes mellitus with unspecified complications: Secondary | ICD-10-CM

## 2020-03-19 DIAGNOSIS — N401 Enlarged prostate with lower urinary tract symptoms: Secondary | ICD-10-CM

## 2020-03-19 MED ORDER — ONETOUCH ULTRA BLUE VI STRP: ORAL_STRIP | 3 refills | Status: DC

## 2020-03-19 MED ORDER — TAMSULOSIN HCL 0.4 MG PO CAPS: ORAL_CAPSULE | ORAL | 0 refills | Status: DC

## 2020-03-19 NOTE — Telephone Encounter (Signed)
Medication Refill - Medication: tamsulosin (FLOMAX) 0.4 MG CAPS capsule glucose blood (ONE TOUCH ULTRA TEST) test strip     Preferred Pharmacy (with phone number or street name):  [facility] [place], [place] to [facility] [employer] [facility] Phone:  [phone]  Fax:  [phone]       Agent: Please be advised that RX refills may take up to 3 business days. We ask that you follow-up with your pharmacy.

## 2020-03-22 DIAGNOSIS — Z48813 Encounter for surgical aftercare following surgery on the respiratory system: Secondary | ICD-10-CM | POA: Diagnosis not present

## 2020-03-26 ENCOUNTER — Telehealth: Payer: Self-pay | Admitting: Pharmacist

## 2020-03-26 NOTE — Chronic Care Management (AMB) (Signed)
Chronic Care Management Pharmacy Assistant   Name: Maxwell Stanley  MRN: [id number] DOB: [birth year]  Reason for Encounter: Disease State/Diabetes Adherence Call  Recent office visits:  02/15/2020 OV PCP Dr. [name]; reduce Januvia to 50 mg per day; A1C 6.1  Recent consult visits:  02/27/2020 OV (dermatology) Dr. Nehemiah Massed; no medication changes.  Hospital visits:  Medication Reconciliation was completed by comparing discharge summary, patient's EMR and Pharmacy list, and upon discussion with patient.  Admitted to the hospital on 03/08/2020 due to procedure/surgery. Discharge date was 03/08/2020. Discharged from [facility].    Medications that remain the same after Hospital Discharge:??  -All other medications will remain the same.    Medications: Outpatient Encounter Medications as of 03/26/2020  Medication Sig  . acetaminophen (TYLENOL) 500 MG tablet Take 1,000 mg by mouth daily.  [name] amLODipine (NORVASC) 2.5 MG tablet Take 1 tablet (2.5 mg total) by mouth daily.  [name] atenolol (TENORMIN) 50 MG tablet TAKE 1 TABLET AT BEDTIME (Patient taking differently: Take 50 mg by mouth at bedtime.)  . celecoxib (CELEBREX) 200 MG capsule Take 200 mg by mouth daily. Dr. Marry Guan  . cholecalciferol (VITAMIN D) 1000 UNITS tablet Take 1,000 Units by mouth at bedtime.   . clindamycin (CLEOCIN) 300 MG capsule Take 300 mg by mouth 3 (three) times daily. (Patient not taking: Reported on 03/02/2020)  . clotrimazole (LOTRIMIN) 1 % cream Apply 1 application topically 2 (two) times daily.  [name] docusate sodium (COLACE) 100 MG capsule Take 200 mg by mouth daily.   [name] ELIQUIS 5 MG TABS tablet Take 1 tablet (5 mg total) by mouth 2 (two) times daily.  . finasteride (PROSCAR) 5 MG tablet TAKE 1 TABLET DAILY (Patient taking differently: Take 5 mg by mouth daily.)  . fluticasone (FLONASE) 50 MCG/ACT nasal spray USE 2 SPRAYS IN EACH       NOSTRIL DAILY (Patient taking differently: Place 2 sprays into both  nostrils daily as needed for allergies.)  . glucose blood (ONE TOUCH ULTRA TEST) test strip Use to test blood sugar once daily.  . hydrochlorothiazide (MICROZIDE) 12.5 MG capsule Take 1 capsule (12.5 mg total) by mouth daily.  [name] losartan (COZAAR) 100 MG tablet Take 0.5 tablets (50 mg total) by mouth 2 (two) times daily.  . metFORMIN (GLUCOPHAGE-XR) 500 MG 24 hr tablet Take 3 tablets (1,500 mg total) by mouth at bedtime.  . Misc Natural Products (OSTEO BI-FLEX TRIPLE STRENGTH) TABS Take 1 tablet by mouth 2 (two) times daily.   . Multiple Vitamin (MULTIVITAMIN WITH MINERALS) TABS tablet Take 0.5 tablets by mouth in the morning and at bedtime.  . mupirocin ointment (BACTROBAN) 2 % Apply 1 application topically daily. (Patient taking differently: Apply 1 application topically daily as needed (wound care.).)  . OneTouch Delica Lancets 50N MISC 1 each by Does not apply route daily. Use to test blood sugar once daily.  . pantoprazole (PROTONIX) 40 MG tablet TAKE 1 TABLET DAILY (Patient taking differently: Take 40 mg by mouth daily.)  . predniSONE (DELTASONE) 10 MG tablet Start with 3 pills tomorrow. Taper over the next 6 days.  3,3,2,2,1,1.  . Probiotic Product (PROBIOTIC DAILY PO) Take 1 tablet by mouth daily.  . rosuvastatin (CRESTOR) 20 MG tablet Take 1 tablet (20 mg total) by mouth at bedtime.  . sitaGLIPtin (JANUVIA) 100 MG tablet Take 1 tablet (100 mg total) by mouth daily.  . tamsulosin (FLOMAX) 0.4 MG CAPS capsule TAKE 1 CAPSULE DAILY AFTER BREAKFAST  . traMADol (  ULTRAM) 50 MG tablet 1-2 tabs every 6 hours as needed for severe pain.   No facility-administered encounter medications on file as of 03/26/2020.   Recent Relevant Labs: Lab Results  Component Value Date/Time   HGBA1C 6.1 (H) 01/18/2020 07:42 AM   HGBA1C 7.7 (H) 09/29/2019 08:49 AM    Kidney Function Lab Results  Component Value Date/Time   CREATININE 1.37 (H) 01/18/2020 07:42 AM   CREATININE 1.38 (H) 05/19/2019 08:26 AM    GFRNONAA 51 (L) 01/18/2020 07:42 AM   GFRAA 55 (L) 05/19/2019 08:26 AM    . Current antihyperglycemic regimen:  o Metformin 500 mg; 3 tablets at bedtime o Januvia 100 mg daily  . What recent interventions/DTPs have been made to improve glycemic control:  o Patient states he reduced his Januvia to 50 mg per day as instructed by Dr. [name]. He states after two weeks he started back taking 100 mg per day due to elevated sugar readings.  . Have there been any recent hospitalizations or ED visits since last visit with CPP? Yes , patient had an admission for sinus surgery on 03/08/2020  . Patient denies hypoglycemic symptoms, including Pale, Sweaty, Shaky, Hungry and Nervous/irritable   . Patient denies hyperglycemic symptoms, including excessive thirst, fatigue, polyuria and weakness   . How often are you checking your blood sugar? once daily   . What are your blood sugars ranging?  o Fasting: 130-140 o Before meals: n/a o After meals: n/a o Bedtime: n/a  . During the week, how often does your blood glucose drop below 70? Never   . Are you checking your feet daily/regularly? Yes, patient states he checks his feet everyday,  Adherence Review: Is the patient currently on a STATIN medication? Yes Is the patient currently on ACE/ARB medication? Yes Does the patient have >5 day gap between last estimated fill dates? No   Patient was asked about his out of pocket expenses for a patient assistance program. Patient states he is not interested in applying for any patient assistance at this time.  Future Appointments  Date Time Provider [place]  06/06/2020  8:20 AM Glean Hess, MD [facility]  08/27/2020  2:30 PM Ralene Bathe, MD [facility] None  11/26/2020  2:40 PM [place] ADVISOR [facility]  02/19/2021  9:20 AM Glean Hess, MD [facility]     Patient scheduled a follow up telephone appointment with Birdena Crandall, CPP for 05/15/2020 at 11:00 am.  Star  Rating Drugs: Losartan Potassium 100 mg 02/22/2020 90 DS Rosuvastatin 20 mg 01/26/2020 90 DS Januvia 100 mg 01/26/2020 90 DS Metformin 500 mg 01/26/2020 90 DS  April D Calhoun, [place] Pharmacist Assistant [phone]

## 2020-03-26 NOTE — Chronic Care Management (AMB) (Signed)
    Chronic Care Management Pharmacy Assistant   Name: Maxwell Stanley  MRN: [id number] DOB: 09/14/1934  Reason for Encounter: Pharmacy Call  Medications: Outpatient Encounter Medications as of 03/26/2020  Medication Sig  . acetaminophen (TYLENOL) 500 MG tablet Take 1,000 mg by mouth daily.  [name] amLODipine (NORVASC) 2.5 MG tablet Take 1 tablet (2.5 mg total) by mouth daily.  [name] atenolol (TENORMIN) 50 MG tablet TAKE 1 TABLET AT BEDTIME (Patient taking differently: Take 50 mg by mouth at bedtime.)  . celecoxib (CELEBREX) 200 MG capsule Take 200 mg by mouth daily. Dr. Marry Guan  . cholecalciferol (VITAMIN D) 1000 UNITS tablet Take 1,000 Units by mouth at bedtime.   . clindamycin (CLEOCIN) 300 MG capsule Take 300 mg by mouth 3 (three) times daily. (Patient not taking: Reported on 03/02/2020)  . clotrimazole (LOTRIMIN) 1 % cream Apply 1 application topically 2 (two) times daily.  [name] docusate sodium (COLACE) 100 MG capsule Take 200 mg by mouth daily.   [name] ELIQUIS 5 MG TABS tablet Take 1 tablet (5 mg total) by mouth 2 (two) times daily.  . finasteride (PROSCAR) 5 MG tablet TAKE 1 TABLET DAILY (Patient taking differently: Take 5 mg by mouth daily.)  . fluticasone (FLONASE) 50 MCG/ACT nasal spray USE 2 SPRAYS IN EACH       NOSTRIL DAILY (Patient taking differently: Place 2 sprays into both nostrils daily as needed for allergies.)  . glucose blood (ONE TOUCH ULTRA TEST) test strip Use to test blood sugar once daily.  . hydrochlorothiazide (MICROZIDE) 12.5 MG capsule Take 1 capsule (12.5 mg total) by mouth daily.  [name] losartan (COZAAR) 100 MG tablet Take 0.5 tablets (50 mg total) by mouth 2 (two) times daily.  . metFORMIN (GLUCOPHAGE-XR) 500 MG 24 hr tablet Take 3 tablets (1,500 mg total) by mouth at bedtime.  . Misc Natural Products (OSTEO BI-FLEX TRIPLE STRENGTH) TABS Take 1 tablet by mouth 2 (two) times daily.   . Multiple Vitamin (MULTIVITAMIN WITH MINERALS) TABS tablet Take 0.5 tablets by mouth in the  morning and at bedtime.  . mupirocin ointment (BACTROBAN) 2 % Apply 1 application topically daily. (Patient taking differently: Apply 1 application topically daily as needed (wound care.).)  . OneTouch Delica Lancets 16S MISC 1 each by Does not apply route daily. Use to test blood sugar once daily.  . pantoprazole (PROTONIX) 40 MG tablet TAKE 1 TABLET DAILY (Patient taking differently: Take 40 mg by mouth daily.)  . predniSONE (DELTASONE) 10 MG tablet Start with 3 pills tomorrow. Taper over the next 6 days.  3,3,2,2,1,1.  . Probiotic Product (PROBIOTIC DAILY PO) Take 1 tablet by mouth daily.  . rosuvastatin (CRESTOR) 20 MG tablet Take 1 tablet (20 mg total) by mouth at bedtime.  . sitaGLIPtin (JANUVIA) 100 MG tablet Take 1 tablet (100 mg total) by mouth daily.  . tamsulosin (FLOMAX) 0.4 MG CAPS capsule TAKE 1 CAPSULE DAILY AFTER BREAKFAST  . traMADol (ULTRAM) 50 MG tablet 1-2 tabs every 6 hours as needed for severe pain.   No facility-administered encounter medications on file as of 03/26/2020.     I contacted [facility] for the patient's out of pocket expenses for the year 2022 faxed to Birdena Crandall, [place] at [facility] [phone].  This information is to complete patient assistance application for medication Eliquis.  April D Calhoun, [place] Pharmacist Assistant [phone]

## 2020-03-27 ENCOUNTER — Other Ambulatory Visit: Payer: Self-pay | Admitting: Internal Medicine

## 2020-03-27 DIAGNOSIS — K2100 Gastro-esophageal reflux disease with esophagitis, without bleeding: Secondary | ICD-10-CM

## 2020-03-27 MED ORDER — PANTOPRAZOLE SODIUM 40 MG PO TBEC: 40.0000 mg | DELAYED_RELEASE_TABLET | Freq: Every day | ORAL | 0 refills | Status: DC

## 2020-03-27 NOTE — Telephone Encounter (Signed)
Medication: pantoprazole (PROTONIX) 40 MG tablet  Has the pt contacted their pharmacy? Yes, he said [facility] told him to call because they got no response  Preferred pharmacy: [place], [place] to [facility]  Please be advised refills may take up to 3 business days.  We ask that you follow up with your pharmacy.

## 2020-04-01 NOTE — Discharge Instructions (Signed)
Instructions after Total Knee Replacement   Maxwell Stanley P. Antion Andres, Jr., M.D.     Dept. of Orthopaedics & Sports Medicine  [facility]  [address]  [place], [place]  [address]  Phone: [phone]   Fax: [phone]    DIET: Drink plenty of non-alcoholic fluids. Resume your normal diet. Include foods high in fiber.  ACTIVITY:  You may use crutches or a walker with weight-bearing as tolerated, unless instructed otherwise. You may be weaned off of the walker or crutches by your Physical Therapist.  Do NOT place pillows under the knee. Anything placed under the knee could limit your ability to straighten the knee.   Continue doing gentle exercises. Exercising will reduce the pain and swelling, increase motion, and prevent muscle weakness.   Please continue to use the TED compression stockings for 6 weeks. You may remove the stockings at night, but should reapply them in the morning. Do not drive or operate any equipment until instructed.  WOUND CARE:  Continue to use the PolarCare or ice packs periodically to reduce pain and swelling. You may bathe or shower after the staples are removed at the first office visit following surgery.  MEDICATIONS: You may resume your regular medications. Please take the pain medication as prescribed on the medication. Do not take pain medication on an empty stomach. You have been given a prescription for a blood thinner (Lovenox or Coumadin). Please take the medication as instructed. (NOTE: After completing a 2 week course of Lovenox, take one Enteric-coated aspirin once a day. This along with elevation will help reduce the possibility of phlebitis in your operated leg.) Do not drive or drink alcoholic beverages when taking pain medications.  CALL THE OFFICE FOR: Temperature above 101 degrees Excessive bleeding or drainage on the dressing. Excessive swelling, coldness, or paleness of the toes. Persistent nausea and vomiting.  FOLLOW-UP:  You  should have an appointment to return to the office in 10-14 days after surgery. Arrangements have been made for continuation of Physical Therapy (either home therapy or outpatient therapy).   [facility] Department Directory         [email or web address]       [email or web address]          Cardiology  Appointments: [place] - [phone] [place] - [phone]  Endocrinology  Appointments: [place] - [phone] [place] - [phone]  Gastroenterology  Appointments: [place] - [phone] [place] - [phone]        General Surgery   Appointments: [place] - [phone]  Internal Medicine/Family Medicine  Appointments: [place] - [phone] [place] - [phone] [place] - [phone]  Metabolic and Weigh Loss Surgery  Appointments: [place] - [phone]        Neurology  Appointments: [place] - [phone] [place] - [phone]  Neurosurgery  Appointments: [place] - [phone]  Obstetrics & Gynecology  Appointments: [place] - [phone] [place] - [phone]        Pediatrics  Appointments: [place] - [phone] [place] - [phone]  Physiatry  Appointments: [place] -[phone]  Physical Therapy  Appointments: [place] - [phone] [place] - [phone]        Podiatry  Appointments: [place] - [phone] [place] - [phone]  Pulmonology  Appointments: [place] - [phone]  Rheumatology  Appointments: [place] - [phone]        [place] Location: [facility]  [address] , [place]  [address]  [place] Location: [facility] [address] [place], [place]  [address]  [place] Location: [facility] [address] [place],   [address]    

## 2020-04-02 ENCOUNTER — Other Ambulatory Visit: Payer: Self-pay | Admitting: Internal Medicine

## 2020-04-02 MED ORDER — FINASTERIDE 5 MG PO TABS: 5.0000 mg | ORAL_TABLET | Freq: Every day | ORAL | 0 refills | Status: DC

## 2020-04-02 NOTE — Telephone Encounter (Signed)
Medication Refill - Medication: Finasteride   Has the patient contacted their pharmacy? Yes.   Pt called stating that the pharmacy has been trying to contact PCP with refill request with no response. Please advise. (Agent: If no, request that the patient contact the pharmacy for the refill.) (Agent: If yes, when and what did the pharmacy advise?)  Preferred Pharmacy (with phone number or street name):  [facility] [place] to [facility] [place] [address]  Phone: [phone] Fax: [phone]  Hours: Not open 24 hours     Agent: Please be advised that RX refills may take up to 3 business days. We ask that you follow-up with your pharmacy.

## 2020-04-04 ENCOUNTER — Encounter
Admission: RE | Admit: 2020-04-04 | Discharge: 2020-04-04 | Disposition: A | Discharge status: Home / Self Care | Payer: Medicare HMO | Source: Ambulatory Visit | Attending: Orthopedic Surgery | Admitting: Orthopedic Surgery

## 2020-04-04 ENCOUNTER — Other Ambulatory Visit: Payer: Self-pay

## 2020-04-04 DIAGNOSIS — Z01818 Encounter for other preprocedural examination: Secondary | ICD-10-CM | POA: Diagnosis not present

## 2020-04-04 DIAGNOSIS — I491 Atrial premature depolarization: Secondary | ICD-10-CM | POA: Diagnosis not present

## 2020-04-04 LAB — URINALYSIS, ROUTINE W REFLEX MICROSCOPIC
Bilirubin Urine: NEGATIVE
Glucose, UA: NEGATIVE mg/dL
Hgb urine dipstick: NEGATIVE
Ketones, ur: NEGATIVE mg/dL
Leukocytes,Ua: NEGATIVE
Nitrite: NEGATIVE
Protein, ur: NEGATIVE mg/dL
Specific Gravity, Urine: 1.018 (ref 1.005–1.030)
pH: 5 (ref 5.0–8.0)

## 2020-04-04 LAB — COMPREHENSIVE METABOLIC PANEL
ALT: 18 U/L (ref 0–44)
AST: 26 U/L (ref 15–41)
Albumin: 4.2 g/dL (ref 3.5–5.0)
Alkaline Phosphatase: 61 U/L (ref 38–126)
Anion gap: 9 (ref 5–15)
BUN: 27 mg/dL — ABNORMAL HIGH (ref 8–23)
CO2: 23 mmol/L (ref 22–32)
Calcium: 9.6 mg/dL (ref 8.9–10.3)
Chloride: 106 mmol/L (ref 98–111)
Creatinine, Ser: 1.2 mg/dL (ref 0.61–1.24)
GFR, Estimated: >60 mL/min (ref >60)
Glucose, Bld: 135 mg/dL — ABNORMAL HIGH (ref 70–99)
Potassium: 4.2 mmol/L (ref 3.5–5.1)
Sodium: 138 mmol/L (ref 135–145)
Total Bilirubin: 0.8 mg/dL (ref 0.3–1.2)
Total Protein: 6.6 g/dL (ref 6.5–8.1)

## 2020-04-04 LAB — PROTIME-INR
INR: 1.1 (ref 0.8–1.2)
Prothrombin Time: 14.1 seconds (ref 11.4–15.2)

## 2020-04-04 LAB — CBC
HCT: 34.3 % — ABNORMAL LOW (ref 39.0–52.0)
Hemoglobin: 11.8 g/dL — ABNORMAL LOW (ref 13.0–17.0)
MCH: 32.3 pg (ref 26.0–34.0)
MCHC: 34.4 g/dL (ref 30.0–36.0)
MCV: 94 fL (ref 80.0–100.0)
Platelets: 196 10*3/uL (ref 150–400)
RBC: 3.65 MIL/uL — ABNORMAL LOW (ref 4.22–5.81)
RDW: 12.7 % (ref 11.5–15.5)
WBC: 5.4 10*3/uL (ref 4.0–10.5)
nRBC: 0 % (ref 0.0–0.2)

## 2020-04-04 LAB — APTT: aPTT: 37 seconds — ABNORMAL HIGH (ref 24–36)

## 2020-04-04 LAB — C-REACTIVE PROTEIN: CRP: 0.7 mg/dL (ref <1.0)

## 2020-04-04 LAB — SURGICAL PCR SCREEN
MRSA, PCR: NEGATIVE
Staphylococcus aureus: NEGATIVE

## 2020-04-04 LAB — SEDIMENTATION RATE: Sed Rate: 12 mm/hr (ref 0–20)

## 2020-04-04 LAB — TYPE AND SCREEN
ABO/RH(D): O POS
Antibody Screen: NEGATIVE

## 2020-04-04 LAB — HEMOGLOBIN A1C
Hgb A1c MFr Bld: 6.8 % — ABNORMAL HIGH (ref 4.8–5.6)
Mean Plasma Glucose: 148.46 mg/dL

## 2020-04-04 NOTE — Patient Instructions (Signed)
Your procedure is scheduled on: April 16, 2020 MONDAY Report to the Registration Desk on the 1st floor of the [facility]. To find out your arrival time, please call [phone] between 1PM - 3PM on: Friday April 13, 2020  REMEMBER: Instructions that are not followed completely may result in serious medical risk, up to and including death; or upon the discretion of your surgeon and anesthesiologist your surgery may need to be rescheduled.  Do not eat food after midnight the night before surgery.  No gum chewing, lozengers or hard candies.  You may however, drink CLEAR liquids up to 2 hours before you are scheduled to arrive for your surgery. Do not drink anything within 2 hours of your scheduled arrival time.  Clear liquids include: - water   Type 1 and Type 2 diabetics should only drink water.  TAKE THESE MEDICATIONS THE MORNING OF SURGERY WITH A SIP OF WATER: AMLODIPINE CELEBREX TAMSULOSIN FINASTERIDE STOOL SOFTENER PANTOPRAZOLE (take one the night before and one on the morning of surgery - helps to prevent nausea after surgery.)  Stop Metformin and Januvia 2 days prior to surgery.  LAST DOSE 04/13/2020  Follow recommendations from Cardiologist, Pulmonologist or PCP regarding stopping Aspirin, Coumadin, Plavix, Eliquis, Pradaxa, or Pletal. LAST DOSE OF ELIQUIS IS 04/12/2020 THURSDAY  One week prior to surgery: Stop Anti-inflammatories (NSAIDS) such as Advil, Aleve, Ibuprofen, Motrin, Naproxen, Naprosyn and Aspirin based products such as Excedrin, Goodys Powder, BC Powder. Stop ANY OVER THE COUNTER supplements until after surgery.  No Alcohol for 24 hours before or after surgery.  No Smoking including e-cigarettes for 24 hours prior to surgery.  No chewable tobacco products for at least 6 hours prior to surgery.  No nicotine patches on the day of surgery.  Do not use any "recreational" drugs for at least a week prior to your surgery.  Please be advised that the combination of  cocaine and anesthesia may have negative outcomes, up to and including death. If you test positive for cocaine, your surgery will be cancelled.  On the morning of surgery brush your teeth with toothpaste and water, you may rinse your mouth with mouthwash if you wish. Do not swallow any toothpaste or mouthwash.  Do not wear jewelry, make-up, hairpins, clips or nail polish.  Do not wear lotions, powders, or perfumes.   Do not shave body from the neck down 48 hours prior to surgery just in case you cut yourself which could leave a site for infection.  Also, freshly shaved skin may become irritated if using the CHG soap.  Contact lenses, hearing aids and dentures may not be worn into surgery.  Do not bring valuables to the hospital. [facility] is not responsible for any missing/lost belongings or valuables.   Use CHG Soap as directed on instruction sheet.  Notify your doctor if there is any change in your medical condition (cold, fever, infection).  Wear comfortable clothing (specific to your surgery type) to the hospital.  Plan for stool softeners for home use; pain medications have a tendency to cause constipation. You can also help prevent constipation by eating foods high in fiber such as fruits and vegetables and drinking plenty of fluids as your diet allows.  After surgery, you can help prevent lung complications by doing breathing exercises.  Take deep breaths and cough every 1-2 hours. Your doctor may order a device called an Incentive Spirometer to help you take deep breaths. When coughing or sneezing, hold a pillow firmly against your  incision with both hands. This is called "splinting." Doing this helps protect your incision. It also decreases belly discomfort.  If you are being admitted to the hospital overnight, [place]  If you are being discharged the day of surgery, you will not be allowed to drive home. You will need a responsible  adult (18 years or older) to drive you home and stay with you that night.   If you are taking public transportation, you will need to have a responsible adult (18 years or older) with you. Please confirm with your physician that it is acceptable to use public transportation.   Please call the [place] Dept. at [phone] if you have any questions about these instructions.  Surgery Visitation Policy:  Patients undergoing a surgery or procedure may have one family member or support person with them as long as that person is not COVID-19 positive or experiencing its symptoms.  That person may remain in the waiting area during the procedure.  Inpatient Visitation:    Visiting hours are 7 a.m. to 8 p.m. Inpatients will be allowed two visitors daily. The visitors may change each day during the patient's stay. No visitors under the age of 3. Any visitor under the age of 30 must be accompanied by an adult. The visitor must pass COVID-19 screenings, use hand sanitizer when entering and exiting the patient's room and wear a mask at all times, including in the patient's room. Patients must also wear a mask when staff or their visitor are in the room. Masking is required regardless of vaccination status.

## 2020-04-05 LAB — URINE CULTURE
Culture: NO GROWTH
Special Requests: NORMAL

## 2020-04-06 DIAGNOSIS — M1711 Unilateral primary osteoarthritis, right knee: Secondary | ICD-10-CM | POA: Diagnosis not present

## 2020-04-07 LAB — IGE: IgE (Immunoglobulin E), Serum: 28 IU/mL (ref 6–495)

## 2020-04-09 DIAGNOSIS — Z48813 Encounter for surgical aftercare following surgery on the respiratory system: Secondary | ICD-10-CM | POA: Diagnosis not present

## 2020-04-11 ENCOUNTER — Other Ambulatory Visit: Payer: Self-pay | Admitting: Internal Medicine

## 2020-04-11 DIAGNOSIS — E1121 Type 2 diabetes mellitus with diabetic nephropathy: Secondary | ICD-10-CM

## 2020-04-12 ENCOUNTER — Other Ambulatory Visit
Admission: RE | Admit: 2020-04-12 | Discharge: 2020-04-12 | Disposition: A | Discharge status: Home / Self Care | Payer: Medicare HMO | Source: Ambulatory Visit | Attending: Orthopedic Surgery | Admitting: Orthopedic Surgery

## 2020-04-12 ENCOUNTER — Other Ambulatory Visit: Payer: Self-pay

## 2020-04-12 DIAGNOSIS — Z01812 Encounter for preprocedural laboratory examination: Secondary | ICD-10-CM | POA: Insufficient documentation

## 2020-04-12 DIAGNOSIS — Z20822 Contact with and (suspected) exposure to covid-19: Secondary | ICD-10-CM | POA: Diagnosis not present

## 2020-04-13 LAB — SARS CORONAVIRUS 2 (TAT 6-24 HRS): SARS Coronavirus 2: NEGATIVE

## 2020-04-15 ENCOUNTER — Encounter: Payer: Self-pay | Admitting: Orthopedic Surgery

## 2020-04-15 NOTE — H&P (Signed)
ORTHOPAEDIC HISTORY & PHYSICAL Gwenlyn Fudge, [place] - 04/06/2020 10:45 AM EDT Formatting of this note is different from the original. [facility] Chief Complaint:   Chief Complaint  Patient presents with  . Knee Pain  H & P RIGHT KNEE   History of Present Illness:   Maxwell Stanley is a 86 y.o. male with past medical history including DM II that presents to clinic today for his preoperative history and evaluation. Patient presents with his wife. The patient is scheduled to undergo a right total knee arthroplasty on 04/16/20 by Dr. Marry Guan. His pain began several years ago. The pain is located along the medial and lateral aspects of the knees. He describes his pain as worse with weightbearing. He reports associated swelling with some giving way of the knees. He denies associated numbness or tingling, denies locking of the knees.   The patient's symptoms have progressed to the point that they decrease his quality of life. The patient has previously undergone conservative treatment including NSAIDS and injections to the knee without adequate control of his symptoms.  Patient denies history of lumbar surgery, DVT. Does see a cardiologist for paroxysmal atrial fibrillation. He is on anticoagulation with Eliquis.  Patient reports penicillin allergy several years ago that involved a rash. The rocephin allergy was also a few years ago and was hives.  Last A1C was 6.8 on 04/04/20.  Past Medical, Surgical, Family, Social History, Allergies, Medications:   Past Medical History:  Past Medical History:  Diagnosis Date  . Atrial fibrillation (CMS-HCC) 09/28/18  . Benign positional vertigo  . Bladder neck obstruction  . CKD stage 3 secondary to diabetes (CMS-HCC)  . Diabetes mellitus type 2, uncomplicated (CMS-HCC)  . GERD (gastroesophageal reflux disease)  . Hemorrhoids  . Hernia  . History of hyperglycemia  . History of squamous cell  carcinoma  Right ear and head.  . Hyperlipidemia  . Hypertension  . Osteoarthritis  . Paroxysmal A-fib (CMS-HCC)  . Persistent atrial fibrillation (CMS-HCC)  . Type 2 diabetes mellitus with diabetic nephropathy, without long-term current use of insulin (CMS-HCC)   Past Surgical History:  Past Surgical History:  Procedure Laterality Date  . Hemorrhoidectomy  . HERNIA REPAIR 04/01/2010  . Left ankle surgery 1999  Hardware in place.  . Reattachment of detached retina  . Right knee surgery 2006  . Right thumb [facility] surgery 02/03/2011  Tendon interpositional arthroplasty CMC joint right thumb.  [name] Squamous cell carcinoma removed 2012  Squamous cell carcinoma removed from right ear and head.  . TONSILLECTOMY   Current Medications:  Current Outpatient Medications  Medication Sig Dispense Refill  . acetaminophen (TYLENOL) 650 MG ER tablet Take 650 mg by mouth every 8 (eight) hours as needed for Pain.  [name] amLODIPine (NORVASC) 2.5 MG tablet TAKE 1 TABLET BY MOUTH EVERY DAY  . atenolol (TENORMIN) 50 MG tablet Take 50 mg by mouth once daily.  . blood glucose diagnostic (ONETOUCH ULTRA TEST) test strip once daily  . celecoxib (CELEBREX) 200 MG capsule Take 1 capsule (200 mg total) by mouth 2 (two) times daily 180 capsule 1  . cholecalciferol (CHOLECALCIFEROL) 1,000 unit tablet Take 1,000 Units by mouth once daily  . clotrimazole (LOTRIMIN) 1 % cream Apply topically 2 (two) times daily APPLY TO AFFECTED AREA  . docusate (COLACE) 100 MG capsule Take 100 mg by mouth 3 (three) times daily as needed for Constipation.  [name] ELIQUIS 5 mg tablet TAKE 1 TABLET TWICE A  DAY 180 tablet 1  . finasteride (PROSCAR) 5 mg tablet Take 5 mg by mouth once daily  . fluticasone propionate (FLONASE) 50 mcg/actuation nasal spray Place 1 spray into both nostrils as needed  . glucosamine/chondr su A sod (OSTEO BI-FLEX ORAL) Take 1 caplet by mouth 2 (two) times daily  . hydroCHLOROthiazide (MICROZIDE) 12.5 mg capsule Take  12.5 mg by mouth once daily  . lactobac cmb #3-fos-pantethine (PROBIOTIC & ACIDOPHILUS) 300-250 million cell-mg Cap Take 1 capsule by mouth once daily  . losartan (COZAAR) 100 MG tablet 100 mg once daily  . melatonin 10 mg Cap Take 1 capsule by mouth nightly as needed  . metFORMIN (GLUCOPHAGE) 500 MG tablet Take 1,500 mg by mouth nightly  . multivitamin tablet Take 1 tablet by mouth once daily.  . mupirocin (BACTROBAN) 2 % ointment 1 application 2 (two) times daily.  [name] DELICA PLUS LANCET 1 each 3 (three) times daily  . pantoprazole (PROTONIX) 40 MG DR tablet Take 40 mg by mouth once daily.  . rosuvastatin (CRESTOR) 20 MG tablet Take 20 mg by mouth once daily.  [name] SITagliptin (JANUVIA) 100 MG tablet Take 100 mg by mouth once daily  . tamsulosin (FLOMAX) 0.4 mg capsule Take 0.4 mg by mouth once daily. Take 30 minutes after same meal each day.  . traMADoL (ULTRAM) 50 mg tablet 1-2 TABS EVERY 6 HOURS AS NEEDED FOR SEVERE PAIN.  [name] triamcinolone 0.1 % cream Apply topically once daily as needed  . clindamycin (CLEOCIN) 300 MG capsule Take 300 mg by mouth 3 (three) times daily  . losartan (COZAAR) 50 MG tablet Take 50 mg by mouth 2 (two) times daily   No current facility-administered medications for this visit.   Allergies:  Allergies  Allergen Reactions  . Codeine Hallucination  Altered mental status  . Penicillin G Rash  Did it involve swelling of the face/tongue/throat, SOB, or low BP? No Did it involve sudden or severe rash/hives, skin peeling, or any reaction on the inside of your mouth or nose? No Did you need to seek medical attention at a hospital or doctor's office? No When did it last happen? If all above answers are "NO", may proceed with cephalosporin use.  . Rocephin [Ceftriaxone] Rash   Social History:  Social History   Socioeconomic History  . Marital status: Married  Spouse name: Vaughan Basta  . Number of children: 2  . Years of education: 64  . Highest  education level: Not on file  Occupational History  . Occupation: Retired- Press photographer  Tobacco Use  . Smoking status: Never Smoker  . Smokeless tobacco: Never Used  Substance and Sexual Activity  . Alcohol use: Never  Alcohol/week: 0.0 standard drinks  . Drug use: Never  . Sexual activity: Yes  Partners: Female  Birth control/protection: None  Other Topics Concern  . Not on file  Social History Narrative  . Not on file   Social Determinants of Health   Financial Resource Strain: Not on file  Food Insecurity: Not on file  Transportation Needs: Not on file  Physical Activity: Not on file  Stress: Not on file  Social Connections: Not on file  Housing Stability: Not on file   Family History:  Family History  Problem Relation Age of Onset  . Osteoporosis (Thinning of bones) Mother  . Diabetes type II Mother  . Gallbladder disease Mother  . Myocardial Infarction (Heart attack) Father   Review of Systems:   A 10+ ROS was performed, reviewed,  and the pertinent orthopaedic findings are documented in the HPI.   Physical Examination:   BP 130/80 (BP Location: Left upper arm, Patient Position: Sitting, BP Cuff Size: Adult)  Ht 188 cm (6\' 2" )  Wt 84.3 kg (185 lb 12.8 oz)  BMI 23.86 kg/m   Patient is a well-developed, well-nourished male in no acute distress. Patient has normal mood and affect. Patient is alert and oriented to person, place, and time.   HEENT: Atraumatic, normocephalic. Pupils equal and reactive to light. Extraocular motion intact. Noninjected sclera.  Cardiovascular: Regular rate and rhythm, with no murmurs, rubs, or gallops. Distal pulses auscultated with doppler. No bruits.   Respiratory: Lungs clear to auscultation bilaterally.   Right Knee: Soft tissue swelling: minimal Effusion: none Erythema: none Crepitance: mild Tenderness: medial, lateral Alignment: relative valgus Mediolateral laxity: lateral pseudolaxity Posterior sag: negative Patellar  tracking: Good tracking without evidence of subluxation or tilt Atrophy: No significant atrophy.  Quadriceps tone was fair to good. Range of motion: 0/15/103 degrees   Sensation intact over the saphenous, lateral sural cutaneous, superficial fibular, and deep fibular nerve distributions.  Tests Performed/Reviewed:  X-rays  Anteroposterior, lateral, and sunrise views of the right knee were obtained. Images reveal complete loss of medial compartment joint space with osteophyte formation. Loss of lateral and patellofemoral joint space also noted. No fractures or dislocations.  I personally ordered and interpreted these radiographs.   Impression:   ICD-10-CM  1. Primary osteoarthritis of right knee M17.11   Plan:   The patient has end-stage degenerative changes of the right knee. It was explained to the patient that the condition is progressive in nature. Having failed conservative treatment, the patient has elected to proceed with a total joint arthroplasty. The patient will undergo a total joint arthroplasty with Dr. Marry Guan. The risks of surgery, including blood clot and infection, were discussed with the patient. Measures to reduce these risks, including the use of anticoagulation, perioperative antibiotics, and early ambulation were discussed. The importance of postoperative physical therapy was discussed with the patient. The patient elects to proceed with surgery. The patient is instructed to stop all blood thinners prior to surgery. The patient is instructed to call the hospital the day before surgery to learn of the proper arrival time.   Contact our office with any questions or concerns. Follow up as indicated, or sooner should any new problems arise, if conditions worsen, or if they are otherwise concerned.   Gwenlyn Fudge, PA [facility] [address], [place] [address] Phone: [phone]  This note was generated in part  with voice recognition software and I apologize for any typographical errors that were not detected and corrected.  Electronically signed by Gwenlyn Fudge, PA at 04/06/2020 10:21 PM EDT

## 2020-04-16 ENCOUNTER — Ambulatory Visit: Payer: Medicare HMO | Admitting: Urgent Care

## 2020-04-16 ENCOUNTER — Encounter
Admission: RE | Disposition: A | Discharge status: Home / Self Care | Payer: Self-pay | Source: Home / Self Care | Attending: Orthopedic Surgery

## 2020-04-16 ENCOUNTER — Observation Stay: Payer: Medicare HMO

## 2020-04-16 ENCOUNTER — Observation Stay
Admission: RE | Admit: 2020-04-16 | Discharge: 2020-04-17 | Disposition: A | Discharge status: Home / Self Care | Payer: Medicare HMO | Attending: Orthopedic Surgery | Admitting: Orthopedic Surgery

## 2020-04-16 ENCOUNTER — Encounter: Payer: Self-pay | Admitting: Orthopedic Surgery

## 2020-04-16 ENCOUNTER — Other Ambulatory Visit: Payer: Self-pay

## 2020-04-16 DIAGNOSIS — Z79899 Other long term (current) drug therapy: Secondary | ICD-10-CM | POA: Insufficient documentation

## 2020-04-16 DIAGNOSIS — M1711 Unilateral primary osteoarthritis, right knee: Principal | ICD-10-CM | POA: Insufficient documentation

## 2020-04-16 DIAGNOSIS — E1122 Type 2 diabetes mellitus with diabetic chronic kidney disease: Secondary | ICD-10-CM | POA: Insufficient documentation

## 2020-04-16 DIAGNOSIS — Z7984 Long term (current) use of oral hypoglycemic drugs: Secondary | ICD-10-CM | POA: Insufficient documentation

## 2020-04-16 DIAGNOSIS — E785 Hyperlipidemia, unspecified: Secondary | ICD-10-CM | POA: Diagnosis not present

## 2020-04-16 DIAGNOSIS — Z85828 Personal history of other malignant neoplasm of skin: Secondary | ICD-10-CM | POA: Insufficient documentation

## 2020-04-16 DIAGNOSIS — I129 Hypertensive chronic kidney disease with stage 1 through stage 4 chronic kidney disease, or unspecified chronic kidney disease: Secondary | ICD-10-CM | POA: Insufficient documentation

## 2020-04-16 DIAGNOSIS — M25561 Pain in right knee: Secondary | ICD-10-CM | POA: Diagnosis present

## 2020-04-16 DIAGNOSIS — Z7901 Long term (current) use of anticoagulants: Secondary | ICD-10-CM | POA: Diagnosis not present

## 2020-04-16 DIAGNOSIS — N183 Chronic kidney disease, stage 3 unspecified: Secondary | ICD-10-CM | POA: Diagnosis not present

## 2020-04-16 DIAGNOSIS — E1169 Type 2 diabetes mellitus with other specified complication: Secondary | ICD-10-CM | POA: Diagnosis not present

## 2020-04-16 DIAGNOSIS — Z96659 Presence of unspecified artificial knee joint: Secondary | ICD-10-CM

## 2020-04-16 DIAGNOSIS — Z96651 Presence of right artificial knee joint: Secondary | ICD-10-CM | POA: Diagnosis not present

## 2020-04-16 DIAGNOSIS — K2100 Gastro-esophageal reflux disease with esophagitis, without bleeding: Secondary | ICD-10-CM | POA: Diagnosis not present

## 2020-04-16 HISTORY — PX: KNEE ARTHROPLASTY: SHX992

## 2020-04-16 LAB — GLUCOSE, CAPILLARY
Glucose-Capillary: 172 mg/dL — ABNORMAL HIGH (ref 70–99)
Glucose-Capillary: 210 mg/dL — ABNORMAL HIGH (ref 70–99)
Glucose-Capillary: 238 mg/dL — ABNORMAL HIGH (ref 70–99)
Glucose-Capillary: 331 mg/dL — ABNORMAL HIGH (ref 70–99)
Glucose-Capillary: 302 mg/dL — ABNORMAL HIGH (ref 70–99)

## 2020-04-16 SURGERY — ARTHROPLASTY, KNEE, TOTAL, USING IMAGELESS COMPUTER-ASSISTED NAVIGATION
Anesthesia: Spinal | Site: Knee | Laterality: Right

## 2020-04-16 MED ORDER — LOSARTAN POTASSIUM 50 MG PO TABS
50.0000 mg | ORAL_TABLET | Freq: Two times a day (BID) | ORAL | Status: DC
  Administered 2020-04-16 – 2020-04-17 (×2): 50 mg via ORAL
  Filled 2020-04-16 (×2): qty 1

## 2020-04-16 MED ORDER — OXYCODONE HCL 5 MG PO TABS: 5.0000 mg | ORAL_TABLET | ORAL | Status: DC | PRN

## 2020-04-16 MED ORDER — APIXABAN 5 MG PO TABS
5.0000 mg | ORAL_TABLET | Freq: Two times a day (BID) | ORAL | Status: DC
  Administered 2020-04-17: 5 mg via ORAL
  Filled 2020-04-16: qty 1

## 2020-04-16 MED ORDER — CEFAZOLIN SODIUM-DEXTROSE 2-4 GM/100ML-% IV SOLN
INTRAVENOUS | Status: AC
  Filled 2020-04-16: qty 100

## 2020-04-16 MED ORDER — ATENOLOL 50 MG PO TABS
50.0000 mg | ORAL_TABLET | Freq: Every day | ORAL | Status: DC
  Filled 2020-04-16: qty 1

## 2020-04-16 MED ORDER — PROPOFOL 10 MG/ML IV BOLUS
INTRAVENOUS | Status: AC
  Filled 2020-04-16: qty 20

## 2020-04-16 MED ORDER — BISACODYL 10 MG RE SUPP: 10.0000 mg | Freq: Every day | RECTAL | Status: DC | PRN

## 2020-04-16 MED ORDER — ORAL CARE MOUTH RINSE: 15.0000 mL | Freq: Once | OROMUCOSAL | Status: AC

## 2020-04-16 MED ORDER — BUPIVACAINE HCL (PF) 0.25 % IJ SOLN
INTRAMUSCULAR | Status: DC | PRN
  Administered 2020-04-16: 60 mL

## 2020-04-16 MED ORDER — BUPIVACAINE HCL (PF) 0.5 % IJ SOLN
INTRAMUSCULAR | Status: DC | PRN
  Administered 2020-04-16: 3 mL

## 2020-04-16 MED ORDER — SODIUM CHLORIDE 0.9 % IV SOLN: INTRAVENOUS | Status: DC

## 2020-04-16 MED ORDER — SURGIPHOR WOUND IRRIGATION SYSTEM - OPTIME
TOPICAL | Status: DC | PRN
  Administered 2020-04-16: 400 mL via TOPICAL

## 2020-04-16 MED ORDER — SENNOSIDES-DOCUSATE SODIUM 8.6-50 MG PO TABS
1.0000 | ORAL_TABLET | Freq: Two times a day (BID) | ORAL | Status: DC
  Administered 2020-04-16 – 2020-04-17 (×2): 1 via ORAL
  Filled 2020-04-16 (×2): qty 1

## 2020-04-16 MED ORDER — TRANEXAMIC ACID-NACL 1000-0.7 MG/100ML-% IV SOLN: 1000.0000 mg | Freq: Once | INTRAVENOUS | Status: AC

## 2020-04-16 MED ORDER — CHLORHEXIDINE GLUCONATE 4 % EX LIQD
60.0000 mL | Freq: Once | CUTANEOUS | Status: AC
  Administered 2020-04-16: 4 via TOPICAL

## 2020-04-16 MED ORDER — ONDANSETRON HCL 4 MG/2ML IJ SOLN
INTRAMUSCULAR | Status: DC | PRN
  Administered 2020-04-16: 4 mg via INTRAVENOUS

## 2020-04-16 MED ORDER — CEFAZOLIN SODIUM-DEXTROSE 2-4 GM/100ML-% IV SOLN
2.0000 g | INTRAVENOUS | Status: AC
  Administered 2020-04-16: 2 g via INTRAVENOUS

## 2020-04-16 MED ORDER — OXYCODONE HCL 5 MG PO TABS: 10.0000 mg | ORAL_TABLET | ORAL | Status: DC | PRN

## 2020-04-16 MED ORDER — LINAGLIPTIN 5 MG PO TABS
5.0000 mg | ORAL_TABLET | Freq: Every day | ORAL | Status: DC
  Administered 2020-04-16 – 2020-04-17 (×2): 5 mg via ORAL
  Filled 2020-04-16 (×2): qty 1

## 2020-04-16 MED ORDER — MENTHOL 3 MG MT LOZG
1.0000 | LOZENGE | OROMUCOSAL | Status: DC | PRN
  Filled 2020-04-16: qty 9

## 2020-04-16 MED ORDER — DIPHENHYDRAMINE HCL 12.5 MG/5ML PO ELIX: 12.5000 mg | ORAL_SOLUTION | ORAL | Status: DC | PRN

## 2020-04-16 MED ORDER — FLEET ENEMA 7-19 GM/118ML RE ENEM: 1.0000 | ENEMA | Freq: Once | RECTAL | Status: DC | PRN

## 2020-04-16 MED ORDER — NEOMYCIN-POLYMYXIN B GU 40-200000 IR SOLN
Status: DC | PRN
  Administered 2020-04-16: 16 mL

## 2020-04-16 MED ORDER — FERROUS SULFATE 325 (65 FE) MG PO TABS
325.0000 mg | ORAL_TABLET | Freq: Two times a day (BID) | ORAL | Status: DC
  Administered 2020-04-16 – 2020-04-17 (×2): 325 mg via ORAL
  Filled 2020-04-16 (×2): qty 1

## 2020-04-16 MED ORDER — ACETAMINOPHEN 325 MG PO TABS
325.0000 mg | ORAL_TABLET | Freq: Four times a day (QID) | ORAL | Status: DC | PRN
Start: 2020-04-16 — End: 2020-04-17

## 2020-04-16 MED ORDER — ROSUVASTATIN CALCIUM 20 MG PO TABS
20.0000 mg | ORAL_TABLET | Freq: Every day | ORAL | Status: DC
  Administered 2020-04-16: 20 mg via ORAL
  Filled 2020-04-16: qty 1
  Filled 2020-04-16: qty 2
  Filled 2020-04-16: qty 1

## 2020-04-16 MED ORDER — TRANEXAMIC ACID-NACL 1000-0.7 MG/100ML-% IV SOLN
1000.0000 mg | INTRAVENOUS | Status: AC
  Administered 2020-04-16: 1000 mg via INTRAVENOUS

## 2020-04-16 MED ORDER — TRANEXAMIC ACID-NACL 1000-0.7 MG/100ML-% IV SOLN
INTRAVENOUS | Status: AC
  Filled 2020-04-16: qty 100

## 2020-04-16 MED ORDER — ACETAMINOPHEN 10 MG/ML IV SOLN
INTRAVENOUS | Status: AC
  Filled 2020-04-16: qty 100

## 2020-04-16 MED ORDER — DEXAMETHASONE SODIUM PHOSPHATE 10 MG/ML IJ SOLN: 8.0000 mg | Freq: Once | INTRAMUSCULAR | Status: AC

## 2020-04-16 MED ORDER — FINASTERIDE 5 MG PO TABS
5.0000 mg | ORAL_TABLET | Freq: Every day | ORAL | Status: DC
  Administered 2020-04-16 – 2020-04-17 (×2): 5 mg via ORAL
  Filled 2020-04-16 (×2): qty 1

## 2020-04-16 MED ORDER — PROPOFOL 10 MG/ML IV BOLUS
INTRAVENOUS | Status: DC | PRN
  Administered 2020-04-16: 30 mg via INTRAVENOUS
  Administered 2020-04-16: 20 mg via INTRAVENOUS

## 2020-04-16 MED ORDER — INSULIN ASPART 100 UNIT/ML ~~LOC~~ SOLN: 6.0000 [IU] | Freq: Once | SUBCUTANEOUS | Status: DC

## 2020-04-16 MED ORDER — ACETAMINOPHEN 10 MG/ML IV SOLN
1000.0000 mg | Freq: Four times a day (QID) | INTRAVENOUS | Status: DC
  Administered 2020-04-16 – 2020-04-17 (×3): 1000 mg via INTRAVENOUS
  Filled 2020-04-16 (×4): qty 100

## 2020-04-16 MED ORDER — ALUM & MAG HYDROXIDE-SIMETH 200-200-20 MG/5ML PO SUSP: 30.0000 mL | ORAL | Status: DC | PRN

## 2020-04-16 MED ORDER — SODIUM CHLORIDE 0.9 % IV SOLN
INTRAVENOUS | Status: DC | PRN
  Administered 2020-04-16: 60 mL

## 2020-04-16 MED ORDER — CELECOXIB 200 MG PO CAPS
ORAL_CAPSULE | ORAL | Status: AC
  Administered 2020-04-16: 400 mg via ORAL
  Filled 2020-04-16: qty 2

## 2020-04-16 MED ORDER — CHLORHEXIDINE GLUCONATE 0.12 % MT SOLN
15.0000 mL | Freq: Once | OROMUCOSAL | Status: AC
  Administered 2020-04-16: 15 mL via OROMUCOSAL

## 2020-04-16 MED ORDER — SEVOFLURANE IN SOLN
RESPIRATORY_TRACT | Status: AC
  Filled 2020-04-16: qty 250

## 2020-04-16 MED ORDER — HYDROMORPHONE HCL 1 MG/ML IJ SOLN: 0.5000 mg | INTRAMUSCULAR | Status: DC | PRN

## 2020-04-16 MED ORDER — CEFAZOLIN SODIUM-DEXTROSE 2-4 GM/100ML-% IV SOLN
2.0000 g | Freq: Four times a day (QID) | INTRAVENOUS | Status: AC
  Administered 2020-04-16 (×2): 2 g via INTRAVENOUS
  Filled 2020-04-16 (×2): qty 100

## 2020-04-16 MED ORDER — MAGNESIUM HYDROXIDE 400 MG/5ML PO SUSP
30.0000 mL | Freq: Every day | ORAL | Status: DC
  Administered 2020-04-16: 30 mL via ORAL
  Filled 2020-04-16: qty 30

## 2020-04-16 MED ORDER — TAMSULOSIN HCL 0.4 MG PO CAPS
0.4000 mg | ORAL_CAPSULE | Freq: Every day | ORAL | Status: DC
  Administered 2020-04-17: 0.4 mg via ORAL
  Filled 2020-04-16: qty 1

## 2020-04-16 MED ORDER — INSULIN ASPART 100 UNIT/ML ~~LOC~~ SOLN
7.0000 [IU] | SUBCUTANEOUS | Status: DC
  Administered 2020-04-16: 7 [IU] via SUBCUTANEOUS

## 2020-04-16 MED ORDER — ADULT MULTIVITAMIN W/MINERALS CH
0.5000 | ORAL_TABLET | Freq: Every day | ORAL | Status: DC
  Administered 2020-04-16 – 2020-04-17 (×2): 0.5 via ORAL
  Filled 2020-04-16 (×2): qty 1

## 2020-04-16 MED ORDER — TRANEXAMIC ACID-NACL 1000-0.7 MG/100ML-% IV SOLN
INTRAVENOUS | Status: AC
  Administered 2020-04-16: 1000 mg via INTRAVENOUS
  Filled 2020-04-16: qty 100

## 2020-04-16 MED ORDER — CELECOXIB 200 MG PO CAPS
200.0000 mg | ORAL_CAPSULE | Freq: Two times a day (BID) | ORAL | Status: DC
  Administered 2020-04-16 – 2020-04-17 (×2): 200 mg via ORAL
  Filled 2020-04-16 (×2): qty 1

## 2020-04-16 MED ORDER — ONDANSETRON HCL 4 MG/2ML IJ SOLN: 4.0000 mg | Freq: Once | INTRAMUSCULAR | Status: DC | PRN

## 2020-04-16 MED ORDER — RISAQUAD PO CAPS
1.0000 | ORAL_CAPSULE | Freq: Every day | ORAL | Status: DC
  Administered 2020-04-16 – 2020-04-17 (×2): 1 via ORAL
  Filled 2020-04-16 (×2): qty 1

## 2020-04-16 MED ORDER — AMLODIPINE BESYLATE 5 MG PO TABS
2.5000 mg | ORAL_TABLET | Freq: Every day | ORAL | Status: DC
  Administered 2020-04-17: 2.5 mg via ORAL
  Filled 2020-04-16: qty 1

## 2020-04-16 MED ORDER — METOCLOPRAMIDE HCL 10 MG PO TABS
10.0000 mg | ORAL_TABLET | Freq: Three times a day (TID) | ORAL | Status: DC
  Administered 2020-04-16 – 2020-04-17 (×4): 10 mg via ORAL
  Filled 2020-04-16 (×4): qty 1

## 2020-04-16 MED ORDER — ONDANSETRON HCL 4 MG/2ML IJ SOLN: 4.0000 mg | Freq: Four times a day (QID) | INTRAMUSCULAR | Status: DC | PRN

## 2020-04-16 MED ORDER — CELECOXIB 200 MG PO CAPS: 400.0000 mg | ORAL_CAPSULE | Freq: Once | ORAL | Status: AC

## 2020-04-16 MED ORDER — CLOTRIMAZOLE 1 % EX CREA
1.0000 "application " | TOPICAL_CREAM | Freq: Two times a day (BID) | CUTANEOUS | Status: DC | PRN
  Filled 2020-04-16: qty 15

## 2020-04-16 MED ORDER — ONDANSETRON HCL 4 MG PO TABS: 4.0000 mg | ORAL_TABLET | Freq: Four times a day (QID) | ORAL | Status: DC | PRN

## 2020-04-16 MED ORDER — VITAMIN D 25 MCG (1000 UNIT) PO TABS
1000.0000 [IU] | ORAL_TABLET | Freq: Every day | ORAL | Status: DC
  Administered 2020-04-17: 1000 [IU] via ORAL
  Filled 2020-04-16: qty 1

## 2020-04-16 MED ORDER — ACETAMINOPHEN 10 MG/ML IV SOLN
INTRAVENOUS | Status: DC | PRN
  Administered 2020-04-16: 1000 mg via INTRAVENOUS

## 2020-04-16 MED ORDER — FENTANYL CITRATE (PF) 100 MCG/2ML IJ SOLN: 25.0000 ug | INTRAMUSCULAR | Status: DC | PRN

## 2020-04-16 MED ORDER — TRIAMCINOLONE ACETONIDE 0.1 % EX CREA
1.0000 "application " | TOPICAL_CREAM | Freq: Every day | CUTANEOUS | Status: DC | PRN
  Filled 2020-04-16: qty 15

## 2020-04-16 MED ORDER — TRAMADOL HCL 50 MG PO TABS: 50.0000 mg | ORAL_TABLET | ORAL | Status: DC | PRN

## 2020-04-16 MED ORDER — FLUTICASONE PROPIONATE 50 MCG/ACT NA SUSP
2.0000 | Freq: Every day | NASAL | Status: DC | PRN
  Filled 2020-04-16: qty 16

## 2020-04-16 MED ORDER — SODIUM CHLORIDE 0.9 % IV SOLN
INTRAVENOUS | Status: DC | PRN
  Administered 2020-04-16: 30 ug/min via INTRAVENOUS

## 2020-04-16 MED ORDER — DEXAMETHASONE SODIUM PHOSPHATE 10 MG/ML IJ SOLN
INTRAMUSCULAR | Status: AC
  Administered 2020-04-16: 8 mg via INTRAVENOUS
  Filled 2020-04-16: qty 1

## 2020-04-16 MED ORDER — INSULIN ASPART 100 UNIT/ML ~~LOC~~ SOLN
0.0000 [IU] | Freq: Three times a day (TID) | SUBCUTANEOUS | Status: DC
  Administered 2020-04-16: 5 [IU] via SUBCUTANEOUS
  Administered 2020-04-17 (×2): 3 [IU] via SUBCUTANEOUS
  Filled 2020-04-16 (×5): qty 1

## 2020-04-16 MED ORDER — PHENOL 1.4 % MT LIQD
1.0000 | OROMUCOSAL | Status: DC | PRN
  Filled 2020-04-16: qty 177

## 2020-04-16 MED ORDER — METFORMIN HCL ER 500 MG PO TB24
1500.0000 mg | ORAL_TABLET | Freq: Every day | ORAL | Status: DC
  Administered 2020-04-16: 1500 mg via ORAL
  Filled 2020-04-16: qty 3
  Filled 2020-04-16: qty 2
  Filled 2020-04-16: qty 3

## 2020-04-16 MED ORDER — PANTOPRAZOLE SODIUM 40 MG PO TBEC
40.0000 mg | DELAYED_RELEASE_TABLET | Freq: Two times a day (BID) | ORAL | Status: DC
  Administered 2020-04-16 – 2020-04-17 (×2): 40 mg via ORAL
  Filled 2020-04-16 (×2): qty 1

## 2020-04-16 MED ORDER — HYDROCHLOROTHIAZIDE 12.5 MG PO CAPS
12.5000 mg | ORAL_CAPSULE | Freq: Every day | ORAL | Status: DC
  Administered 2020-04-16 – 2020-04-17 (×2): 12.5 mg via ORAL
  Filled 2020-04-16 (×2): qty 1

## 2020-04-16 MED ORDER — PROPOFOL 500 MG/50ML IV EMUL
INTRAVENOUS | Status: DC | PRN
  Administered 2020-04-16: 100 ug/kg/min via INTRAVENOUS

## 2020-04-16 SURGICAL SUPPLY — 77 items
ATTUNE MED DOME PAT 41 KNEE (Knees) ×1 IMPLANT
ATTUNE PS FEM RT SZ 8 CEM KNEE (Femur) ×1 IMPLANT
ATTUNE PSRP INSR SZ8 6 KNEE (Insert) ×1 IMPLANT
BASE TIBIAL ROT PLAT SZ 8 KNEE (Knees) IMPLANT
BATTERY INSTRU NAVIGATION (MISCELLANEOUS) ×8 IMPLANT
BLADE SAW 70X12.5 (BLADE) ×2 IMPLANT
BLADE SAW 90X13X1.19 OSCILLAT (BLADE) ×2 IMPLANT
BLADE SAW 90X25X1.19 OSCILLAT (BLADE) ×2 IMPLANT
BONE CEMENT GENTAMICIN (Cement) ×4 IMPLANT
CANISTER SUCT 3000ML PPV (MISCELLANEOUS) ×1 IMPLANT
CEMENT BONE GENTAMICIN 40 (Cement) IMPLANT
COOLER POLAR GLACIER W/PUMP (MISCELLANEOUS) ×2 IMPLANT
COVER WAND RF STERILE (DRAPES) ×2 IMPLANT
CUFF TOURN SGL QUICK 24 (TOURNIQUET CUFF)
CUFF TOURN SGL QUICK 30 (TOURNIQUET CUFF) ×1
CUFF TRNQT CYL 24X4X16.5-23 (TOURNIQUET CUFF) IMPLANT
CUFF TRNQT CYL 30X4X21-28X (TOURNIQUET CUFF) IMPLANT
DRAPE 3/4 80X56 (DRAPES) ×2 IMPLANT
DRESSING PEEL AND PLAC PRVNA20 (GAUZE/BANDAGES/DRESSINGS) ×1 IMPLANT
DRSG DERMACEA 8X12 NADH (GAUZE/BANDAGES/DRESSINGS) ×2 IMPLANT
DRSG MEPILEX SACRM 8.7X9.8 (GAUZE/BANDAGES/DRESSINGS) ×2 IMPLANT
DRSG OPSITE POSTOP 4X14 (GAUZE/BANDAGES/DRESSINGS) ×2 IMPLANT
DRSG PEEL AND PLACE PREVENA 20 (GAUZE/BANDAGES/DRESSINGS)
DRSG TEGADERM 4X4.75 (GAUZE/BANDAGES/DRESSINGS) ×2 IMPLANT
DURAPREP 26ML APPLICATOR (WOUND CARE) ×4 IMPLANT
ELECT CAUTERY BLADE 6.4 (BLADE) ×2 IMPLANT
ELECT REM PT RETURN 9FT ADLT (ELECTROSURGICAL) ×2
ELECTRODE REM PT RTRN 9FT ADLT (ELECTROSURGICAL) ×1 IMPLANT
EX-PIN ORTHOLOCK NAV 4X150 (PIN) ×4 IMPLANT
GLOVE SURG ENC MOIS LTX SZ7.5 (GLOVE) ×4 IMPLANT
GLOVE SURG ENC TEXT LTX SZ7.5 (GLOVE) ×4 IMPLANT
GLOVE SURG UNDER LTX SZ8 (GLOVE) ×2 IMPLANT
GLOVE SURG UNDER POLY LF SZ7.5 (GLOVE) ×2 IMPLANT
GOWN STRL REUS W/ TWL LRG LVL3 (GOWN DISPOSABLE) ×2 IMPLANT
GOWN STRL REUS W/ TWL XL LVL3 (GOWN DISPOSABLE) ×1 IMPLANT
GOWN STRL REUS W/TWL LRG LVL3 (GOWN DISPOSABLE) ×2
GOWN STRL REUS W/TWL XL LVL3 (GOWN DISPOSABLE) ×1
HEMOVAC 400CC 10FR (MISCELLANEOUS) ×2 IMPLANT
HOLDER FOLEY CATH W/STRAP (MISCELLANEOUS) ×2 IMPLANT
HOOD PEEL AWAY FLYTE STAYCOOL (MISCELLANEOUS) ×4 IMPLANT
IRRIGATION SURGIPHOR STRL (IV SOLUTION) ×2 IMPLANT
IV NS IRRIG 3000ML ARTHROMATIC (IV SOLUTION) ×2 IMPLANT
KIT TURNOVER KIT A (KITS) ×2 IMPLANT
KNIFE SCULPS 14X20 (INSTRUMENTS) ×2 IMPLANT
LABEL OR SOLS (LABEL) ×2 IMPLANT
MANIFOLD NEPTUNE II (INSTRUMENTS) ×4 IMPLANT
NDL SAFETY ECLIPSE 18X1.5 (NEEDLE) ×1 IMPLANT
NDL SPNL 20GX3.5 QUINCKE YW (NEEDLE) ×2 IMPLANT
NEEDLE HYPO 18GX1.5 SHARP (NEEDLE) ×1
NEEDLE SPNL 20GX3.5 QUINCKE YW (NEEDLE) ×4 IMPLANT
NS IRRIG 500ML POUR BTL (IV SOLUTION) ×2 IMPLANT
PACK TOTAL KNEE (MISCELLANEOUS) ×2 IMPLANT
PAD WRAPON POLAR KNEE (MISCELLANEOUS) ×1 IMPLANT
PENCIL SMOKE EVACUATOR COATED (MISCELLANEOUS) ×2 IMPLANT
PIN DRILL QUICK PACK ×2 IMPLANT
PIN FIXATION 1/8DIA X 3INL (PIN) ×6 IMPLANT
PULSAVAC PLUS IRRIG FAN TIP (DISPOSABLE) ×2
SOL PREP PVP 2OZ (MISCELLANEOUS) ×2
SOLUTION PREP PVP 2OZ (MISCELLANEOUS) ×1 IMPLANT
SPONGE DRAIN TRACH 4X4 STRL 2S (GAUZE/BANDAGES/DRESSINGS) ×2 IMPLANT
SPONGE LAP 18X18 RF (DISPOSABLE) ×1 IMPLANT
STAPLER SKIN PROX 35W (STAPLE) ×2 IMPLANT
STOCKINETTE IMPERV 14X48 (MISCELLANEOUS) ×1 IMPLANT
STRAP TIBIA SHORT (MISCELLANEOUS) ×2 IMPLANT
SUCTION FRAZIER HANDLE 10FR (MISCELLANEOUS) ×1
SUCTION TUBE FRAZIER 10FR DISP (MISCELLANEOUS) ×1 IMPLANT
SUT VIC AB 0 CT1 36 (SUTURE) ×4 IMPLANT
SUT VIC AB 1 CT1 36 (SUTURE) ×4 IMPLANT
SUT VIC AB 2-0 CT2 27 (SUTURE) ×2 IMPLANT
SYR 20ML LL LF (SYRINGE) ×2 IMPLANT
SYR 30ML LL (SYRINGE) ×4 IMPLANT
TIBIAL BASE ROT PLAT SZ 8 KNEE (Knees) ×2 IMPLANT
TIP FAN IRRIG PULSAVAC PLUS (DISPOSABLE) ×1 IMPLANT
TOWEL OR 17X26 4PK STRL BLUE (TOWEL DISPOSABLE) ×2 IMPLANT
TOWER CARTRIDGE SMART MIX (DISPOSABLE) ×2 IMPLANT
TRAY FOLEY MTR SLVR 16FR STAT (SET/KITS/TRAYS/PACK) ×2 IMPLANT
WRAPON POLAR PAD KNEE (MISCELLANEOUS) ×2

## 2020-04-16 NOTE — Evaluation (Signed)
Physical Therapy Evaluation Patient Details Name: Maxwell Stanley MRN: [id number] DOB: 08-07-1934 Today's Date: 04/16/2020   History of Present Illness  Pt is an 86 yo male diagnosed with degenerative arthrosis of the right knee and is s/p elective R TKA.  PMH includes: HTN, A-fib, DM, skin CA, CKD 3, and DDD.    Clinical Impression  Pt was pleasant and motivated to participate during the session and overall performed very well especially considering POD#0 status.  Pt required no physical assistance with any functional task and was steady with gait and transfers.  Pt reported only mild dizziness once he ambulated from the bed to the chair but it resolved very quickly upon sitting.  Pt is expected to make very good progress while in acute care and will benefit from HHPT upon discharge to safely address deficits listed in patient problem list for decreased caregiver assistance and eventual return to PLOF.     Follow Up Recommendations Home health PT;Supervision for mobility/OOB    Equipment Recommendations  Rolling walker with 5" wheels;Other (comment) (Pt has a high riser on his toilet and a counter right next to the toilet to lean on, will likely not require a BSC at discharge)    Recommendations for Other Services       Precautions / Restrictions Precautions Precautions: Fall Restrictions Weight Bearing Restrictions: Yes RLE Weight Bearing: Weight bearing as tolerated Other Position/Activity Restrictions: Pt able to perform Ind RLE SLR without extensor lag, no KI required      Mobility  Bed Mobility Overal bed mobility: Modified Independent             General bed mobility comments: Extra time and effort only    Transfers Overall transfer level: Needs assistance Equipment used: Rolling walker (2 wheeled) Transfers: Sit to/from Stand Sit to Stand: Min guard         General transfer comment: Mod verbal and visual cues for sequencing for hand and R foot  placement  Ambulation/Gait Ambulation/Gait assistance: Min guard Gait Distance (Feet): 8 Feet Assistive device: Rolling walker (2 wheeled) Gait Pattern/deviations: Step-to pattern;Antalgic;Decreased stance time - right Gait velocity: decreased   General Gait Details: Pt able to take several steps forwards/backwards and sideways at EOB and then from bed to recliner with good stability and with mod verbal and visual cues for sequencing  Stairs            Wheelchair Mobility    Modified Rankin (Stroke Patients Only)       Balance Overall balance assessment: Needs assistance   Sitting balance-Leahy Scale: Normal     Standing balance support: Bilateral upper extremity supported;During functional activity Standing balance-Leahy Scale: Good Standing balance comment: Min to mod lean on the RW for support but steady with amb without LOB                             Pertinent Vitals/Pain Pain Assessment: 0-10 Pain Score: 5  Pain Location: R knee Pain Descriptors / Indicators: Sore;Aching Pain Intervention(s): Premedicated before session;Monitored during session;Repositioned    Home Living Family/patient expects to be discharged to:: Private residence Living Arrangements: Spouse/significant other Available Help at Discharge: Family;Available 24 hours/day Type of Home: House Home Access: Stairs to enter Entrance Stairs-Rails: Right Entrance Stairs-Number of Steps: 5 Home Layout: One level Home Equipment: Walker - 4 wheels;Cane - single point Additional Comments: Pt reports toilet has a riser that is very high along with having a  counter directly to his right that he can lean on while standing    Prior Function Level of Independence: Independent         Comments: Ind amb community distances without an AD, no fall history, started using a hurricane-type SPC over the last few days secondary to R knee pain, Ind with ADLs     Hand Dominance         Extremity/Trunk Assessment   Upper Extremity Assessment Upper Extremity Assessment: Overall WFL for tasks assessed    Lower Extremity Assessment Lower Extremity Assessment: Generalized weakness;RLE deficits/detail RLE Deficits / Details: BLE ankle strength, AROM, and sensation to light touch WNL; R hip flex >/= 3/5 with pt able to perform SLR without extensor lag with good control RLE: Unable to fully assess due to pain;Unable to fully assess due to immobilization RLE Sensation: WNL RLE Coordination: WNL       Communication   Communication: No difficulties  Cognition Arousal/Alertness: Awake/alert Behavior During Therapy: WFL for tasks assessed/performed Overall Cognitive Status: Within Functional Limits for tasks assessed                                        General Comments      Exercises Total Joint Exercises Ankle Circles/Pumps: AROM;Strengthening;Both;10 reps Quad Sets: AROM;Strengthening;Right;5 reps;10 reps Hip ABduction/ADduction: AROM;Strengthening;Right;5 reps Straight Leg Raises: AROM;Strengthening;Right;5 reps Long Arc Quad: AROM;Strengthening;Right;10 reps;5 reps Knee Flexion: AROM;Strengthening;Right;5 reps;10 reps Goniometric ROM: R knee AROM: 0-68 deg limited by wrapping Marching in Standing: AROM;Strengthening;Both;5 reps;Standing Other Exercises Other Exercises: HEP education per handout Other Exercises: Positioning education for R knee ext PROM and to prevent heel pressure   Assessment/Plan    PT Assessment Patient needs continued PT services  PT Problem List Decreased strength;Decreased range of motion;Decreased activity tolerance;Decreased balance;Decreased mobility;Decreased knowledge of use of DME;Pain       PT Treatment Interventions DME instruction;Gait training;Stair training;Functional mobility training;Therapeutic activities;Therapeutic exercise;Balance training;Patient/family education    PT Goals (Current goals can be  found in the Care Plan section)  Acute Rehab PT Goals Patient Stated Goal: "To be able to walk and especially go up and down steps better" PT Goal Formulation: With patient Time For Goal Achievement: 04/29/20 Potential to Achieve Goals: Good    Frequency BID   Barriers to discharge        Co-evaluation               AM-PAC PT "6 Clicks" Mobility  Outcome Measure Help needed turning from your back to your side while in a flat bed without using bedrails?: A Little Help needed moving from lying on your back to sitting on the side of a flat bed without using bedrails?: A Little Help needed moving to and from a bed to a chair (including a wheelchair)?: A Little Help needed standing up from a chair using your arms (e.g., wheelchair or bedside chair)?: A Little Help needed to walk in hospital room?: A Little Help needed climbing 3-5 steps with a railing? : A Little 6 Click Score: 18    End of Session Equipment Utilized During Treatment: Gait belt Activity Tolerance: Patient tolerated treatment well Patient left: in chair;with call bell/phone within reach;with chair alarm set;with SCD's reapplied;Other (comment);with family/visitor present (Polar care donned to R knee) Nurse Communication: Mobility status;Weight bearing status PT Visit Diagnosis: Muscle weakness (generalized) (M62.81);Other abnormalities of gait and mobility (R26.89);Pain Pain - Right/Left:  Right Pain - part of body: Knee    Time: 3838-1840 PT Time Calculation (min) (ACUTE ONLY): 41 min   Charges:   PT Evaluation $PT Eval Moderate Complexity: 1 Mod PT Treatments $Therapeutic Exercise: 8-22 mins $Therapeutic Activity: 8-22 mins        D. Scott Cerina Leary PT, DPT 04/16/20, 6:00 PM

## 2020-04-16 NOTE — Anesthesia Procedure Notes (Signed)
Date/Time: 04/16/2020 11:18 AM Performed by: Nelda Marseille, CRNA Pre-anesthesia Checklist: Patient identified, Emergency Drugs available, Suction available, Patient being monitored and Timeout performed Oxygen Delivery Method: Simple face mask

## 2020-04-16 NOTE — Transfer of Care (Signed)
Immediate Anesthesia Transfer of Care Note  Patient: Maxwell Stanley  Procedure(s) Performed: COMPUTER ASSISTED TOTAL KNEE ARTHROPLASTY (Right Knee)  Patient Location: PACU  Anesthesia Type:Spinal  Level of Consciousness: awake and sedated  Airway & Oxygen Therapy: Patient Spontanous Breathing and Patient connected to face mask oxygen  Post-op Assessment: Report given to RN and Post -op Vital signs reviewed and stable  Post vital signs: Reviewed and stable  Last Vitals:  Vitals Value Taken Time  BP 113/63 04/16/20 1500  Temp    Pulse 60 04/16/20 1500  Resp 14 04/16/20 1500  SpO2 100 % 04/16/20 1500  Vitals shown include unvalidated device data.  Last Pain:  Vitals:   04/16/20 0948  TempSrc: Temporal  PainSc: 0-No pain         Complications: No complications documented.

## 2020-04-16 NOTE — Anesthesia Procedure Notes (Signed)
Spinal  Patient location during procedure: OR Start time: 04/16/2020 11:10 AM End time: 04/16/2020 11:15 AM Reason for block: surgical anesthesia Staffing Performed: resident/CRNA  Resident/CRNA: Nelda Marseille, CRNA Preanesthetic Checklist Completed: patient identified, IV checked, site marked, risks and benefits discussed, surgical consent, monitors and equipment checked, pre-op evaluation and timeout performed Spinal Block Patient position: sitting Prep: Betadine Patient monitoring: heart rate, continuous pulse ox, blood pressure and cardiac monitor Approach: midline Location: L3-4 Injection technique: single-shot Needle Needle type: Whitacre and Introducer  Needle gauge: 25 G Needle length: 9 cm Assessment Sensory level: T10 Events: CSF return Additional Notes Negative paresthesia. Negative blood return. Positive free-flowing CSF. Expiration date of kit checked and confirmed. Patient tolerated procedure well, without complications.

## 2020-04-16 NOTE — Op Note (Signed)
OPERATIVE NOTE  DATE OF SURGERY:  04/16/2020  PATIENT NAME:  Maxwell Stanley   DOB: Feb 28, 1934  MRN: [id number]  PRE-OPERATIVE DIAGNOSIS: Degenerative arthrosis of the right knee, primary  POST-OPERATIVE DIAGNOSIS:  Same  PROCEDURE:  Right total knee arthroplasty using computer-assisted navigation  SURGEON:  Marciano Sequin. M.D.  ASSISTANT: Cassell Smiles, PA-C (present and scrubbed throughout the case, critical for assistance with exposure, retraction, instrumentation, and closure)  ANESTHESIA: spinal  ESTIMATED BLOOD LOSS: 50 mL  FLUIDS REPLACED: 1000 mL of crystalloid  TOURNIQUET TIME: 107 minutes  DRAINS: 2 medium Hemovac drains  SOFT TISSUE RELEASES: Anterior cruciate ligament, posterior cruciate ligament, deep medial collateral ligament, patellofemoral ligament  IMPLANTS UTILIZED: DePuy Attune size 8 posterior stabilized femoral component (cemented), size 8 rotating platform tibial component (cemented), 41 mm medialized dome patella (cemented), and a 6 mm stabilized rotating platform polyethylene insert.  INDICATIONS FOR SURGERY: Maxwell Stanley is a 86 y.o. 86 year old male with a long history of progressive knee pain. X-rays demonstrated severe degenerative changes in tricompartmental fashion. The patient had not seen any significant improvement despite conservative nonsurgical intervention. After discussion of the risks and benefits of surgical intervention, the patient expressed understanding of the risks benefits and agree with plans for total knee arthroplasty.   The risks, benefits, and alternatives were discussed at length including but not limited to the risks of infection, bleeding, nerve injury, stiffness, blood clots, the need for revision surgery, cardiopulmonary complications, among others, and they were willing to proceed.  PROCEDURE IN DETAIL: The patient was brought into the operating room and, after adequate spinal anesthesia was achieved, a tourniquet was  placed on the patient's upper thigh. The patient's knee and leg were cleaned and prepped with alcohol and DuraPrep and draped in the usual sterile fashion. A "timeout" was performed as per usual protocol. The lower extremity was exsanguinated using an Esmarch, and the tourniquet was inflated to 300 mmHg. An anterior longitudinal incision was made followed by a standard mid vastus approach. The deep fibers of the medial collateral ligament were elevated in a subperiosteal fashion off of the medial flare of the tibia so as to maintain a continuous soft tissue sleeve. The patella was subluxed laterally and the patellofemoral ligament was incised. Inspection of the knee demonstrated severe degenerative changes with full-thickness loss of articular cartilage. Osteophytes were debrided using a rongeur. Anterior and posterior cruciate ligaments were excised. Two 4.0 mm Schanz pins were inserted in the femur and into the tibia for attachment of the array of trackers used for computer-assisted navigation. Hip center was identified using a circumduction technique. Distal landmarks were mapped using the computer. The distal femur and proximal tibia were mapped using the computer. The distal femoral cutting guide was positioned using computer-assisted navigation so as to achieve a 5 distal valgus cut. The femur was sized and it was felt that a size 8 femoral component was appropriate. A size 8 femoral cutting guide was positioned and the anterior cut was performed and verified using the computer. This was followed by completion of the posterior and chamfer cuts. Femoral cutting guide for the central box was then positioned in the center box cut was performed.  Attention was then directed to the proximal tibia. Medial and lateral menisci were excised. The extramedullary tibial cutting guide was positioned using computer-assisted navigation so as to achieve a 0 varus-valgus alignment and 3 posterior slope. The cut was  performed and verified using the computer. The proximal tibia was  sized and it was felt that a size 8 tibial tray was appropriate. Tibial and femoral trials were inserted followed by insertion of a 6 mm polyethylene insert. This allowed for excellent mediolateral soft tissue balancing both in flexion and in full extension. Finally, the patella was cut and prepared so as to accommodate a 41 mm medialized dome patella. A patella trial was placed and the knee was placed through a range of motion with excellent patellar tracking appreciated. The femoral trial was removed after debridement of posterior osteophytes. The central post-hole for the tibial component was reamed followed by insertion of a keel punch. Tibial trials were then removed. Cut surfaces of bone were irrigated with copious amounts of normal saline using pulsatile lavage and then suctioned dry. Polymethylmethacrylate cement with gentamicin was prepared in the usual fashion using a vacuum mixer. Cement was applied to the cut surface of the proximal tibia as well as along the undersurface of a size 8 rotating platform tibial component. Tibial component was positioned and impacted into place. Excess cement was removed using [employer] fast streamer. Cement was then applied to the cut surfaces of the femur as well as along the posterior flanges of the size 8 femoral component. The femoral component was positioned and impacted into place. Excess cement was removed using [employer] fast streamer. A 6 mm polyethylene trial was inserted and the knee was brought into full extension with steady axial compression applied. Finally, cement was applied to the backside of a 41 mm medialized dome patella and the patellar component was positioned and patellar clamp applied. Excess cement was removed using [employer] fast streamer. After adequate curing of the cement, the tourniquet was deflated after a total tourniquet time of 107 minutes. Hemostasis was achieved using electrocautery. The knee was  irrigated with copious amounts of normal saline using pulsatile lavage followed by 500 ml of Surgiphor and then suctioned dry. 20 mL of 1.3% Exparel and 60 mL of 0.25% Marcaine in 40 mL of normal saline was injected along the posterior capsule, medial and lateral gutters, and along the arthrotomy site. A 6 mm stabilized rotating platform polyethylene insert was inserted and the knee was placed through a range of motion with excellent mediolateral soft tissue balancing appreciated and excellent patellar tracking noted. 2 medium drains were placed in the wound bed and brought out through separate stab incisions. The medial parapatellar portion of the incision was reapproximated using interrupted sutures of #1 Vicryl. Subcutaneous tissue was approximated in layers using first #0 Vicryl followed #2-0 Vicryl. The skin was approximated with skin staples. A sterile dressing was applied.  The patient tolerated the procedure well and was transported to the recovery room in stable condition.    Daylah Sayavong P. Holley Bouche., M.D.

## 2020-04-16 NOTE — Progress Notes (Signed)
Reported elevated BG to on-call. Tamala Julian, PA ordered 6 units Novolog. Reported order to charge nurse.

## 2020-04-16 NOTE — H&P (Signed)
The patient has been re-examined, and the chart reviewed, and there have been no interval changes to the documented history and physical.    The risks, benefits, and alternatives have been discussed at length. The patient expressed understanding of the risks benefits and agreed with plans for surgical intervention.  Evin Chirco P. Lorraine Cimmino, Jr. M.D.    

## 2020-04-16 NOTE — Anesthesia Preprocedure Evaluation (Signed)
Anesthesia Evaluation  Patient identified by MRN, date of birth, ID band Patient awake    Reviewed: Allergy & Precautions, NPO status , Patient's Chart, lab work & pertinent test results  History of Anesthesia Complications (+) PONV and history of anesthetic complications  Airway Mallampati: II  TM Distance: >3 FB Neck ROM: Full    Dental  (+) Poor Dentition   Pulmonary neg pulmonary ROS, neg sleep apnea, neg COPD,    breath sounds clear to auscultation- rhonchi (-) wheezing      Cardiovascular hypertension, Pt. on medications (-) CAD, (-) Past MI, (-) Cardiac Stents and (-) CABG + dysrhythmias Atrial Fibrillation  Rhythm:Regular Rate:Normal - Systolic murmurs and - Diastolic murmurs    Neuro/Psych neg Seizures negative neurological ROS  negative psych ROS   GI/Hepatic Neg liver ROS, GERD  ,  Endo/Other  diabetes, Oral Hypoglycemic Agents  Renal/GU Renal InsufficiencyRenal disease     Musculoskeletal  (+) Arthritis ,   Abdominal (+) - obese,   Peds  Hematology negative hematology ROS (+)   Anesthesia Other Findings Past Medical History: No date: A-fib [facility]) unknown: Allergy 11/28/2019: Basal cell carcinoma     Comment:  R neck post auricular, EDC  NODULAR AND INFILTRATIVE               PATTERNS 12/21/2019: Basal cell carcinoma     Comment:  R lateral calf, treated wiht EDC 01/26/2020 No date: BPH (benign prostatic hypertrophy)     Comment:  with BOO No date: Cancer ([place])     Comment:  skin cancers No date: Carpal tunnel syndrome on left nov.2020: Cataract No date: CKD stage 3 secondary to diabetes ([place]) No date: Complication of anesthesia No date: DDD (degenerative disc disease), lumbar No date: Degenerative arthritis of finger 2017: Diabetes mellitus without complication ([place]) 02/02/7865: Foot pain, left No date: GERD (gastroesophageal reflux disease) 2021: History of squamous cell carcinoma of skin      Comment:  left ear/Moh's No date: Hyperlipidemia No date: Hypertension No date: Nocturia 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 No date: PONV (postoperative nausea and vomiting) No date: Primary osteoarthritis of both knees No date: Primary osteoarthritis of right hip 11/28/2019: Squamous cell carcinoma of skin     Comment:  L ear sup helix  WELL DIFFERENTIATED No date: Type 2 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) No date: Urinary frequency   Reproductive/Obstetrics                             Lab Results  Component Value Date   WBC 5.4 04/04/2020   HGB 11.8 (L) 04/04/2020   HCT 34.3 (L) 04/04/2020   MCV 94.0 04/04/2020   PLT 196 04/04/2020    Anesthesia Physical Anesthesia Plan  ASA: III  Anesthesia Plan: Spinal   Post-op Pain Management:    Induction:   PONV Risk Score and Plan: 2 and Propofol infusion  Airway Management Planned: Natural Airway  Additional Equipment:   Intra-op Plan:   Post-operative Plan:   Informed Consent: I have reviewed the patients History and Physical, chart, labs and discussed the procedure including the risks, benefits and alternatives for the proposed anesthesia with the patient or authorized representative who has indicated his/her understanding and acceptance.     Dental advisory given  Plan Discussed with: CRNA and Anesthesiologist  Anesthesia Plan Comments:         Anesthesia Quick Evaluation

## 2020-04-17 ENCOUNTER — Encounter: Payer: Self-pay | Admitting: Orthopedic Surgery

## 2020-04-17 DIAGNOSIS — Z7901 Long term (current) use of anticoagulants: Secondary | ICD-10-CM | POA: Diagnosis not present

## 2020-04-17 DIAGNOSIS — M25561 Pain in right knee: Secondary | ICD-10-CM | POA: Diagnosis not present

## 2020-04-17 DIAGNOSIS — N183 Chronic kidney disease, stage 3 unspecified: Secondary | ICD-10-CM | POA: Diagnosis not present

## 2020-04-17 DIAGNOSIS — Z85828 Personal history of other malignant neoplasm of skin: Secondary | ICD-10-CM | POA: Diagnosis not present

## 2020-04-17 DIAGNOSIS — R531 Weakness: Secondary | ICD-10-CM | POA: Diagnosis not present

## 2020-04-17 DIAGNOSIS — I129 Hypertensive chronic kidney disease with stage 1 through stage 4 chronic kidney disease, or unspecified chronic kidney disease: Secondary | ICD-10-CM | POA: Diagnosis not present

## 2020-04-17 DIAGNOSIS — Z79899 Other long term (current) drug therapy: Secondary | ICD-10-CM | POA: Diagnosis not present

## 2020-04-17 DIAGNOSIS — Z7984 Long term (current) use of oral hypoglycemic drugs: Secondary | ICD-10-CM | POA: Diagnosis not present

## 2020-04-17 DIAGNOSIS — M1711 Unilateral primary osteoarthritis, right knee: Secondary | ICD-10-CM | POA: Diagnosis not present

## 2020-04-17 DIAGNOSIS — E1122 Type 2 diabetes mellitus with diabetic chronic kidney disease: Secondary | ICD-10-CM | POA: Diagnosis not present

## 2020-04-17 LAB — GLUCOSE, CAPILLARY
Glucose-Capillary: 161 mg/dL — ABNORMAL HIGH (ref 70–99)
Glucose-Capillary: 192 mg/dL — ABNORMAL HIGH (ref 70–99)
Glucose-Capillary: 215 mg/dL — ABNORMAL HIGH (ref 70–99)

## 2020-04-17 MED ORDER — TRAMADOL HCL 50 MG PO TABS: 50.0000 mg | ORAL_TABLET | ORAL | 0 refills | Status: DC | PRN

## 2020-04-17 NOTE — TOC Progression Note (Signed)
Transition of Care [facility]) - Progression Note    Patient Details  Name: Maxwell Stanley MRN: [id number] Date of Birth: Jun 02, 1934  Transition of Care [facility]) CM/SW [place], RN Phone Number: 04/17/2020, 2:59 PM  Clinical Narrative:   Spoke with the patient and his spouse they need a 3 in 1 and rw, Notified [employer], they will bring to the room, I thanked the patient for their patience, they stated they were very happy with their service         Expected Discharge Plan and Services           Expected Discharge Date: 04/17/20                                     Social Determinants of Health (SDOH) Interventions    Readmission Risk Interventions No flowsheet data found.

## 2020-04-17 NOTE — Progress Notes (Signed)
  Subjective: 1 Day Post-Op Procedure(s) (LRB): COMPUTER ASSISTED TOTAL KNEE ARTHROPLASTY (Right) Patient reports pain as well-controlled.   Patient is well, and has had no acute complaints or problems Plan is to go Home after hospital stay. Negative for chest pain and shortness of breath Fever: no Gastrointestinal: negative for nausea and vomiting.   Patient has had a bowel movement.  Objective: Vital signs in last 24 hours: Temp:  [97 F (36.1 C)-98.1 F (36.7 C)] 98 F (36.7 C) (04/12 0454) Pulse Rate:  [59-80] 80 (04/12 0454) Resp:  [13-18] 18 (04/12 0454) BP: (113-141)/(62-78) 133/73 (04/12 0454) SpO2:  [94 %-100 %] 96 % (04/12 0454)  Intake/Output from previous day:  Intake/Output Summary (Last 24 hours) at 04/17/2020 0807 Last data filed at 04/17/2020 0741 Gross per 24 hour  Intake 3237.78 ml  Output 1661 ml  Net 1576.78 ml    Intake/Output this shift: Total I/O In: -  Out: 30 [Drains:30]  Labs: No results for input(s): HGB in the last 72 hours. No results for input(s): WBC, RBC, HCT, PLT in the last 72 hours. No results for input(s): NA, K, CL, CO2, BUN, CREATININE, GLUCOSE, CALCIUM in the last 72 hours. No results for input(s): LABPT, INR in the last 72 hours.   EXAM General - Patient is Alert, Appropriate and Oriented Extremity - Neurovascular intact Dorsiflexion/Plantar flexion intact Compartment soft Dressing/Incision -Postoperative dressing remains in place., Polar Care in place and working. , Hemovac in place.  Motor Function - intact, moving foot and toes well on exam.  Cardiovascular- Regular rate and rhythm, no murmurs/rubs/gallops Respiratory- Lungs clear to auscultation bilaterally Gastrointestinal- soft, nontender and active bowel sounds   Assessment/Plan: 1 Day Post-Op Procedure(s) (LRB): COMPUTER ASSISTED TOTAL KNEE ARTHROPLASTY (Right) Active Problems:   Total knee replacement status  Estimated body mass index is 24.2 kg/m as  calculated from the following:   Height as of 04/04/20: 6\' 2"  (1.88 m).   Weight as of 04/04/20: 85.5 kg. Advance diet Up with therapy  Discharge later today pending completion of PT goals.   DVT Prophylaxis - Eloquis, TED hose, foot pumps  Weight-Bearing as tolerated to right leg  Cassell Smiles, PA-C [facility] Orthopaedic Surgery 04/17/2020, 8:07 AM

## 2020-04-17 NOTE — Progress Notes (Signed)
Physical Therapy Treatment Patient Details Name: Maxwell Stanley MRN: [id number] DOB: 10-26-1934 Today's Date: 04/17/2020    History of Present Illness Pt is an 86 yo male diagnosed with degenerative arthrosis of the right knee and is s/p elective R TKA.  PMH includes: HTN, A-fib, DM, skin CA, CKD 3, and DDD.    PT Comments    Pt was pleasant and motivated to participate during the session and made very good progress towards goals.  Pt was steady with transfers and gait with significant improvement in cadence and RLE stance time.  Pt demonstrated good eccentric and concentric control and stability during stair training along with good carryover of proper sequencing.  Pt reported no adverse symptoms during the session other than very minimal R knee pain upon coming to initial stand that quickly resolved.  Pt will benefit from HHPT upon discharge to safely address deficits listed in patient problem list for decreased caregiver assistance and eventual return to PLOF.     Follow Up Recommendations  Home health PT;Supervision for mobility/OOB     Equipment Recommendations  Rolling walker with 5" wheels;3in1 (PT)    Recommendations for Other Services       Precautions / Restrictions Precautions Precautions: Fall Restrictions Weight Bearing Restrictions: Yes RLE Weight Bearing: Weight bearing as tolerated Other Position/Activity Restrictions: Pt able to perform Ind RLE SLR without extensor lag, no KI required    Mobility  Bed Mobility               General bed mobility comments: NT, pt in recliner    Transfers Overall transfer level: Needs assistance Equipment used: Rolling walker (2 wheeled) Transfers: Sit to/from Stand Sit to Stand: Supervision         General transfer comment: Min verbal cues for sequencing for hand and R foot placement  Ambulation/Gait Ambulation/Gait assistance: Min guard;Supervision Gait Distance (Feet): 200 Feet Assistive device: Rolling  walker (2 wheeled) Gait Pattern/deviations: Decreased stance time - right;Step-through pattern Gait velocity: decreased   General Gait Details: Significantly improved cadence and RLE stance time with no instability noted   Stairs Stairs: Yes Stairs assistance: Min guard Stair Management: One rail Right Number of Stairs: 5 General stair comments: Pt steady ascending/descending 4 steps x 2 with one rail and good carryover of proper sequencing   Wheelchair Mobility    Modified Rankin (Stroke Patients Only)       Balance Overall balance assessment: Needs assistance   Sitting balance-Leahy Scale: Normal     Standing balance support: Bilateral upper extremity supported;During functional activity Standing balance-Leahy Scale: Good Standing balance comment: Very light lean on the RW for support but steady with amb without LOB                            Cognition Arousal/Alertness: Awake/alert Behavior During Therapy: WFL for tasks assessed/performed Overall Cognitive Status: Within Functional Limits for tasks assessed                                        Exercises Total Joint Exercises Ankle Circles/Pumps: AROM;Strengthening;Both;10 reps Quad Sets: AROM;Strengthening;Right;10 reps;15 reps Long Arc Quad: AROM;Strengthening;Right;10 reps;15 reps Knee Flexion: AROM;Strengthening;Right;10 reps;15 reps Goniometric ROM: R knee AROM: 2-93 deg Marching in Standing: AROM;Strengthening;Both;5 reps;Standing Other Exercises Other Exercises: 90 deg right turn training to prevent CKC twisting on the R knee Other Exercises:  Car transfer sequencing training with verbal instruction and visual simulated demonstration Other Exercises: Positioning education/review for R knee ext PROM and heel pressure relief    General Comments        Pertinent Vitals/Pain Pain Assessment: No/denies pain    Home Living                      Prior Function             PT Goals (current goals can now be found in the care plan section) Progress towards PT goals: Progressing toward goals    Frequency    BID      PT Plan Current plan remains appropriate    Co-evaluation              AM-PAC PT "6 Clicks" Mobility   Outcome Measure  Help needed turning from your back to your side while in a flat bed without using bedrails?: A Little Help needed moving from lying on your back to sitting on the side of a flat bed without using bedrails?: A Little Help needed moving to and from a bed to a chair (including a wheelchair)?: A Little Help needed standing up from a chair using your arms (e.g., wheelchair or bedside chair)?: A Little Help needed to walk in hospital room?: A Little Help needed climbing 3-5 steps with a railing? : A Little 6 Click Score: 18    End of Session Equipment Utilized During Treatment: Gait belt Activity Tolerance: Patient tolerated treatment well Patient left: Other (comment) (Pt left in BR for BM, nursing notified and pt educated to pull cord when ready) Nurse Communication: Mobility status;Weight bearing status PT Visit Diagnosis: Muscle weakness (generalized) (M62.81);Other abnormalities of gait and mobility (R26.89);Pain Pain - Right/Left: Right Pain - part of body: Knee     Time: 1005-1045 PT Time Calculation (min) (ACUTE ONLY): 40 min  Charges:  $Gait Training: 8-22 mins $Therapeutic Exercise: 8-22 mins $Therapeutic Activity: 8-22 mins                     D. Scott Dari Carpenito PT, DPT 04/17/20, 10:58 AM

## 2020-04-17 NOTE — Plan of Care (Signed)
Ready for discahrge. States understanding

## 2020-04-17 NOTE — Anesthesia Postprocedure Evaluation (Signed)
Anesthesia Post Note  Patient: Maxwell Stanley  Procedure(s) Performed: COMPUTER ASSISTED TOTAL KNEE ARTHROPLASTY (Right Knee)  Patient location during evaluation: Nursing Unit Anesthesia Type: Spinal Level of consciousness: oriented and awake and alert Pain management: pain level controlled Vital Signs Assessment: post-procedure vital signs reviewed and stable Respiratory status: spontaneous breathing, respiratory function stable and patient connected to nasal cannula oxygen Cardiovascular status: blood pressure returned to baseline and stable Postop Assessment: no headache, no backache and no apparent nausea or vomiting Anesthetic complications: no   No complications documented.   Last Vitals:  Vitals:   04/17/20 0454 04/17/20 0831  BP: 133/73 138/71  Pulse: 80 73  Resp: 18 16  Temp: 36.7 C (!) 36.4 C  SpO2: 96% 98%    Last Pain:  Vitals:   04/17/20 0050  TempSrc:   PainSc: 0-No pain                 Florence Grosser,  Clearnce Sorrel

## 2020-04-17 NOTE — Evaluation (Signed)
Occupational Therapy Evaluation Patient Details Name: CHRISTOPH COPELAN MRN: [id number] DOB: 1934-11-01 Today's Date: 04/17/2020    History of Present Illness Pt is an 86 yo male diagnosed with degenerative arthrosis of the right knee and is s/p elective R TKA.  PMH includes: HTN, A-fib, DM, skin CA, CKD 3, and DDD.   Clinical Impression   Pt seen for OT evaluation this date, POD#1 from above surgery. Pt was independent in all ADLs prior to surgery, however occasionally using a SPC for functional mobility due to R knee pain. Pt is eager to return to PLOF with less pain and improved safety and independence. Pt currently presents with decreased balance and requires MIN GUARD for toilet transfers and SUPERVISION for standing UB dressing, standing grooming, and functional mobility of short household distances with RW. Pt instructed in polar care mgt, falls prevention strategies, home/routines modifications, DME/AE for LB bathing and dressing tasks, and compression stocking mgt. Pt would benefit from additional skilled OT services including additional instruction in techniques for dressing and bathing skills, with or without assistive devices, to support recall and carryover prior to discharge and ultimately to maximize safety, independence, and minimize falls risk and caregiver burden. Upon discharge, recommend home health OT and supervision/assistance from family as needed.      Follow Up Recommendations  Home health OT;Supervision - Intermittent    Equipment Recommendations  3 in 1 bedside commode       Precautions / Restrictions Precautions Precautions: Fall Restrictions Weight Bearing Restrictions: Yes RLE Weight Bearing: Weight bearing as tolerated Other Position/Activity Restrictions: Pt able to perform Ind RLE SLR without extensor lag, no KI required      Mobility Bed Mobility Overal bed mobility: Modified Independent                Transfers Overall transfer level: Needs  assistance Equipment used: Rolling walker (2 wheeled) Transfers: Sit to/from Stand Sit to Stand: Supervision         General transfer comment: Safe hand placement. MIN verbal cues for R foot placement    Balance Overall balance assessment: Needs assistance   Sitting balance-Leahy Scale: Normal     Standing balance support: No upper extremity supported;During functional activity Standing balance-Leahy Scale: Good Standing balance comment: Good standing balance while standing to perform UB dressing                           ADL either performed or assessed with clinical judgement   ADL Overall ADL's : Needs assistance/impaired     Grooming: Wash/dry hands;Supervision/safety;Standing           Upper Body Dressing : Supervision/safety;Set up;Standing Upper Body Dressing Details (indicate cue type and reason): to don robe     Toilet Transfer: Min guard;Ambulation;BSC;RW           Functional mobility during ADLs: Supervision/safety;Rolling walker                    Pertinent Vitals/Pain Pain Assessment: No/denies pain        Extremity/Trunk Assessment Upper Extremity Assessment Upper Extremity Assessment: Overall WFL for tasks assessed   Lower Extremity Assessment Lower Extremity Assessment: Defer to PT evaluation       Communication Communication Communication: No difficulties   Cognition Arousal/Alertness: Awake/alert Behavior During Therapy: WFL for tasks assessed/performed Overall Cognitive Status: Within Functional Limits for tasks assessed  Exercises  Other Exercises: Pt instructed in polar care mgt, falls prevention strategies, home/routines modifications, DME/AE for LB bathing and dressing tasks, and compression stocking mgt; handout provided         Home Living Family/patient expects to be discharged to:: Private residence Living Arrangements: Spouse/significant  other Available Help at Discharge: Family;Available 24 hours/day Type of Home: House Home Access: Stairs to enter [employer] of Steps: 5 Entrance Stairs-Rails: Right Home Layout: One level     Bathroom Shower/Tub: Occupational psychologist: Handicapped height     Home Equipment: Environmental consultant - 4 wheels;Cane - single point;Shower seat;Grab bars - tub/shower;Toilet riser          Prior Functioning/Environment Level of Independence: Independent        Comments: Ind amb community distances without an AD, no fall history, started using a hurricane-type SPC over the last few days secondary to R knee pain, Ind with ADLs        OT Problem List: Decreased strength;Decreased activity tolerance;Impaired balance (sitting and/or standing)      OT Treatment/Interventions: Self-care/ADL training;Therapeutic exercise;DME and/or AE instruction;Therapeutic activities;Balance training;Patient/family education    OT Goals(Current goals can be found in the care plan section) Acute Rehab OT Goals Patient Stated Goal: To go home OT Goal Formulation: With patient Time For Goal Achievement: 05/01/20 Potential to Achieve Goals: Good ADL Goals Pt Will Perform Grooming: with modified independence;standing Pt Will Perform Lower Body Dressing: with modified independence;with adaptive equipment;sit to/from stand Pt Will Transfer to Toilet: with modified independence;ambulating;bedside commode  OT Frequency: Min 1X/week    AM-PAC OT "6 Clicks" Daily Activity     Outcome Measure Help from another person eating meals?: None Help from another person taking care of personal grooming?: A Little Help from another person toileting, which includes using toliet, bedpan, or urinal?: A Little Help from another person bathing (including washing, rinsing, drying)?: A Little Help from another person to put on and taking off regular upper body clothing?: A Little Help from another person to put on  and taking off regular lower body clothing?: A Lot 6 Click Score: 18   End of Session Equipment Utilized During Treatment: Rolling walker Nurse Communication: Mobility status  Activity Tolerance: Patient tolerated treatment well Patient left: in chair;with call bell/phone within reach;with chair alarm set  OT Visit Diagnosis: Unsteadiness on feet (R26.81)                Time: 6283-6629 OT Time Calculation (min): 34 min Charges:  OT General Charges $OT Visit: 1 Visit OT Evaluation $OT Eval Moderate Complexity: 1 Mod OT Treatments $Self Care/Home Management : 23-37 mins  Fredirick Maudlin, OTR/L [place]

## 2020-04-17 NOTE — Discharge Summary (Signed)
Physician Discharge Summary  Patient ID: Maxwell Stanley MRN: [id number] DOB/AGE: 1934/04/14 86 y.o.  Admit date: 04/16/2020 Discharge date: 04/17/2020  Admission Diagnoses:  Total knee replacement status [Z96.659]  Surgeries:Procedure(s):  Right total knee arthroplasty using computer-assisted navigation  SURGEON:  Marciano Sequin. M.D.  ASSISTANT: Cassell Smiles, PA-C (present and scrubbed throughout the case, critical for assistance with exposure, retraction, instrumentation, and closure)  ANESTHESIA: spinal  ESTIMATED BLOOD LOSS: 50 mL  FLUIDS REPLACED: 1000 mL of crystalloid  TOURNIQUET TIME: 107 minutes  DRAINS: 2 medium Hemovac drains  SOFT TISSUE RELEASES: Anterior cruciate ligament, posterior cruciate ligament, deep medial collateral ligament, patellofemoral ligament  IMPLANTS UTILIZED: DePuy Attune size 8 posterior stabilized femoral component (cemented), size 8 rotating platform tibial component (cemented), 41 mm medialized dome patella (cemented), and a 6 mm stabilized rotating platform polyethylene insert. Discharge Diagnoses: Patient Active Problem List   Diagnosis Date Noted  . Total knee replacement status 04/16/2020  . Acquired thrombophilia ([place]) 02/15/2020  . Primary osteoarthritis of both knees 05/19/2019  . Gastroesophageal reflux disease with esophagitis 02/02/2019  . Personal history of other malignant neoplasm of skin 12/17/2018  . Persistent atrial fibrillation ([place]) 10/29/2018  . Reducible right inguinal hernia 09/16/2018  . DDD (degenerative disc disease), lumbar 12/23/2017  . Primary osteoarthritis of right hip 12/23/2017  . Onychomycosis of toenail 08/19/2017  . Allergic rhinitis 08/08/2016  . Carpal tunnel syndrome on left 12/12/2015  . Chronic maxillary sinusitis 04/09/2015  . Type II diabetes mellitus with complication ([place]) 08/65/7846  . Chronic low back pain 09/21/2014  . Abnormal prostate specific antigen 04/17/2014  .  Enlarged prostate with lower urinary tract symptoms (LUTS) 04/17/2014  . CKD stage 3 secondary to diabetes ([place]) 04/17/2014  . Hyperlipidemia associated with type 2 diabetes mellitus ([place]) 04/17/2014  . 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 Essential (primary) hypertension 04/17/2014  . Back muscle spasm 04/17/2014  . Degenerative arthritis of finger 04/17/2014  . Breast development in males 04/17/2014  . 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 EDEMA 08/24/2009    Past Medical History:  Diagnosis Date  . A-fib ([place])   . Allergy unknown  . Basal cell carcinoma 11/28/2019   R neck post auricular, EDC  NODULAR AND INFILTRATIVE PATTERNS  . Basal cell carcinoma 12/21/2019   R lateral calf, treated wiht EDC 01/26/2020  . BPH (benign prostatic hypertrophy)    with BOO  . Cancer ([place])    skin cancers  . Carpal tunnel syndrome on left   . Cataract nov.2020  . CKD stage 3 secondary to diabetes ([place])   . Complication of anesthesia   . DDD (degenerative disc disease), lumbar   . Degenerative arthritis of finger   . Diabetes mellitus without complication ([place]) 9629  . Foot pain, left 04/17/2014  . GERD (gastroesophageal reflux disease)   . History of squamous cell carcinoma of skin 2021   left ear/Moh's  . Hyperlipidemia   . Hypertension   . Nocturia   . Organic impotence   . PONV (postoperative nausea and vomiting)   . Primary osteoarthritis of both knees   . Primary osteoarthritis of right hip   . Squamous cell carcinoma of skin 11/28/2019   L ear sup helix  WELL DIFFERENTIATED  . Type 2 DM with CKD stage 3 and hypertension ([place])   . Urinary frequency      Transfusion:    Consultants (if any):   Discharged Condition: Improved  Hospital Course: Maxwell Stanley  is an 86 y.o. male who was admitted 04/16/2020 with a diagnosis of right knee osteoarthritis and went to the operating room on 04/16/2020 and underwent right total knee arthroplasty. The patient received  perioperative antibiotics for prophylaxis (see below). The patient tolerated the procedure well and was transported to PACU in stable condition. After meeting PACU criteria, the patient was subsequently transferred to the Orthopaedics/Rehabilitation unit.   The patient received DVT prophylaxis in the form of early mobilization, Foot Pumps, TED hose and Eliquis. A sacral pad had been placed and heels were elevated off of the bed with rolled towels in order to protect skin integrity. Foley catheter was discontinued on postoperative day #0. Wound drains were discontinued on postoperative day #1. The surgical incision was healing well without signs of infection.  Physical therapy was initiated postoperatively for transfers, gait training, and strengthening. Occupational therapy was initiated for activities of daily living and evaluation for assisted devices. Rehabilitation goals were reviewed in detail with the patient. The patient made steady progress with physical therapy and physical therapy recommended discharge to Home.   The patient achieved the preliminary goals of this hospitalization and was felt to be medically and orthopaedically appropriate for discharge.  He was given perioperative antibiotics:  Anti-infectives (From admission, onward)   Start     Dose/Rate Route Frequency Ordered Stop   04/16/20 1700  ceFAZolin (ANCEF) IVPB 2g/100 mL premix        2 g 200 mL/hr over 30 Minutes Intravenous Every 6 hours 04/16/20 1546 04/16/20 2318   04/16/20 0600  ceFAZolin (ANCEF) IVPB 2g/100 mL premix        2 g 200 mL/hr over 30 Minutes Intravenous On call to O.R. 04/16/20 0216 04/16/20 1131    .  Recent vital signs:  Vitals:   04/17/20 0454 04/17/20 0831  BP: 133/73 138/71  Pulse: 80 73  Resp: 18 16  Temp: 98 F (36.7 C) (!) 97.5 F (36.4 C)  SpO2: 96% 98%    Recent laboratory studies:  No results for input(s): WBC, HGB, HCT, PLT, K, CL, CO2, BUN, CREATININE, GLUCOSE, CALCIUM, LABPT, INR  in the last 72 hours.  Diagnostic Studies: DG Knee Right Port  Result Date: 04/16/2020 CLINICAL DATA:  Status post right total knee replacement. EXAM: PORTABLE RIGHT KNEE - 1-2 VIEW COMPARISON:  None. FINDINGS: The right femoral and tibial components are well situated. Surgical drain is seen in the soft tissues anterior to the distal femur. Other expected postoperative changes are noted anteriorly as well. IMPRESSION: Status post right total knee arthroplasty. Electronically Signed   By: Marijo Conception M.D.   On: 04/16/2020 15:25    Discharge Medications:   Allergies as of 04/17/2020      Reactions   Codeine Other (See Comments)   Made his "Wild"   Ceftriaxone Rash   Penicillin G Rash   Did it involve swelling of the face/tongue/throat, SOB, or low BP? No Did it involve sudden or severe rash/hives, skin peeling, or any reaction on the inside of your mouth or nose? No Did you need to seek medical attention at a hospital or doctor's office? No When did it last happen? If all above answers are "NO", may proceed with cephalosporin use. IgE = 35 (WNL) on 01/18/2020      Medication List    TAKE these medications   acetaminophen 500 MG tablet Commonly known as: TYLENOL Take 1,000 mg by mouth daily.   amLODipine 2.5 MG tablet Commonly known as:  NORVASC Take 1 tablet (2.5 mg total) by mouth daily.   atenolol 50 MG tablet Commonly known as: TENORMIN TAKE 1 TABLET AT BEDTIME   celecoxib 200 MG capsule Commonly known as: CELEBREX Take 200 mg by mouth 2 (two) times daily.   cholecalciferol 25 MCG (1000 UNIT) tablet Commonly known as: VITAMIN D Take 1,000 Units by mouth daily.   clotrimazole 1 % cream Commonly known as: LOTRIMIN Apply 1 application topically 2 (two) times daily. What changed:   when to take this  reasons to take this   docusate sodium 100 MG capsule Commonly known as: COLACE Take 200 mg by mouth daily.   Eliquis 5 MG Tabs tablet Generic drug:  apixaban Take 1 tablet (5 mg total) by mouth 2 (two) times daily.   finasteride 5 MG tablet Commonly known as: PROSCAR Take 1 tablet (5 mg total) by mouth daily.   fluticasone 50 MCG/ACT nasal spray Commonly known as: FLONASE USE 2 SPRAYS IN EACH       NOSTRIL DAILY What changed:   when to take this  reasons to take this   hydrochlorothiazide 12.5 MG capsule Commonly known as: MICROZIDE Take 1 capsule (12.5 mg total) by mouth daily.   losartan 100 MG tablet Commonly known as: COZAAR Take 0.5 tablets (50 mg total) by mouth 2 (two) times daily.   Melatonin 10 MG Caps Take 10 mg by mouth at bedtime.   metFORMIN 500 MG 24 hr tablet Commonly known as: GLUCOPHAGE-XR Take 3 tablets (1,500 mg total) by mouth at bedtime.   multivitamin with minerals Tabs tablet Take 0.5 tablets by mouth in the morning and at bedtime.   ONE TOUCH ULTRA TEST test strip Generic drug: glucose blood Use to test blood sugar once daily.   OneTouch Delica Lancets 38B Misc 1 each by Does not apply route daily. Use to test blood sugar once daily.   Osteo Bi-Flex Triple Strength Tabs Take 1 tablet by mouth 2 (two) times daily.   pantoprazole 40 MG tablet Commonly known as: PROTONIX Take 1 tablet (40 mg total) by mouth daily.   PROBIOTIC DAILY PO Take 1 tablet by mouth daily.   rosuvastatin 20 MG tablet Commonly known as: CRESTOR Take 1 tablet (20 mg total) by mouth at bedtime.   sitaGLIPtin 100 MG tablet Commonly known as: Januvia Take 1 tablet (100 mg total) by mouth daily.   tamsulosin 0.4 MG Caps capsule Commonly known as: FLOMAX TAKE 1 CAPSULE DAILY AFTER BREAKFAST What changed:   how much to take  how to take this  when to take this  additional instructions   traMADol 50 MG tablet Commonly known as: ULTRAM Take 1 tablet (50 mg total) by mouth every 4 (four) hours as needed for moderate pain.   triamcinolone cream 0.1 % Commonly known as: KENALOG Apply 1 application  topically daily as needed for irritation.            Durable Medical Equipment  (From admission, onward)         Start     Ordered   04/16/20 1546  DME Walker rolling  Once       Question:  Patient needs a walker to treat with the following condition  Answer:  Total knee replacement status   04/16/20 1546   04/16/20 1546  DME Bedside commode  Once       Question:  Patient needs a bedside commode to treat with the following condition  Answer:  Total knee  replacement status   04/16/20 1546          Disposition: Home with home health PT     Follow-up Information    Watt Climes, [place] On 05/01/2020.   Specialty: Physician Assistant Why: at 2:15pm [facility] office) Contact information: [place] [address] [phone]        Dereck Leep, MD On 05/29/2020.   Specialty: Orthopedic Surgery Why: at 2:15pm Contact information: [place] [address] [place], PA-C 04/17/2020, 1:07 PM

## 2020-04-18 DIAGNOSIS — M1712 Unilateral primary osteoarthritis, left knee: Secondary | ICD-10-CM | POA: Diagnosis not present

## 2020-04-18 DIAGNOSIS — M1611 Unilateral primary osteoarthritis, right hip: Secondary | ICD-10-CM | POA: Diagnosis not present

## 2020-04-18 DIAGNOSIS — I4819 Other persistent atrial fibrillation: Secondary | ICD-10-CM | POA: Diagnosis not present

## 2020-04-18 DIAGNOSIS — E1122 Type 2 diabetes mellitus with diabetic chronic kidney disease: Secondary | ICD-10-CM | POA: Diagnosis not present

## 2020-04-18 DIAGNOSIS — K2100 Gastro-esophageal reflux disease with esophagitis, without bleeding: Secondary | ICD-10-CM | POA: Diagnosis not present

## 2020-04-18 DIAGNOSIS — Z471 Aftercare following joint replacement surgery: Secondary | ICD-10-CM | POA: Diagnosis not present

## 2020-04-18 DIAGNOSIS — N183 Chronic kidney disease, stage 3 unspecified: Secondary | ICD-10-CM | POA: Diagnosis not present

## 2020-04-18 DIAGNOSIS — I152 Hypertension secondary to endocrine disorders: Secondary | ICD-10-CM | POA: Diagnosis not present

## 2020-04-18 DIAGNOSIS — E1159 Type 2 diabetes mellitus with other circulatory complications: Secondary | ICD-10-CM | POA: Diagnosis not present

## 2020-04-18 DIAGNOSIS — M5136 Other intervertebral disc degeneration, lumbar region: Secondary | ICD-10-CM | POA: Diagnosis not present

## 2020-04-20 DIAGNOSIS — M1611 Unilateral primary osteoarthritis, right hip: Secondary | ICD-10-CM | POA: Diagnosis not present

## 2020-04-20 DIAGNOSIS — M5136 Other intervertebral disc degeneration, lumbar region: Secondary | ICD-10-CM | POA: Diagnosis not present

## 2020-04-20 DIAGNOSIS — K2100 Gastro-esophageal reflux disease with esophagitis, without bleeding: Secondary | ICD-10-CM | POA: Diagnosis not present

## 2020-04-20 DIAGNOSIS — N183 Chronic kidney disease, stage 3 unspecified: Secondary | ICD-10-CM | POA: Diagnosis not present

## 2020-04-20 DIAGNOSIS — E1159 Type 2 diabetes mellitus with other circulatory complications: Secondary | ICD-10-CM | POA: Diagnosis not present

## 2020-04-20 DIAGNOSIS — I4819 Other persistent atrial fibrillation: Secondary | ICD-10-CM | POA: Diagnosis not present

## 2020-04-20 DIAGNOSIS — I152 Hypertension secondary to endocrine disorders: Secondary | ICD-10-CM | POA: Diagnosis not present

## 2020-04-20 DIAGNOSIS — E1122 Type 2 diabetes mellitus with diabetic chronic kidney disease: Secondary | ICD-10-CM | POA: Diagnosis not present

## 2020-04-20 DIAGNOSIS — Z471 Aftercare following joint replacement surgery: Secondary | ICD-10-CM | POA: Diagnosis not present

## 2020-04-20 DIAGNOSIS — M1712 Unilateral primary osteoarthritis, left knee: Secondary | ICD-10-CM | POA: Diagnosis not present

## 2020-04-23 DIAGNOSIS — Z471 Aftercare following joint replacement surgery: Secondary | ICD-10-CM | POA: Diagnosis not present

## 2020-04-23 DIAGNOSIS — N183 Chronic kidney disease, stage 3 unspecified: Secondary | ICD-10-CM | POA: Diagnosis not present

## 2020-04-23 DIAGNOSIS — I4819 Other persistent atrial fibrillation: Secondary | ICD-10-CM | POA: Diagnosis not present

## 2020-04-23 DIAGNOSIS — E1159 Type 2 diabetes mellitus with other circulatory complications: Secondary | ICD-10-CM | POA: Diagnosis not present

## 2020-04-23 DIAGNOSIS — I152 Hypertension secondary to endocrine disorders: Secondary | ICD-10-CM | POA: Diagnosis not present

## 2020-04-23 DIAGNOSIS — M1611 Unilateral primary osteoarthritis, right hip: Secondary | ICD-10-CM | POA: Diagnosis not present

## 2020-04-23 DIAGNOSIS — M5136 Other intervertebral disc degeneration, lumbar region: Secondary | ICD-10-CM | POA: Diagnosis not present

## 2020-04-23 DIAGNOSIS — K2100 Gastro-esophageal reflux disease with esophagitis, without bleeding: Secondary | ICD-10-CM | POA: Diagnosis not present

## 2020-04-23 DIAGNOSIS — M1712 Unilateral primary osteoarthritis, left knee: Secondary | ICD-10-CM | POA: Diagnosis not present

## 2020-04-23 DIAGNOSIS — E1122 Type 2 diabetes mellitus with diabetic chronic kidney disease: Secondary | ICD-10-CM | POA: Diagnosis not present

## 2020-04-24 ENCOUNTER — Other Ambulatory Visit: Payer: Self-pay

## 2020-04-24 ENCOUNTER — Other Ambulatory Visit: Payer: Self-pay | Admitting: Internal Medicine

## 2020-04-24 DIAGNOSIS — E785 Hyperlipidemia, unspecified: Secondary | ICD-10-CM

## 2020-04-24 DIAGNOSIS — E1169 Type 2 diabetes mellitus with other specified complication: Secondary | ICD-10-CM

## 2020-04-24 DIAGNOSIS — I10 Essential (primary) hypertension: Secondary | ICD-10-CM

## 2020-04-24 MED ORDER — ATENOLOL 50 MG PO TABS: 50.0000 mg | ORAL_TABLET | Freq: Every day | ORAL | 1 refills | Status: DC

## 2020-04-24 NOTE — Telephone Encounter (Signed)
Requested Prescriptions  Pending Prescriptions Disp Refills  . rosuvastatin (CRESTOR) 20 MG tablet [Pharmacy Med Name: ROSUVASTATIN TAB 20MG ] 90 tablet 2    Sig: TAKE 1 TABLET AT BEDTIME     Cardiovascular:  Antilipid - Statins Failed - 04/24/2020 10:13 AM      Failed - LDL in normal range and within 360 days    LDL Chol Calc (NIH)  Date Value Ref Range Status  02/15/2020 56 0 - 99 mg/dL Final         Failed - HDL in normal range and within 360 days    HDL  Date Value Ref Range Status  02/15/2020 35 (L) >39 mg/dL Final         Failed - Triglycerides in normal range and within 360 days    Triglycerides  Date Value Ref Range Status  02/15/2020 195 (H) 0 - 149 mg/dL Final         Passed - Total Cholesterol in normal range and within 360 days    Cholesterol, Total  Date Value Ref Range Status  02/15/2020 123 100 - 199 mg/dL Final         Passed - Patient is not pregnant      Passed - Valid encounter within last 12 months    Recent Outpatient Visits          2 months ago Annual physical exam   [facility] Glean Hess, MD   6 months ago Type II diabetes mellitus with complication [facility])   [facility] Glean Hess, MD   9 months ago Type II diabetes mellitus with complication [facility])   [facility] Glean Hess, MD   11 months ago Type II diabetes mellitus with complication [facility])   [facility] Glean Hess, MD   1 year ago Annual physical exam   [facility] Glean Hess, MD      Future Appointments            In 1 month [name] Jesse Sans, MD [facility] [place]   In 3 months Ralene Bathe, MD [place]   In 10 months [name] Jesse Sans, MD [facility]           '

## 2020-04-25 ENCOUNTER — Other Ambulatory Visit: Payer: Self-pay

## 2020-04-25 DIAGNOSIS — M1712 Unilateral primary osteoarthritis, left knee: Secondary | ICD-10-CM | POA: Diagnosis not present

## 2020-04-25 DIAGNOSIS — I152 Hypertension secondary to endocrine disorders: Secondary | ICD-10-CM | POA: Diagnosis not present

## 2020-04-25 DIAGNOSIS — I10 Essential (primary) hypertension: Secondary | ICD-10-CM

## 2020-04-25 DIAGNOSIS — M5136 Other intervertebral disc degeneration, lumbar region: Secondary | ICD-10-CM | POA: Diagnosis not present

## 2020-04-25 DIAGNOSIS — E1159 Type 2 diabetes mellitus with other circulatory complications: Secondary | ICD-10-CM | POA: Diagnosis not present

## 2020-04-25 DIAGNOSIS — N183 Chronic kidney disease, stage 3 unspecified: Secondary | ICD-10-CM | POA: Diagnosis not present

## 2020-04-25 DIAGNOSIS — Z471 Aftercare following joint replacement surgery: Secondary | ICD-10-CM | POA: Diagnosis not present

## 2020-04-25 DIAGNOSIS — K2100 Gastro-esophageal reflux disease with esophagitis, without bleeding: Secondary | ICD-10-CM | POA: Diagnosis not present

## 2020-04-25 DIAGNOSIS — I4819 Other persistent atrial fibrillation: Secondary | ICD-10-CM | POA: Diagnosis not present

## 2020-04-25 DIAGNOSIS — M1611 Unilateral primary osteoarthritis, right hip: Secondary | ICD-10-CM | POA: Diagnosis not present

## 2020-04-25 DIAGNOSIS — E1122 Type 2 diabetes mellitus with diabetic chronic kidney disease: Secondary | ICD-10-CM | POA: Diagnosis not present

## 2020-04-25 MED ORDER — AMLODIPINE BESYLATE 2.5 MG PO TABS: 2.5000 mg | ORAL_TABLET | Freq: Every day | ORAL | 1 refills | Status: DC

## 2020-04-25 NOTE — Telephone Encounter (Signed)
Copied from [place] [phone]. Topic: Quick Communication - Rx Refill/Question >> Apr 25, 2020  9:46 AM Virl Axe D wrote: Medication: amLODipine (NORVASC) 2.5 MG tablet  Has the patient contacted their pharmacy? Yes.   (Agent: If no, request that the patient contact the pharmacy for the refill.) (Agent: If yes, when and what did the pharmacy advise?)Contact PCP  Preferred Pharmacy (with phone number or street name): [facility], [place] to Registered [employer] [facility]  Phone:  [phone] Fax:  [phone]     Agent: Please be advised that RX refills may take up to 3 business days. We ask that you follow-up with your pharmacy.

## 2020-04-27 DIAGNOSIS — K2100 Gastro-esophageal reflux disease with esophagitis, without bleeding: Secondary | ICD-10-CM | POA: Diagnosis not present

## 2020-04-27 DIAGNOSIS — E1122 Type 2 diabetes mellitus with diabetic chronic kidney disease: Secondary | ICD-10-CM | POA: Diagnosis not present

## 2020-04-27 DIAGNOSIS — M1712 Unilateral primary osteoarthritis, left knee: Secondary | ICD-10-CM | POA: Diagnosis not present

## 2020-04-27 DIAGNOSIS — M1611 Unilateral primary osteoarthritis, right hip: Secondary | ICD-10-CM | POA: Diagnosis not present

## 2020-04-27 DIAGNOSIS — N183 Chronic kidney disease, stage 3 unspecified: Secondary | ICD-10-CM | POA: Diagnosis not present

## 2020-04-27 DIAGNOSIS — M5136 Other intervertebral disc degeneration, lumbar region: Secondary | ICD-10-CM | POA: Diagnosis not present

## 2020-04-27 DIAGNOSIS — I152 Hypertension secondary to endocrine disorders: Secondary | ICD-10-CM | POA: Diagnosis not present

## 2020-04-27 DIAGNOSIS — E1159 Type 2 diabetes mellitus with other circulatory complications: Secondary | ICD-10-CM | POA: Diagnosis not present

## 2020-04-27 DIAGNOSIS — Z471 Aftercare following joint replacement surgery: Secondary | ICD-10-CM | POA: Diagnosis not present

## 2020-04-27 DIAGNOSIS — I4819 Other persistent atrial fibrillation: Secondary | ICD-10-CM | POA: Diagnosis not present

## 2020-04-30 DIAGNOSIS — M5136 Other intervertebral disc degeneration, lumbar region: Secondary | ICD-10-CM | POA: Diagnosis not present

## 2020-04-30 DIAGNOSIS — Z471 Aftercare following joint replacement surgery: Secondary | ICD-10-CM | POA: Diagnosis not present

## 2020-04-30 DIAGNOSIS — M1712 Unilateral primary osteoarthritis, left knee: Secondary | ICD-10-CM | POA: Diagnosis not present

## 2020-04-30 DIAGNOSIS — K2100 Gastro-esophageal reflux disease with esophagitis, without bleeding: Secondary | ICD-10-CM | POA: Diagnosis not present

## 2020-04-30 DIAGNOSIS — N183 Chronic kidney disease, stage 3 unspecified: Secondary | ICD-10-CM | POA: Diagnosis not present

## 2020-04-30 DIAGNOSIS — I4819 Other persistent atrial fibrillation: Secondary | ICD-10-CM | POA: Diagnosis not present

## 2020-04-30 DIAGNOSIS — I152 Hypertension secondary to endocrine disorders: Secondary | ICD-10-CM | POA: Diagnosis not present

## 2020-04-30 DIAGNOSIS — E1122 Type 2 diabetes mellitus with diabetic chronic kidney disease: Secondary | ICD-10-CM | POA: Diagnosis not present

## 2020-04-30 DIAGNOSIS — E1159 Type 2 diabetes mellitus with other circulatory complications: Secondary | ICD-10-CM | POA: Diagnosis not present

## 2020-04-30 DIAGNOSIS — M1611 Unilateral primary osteoarthritis, right hip: Secondary | ICD-10-CM | POA: Diagnosis not present

## 2020-05-01 DIAGNOSIS — M25561 Pain in right knee: Secondary | ICD-10-CM | POA: Diagnosis not present

## 2020-05-01 DIAGNOSIS — M6281 Muscle weakness (generalized): Secondary | ICD-10-CM | POA: Diagnosis not present

## 2020-05-01 DIAGNOSIS — M25661 Stiffness of right knee, not elsewhere classified: Secondary | ICD-10-CM | POA: Diagnosis not present

## 2020-05-01 DIAGNOSIS — Z96651 Presence of right artificial knee joint: Secondary | ICD-10-CM | POA: Diagnosis not present

## 2020-05-02 ENCOUNTER — Telehealth: Payer: Self-pay

## 2020-05-02 ENCOUNTER — Other Ambulatory Visit: Payer: Self-pay

## 2020-05-02 MED ORDER — FLUTICASONE PROPIONATE 50 MCG/ACT NA SUSP: 2.0000 | Freq: Every day | NASAL | 2 refills | Status: DC

## 2020-05-02 NOTE — Telephone Encounter (Signed)
Prescription sent to the pharmacy electronically today.

## 2020-05-02 NOTE — Telephone Encounter (Unsigned)
Copied from [place] [phone]. Topic: General - Other >> May 01, 2020  5:25 PM Pawlus, Brayton Layman A wrote: Reason for CRM: Pharmacist was calling to follow up on a refill request for fluticasone (FLONASE) 50 MCG/ACT nasal spray.

## 2020-05-04 DIAGNOSIS — M6281 Muscle weakness (generalized): Secondary | ICD-10-CM | POA: Diagnosis not present

## 2020-05-04 DIAGNOSIS — M25661 Stiffness of right knee, not elsewhere classified: Secondary | ICD-10-CM | POA: Diagnosis not present

## 2020-05-04 DIAGNOSIS — Z96651 Presence of right artificial knee joint: Secondary | ICD-10-CM | POA: Diagnosis not present

## 2020-05-04 DIAGNOSIS — M25561 Pain in right knee: Secondary | ICD-10-CM | POA: Diagnosis not present

## 2020-05-07 DIAGNOSIS — M25561 Pain in right knee: Secondary | ICD-10-CM | POA: Diagnosis not present

## 2020-05-07 DIAGNOSIS — M25661 Stiffness of right knee, not elsewhere classified: Secondary | ICD-10-CM | POA: Diagnosis not present

## 2020-05-07 DIAGNOSIS — M6281 Muscle weakness (generalized): Secondary | ICD-10-CM | POA: Diagnosis not present

## 2020-05-07 DIAGNOSIS — Z96651 Presence of right artificial knee joint: Secondary | ICD-10-CM | POA: Diagnosis not present

## 2020-05-09 DIAGNOSIS — M6281 Muscle weakness (generalized): Secondary | ICD-10-CM | POA: Diagnosis not present

## 2020-05-09 DIAGNOSIS — Z96651 Presence of right artificial knee joint: Secondary | ICD-10-CM | POA: Diagnosis not present

## 2020-05-09 DIAGNOSIS — M25561 Pain in right knee: Secondary | ICD-10-CM | POA: Diagnosis not present

## 2020-05-09 DIAGNOSIS — M25661 Stiffness of right knee, not elsewhere classified: Secondary | ICD-10-CM | POA: Diagnosis not present

## 2020-05-10 ENCOUNTER — Other Ambulatory Visit: Payer: Self-pay

## 2020-05-10 ENCOUNTER — Encounter: Payer: Self-pay | Admitting: Dermatology

## 2020-05-10 ENCOUNTER — Ambulatory Visit: Payer: Medicare HMO | Admitting: Dermatology

## 2020-05-10 DIAGNOSIS — L570 Actinic keratosis: Secondary | ICD-10-CM | POA: Diagnosis not present

## 2020-05-10 DIAGNOSIS — L821 Other seborrheic keratosis: Secondary | ICD-10-CM | POA: Diagnosis not present

## 2020-05-10 DIAGNOSIS — C44319 Basal cell carcinoma of skin of other parts of face: Secondary | ICD-10-CM

## 2020-05-10 DIAGNOSIS — C44519 Basal cell carcinoma of skin of other part of trunk: Secondary | ICD-10-CM | POA: Diagnosis not present

## 2020-05-10 DIAGNOSIS — L820 Inflamed seborrheic keratosis: Secondary | ICD-10-CM

## 2020-05-10 DIAGNOSIS — C44329 Squamous cell carcinoma of skin of other parts of face: Secondary | ICD-10-CM

## 2020-05-10 DIAGNOSIS — L578 Other skin changes due to chronic exposure to nonionizing radiation: Secondary | ICD-10-CM | POA: Diagnosis not present

## 2020-05-10 DIAGNOSIS — C4492 Squamous cell carcinoma of skin, unspecified: Secondary | ICD-10-CM

## 2020-05-10 DIAGNOSIS — C44320 Squamous cell carcinoma of skin of unspecified parts of face: Secondary | ICD-10-CM

## 2020-05-10 DIAGNOSIS — C44229 Squamous cell carcinoma of skin of left ear and external auricular canal: Secondary | ICD-10-CM | POA: Diagnosis not present

## 2020-05-10 DIAGNOSIS — D485 Neoplasm of uncertain behavior of skin: Secondary | ICD-10-CM

## 2020-05-10 NOTE — Patient Instructions (Signed)

## 2020-05-10 NOTE — Progress Notes (Signed)
Follow-Up Visit   Subjective  Maxwell Stanley is a 86 y.o. male who presents for the following: skin lesion (R lat forehead - has been there for months irregular, tender, will not heal. R mandible - irregular, tender, will not heal. Tender scaly lesions on the face patient is concerned about) and scar (R chest pectoral - previously bx showed benign scar in site of skin cancer. Scar recently started crusting up. Patient is concerned and would like it checked.).  The following portions of the chart were reviewed this encounter and updated as appropriate:   Tobacco  Allergies  Meds  Problems  Med Hx  Surg Hx  Fam Hx     Review of Systems:  No other skin or systemic complaints except as noted in HPI or Assessment and Plan.  Objective  Well appearing patient in no apparent distress; mood and affect are within normal limits.  A focused examination was performed including the face, trunk, extremities. Relevant physical exam findings are noted in the Assessment and Plan.  Objective  R lat forehead: 1.5 x 1.0 cm hyperkeratotic papule   Objective  R mandible: 1.2 cm hyperkeratotic papule   Objective  R chest pectoral: 6.0 x 2.5 cm scar with a 3.0x 1.5 cm crust at area     Objective  Face and ears x 6 (6): Erythematous thin papules/macules with gritty scale.   Objective  Chest x 3 (3): Erythematous keratotic or waxy stuck-on papule or plaque.   Objective  L ear sup helix: Pink biopsy site.   Assessment & Plan  Neoplasm of uncertain behavior of skin (3) R lat forehead  Skin / nail biopsy Type of biopsy: tangential   Informed consent: discussed and consent obtained   Timeout: patient name, date of birth, surgical site, and procedure verified   Procedure prep:  Patient was prepped and draped in usual sterile fashion Prep type:  Isopropyl alcohol Anesthesia: the lesion was anesthetized in a standard fashion   Anesthetic:  1% lidocaine w/ epinephrine 1-100,000  buffered w/ 8.4% NaHCO3 Instrument used: flexible razor blade   Hemostasis achieved with: pressure, aluminum chloride and electrodesiccation   Outcome: patient tolerated procedure well   Post-procedure details: sterile dressing applied and wound care instructions given   Dressing type: bandage and petrolatum    Specimen 1 - Surgical pathology Differential Diagnosis: D48.5 r/o SCC vs BCC vs other  Check Margins: No 1.5 x 1.0 cm hyperkeratotic papule  R mandible  Epidermal / dermal shaving  Lesion diameter (cm):  1.2 Informed consent: discussed and consent obtained   Timeout: patient name, date of birth, surgical site, and procedure verified   Procedure prep:  Patient was prepped and draped in usual sterile fashion Prep type:  Isopropyl alcohol Anesthesia: the lesion was anesthetized in a standard fashion   Anesthetic:  1% lidocaine w/ epinephrine 1-100,000 buffered w/ 8.4% NaHCO3 Instrument used: flexible razor blade   Hemostasis achieved with: pressure, aluminum chloride and electrodesiccation   Outcome: patient tolerated procedure well   Post-procedure details: sterile dressing applied and wound care instructions given   Dressing type: bandage and petrolatum    Destruction of lesion Complexity: extensive   Destruction method: electrodesiccation and curettage   Informed consent: discussed and consent obtained   Timeout:  patient name, date of birth, surgical site, and procedure verified Procedure prep:  Patient was prepped and draped in usual sterile fashion Prep type:  Isopropyl alcohol Anesthesia: the lesion was anesthetized in a standard fashion  Anesthetic:  1% lidocaine w/ epinephrine 1-100,000 buffered w/ 8.4% NaHCO3 Curettage performed in three different directions: Yes   Electrodesiccation performed over the curetted area: Yes   Lesion length (cm):  1.2 Lesion width (cm):  1.2 Margin per side (cm):  0.2 Final wound size (cm):  1.6 Hemostasis achieved with:   pressure, aluminum chloride and electrodesiccation Outcome: patient tolerated procedure well with no complications   Post-procedure details: sterile dressing applied and wound care instructions given   Dressing type: bandage and petrolatum    Specimen 2 - Surgical pathology Differential Diagnosis: D48.5 r/o SCC vs BCC vs other  ED&C today  Check Margins: No 1.2 cm hyperkeratotic papule  R chest pectoral  Skin / nail biopsy Type of biopsy: tangential   Informed consent: discussed and consent obtained   Timeout: patient name, date of birth, surgical site, and procedure verified   Procedure prep:  Patient was prepped and draped in usual sterile fashion Prep type:  Isopropyl alcohol Anesthesia: the lesion was anesthetized in a standard fashion   Anesthetic:  1% lidocaine w/ epinephrine 1-100,000 buffered w/ 8.4% NaHCO3 Instrument used: flexible razor blade   Hemostasis achieved with: pressure, aluminum chloride and electrodesiccation   Outcome: patient tolerated procedure well   Post-procedure details: sterile dressing applied and wound care instructions given   Dressing type: bandage and petrolatum    Specimen 3 - Surgical pathology Differential Diagnosis: D48.5 scar vs recurrent CA  [id number] Check Margins: No 6.0 x 2.5 cm scar with a 3.0 x 1.5 cm crust at area  AK (actinic keratosis) (6) Face and ears x 6  Destruction of lesion - Face and ears x 6 Complexity: simple   Destruction method: cryotherapy   Informed consent: discussed and consent obtained   Timeout:  patient name, date of birth, surgical site, and procedure verified Lesion destroyed using liquid nitrogen: Yes   Region frozen until ice ball extended beyond lesion: Yes   Outcome: patient tolerated procedure well with no complications   Post-procedure details: wound care instructions given    Inflamed seborrheic keratosis (3) Chest x 3  Destruction of lesion - Chest x 3 Complexity: simple   Destruction  method: cryotherapy   Informed consent: discussed and consent obtained   Timeout:  patient name, date of birth, surgical site, and procedure verified Lesion destroyed using liquid nitrogen: Yes   Region frozen until ice ball extended beyond lesion: Yes   Outcome: patient tolerated procedure well with no complications   Post-procedure details: wound care instructions given    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  Actinic Damage - chronic, secondary to cumulative UV radiation exposure/sun exposure over time - diffuse scaly erythematous macules with underlying dyspigmentation - Recommend daily broad spectrum sunscreen SPF 30+ to sun-exposed areas, reapply every 2 hours as needed.  - Recommend staying in the shade or wearing long sleeves, sun glasses (UVA+UVB protection) and wide brim hats (4-inch brim around the entire circumference of the hat). - Call for new or changing lesions.  Seborrheic Keratoses - Stuck-on, waxy, tan-brown papules and/or plaques  - Benign-appearing - Discussed benign etiology and prognosis. - Observe - Call for any changes  Return in about 6 weeks (around 06/21/2020) for follow up on bx's and AK's - schedule surgery for the L ear helix SCC as soon as available .  Luther Redo, CMA, am acting as scribe for  Sarina Ser, MD .  Documentation: I have reviewed the above documentation for accuracy and completeness, and I agree with the above.  Sarina Ser, MD

## 2020-05-11 ENCOUNTER — Telehealth: Payer: Self-pay

## 2020-05-11 DIAGNOSIS — M25661 Stiffness of right knee, not elsewhere classified: Secondary | ICD-10-CM | POA: Diagnosis not present

## 2020-05-11 DIAGNOSIS — M25561 Pain in right knee: Secondary | ICD-10-CM | POA: Diagnosis not present

## 2020-05-11 DIAGNOSIS — Z96651 Presence of right artificial knee joint: Secondary | ICD-10-CM | POA: Diagnosis not present

## 2020-05-11 DIAGNOSIS — M6281 Muscle weakness (generalized): Secondary | ICD-10-CM | POA: Diagnosis not present

## 2020-05-11 NOTE — Telephone Encounter (Signed)
Thank you Estill Bamberg. Okay to cancel. I will ask CPA to call and reschedule.

## 2020-05-11 NOTE — Telephone Encounter (Signed)
Copied from [place] [phone]. Topic: General - Other >> May 11, 2020  8:48 AM Celene Kras wrote: Reason for CRM: Pt called stating that he would like to cancel appt with pharmacist for 05/15/20. Please advise.

## 2020-05-14 ENCOUNTER — Encounter: Payer: Self-pay | Admitting: Dermatology

## 2020-05-14 DIAGNOSIS — M48062 Spinal stenosis, lumbar region with neurogenic claudication: Secondary | ICD-10-CM | POA: Diagnosis not present

## 2020-05-14 DIAGNOSIS — M5416 Radiculopathy, lumbar region: Secondary | ICD-10-CM | POA: Diagnosis not present

## 2020-05-15 ENCOUNTER — Telehealth: Payer: Self-pay

## 2020-05-15 DIAGNOSIS — E1122 Type 2 diabetes mellitus with diabetic chronic kidney disease: Secondary | ICD-10-CM | POA: Diagnosis not present

## 2020-05-15 DIAGNOSIS — Z96651 Presence of right artificial knee joint: Secondary | ICD-10-CM | POA: Diagnosis not present

## 2020-05-15 DIAGNOSIS — I10 Essential (primary) hypertension: Secondary | ICD-10-CM | POA: Diagnosis not present

## 2020-05-15 DIAGNOSIS — E1169 Type 2 diabetes mellitus with other specified complication: Secondary | ICD-10-CM | POA: Diagnosis not present

## 2020-05-15 DIAGNOSIS — N183 Chronic kidney disease, stage 3 unspecified: Secondary | ICD-10-CM | POA: Diagnosis not present

## 2020-05-15 DIAGNOSIS — E782 Mixed hyperlipidemia: Secondary | ICD-10-CM | POA: Diagnosis not present

## 2020-05-15 DIAGNOSIS — I480 Paroxysmal atrial fibrillation: Secondary | ICD-10-CM | POA: Diagnosis not present

## 2020-05-15 DIAGNOSIS — M25661 Stiffness of right knee, not elsewhere classified: Secondary | ICD-10-CM | POA: Diagnosis not present

## 2020-05-15 DIAGNOSIS — M6281 Muscle weakness (generalized): Secondary | ICD-10-CM | POA: Diagnosis not present

## 2020-05-15 DIAGNOSIS — E785 Hyperlipidemia, unspecified: Secondary | ICD-10-CM | POA: Diagnosis not present

## 2020-05-15 DIAGNOSIS — M25561 Pain in right knee: Secondary | ICD-10-CM | POA: Diagnosis not present

## 2020-05-15 NOTE — Telephone Encounter (Signed)
-----   Message from Ralene Bathe, MD sent at 05/14/2020  7:17 PM EDT ----- Diagnosis 1. Skin , right lat forehead BASAL CELL CARCINOMA, NODULAR AND INFILTRATIVE PATTERNS, BASE INVOLVED 2. Skin , right mandible WELL DIFFERENTIATED SQUAMOUS CELL CARCINOMA, BASE INVOLVED 3. Skin , right chest pectoral BASAL CELL CARCINOMA, NODULAR AND INFILTRATIVE PATTERNS, PERIPHERAL AND DEEP MARGINS INVOLVED  1- Cancer - BCC Schedule appt to discuss options which may include Mohs micrographic surgery versus radiation. 2- cancer-SCC Already treated  recheck on follow-up 3-cancer-BCC Recurrent on chest.  Make appointment to discuss treatment options.  This may include Mohs micrographic surgery versus radiation.

## 2020-05-15 NOTE — Telephone Encounter (Signed)
Patient advised of biopsy results. Will keep appointment in June for surgery and discuss results.

## 2020-05-17 DIAGNOSIS — M6281 Muscle weakness (generalized): Secondary | ICD-10-CM | POA: Diagnosis not present

## 2020-05-17 DIAGNOSIS — M25561 Pain in right knee: Secondary | ICD-10-CM | POA: Diagnosis not present

## 2020-05-17 DIAGNOSIS — Z96651 Presence of right artificial knee joint: Secondary | ICD-10-CM | POA: Diagnosis not present

## 2020-05-17 DIAGNOSIS — M25661 Stiffness of right knee, not elsewhere classified: Secondary | ICD-10-CM | POA: Diagnosis not present

## 2020-05-18 DIAGNOSIS — M25661 Stiffness of right knee, not elsewhere classified: Secondary | ICD-10-CM | POA: Diagnosis not present

## 2020-05-18 DIAGNOSIS — Z96651 Presence of right artificial knee joint: Secondary | ICD-10-CM | POA: Diagnosis not present

## 2020-05-18 DIAGNOSIS — M6281 Muscle weakness (generalized): Secondary | ICD-10-CM | POA: Diagnosis not present

## 2020-05-18 DIAGNOSIS — M25561 Pain in right knee: Secondary | ICD-10-CM | POA: Diagnosis not present

## 2020-05-22 DIAGNOSIS — M25661 Stiffness of right knee, not elsewhere classified: Secondary | ICD-10-CM | POA: Diagnosis not present

## 2020-05-22 DIAGNOSIS — M6281 Muscle weakness (generalized): Secondary | ICD-10-CM | POA: Diagnosis not present

## 2020-05-22 DIAGNOSIS — Z96651 Presence of right artificial knee joint: Secondary | ICD-10-CM | POA: Diagnosis not present

## 2020-05-22 DIAGNOSIS — M25561 Pain in right knee: Secondary | ICD-10-CM | POA: Diagnosis not present

## 2020-05-24 DIAGNOSIS — M25661 Stiffness of right knee, not elsewhere classified: Secondary | ICD-10-CM | POA: Diagnosis not present

## 2020-05-24 DIAGNOSIS — Z96651 Presence of right artificial knee joint: Secondary | ICD-10-CM | POA: Diagnosis not present

## 2020-05-24 DIAGNOSIS — M25561 Pain in right knee: Secondary | ICD-10-CM | POA: Diagnosis not present

## 2020-05-24 DIAGNOSIS — M6281 Muscle weakness (generalized): Secondary | ICD-10-CM | POA: Diagnosis not present

## 2020-05-28 ENCOUNTER — Telehealth: Payer: Self-pay

## 2020-05-28 NOTE — Telephone Encounter (Signed)
Please make sure the patient understands that I did not treat the forehead and the chest and both of these DO NEED TREATMENT. If Maxwell Stanley does not want to come in to discuss the options, we will send him for MOHS for the forehead. We will also ask the MOHS surgeon to CONSULT on the chest lesion and if the MOHS surgeon feels the chest lesion is amenable to MOHS, they may schedule him for that procedure also. Advise the patient that the mandible lesion has already been treated and will take a while to heal.  It does not need any further treatment at this time and will not in the future unless it recurs.

## 2020-05-28 NOTE — Telephone Encounter (Signed)
Patient called and stated the areas being treated by Dr. Nehemiah Massed are not healing. He would like to go for [facility] surgery due to having this in the past. Please advise.   His previous BX on the R lat forehead and R chest pectoral were BCC and you wanted to discuss at his next appointment then move forward with scheduling MOHs. Patient would like to go ahead and do this process.   Patient is scheduled with you on 06/19/20 for a SCC on his Left Ear.

## 2020-05-29 DIAGNOSIS — M1712 Unilateral primary osteoarthritis, left knee: Secondary | ICD-10-CM | POA: Diagnosis not present

## 2020-05-29 DIAGNOSIS — Z96651 Presence of right artificial knee joint: Secondary | ICD-10-CM | POA: Diagnosis not present

## 2020-05-29 NOTE — Telephone Encounter (Signed)
Patient advised of information per Dr. Nehemiah Massed.  Referral has been sent with his instructions.

## 2020-05-31 NOTE — Telephone Encounter (Signed)
Patient has been scheduled for October 19th for [facility]. Due to Dr. Lacinda Axon being so booked out he is scheduled for both sites at one time. Per Margreta Journey at Dr. Jonathon Jordan office states if Dr. Lacinda Axon does not think the area needs to be treated he will not so surgery. I wanted to make you aware.

## 2020-06-06 ENCOUNTER — Ambulatory Visit (INDEPENDENT_AMBULATORY_CARE_PROVIDER_SITE_OTHER): Payer: Medicare HMO | Admitting: Internal Medicine

## 2020-06-06 ENCOUNTER — Other Ambulatory Visit: Payer: Self-pay

## 2020-06-06 ENCOUNTER — Encounter: Payer: Self-pay | Admitting: Internal Medicine

## 2020-06-06 VITALS — BP 136/82 | HR 68 | Temp 97.9°F | Ht 74.0 in | Wt 180.0 lb

## 2020-06-06 DIAGNOSIS — E118 Type 2 diabetes mellitus with unspecified complications: Secondary | ICD-10-CM | POA: Diagnosis not present

## 2020-06-06 DIAGNOSIS — I10 Essential (primary) hypertension: Secondary | ICD-10-CM | POA: Diagnosis not present

## 2020-06-06 DIAGNOSIS — K2100 Gastro-esophageal reflux disease with esophagitis, without bleeding: Secondary | ICD-10-CM | POA: Diagnosis not present

## 2020-06-06 LAB — POCT GLYCOSYLATED HEMOGLOBIN (HGB A1C): Hemoglobin A1C: 6.4 % — AB (ref 4.0–5.6)

## 2020-06-06 MED ORDER — METFORMIN HCL ER 500 MG PO TB24: 1000.0000 mg | ORAL_TABLET | Freq: Two times a day (BID) | ORAL | 0 refills | Status: DC

## 2020-06-06 MED ORDER — PANTOPRAZOLE SODIUM 40 MG PO TBEC: 40.0000 mg | DELAYED_RELEASE_TABLET | Freq: Two times a day (BID) | ORAL | 1 refills | Status: DC

## 2020-06-06 NOTE — Progress Notes (Signed)
Date:  06/06/2020   Name:  Maxwell Stanley   DOB:  1934-06-11   MRN:  [id number]   Chief Complaint: Diabetes (Last BS 140 this morning )  Diabetes He presents for his follow-up diabetic visit. He has type 2 diabetes mellitus. His disease course has been stable. Pertinent negatives for hypoglycemia include no headaches or tremors. Pertinent negatives for diabetes include no chest pain, no fatigue, no polydipsia and no polyuria. Current diabetic treatment includes oral agent (monotherapy) (metformin). He is compliant with treatment all of the time. There is no change in his home blood glucose trend. His breakfast blood glucose is taken between 6-7 am. His breakfast blood glucose range is generally 130-140 mg/dl. An ACE inhibitor/angiotensin II receptor blocker is being taken.  Hypertension This is a chronic problem. The problem is controlled. Pertinent negatives include no chest pain, headaches, palpitations or shortness of breath. Past treatments include angiotensin blockers, diuretics, beta blockers and calcium channel blockers. There are no compliance problems.   Gastroesophageal Reflux He complains of heartburn and water brash. He reports no abdominal pain, no chest pain, no coughing, no dysphagia, no sore throat or no wheezing. This is a new problem. The current episode started 1 to 4 weeks ago. The problem occurs frequently. The problem has been gradually worsening. The heartburn duration is several minutes. The heartburn is located in the substernum. The heartburn is of mild intensity. The heartburn does not wake him from sleep. The heartburn does not limit his activity. The heartburn doesn't change with position. Nothing aggravates the symptoms. Pertinent negatives include no fatigue. He has tried a PPI and an antacid for the symptoms. The treatment provided moderate relief.    Lab Results  Component Value Date   CREATININE 1.20 04/04/2020   BUN 27 (H) 04/04/2020   NA 138 04/04/2020    K 4.2 04/04/2020   CL 106 04/04/2020   CO2 23 04/04/2020   Lab Results  Component Value Date   CHOL 123 02/15/2020   HDL 35 (L) 02/15/2020   LDLCALC 56 02/15/2020   TRIG 195 (H) 02/15/2020   CHOLHDL 3.5 02/15/2020   Lab Results  Component Value Date   TSH 2.920 02/02/2019   Lab Results  Component Value Date   HGBA1C 6.4 (A) 06/06/2020   Lab Results  Component Value Date   WBC 5.4 04/04/2020   HGB 11.8 (L) 04/04/2020   HCT 34.3 (L) 04/04/2020   MCV 94.0 04/04/2020   PLT 196 04/04/2020   Lab Results  Component Value Date   ALT 18 04/04/2020   AST 26 04/04/2020   ALKPHOS 61 04/04/2020   BILITOT 0.8 04/04/2020     Review of Systems  Constitutional: Negative for appetite change, fatigue and unexpected weight change.  HENT: Negative for sore throat.   Eyes: Negative for visual disturbance.  Respiratory: Negative for cough, shortness of breath and wheezing.   Cardiovascular: Negative for chest pain, palpitations and leg swelling.  Gastrointestinal: Positive for heartburn. Negative for abdominal pain, blood in stool and dysphagia.  Endocrine: Negative for polydipsia and polyuria.  Genitourinary: Negative for dysuria and hematuria.  Skin: Negative for color change and rash.  Neurological: Negative for tremors, numbness and headaches.  Psychiatric/Behavioral: Negative for dysphoric mood.    Patient Active Problem List   Diagnosis Date Noted  . Total knee replacement status 04/16/2020  . Acquired thrombophilia ([place]) 02/15/2020  . Primary osteoarthritis of both knees 05/19/2019  . Gastroesophageal reflux disease with esophagitis 02/02/2019  .  Personal history of other malignant neoplasm of skin 12/17/2018  . Persistent atrial fibrillation ([place]) 10/29/2018  . Reducible right inguinal hernia 09/16/2018  . DDD (degenerative disc disease), lumbar 12/23/2017  . Primary osteoarthritis of right hip 12/23/2017  . Onychomycosis of toenail 08/19/2017  . Allergic rhinitis  08/08/2016  . Carpal tunnel syndrome on left 12/12/2015  . Chronic maxillary sinusitis 04/09/2015  . Type II diabetes mellitus with complication ([place]) 26/83/4196  . Chronic low back pain 09/21/2014  . Abnormal prostate specific antigen 04/17/2014  . Enlarged prostate with lower urinary tract symptoms (LUTS) 04/17/2014  . CKD stage 3 secondary to diabetes ([place]) 04/17/2014  . Hyperlipidemia associated with type 2 diabetes mellitus ([place]) 04/17/2014  . 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 Essential (primary) hypertension 04/17/2014  . Back muscle spasm 04/17/2014  . Degenerative arthritis of finger 04/17/2014  . Breast development in males 04/17/2014  . 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 EDEMA 08/24/2009    Allergies  Allergen Reactions  . Codeine Other (See Comments)    Made his "Wild"  . Ceftriaxone Rash  . Penicillin G Rash    Did it involve swelling of the face/tongue/throat, SOB, or low BP? No Did it involve sudden or severe rash/hives, skin peeling, or any reaction on the inside of your mouth or nose? No Did you need to seek medical attention at a hospital or doctor's office? No When did it last happen? If all above answers are "NO", may proceed with cephalosporin use.  IgE = 35 (WNL) on 01/18/2020     Past Surgical History:  Procedure Laterality Date  . ANKLE ARTHROSCOPY WITH OPEN REDUCTION INTERNAL FIXATION (ORIF) Left 1998  . COLONOSCOPY    . ETHMOIDECTOMY Left 03/08/2020   Procedure: TOTAL ETHMOIDECTOMY;  Surgeon: Margaretha Sheffield, MD;  Location: [place];  Service: ENT;  Laterality: Left;  . FRONTAL SINUS EXPLORATION Left 03/08/2020   Procedure: FRONTAL SINUS EXPLORATION;  Surgeon: Margaretha Sheffield, MD;  Location: [place];  Service: ENT;  Laterality: Left;  . HEMORRHOID SURGERY    . HERNIA REPAIR Left 2229   umbilical and left inguinal  . IMAGE GUIDED SINUS SURGERY N/A 03/08/2020   Procedure: IMAGE GUIDED SINUS SURGERY;  Surgeon:  Margaretha Sheffield, MD;  Location: [place];  Service: ENT;  Laterality: N/A;  placed disk on OR charge nurse desk 2-21 kp PLACED 2ND DISK ON OR CHARGE NURSE DESK 02-28-20 KP 3RD DISK ON OR CHARGE NURSE DESK 2-25- KP  . INGUINAL HERNIA REPAIR Right 11/12/2018   Procedure: HERNIA REPAIR INGUINAL ADULT;  Surgeon: Fredirick Maudlin, MD;  Location: [facility];  Service: General;  Laterality: Right;  . INGUINAL HERNIA REPAIR Left 2012  . KNEE ARTHROPLASTY Right 04/16/2020   Procedure: COMPUTER ASSISTED TOTAL KNEE ARTHROPLASTY;  Surgeon: Dereck Leep, MD;  Location: [facility];  Service: Orthopedics;  Laterality: Right;  . KNEE ARTHROSCOPY Right 2006  . MAXILLARY ANTROSTOMY Left 03/08/2020   Procedure: MAXILLARY ANTROSTOMY WITH TISSUE REMOVAL;  Surgeon: Margaretha Sheffield, MD;  Location: [place];  Service: ENT;  Laterality: Left;  . SEPTOPLASTY N/A 03/08/2020   Procedure: SEPTOPLASTY;  Surgeon: Margaretha Sheffield, MD;  Location: [place];  Service: ENT;  Laterality: N/A;  . skin cancer resection Right 04/07/2018   ON hand.  [name] THUMB ARTHROSCOPY Right 2013   tendon flap to joint  . TONSILLECTOMY    . UPPER GI ENDOSCOPY      Social History   Tobacco Use  .  Smoking status: Never Smoker  . Smokeless tobacco: Never Used  Vaping Use  . Vaping Use: Never used  Substance Use Topics  . Alcohol use: No    Alcohol/week: 0.0 standard drinks  . Drug use: No     Medication list has been reviewed and updated.  Current Meds  Medication Sig  . acetaminophen (TYLENOL) 500 MG tablet Take 1,000 mg by mouth daily.  [name] amLODipine (NORVASC) 2.5 MG tablet Take 1 tablet (2.5 mg total) by mouth daily.  [name] atenolol (TENORMIN) 50 MG tablet Take 1 tablet (50 mg total) by mouth at bedtime.  . celecoxib (CELEBREX) 200 MG capsule Take 200 mg by mouth 2 (two) times daily.  . cholecalciferol (VITAMIN D) 1000 UNITS tablet Take 1,000 Units by mouth daily.  . clotrimazole (LOTRIMIN) 1 % cream Apply 1  application topically 2 (two) times daily. (Patient taking differently: Apply 1 application topically 2 (two) times daily as needed (fungus).)  . docusate sodium (COLACE) 100 MG capsule Take 200 mg by mouth daily.   [name] ELIQUIS 5 MG TABS tablet Take 1 tablet (5 mg total) by mouth 2 (two) times daily.  . finasteride (PROSCAR) 5 MG tablet Take 1 tablet (5 mg total) by mouth daily.  . fluticasone (FLONASE) 50 MCG/ACT nasal spray Place 2 sprays into both nostrils daily.  [name] glucose blood (ONE TOUCH ULTRA TEST) test strip Use to test blood sugar once daily.  . hydrochlorothiazide (MICROZIDE) 12.5 MG capsule Take 1 capsule (12.5 mg total) by mouth daily.  [name] losartan (COZAAR) 100 MG tablet Take 0.5 tablets (50 mg total) by mouth 2 (two) times daily.  . Melatonin 10 MG CAPS Take 10 mg by mouth at bedtime.  . Multiple Vitamin (MULTIVITAMIN WITH MINERALS) TABS tablet Take 0.5 tablets by mouth in the morning and at bedtime.  [name] Delica Lancets 00L MISC 1 each by Does not apply route daily. Use to test blood sugar once daily.  . Probiotic Product (PROBIOTIC DAILY PO) Take 1 tablet by mouth daily.  . rosuvastatin (CRESTOR) 20 MG tablet TAKE 1 TABLET AT BEDTIME  . sitaGLIPtin (JANUVIA) 100 MG tablet Take 1 tablet (100 mg total) by mouth daily.  . tamsulosin (FLOMAX) 0.4 MG CAPS capsule TAKE 1 CAPSULE DAILY AFTER BREAKFAST (Patient taking differently: Take 0.4 mg by mouth daily after breakfast.)  . triamcinolone cream (KENALOG) 0.1 % Apply 1 application topically daily as needed for irritation.  . [DISCONTINUED] metFORMIN (GLUCOPHAGE-XR) 500 MG 24 hr tablet Take 3 tablets (1,500 mg total) by mouth at bedtime.  . [DISCONTINUED] pantoprazole (PROTONIX) 40 MG tablet Take 1 tablet (40 mg total) by mouth daily.    PHQ 2/9 Scores 06/06/2020 02/15/2020 11/23/2019 07/13/2019  PHQ - 2 Score 0 4 0 0  PHQ- 9 Score 0 6 - 3    GAD 7 : Generalized Anxiety Score 06/06/2020 02/15/2020 07/13/2019 05/19/2019  Nervous, Anxious, on  Edge 0 0 0 0  Control/stop worrying 0 0 0 0  Worry too much - different things 0 0 0 0  Trouble relaxing 0 0 0 0  Restless 0 0 0 0  Easily annoyed or irritable 0 0 0 0  Afraid - awful might happen 0 0 0 0  Total GAD 7 Score 0 0 0 0  Anxiety Difficulty - Not difficult at all Not difficult at all Not difficult at all    BP Readings from Last 3 Encounters:  06/06/20 136/82  04/17/20 138/71  04/04/20 132/75  Physical Exam Vitals and nursing note reviewed.  Constitutional:      General: He is not in acute distress.    Appearance: He is well-developed.  HENT:     Head: Normocephalic and atraumatic.  Neck:     Vascular: No carotid bruit.  Cardiovascular:     Rate and Rhythm: Normal rate and regular rhythm.     Pulses: Normal pulses.     Heart sounds: No murmur heard.   Pulmonary:     Effort: Pulmonary effort is normal. No respiratory distress.     Breath sounds: No wheezing or rhonchi.  Abdominal:     General: Abdomen is flat. There is no distension.     Palpations: Abdomen is soft. There is no mass.     Tenderness: There is no abdominal tenderness. There is no guarding or rebound.     Hernia: No hernia is present.  Musculoskeletal:     Cervical back: Normal range of motion.     Right lower leg: No edema.     Left lower leg: No edema.  Lymphadenopathy:     Cervical: No cervical adenopathy.  Skin:    General: Skin is warm and dry.     Findings: Lesion (two skin cancers - forehead and chest awaiting MOHS ) present.  Neurological:     Mental Status: He is alert and oriented to person, place, and time.  Psychiatric:        Mood and Affect: Mood normal.        Behavior: Behavior normal.     Wt Readings from Last 3 Encounters:  06/06/20 180 lb (81.6 kg)  04/04/20 188 lb 8 oz (85.5 kg)  03/08/20 183 lb (83 kg)    BP 136/82   Pulse 68   Temp 97.9 F (36.6 C) (Oral)   Ht 6\' 2"  (1.88 m)   Wt 180 lb (81.6 kg)   SpO2 100%   BMI 23.11 kg/m   Assessment and  Plan: 1. Type II diabetes mellitus with complication (HCC) BS very well controlled.  Will increase metformin to 1000 mg bid and reduce januvia to 50 mg for cost savings. Continue healthy diet and regular exercise - POCT HgB A1C = 6.4 (down from 6.8) - metFORMIN (GLUCOPHAGE-XR) 500 MG 24 hr tablet; Take 2 tablets (1,000 mg total) by mouth in the morning and at bedtime.  Dispense: 360 tablet; Refill: 0  2. Esophagitis, reflux Increase in sx recently without red flags signs Will increase Protonix to bid for several weeks then reduce to daily - pantoprazole (PROTONIX) 40 MG tablet; Take 1 tablet (40 mg total) by mouth 2 (two) times daily.  Dispense: 180 tablet; Refill: 1  3. Essential (primary) hypertension Clinically stable exam with well controlled BP. Tolerating medications without side effects at this time. Pt to continue current regimen and low sodium diet; benefits of regular exercise as able discussed.   Partially dictated using Editor, commissioning. Any errors are unintentional.  Halina Maidens, MD [facility]  06/06/2020

## 2020-06-13 ENCOUNTER — Telehealth: Payer: Self-pay

## 2020-06-13 NOTE — Chronic Care Management (AMB) (Signed)
Chronic Care Management Pharmacy Assistant   Name: Maxwell Stanley  MRN: [id number] DOB: 25-Feb-1934  Reason for Encounter: Disease State Diabetes Mellitus    Recent office visits:  06/06/2020-Laura [name], MD (PCP) General follow up. Increase Metformin to 1000 mg bid from 1,500 at bed time, and decreased Januvia to 50 mg for cost savings. Increase Protonix to bid from 40 mg daily for several weeks then reduce to daily. Follow up in 4 months. 02/15/2020-Laura [name], MD (PCP) Annual physical exam visit. Decreased Januvia to 50 mg per day. Labs ordered. Follow up in 4 months.  Recent consult visits:  05/24/2020-Matthew Paulakonis,PA (Physical Therapy) 05/22/2020-Sunny Patel,PA (Physical Therapy) 05/18/2020-Sunny Patel,PA (Physical Therapy) 05/17/2020-Sunny Patel,PA (Physical Therapy) 05/15/2020-Sunny Patel,PA (Physical Therapy) 05/11/2020-Matthew Paulakonis,PA (Physical Therapy) 05/10/2020-David Nehemiah Massed, MD (Dermatology) Seen for Skin lesion. Epidermal/dermal shaving. Was given 1% lidocaine w/ epinephrine 1-100,000 buffered w/ 8.4% NaHCO3. Follow up in 6 weeks. 05/09/2020-Matthew Paulakonis,PA (Physical Therapy) 05/07/2020-Sunny Patel,PA (Physical Therapy) 05/04/2020-Sunny Patel,PA (Physical Therapy) 05/01/2020-Matthew Paulakonis,PA (Physical Therapy) 04/06/2020- Tamala Julian (Orthopedic) Seen for knee pain. Patient has elected to proceed with a total joint arthroplasty. 02/23/2020-Paul Juengel (Otolaryngology) 02/20/2020-Paul Juengel (Otolaryngology) 02/16/2020-Paul Juengel (Otolaryngology) Hospital visits:  None in previous 6 months  Medications: Outpatient Encounter Medications as of 06/13/2020  Medication Sig   acetaminophen (TYLENOL) 500 MG tablet Take 1,000 mg by mouth daily.   amLODipine (NORVASC) 2.5 MG tablet Take 1 tablet (2.5 mg total) by mouth daily.   atenolol (TENORMIN) 50 MG tablet Take 1 tablet (50 mg total) by mouth at bedtime.   celecoxib (CELEBREX)  200 MG capsule Take 200 mg by mouth 2 (two) times daily.   cholecalciferol (VITAMIN D) 1000 UNITS tablet Take 1,000 Units by mouth daily.   clotrimazole (LOTRIMIN) 1 % cream Apply 1 application topically 2 (two) times daily. (Patient taking differently: Apply 1 application topically 2 (two) times daily as needed (fungus).)   docusate sodium (COLACE) 100 MG capsule Take 200 mg by mouth daily.    ELIQUIS 5 MG TABS tablet Take 1 tablet (5 mg total) by mouth 2 (two) times daily.   finasteride (PROSCAR) 5 MG tablet Take 1 tablet (5 mg total) by mouth daily.   fluticasone (FLONASE) 50 MCG/ACT nasal spray Place 2 sprays into both nostrils daily.   glucose blood (ONE TOUCH ULTRA TEST) test strip Use to test blood sugar once daily.   hydrochlorothiazide (MICROZIDE) 12.5 MG capsule Take 1 capsule (12.5 mg total) by mouth daily.   losartan (COZAAR) 100 MG tablet Take 0.5 tablets (50 mg total) by mouth 2 (two) times daily.   Melatonin 10 MG CAPS Take 10 mg by mouth at bedtime.   metFORMIN (GLUCOPHAGE-XR) 500 MG 24 hr tablet Take 2 tablets (1,000 mg total) by mouth in the morning and at bedtime.   Multiple Vitamin (MULTIVITAMIN WITH MINERALS) TABS tablet Take 0.5 tablets by mouth in the morning and at bedtime.   OneTouch Delica Lancets 34L MISC 1 each by Does not apply route daily. Use to test blood sugar once daily.   pantoprazole (PROTONIX) 40 MG tablet Take 1 tablet (40 mg total) by mouth 2 (two) times daily.   Probiotic Product (PROBIOTIC DAILY PO) Take 1 tablet by mouth daily.   rosuvastatin (CRESTOR) 20 MG tablet TAKE 1 TABLET AT BEDTIME   sitaGLIPtin (JANUVIA) 100 MG tablet Take 1 tablet (100 mg total) by mouth daily.   tamsulosin (FLOMAX) 0.4 MG CAPS capsule TAKE 1 CAPSULE DAILY AFTER BREAKFAST (Patient taking differently: Take 0.4 mg  by mouth daily after breakfast.)   triamcinolone cream (KENALOG) 0.1 % Apply 1 application topically daily as needed for irritation.   No facility-administered  encounter medications on file as of 06/13/2020.   Recent Relevant Labs: Lab Results  Component Value Date/Time   HGBA1C 6.4 (A) 06/06/2020 08:30 AM   HGBA1C 6.8 (H) 04/04/2020 10:05 AM   HGBA1C 6.1 (H) 01/18/2020 07:42 AM    Kidney Function Lab Results  Component Value Date/Time   CREATININE 1.20 04/04/2020 10:05 AM   CREATININE 1.37 (H) 01/18/2020 07:42 AM   GFRNONAA >60 04/04/2020 10:05 AM   GFRAA 55 (L) 05/19/2019 08:26 AM    Current antihyperglycemic regimen:  Metformin 500 mg take 2 tabs in the morning and at bedtime Januvia 100 mg take 1 tab daily  What recent interventions/DTPs have been made to improve glycemic control:          02/15/2020-Laura [name], MD (PCP) Decreased Januvia to 50 mg per day  Have there been any recent hospitalizations or ED visits since last visit with CPP? No   Patient denies hypoglycemic symptoms, including Pale, Sweaty, Shaky, Hungry, Nervous/irritable, and Vision changes   Patient denies hyperglycemic symptoms, including blurry vision, excessive thirst, fatigue, polyuria, and weakness   How often are you checking your blood sugar? once daily   What are your blood sugars ranging?  Fasting: 160 Before meals:  After meals:  Bedtime:   During the week, how often does your blood glucose drop below 70? Never   Are you checking your feet daily/regularly?        Patient states he does check his feet daily.  Adherence Review: Is the patient currently on a STATIN medication? Yes Is the patient currently on ACE/ARB medication? No Does the patient have >5 day gap between last estimated fill dates? No  Patient states he would not like to reschedule his appointment with [facility].  Star Rating Drugs: Losartan 100 mg Last filled:05/04/20 90 DS Metformin 500 mg Last filled:04/16/20 90 DS Rosuvastatin 20 mg Last filled:04/24/20 90 DS Januvia 100 mg Last filled:04/12/20 90 DS

## 2020-06-14 ENCOUNTER — Other Ambulatory Visit: Payer: Self-pay

## 2020-06-14 DIAGNOSIS — C44310 Basal cell carcinoma of skin of unspecified parts of face: Secondary | ICD-10-CM

## 2020-06-14 NOTE — Progress Notes (Signed)
Patient is now scheduled at [facility] for [facility] due to Dr. Lacinda Axon at [facility] scheduling out into October.

## 2020-06-16 ENCOUNTER — Other Ambulatory Visit: Payer: Self-pay | Admitting: Internal Medicine

## 2020-06-16 DIAGNOSIS — N401 Enlarged prostate with lower urinary tract symptoms: Secondary | ICD-10-CM

## 2020-06-16 NOTE — Telephone Encounter (Signed)
Requested Prescriptions  Pending Prescriptions Disp Refills  . tamsulosin (FLOMAX) 0.4 MG CAPS capsule [Pharmacy Med Name: TAMSULOSIN CAP 0.4MG ] 90 capsule 0    Sig: TAKE 1 CAPSULE DAILY AFTER BREAKFAST     Urology: Alpha-Adrenergic Blocker Passed - 06/16/2020 10:02 AM      Passed - Last BP in normal range    BP Readings from Last 1 Encounters:  06/06/20 136/82         Passed - Valid encounter within last 12 months    Recent Outpatient Visits          1 week ago Type II diabetes mellitus with complication [facility])   [facility] Glean Hess, MD   4 months ago Annual physical exam   [facility] Glean Hess, MD   8 months ago Type II diabetes mellitus with complication [facility])   [facility] Glean Hess, MD   11 months ago Type II diabetes mellitus with complication [facility])   [facility] Glean Hess, MD   1 year ago Type II diabetes mellitus with complication [facility])   [facility] Glean Hess, MD      Future Appointments            In 1 month Ralene Bathe, MD [place]   In 3 months [name] Jesse Sans, MD [facility] [place]   In 8 months [name], Jesse Sans, MD [facility]

## 2020-06-19 ENCOUNTER — Ambulatory Visit: Payer: Medicare HMO | Admitting: Dermatology

## 2020-06-19 ENCOUNTER — Other Ambulatory Visit: Payer: Self-pay

## 2020-06-19 DIAGNOSIS — C44229 Squamous cell carcinoma of skin of left ear and external auricular canal: Secondary | ICD-10-CM

## 2020-06-19 DIAGNOSIS — C44719 Basal cell carcinoma of skin of left lower limb, including hip: Secondary | ICD-10-CM

## 2020-06-19 DIAGNOSIS — D0472 Carcinoma in situ of skin of left lower limb, including hip: Secondary | ICD-10-CM

## 2020-06-19 DIAGNOSIS — C4491 Basal cell carcinoma of skin, unspecified: Secondary | ICD-10-CM

## 2020-06-19 DIAGNOSIS — L570 Actinic keratosis: Secondary | ICD-10-CM | POA: Diagnosis not present

## 2020-06-19 DIAGNOSIS — C4492 Squamous cell carcinoma of skin, unspecified: Secondary | ICD-10-CM

## 2020-06-19 DIAGNOSIS — D099 Carcinoma in situ, unspecified: Secondary | ICD-10-CM

## 2020-06-19 DIAGNOSIS — L578 Other skin changes due to chronic exposure to nonionizing radiation: Secondary | ICD-10-CM

## 2020-06-19 DIAGNOSIS — D485 Neoplasm of uncertain behavior of skin: Secondary | ICD-10-CM

## 2020-06-19 HISTORY — DX: Basal cell carcinoma of skin, unspecified: C44.91

## 2020-06-19 HISTORY — DX: Carcinoma in situ, unspecified: D09.9

## 2020-06-19 NOTE — Progress Notes (Signed)
Follow-Up Visit   Subjective  Maxwell Stanley is a 86 y.o. 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 He has some sores on his legs to check.  The following portions of the chart were reviewed this encounter and updated as appropriate:   Tobacco  Allergies  Meds  Problems  Med Hx  Surg Hx  Fam Hx      Review of Systems:  No other skin or systemic complaints except as noted in HPI or Assessment and Plan.  Objective  Well appearing patient in no apparent distress; mood and affect are within normal limits.  A focused examination was performed including face, ears left lower leg. Relevant physical exam findings are noted in the Assessment and Plan.  Left distal calf 2.0 cm crusted papule     Left proximal calf 1.5 cm crusted papule     Left Ear 1.0 cm well healed biopsy site  Left Ear (2) Erythematous thin papules/macules with gritty scale.    Assessment & Plan  Neoplasm of uncertain behavior of skin (2) Left distal calf  Skin / nail biopsy Type of biopsy: tangential   Informed consent: discussed and consent obtained   Timeout: patient name, date of birth, surgical site, and procedure verified   Procedure prep:  Patient was prepped and draped in usual sterile fashion Prep type:  Isopropyl alcohol Anesthesia: the lesion was anesthetized in a standard fashion   Anesthetic:  1% lidocaine w/ epinephrine 1-100,000 buffered w/ 8.4% NaHCO3 Instrument used: flexible razor blade   Hemostasis achieved with: pressure, aluminum chloride and electrodesiccation   Outcome: patient tolerated procedure well   Post-procedure details: sterile dressing applied and wound care instructions given   Dressing type: bandage and petrolatum    Specimen 1 - Surgical pathology Differential Diagnosis: SCC vs other  Check Margins: No 2.0 cm crusted papule  Left proximal calf  Skin / nail biopsy Type of biopsy: tangential    Informed consent: discussed and consent obtained   Timeout: patient name, date of birth, surgical site, and procedure verified   Procedure prep:  Patient was prepped and draped in usual sterile fashion Prep type:  Isopropyl alcohol Anesthesia: the lesion was anesthetized in a standard fashion   Anesthetic:  1% lidocaine w/ epinephrine 1-100,000 buffered w/ 8.4% NaHCO3 Instrument used: flexible razor blade   Hemostasis achieved with: pressure, aluminum chloride and electrodesiccation   Outcome: patient tolerated procedure well   Post-procedure details: sterile dressing applied and wound care instructions given   Dressing type: bandage and petrolatum    Specimen 2 - Surgical pathology Differential Diagnosis: SCC vs other Check Margins: No 1.5 cm crusted papule  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  Destruction of lesion Complexity: simple   Destruction method: cryotherapy   Informed consent: discussed and consent obtained   Timeout:  patient name, date of birth, surgical site, and procedure verified Lesion destroyed using liquid nitrogen: Yes   Region frozen until ice ball extended beyond lesion: Yes   Lesion length (cm):  1 Lesion width (cm):  1 Margin per side (cm):  0.2 Final wound size (cm):  1.4 Outcome: patient tolerated procedure well with no complications   Post-procedure details: wound care instructions given    AK (actinic keratosis) (2) Left Ear  Destruction of lesion - Left Ear Complexity: simple   Destruction method: cryotherapy   Informed consent: discussed and consent obtained   Timeout:  patient name, date  of birth, surgical site, and procedure verified Lesion destroyed using liquid nitrogen: Yes   Region frozen until ice ball extended beyond lesion: Yes   Outcome: patient tolerated procedure well with no complications   Post-procedure details: wound care instructions given    Actinic Damage - chronic, secondary to cumulative UV radiation  exposure/sun exposure over time - diffuse scaly erythematous macules with underlying dyspigmentation - Recommend daily broad spectrum sunscreen SPF 30+ to sun-exposed areas, reapply every 2 hours as needed.  - Recommend staying in the shade or wearing long sleeves, sun glasses (UVA+UVB protection) and wide brim hats (4-inch brim around the entire circumference of the hat). - Call for new or changing lesions.  Return for Follow up as scheduled.  I, Ashok Cordia, CMA, am acting as scribe for Sarina Ser, MD .  Documentation: I have reviewed the above documentation for accuracy and completeness, and I agree with the above.  Sarina Ser, MD

## 2020-06-19 NOTE — Patient Instructions (Signed)
Wound Care Instructions  Cleanse wound gently with soap and water once a day then pat dry with clean gauze. Apply a thing coat of Petrolatum (petroleum jelly, "Vaseline") over the wound (unless you have an allergy to this). We recommend that you use a new, sterile tube of Vaseline. Do not pick or remove scabs. Do not remove the yellow or white "healing tissue" from the base of the wound.  Cover the wound with fresh, clean, nonstick gauze and secure with paper tape. You may use Band-Aids in place of gauze and tape if the would is small enough, but would recommend trimming much of the tape off as there is often too much. Sometimes Band-Aids can irritate the skin.  You should call the office for your biopsy report after 1 week if you have not already been contacted.  If you experience any problems, such as abnormal amounts of bleeding, swelling, significant bruising, significant pain, or evidence of infection, please call the office immediately.  FOR ADULT SURGERY PATIENTS: If you need something for pain relief you may take 1 extra strength Tylenol (acetaminophen) AND 2 Ibuprofen (200mg each) together every 4 hours as needed for pain. (do not take these if you are allergic to them or if you have a reason you should not take them.) Typically, you may only need pain medication for 1 to 3 days.   If you have any questions or concerns for your doctor, please call our main line at [phone] and press option 4 to reach your doctor's medical assistant. If no one answers, please leave a voicemail as directed and we will return your call as soon as possible. Messages left after 4 pm will be answered the following business day.   You may also send us a message via MyChart. We typically respond to MyChart messages within 1-2 business days.  For prescription refills, please ask your pharmacy to contact our office. Our fax number is [phone].  If you have an urgent issue when the clinic is closed that  cannot wait until the next business day, you can page your doctor at the number below.    Please note that while we do our best to be available for urgent issues outside of office hours, we are not available 24/7.   If you have an urgent issue and are unable to reach us, you may choose to seek medical care at your doctor's office, retail clinic, urgent care center, or emergency room.  If you have a medical emergency, please immediately call 911 or go to the emergency department.  Pager Numbers  - Dr. Kowalski: [phone]  - Dr. Moye: [phone]  - Dr. Stewart: [phone]  In the event of inclement weather, please call our main line at [phone] for an update on the status of any delays or closures.  Dermatology Medication Tips: Please keep the boxes that topical medications come in in order to help keep track of the instructions about where and how to use these. Pharmacies typically print the medication instructions only on the boxes and not directly on the medication tubes.   If your medication is too expensive, please contact our office at [phone] option 4 or send us a message through MyChart.   We are unable to tell what your co-pay for medications will be in advance as this is different depending on your insurance coverage. However, we may be able to find a substitute medication at lower cost or fill out paperwork to get insurance to cover a needed   medication.   If a prior authorization is required to get your medication covered by your insurance company, please allow us 1-2 business days to complete this process.  Drug prices often vary depending on where the prescription is filled and some pharmacies may offer cheaper prices.  The website www.goodrx.com contains coupons for medications through different pharmacies. The prices here do not account for what the cost may be with help from insurance (it may be cheaper with your insurance), but the website can give you the  price if you did not use any insurance.  - You can print the associated coupon and take it with your prescription to the pharmacy.  - You may also stop by our office during regular business hours and pick up a GoodRx coupon card.  - If you need your prescription sent electronically to a different pharmacy, notify our office through [place] MyChart or by phone at [phone] option 4.   

## 2020-06-25 ENCOUNTER — Telehealth: Payer: Self-pay

## 2020-06-25 ENCOUNTER — Other Ambulatory Visit: Payer: Self-pay | Admitting: Internal Medicine

## 2020-06-25 ENCOUNTER — Ambulatory Visit: Payer: Self-pay

## 2020-06-25 NOTE — Telephone Encounter (Signed)
Patient informed of pathology results. Plan to treat both sites at his next appointment on 08/09/20.

## 2020-06-25 NOTE — Telephone Encounter (Signed)
Future visit in 3 months  

## 2020-06-25 NOTE — Telephone Encounter (Signed)
-----   Message from Ralene Bathe, MD sent at 06/25/2020  5:40 PM EDT ----- Diagnosis 1. Skin , left distal calf BASAL CELL CARCINOMA, NODULAR PATTERN, BASE INVOLVED 2. Skin , left proximal calf SQUAMOUS CELL CARCINOMA IN SITU, BASE INVOLVED  1- Cancer - BCC Schedule for treatment (EDC) 2- Cancer - SCC in situ Superficial Schedule for treatment (EDC)

## 2020-06-26 ENCOUNTER — Encounter: Payer: Self-pay | Admitting: Dermatology

## 2020-07-21 ENCOUNTER — Other Ambulatory Visit: Payer: Self-pay | Admitting: Dermatology

## 2020-07-21 DIAGNOSIS — D485 Neoplasm of uncertain behavior of skin: Secondary | ICD-10-CM

## 2020-07-24 DIAGNOSIS — C44319 Basal cell carcinoma of skin of other parts of face: Secondary | ICD-10-CM | POA: Diagnosis not present

## 2020-08-09 ENCOUNTER — Encounter: Payer: Self-pay | Admitting: Dermatology

## 2020-08-09 ENCOUNTER — Other Ambulatory Visit: Payer: Self-pay

## 2020-08-09 ENCOUNTER — Ambulatory Visit: Payer: Medicare HMO | Admitting: Dermatology

## 2020-08-09 DIAGNOSIS — L814 Other melanin hyperpigmentation: Secondary | ICD-10-CM

## 2020-08-09 DIAGNOSIS — D099 Carcinoma in situ, unspecified: Secondary | ICD-10-CM

## 2020-08-09 DIAGNOSIS — D0472 Carcinoma in situ of skin of left lower limb, including hip: Secondary | ICD-10-CM

## 2020-08-09 DIAGNOSIS — R21 Rash and other nonspecific skin eruption: Secondary | ICD-10-CM | POA: Diagnosis not present

## 2020-08-09 DIAGNOSIS — C4492 Squamous cell carcinoma of skin, unspecified: Secondary | ICD-10-CM

## 2020-08-09 DIAGNOSIS — L820 Inflamed seborrheic keratosis: Secondary | ICD-10-CM

## 2020-08-09 DIAGNOSIS — C44529 Squamous cell carcinoma of skin of other part of trunk: Secondary | ICD-10-CM

## 2020-08-09 DIAGNOSIS — L578 Other skin changes due to chronic exposure to nonionizing radiation: Secondary | ICD-10-CM

## 2020-08-09 DIAGNOSIS — L570 Actinic keratosis: Secondary | ICD-10-CM | POA: Diagnosis not present

## 2020-08-09 DIAGNOSIS — Z85828 Personal history of other malignant neoplasm of skin: Secondary | ICD-10-CM | POA: Diagnosis not present

## 2020-08-09 DIAGNOSIS — D229 Melanocytic nevi, unspecified: Secondary | ICD-10-CM

## 2020-08-09 DIAGNOSIS — Z1283 Encounter for screening for malignant neoplasm of skin: Secondary | ICD-10-CM | POA: Diagnosis not present

## 2020-08-09 DIAGNOSIS — D1800 Hemangioma unspecified site: Secondary | ICD-10-CM

## 2020-08-09 DIAGNOSIS — C44719 Basal cell carcinoma of skin of left lower limb, including hip: Secondary | ICD-10-CM | POA: Diagnosis not present

## 2020-08-09 DIAGNOSIS — L821 Other seborrheic keratosis: Secondary | ICD-10-CM | POA: Diagnosis not present

## 2020-08-09 MED ORDER — TACROLIMUS 0.1 % EX OINT: TOPICAL_OINTMENT | Freq: Two times a day (BID) | CUTANEOUS | 2 refills | Status: DC

## 2020-08-09 NOTE — Patient Instructions (Signed)
Wound Care Instructions  Cleanse wound gently with soap and water once a day then pat dry with clean gauze. Apply a thing coat of Petrolatum (petroleum jelly, "Vaseline") over the wound (unless you have an allergy to this). We recommend that you use a new, sterile tube of Vaseline. Do not pick or remove scabs. Do not remove the yellow or white "healing tissue" from the base of the wound.  Cover the wound with fresh, clean, nonstick gauze and secure with paper tape. You may use Band-Aids in place of gauze and tape if the would is small enough, but would recommend trimming much of the tape off as there is often too much. Sometimes Band-Aids can irritate the skin.  You should call the office for your biopsy report after 1 week if you have not already been contacted.  If you experience any problems, such as abnormal amounts of bleeding, swelling, significant bruising, significant pain, or evidence of infection, please call the office immediately.  FOR ADULT SURGERY PATIENTS: If you need something for pain relief you may take 1 extra strength Tylenol (acetaminophen) AND 2 Ibuprofen (200mg each) together every 4 hours as needed for pain. (do not take these if you are allergic to them or if you have a reason you should not take them.) Typically, you may only need pain medication for 1 to 3 days.    Cryotherapy Aftercare  Wash gently with soap and water everyday.   Apply Vaseline and Band-Aid daily until healed.    If you have any questions or concerns for your doctor, please call our main line at [phone] and press option 4 to reach your doctor's medical assistant. If no one answers, please leave a voicemail as directed and we will return your call as soon as possible. Messages left after 4 pm will be answered the following business day.   You may also send us a message via MyChart. We typically respond to MyChart messages within 1-2 business days.  For prescription refills, please ask your  pharmacy to contact our office. Our fax number is [phone].  If you have an urgent issue when the clinic is closed that cannot wait until the next business day, you can page your doctor at the number below.    Please note that while we do our best to be available for urgent issues outside of office hours, we are not available 24/7.   If you have an urgent issue and are unable to reach us, you may choose to seek medical care at your doctor's office, retail clinic, urgent care center, or emergency room.  If you have a medical emergency, please immediately call 911 or go to the emergency department.  Pager Numbers  - Dr. Kowalski: [phone]  - Dr. Moye: [phone]  - Dr. Stewart: [phone]  In the event of inclement weather, please call our main line at [phone] for an update on the status of any delays or closures.  Dermatology Medication Tips: Please keep the boxes that topical medications come in in order to help keep track of the instructions about where and how to use these. Pharmacies typically print the medication instructions only on the boxes and not directly on the medication tubes.   If your medication is too expensive, please contact our office at [phone] option 4 or send us a message through MyChart.   We are unable to tell what your co-pay for medications will be in advance as this is different depending on your insurance coverage. However,   we may be able to find a substitute medication at lower cost or fill out paperwork to get insurance to cover a needed medication.   If a prior authorization is required to get your medication covered by your insurance company, please allow us 1-2 business days to complete this process.  Drug prices often vary depending on where the prescription is filled and some pharmacies may offer cheaper prices.  The website www.goodrx.com contains coupons for medications through different pharmacies. The prices here do not  account for what the cost may be with help from insurance (it may be cheaper with your insurance), but the website can give you the price if you did not use any insurance.  - You can print the associated coupon and take it with your prescription to the pharmacy.  - You may also stop by our office during regular business hours and pick up a GoodRx coupon card.  - If you need your prescription sent electronically to a different pharmacy, notify our office through [place] MyChart or by phone at [phone] option 4.  

## 2020-08-09 NOTE — Progress Notes (Signed)
Follow-Up Visit   Subjective  Maxwell Stanley is a 86 y.o. male who presents for the following: Annual Exam (History of BCC/SCC - TBSE today). The patient presents for Total-Body Skin Exam (TBSE) for skin cancer screening and mole check.  The following portions of the chart were reviewed this encounter and updated as appropriate:   Tobacco  Allergies  Meds  Problems  Med Hx  Surg Hx  Fam Hx     Review of Systems:  No other skin or systemic complaints except as noted in HPI or Assessment and Plan.  Objective  Well appearing patient in no apparent distress; mood and affect are within normal limits.  A full examination was performed including scalp, head, eyes, ears, nose, lips, neck, chest, axillae, abdomen, back, buttocks, bilateral upper extremities, bilateral lower extremities, hands, feet, fingers, toes, fingernails, and toenails. All findings within normal limits unless otherwise noted below.  Scalp, face (17) Erythematous thin papules/macules with gritty scale.   Scalp/face x 16, left leg x 6 - Total 22 (22) Erythematous keratotic or waxy stuck-on papule or plaque.   Right chest pectoral Healing biopsy site  Left distal calf Healing biopsy site  Left prox calf Healing biopsy site  Left mid pretibial 2.5 cm hyperkeratotic papule  Right lat forehead Well healed excision site [facility])   Assessment & Plan   Lentigines - Scattered tan macules - Due to sun exposure - Benign-appering, observe - Recommend daily broad spectrum sunscreen SPF 30+ to sun-exposed areas, reapply every 2 hours as needed. - Call for any changes  Seborrheic Keratoses - Stuck-on, waxy, tan-brown papules and/or plaques  - Benign-appearing - Discussed benign etiology and prognosis. - Observe - Call for any changes  Melanocytic Nevi - Tan-brown and/or pink-flesh-colored symmetric macules and papules - Benign appearing on exam today - Observation - Call clinic for new or changing  moles - Recommend daily use of broad spectrum spf 30+ sunscreen to sun-exposed areas.   Hemangiomas - Red papules - Discussed benign nature - Observe - Call for any changes  Actinic Damage - Chronic condition, secondary to cumulative UV/sun exposure - diffuse scaly erythematous macules with underlying dyspigmentation - Recommend daily broad spectrum sunscreen SPF 30+ to sun-exposed areas, reapply every 2 hours as needed.  - Staying in the shade or wearing long sleeves, sun glasses (UVA+UVB protection) and wide brim hats (4-inch brim around the entire circumference of the hat) are also recommended for sun protection.  - Call for new or changing lesions.  Skin cancer screening performed today.  AK (actinic keratosis) (17) Scalp, face  Destruction of lesion - Scalp, face Complexity: simple   Destruction method: cryotherapy   Informed consent: discussed and consent obtained   Timeout:  patient name, date of birth, surgical site, and procedure verified Lesion destroyed using liquid nitrogen: Yes   Region frozen until ice ball extended beyond lesion: Yes   Outcome: patient tolerated procedure well with no complications   Post-procedure details: wound care instructions given    Inflamed seborrheic keratosis Scalp/face x 16, left leg x 6 - Total 22  Destruction of lesion - Scalp/face x 16, left leg x 6 - Total 22 Complexity: simple   Destruction method: cryotherapy   Informed consent: discussed and consent obtained   Timeout:  patient name, date of birth, surgical site, and procedure verified Lesion destroyed using liquid nitrogen: Yes   Region frozen until ice ball extended beyond lesion: Yes   Outcome: patient tolerated procedure well with no  complications   Post-procedure details: wound care instructions given    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  Rash Superior buttocks Impending decubitus  ulcer Start Protopic 0.1% oint bid Increased walking.  May be difficult to improve.  tacrolimus (PROTOPIC) 0.1 % ointment - Mid Back Apply topically 2 (two) times daily.  Basal cell carcinoma (BCC) of skin of left lower extremity -biopsy-proven  Left distal calf  Destruction of lesion Complexity: extensive   Destruction method: electrodesiccation and curettage   Informed consent: discussed and consent obtained   Timeout:  patient name, date of birth, surgical site, and procedure verified Procedure prep:  Patient was prepped and draped in usual sterile fashion Prep type:  Isopropyl alcohol Anesthesia: the lesion was anesthetized in a standard fashion   Anesthetic:  1% lidocaine w/ epinephrine 1-100,000 buffered w/ 8.4% NaHCO3 Curettage performed in three different directions: Yes   Electrodesiccation performed over the curetted area: Yes   Final wound size (cm):  2.5 Hemostasis achieved with:  pressure and aluminum chloride Outcome: patient tolerated procedure well with no complications   Post-procedure details: sterile dressing applied and wound care instructions given   Dressing type: bandage and petrolatum    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  Destruction of lesion Complexity: extensive   Destruction method: electrodesiccation and curettage   Informed consent: discussed and consent obtained   Timeout:  patient name, date of birth, surgical site, and procedure verified Procedure prep:  Patient was prepped and draped in usual sterile fashion Prep type:  Isopropyl alcohol Anesthesia: the lesion was anesthetized in a standard fashion   Anesthetic:  1% lidocaine w/ epinephrine 1-100,000 buffered w/ 8.4% NaHCO3 Curettage performed in three different directions: Yes   Electrodesiccation performed over the curetted area: Yes   Final wound size (cm):  2.2 Hemostasis achieved with:  pressure and aluminum chloride Outcome: patient tolerated procedure well with no  complications   Post-procedure details: sterile dressing applied and wound care instructions given   Dressing type: bandage and petrolatum    Left mid pretibial -consistent with SCC in situ 2.2 cm  Destruction of lesion Complexity: simple   Destruction method: cryotherapy   Informed consent: discussed and consent obtained   Timeout:  patient name, date of birth, surgical site, and procedure verified Lesion destroyed using liquid nitrogen: Yes   Region frozen until ice ball extended beyond lesion: Yes   Outcome: patient tolerated procedure well with no complications   Post-procedure details: wound care instructions given    History of basal cell carcinoma (BCC) status post Mohs Right lat forehead Clear. Observe for recurrence. Call clinic for new or changing lesions.  Recommend regular skin exams, daily broad-spectrum spf 30+ sunscreen use, and photoprotection.    Over 45 minutes spent with patient at today's visit and evaluation and treatment.  Return in about 2 months (around 10/09/2020).  I, Ashok Cordia, CMA, am acting as scribe for Sarina Ser, MD . Documentation: I have reviewed the above documentation for accuracy and completeness, and I agree with the above.  Sarina Ser, MD

## 2020-08-13 ENCOUNTER — Telehealth: Payer: Self-pay

## 2020-08-13 NOTE — Telephone Encounter (Signed)
Pt called to make sure Dr Raliegh Ip is aware of all the side effects for Tacrolimus ointment, pt was prescribed to use on his buttock

## 2020-08-21 DIAGNOSIS — Z85828 Personal history of other malignant neoplasm of skin: Secondary | ICD-10-CM

## 2020-08-21 DIAGNOSIS — L905 Scar conditions and fibrosis of skin: Secondary | ICD-10-CM | POA: Diagnosis not present

## 2020-08-21 DIAGNOSIS — C44511 Basal cell carcinoma of skin of breast: Secondary | ICD-10-CM | POA: Diagnosis not present

## 2020-08-21 HISTORY — DX: Personal history of other malignant neoplasm of skin: Z85.828

## 2020-08-22 ENCOUNTER — Other Ambulatory Visit: Payer: Self-pay | Admitting: Internal Medicine

## 2020-08-22 DIAGNOSIS — I10 Essential (primary) hypertension: Secondary | ICD-10-CM

## 2020-08-25 ENCOUNTER — Other Ambulatory Visit: Payer: Self-pay | Admitting: Internal Medicine

## 2020-08-25 DIAGNOSIS — E1121 Type 2 diabetes mellitus with diabetic nephropathy: Secondary | ICD-10-CM

## 2020-08-25 NOTE — Telephone Encounter (Signed)
Requested Prescriptions  Pending Prescriptions Disp Refills  . JANUVIA 100 MG tablet [Pharmacy Med Name: JANUVIA TAB '100MG'$ ] 90 tablet 1    Sig: TAKE 1 TABLET DAILY     Endocrinology:  Diabetes - DPP-4 Inhibitors Passed - 08/25/2020 12:33 PM      Passed - HBA1C is between 0 and 7.9 and within 180 days    Hemoglobin A1C  Date Value Ref Range Status  06/06/2020 6.4 (A) 4.0 - 5.6 % Final   Hgb A1c MFr Bld  Date Value Ref Range Status  04/04/2020 6.8 (H) 4.8 - 5.6 % Final    Comment:    (NOTE) Pre diabetes:          5.7%-6.4%  Diabetes:              >6.4%  Glycemic control for   <7.0% adults with diabetes          Passed - Cr in normal range and within 360 days    Creatinine, Ser  Date Value Ref Range Status  04/04/2020 1.20 0.61 - 1.24 mg/dL Final         Passed - Valid encounter within last 6 months    Recent Outpatient Visits          2 months ago Type II diabetes mellitus with complication [facility])   [facility] Glean Hess, MD   6 months ago Annual physical exam   [facility] Glean Hess, MD   11 months ago Type II diabetes mellitus with complication [facility])   [facility], [facility]   1 year ago Type II diabetes mellitus with complication [facility])   [facility]an Hess, MD   1 year ago Type II diabetes mellitus with complication [facility])   [facility]an [facility], MD      Future Appointments            In 1 month [name] Jesse Sans, MD [facility], [place]   In 1 month Ralene Bathe, MD [place]   In 5 months [name], Jesse Sans, MD [facility], [facility]

## 2020-08-27 ENCOUNTER — Ambulatory Visit: Payer: Medicare HMO | Admitting: Dermatology

## 2020-08-31 ENCOUNTER — Other Ambulatory Visit: Payer: Self-pay | Admitting: Internal Medicine

## 2020-08-31 DIAGNOSIS — N401 Enlarged prostate with lower urinary tract symptoms: Secondary | ICD-10-CM

## 2020-09-14 MED ORDER — FINASTERIDE 5 MG PO TABS: 5.0000 mg | ORAL_TABLET | Freq: Every day | ORAL | 0 refills | Status: DC

## 2020-09-14 MED ORDER — TAMSULOSIN HCL 0.4 MG PO CAPS: ORAL_CAPSULE | ORAL | 0 refills | Status: DC

## 2020-09-14 NOTE — Addendum Note (Signed)
Addended by: Matilde Sprang on: 09/14/2020 04:23 PM   Modules accepted: Orders

## 2020-09-14 NOTE — Telephone Encounter (Signed)
Pt called and stated that the pharmacy advised him that they cant release medications to mail until they get the RX approval from the provider/ shows meds were sent to pharmacy on 8.26.22/ but they told pt they havent received it / please advise   finasteride (PROSCAR) 5 MG tablet   tamsulosin (FLOMAX) 0.4 MG CAPS capsule   [facility] [place] [facility] [address]  [place], [place] [address]  Phone:  [phone]  Fax:  [phone]

## 2020-09-14 NOTE — Telephone Encounter (Signed)
[facility] called and spoke to [place] about the refill requests. She says she sees where the medications were requested, but not showing where they were received. I advised they will be sent back in e-scribe.

## 2020-09-15 ENCOUNTER — Other Ambulatory Visit: Payer: Self-pay | Admitting: Internal Medicine

## 2020-09-15 DIAGNOSIS — I10 Essential (primary) hypertension: Secondary | ICD-10-CM

## 2020-09-15 NOTE — Telephone Encounter (Signed)
Requested Prescriptions  Pending Prescriptions Disp Refills  . losartan (COZAAR) 100 MG tablet [Pharmacy Med Name: LOSARTAN TAB '100MG'$ ] 90 tablet 3    Sig: TAKE 1/2 TABLET TWICE A DAY     Cardiovascular:  Angiotensin Receptor Blockers Passed - 09/15/2020 10:08 AM      Passed - Cr in normal range and within 180 days    Creatinine, Ser  Date Value Ref Range Status  04/04/2020 1.20 0.61 - 1.24 mg/dL Final         Passed - K in normal range and within 180 days    Potassium  Date Value Ref Range Status  04/04/2020 4.2 3.5 - 5.1 mmol/L Final  01/28/2011 4.2 3.5 - 5.1 mmol/L Final    Comment:    POTASSIUM - Slight hemolysis, interpret results with  - caution.          Passed - Patient is not pregnant      Passed - Last BP in normal range    BP Readings from Last 1 Encounters:  06/06/20 136/82         Passed - Valid encounter within last 6 months    Recent Outpatient Visits          3 months ago Type II diabetes mellitus with complication [facility])   [facility] Glean [facility], MD   7 months ago Annual physical exam   [facility] Glean Hess, MD   11 months ago Type II diabetes mellitus with complication [facility])   [facility] Glean [facility], MD   1 year ago Type II diabetes mellitus with complication [facility])   [facility] Glean [facility], MD   1 year ago Type II diabetes mellitus with complication [facility])   [facility] Glean Hess, MD      Future Appointments            In 3 weeks [name] Jesse Sans, MD [facility], [place]   In 2 months Ralene Bathe, MD [place]   In 5 months [name] Jesse Sans, MD [facility], [facility]

## 2020-10-06 ENCOUNTER — Other Ambulatory Visit: Payer: Self-pay | Admitting: Internal Medicine

## 2020-10-06 DIAGNOSIS — K2100 Gastro-esophageal reflux disease with esophagitis, without bleeding: Secondary | ICD-10-CM

## 2020-10-06 DIAGNOSIS — N401 Enlarged prostate with lower urinary tract symptoms: Secondary | ICD-10-CM

## 2020-10-06 DIAGNOSIS — R3912 Poor urinary stream: Secondary | ICD-10-CM

## 2020-10-06 DIAGNOSIS — I10 Essential (primary) hypertension: Secondary | ICD-10-CM

## 2020-10-09 ENCOUNTER — Other Ambulatory Visit: Payer: Self-pay

## 2020-10-09 ENCOUNTER — Ambulatory Visit (INDEPENDENT_AMBULATORY_CARE_PROVIDER_SITE_OTHER): Payer: Medicare HMO | Admitting: Internal Medicine

## 2020-10-09 ENCOUNTER — Encounter: Payer: Self-pay | Admitting: Internal Medicine

## 2020-10-09 VITALS — BP 118/76 | HR 66 | Temp 98.0°F | Ht 74.0 in | Wt 181.0 lb

## 2020-10-09 DIAGNOSIS — N183 Chronic kidney disease, stage 3 unspecified: Secondary | ICD-10-CM | POA: Diagnosis not present

## 2020-10-09 DIAGNOSIS — E118 Type 2 diabetes mellitus with unspecified complications: Secondary | ICD-10-CM | POA: Diagnosis not present

## 2020-10-09 DIAGNOSIS — E1122 Type 2 diabetes mellitus with diabetic chronic kidney disease: Secondary | ICD-10-CM

## 2020-10-09 DIAGNOSIS — I10 Essential (primary) hypertension: Secondary | ICD-10-CM | POA: Diagnosis not present

## 2020-10-09 DIAGNOSIS — Z48817 Encounter for surgical aftercare following surgery on the skin and subcutaneous tissue: Secondary | ICD-10-CM | POA: Diagnosis not present

## 2020-10-09 NOTE — Patient Instructions (Signed)
Hold amlodipine and monitor BP  Reduce metformin to 2 per day

## 2020-10-09 NOTE — Progress Notes (Signed)
Date:  10/09/2020   Name:  Maxwell Stanley   DOB:  [birth year]   MRN:  [id number]   Chief Complaint: Diabetes (Last BS this AM 179) and Hypertension  Diabetes He presents for his follow-up diabetic visit. He has type 2 diabetes mellitus. His disease course has been stable. Pertinent negatives for hypoglycemia include no dizziness, headaches, nervousness/anxiousness or tremors. Pertinent negatives for diabetes include no chest pain, no fatigue, no polydipsia and no polyuria. Pertinent negatives for diabetic complications include no CVA or PVD. Current diabetic treatments: metformin and januvia.  Hypertension This is a chronic problem. The problem is controlled. Associated symptoms include peripheral edema. Pertinent negatives include no chest pain, headaches, palpitations or shortness of breath. There are no associated agents to hypertension. Past treatments include beta blockers, calcium channel blockers, diuretics and angiotensin blockers. The current treatment provides significant improvement. Compliance problems: having some low readings and feeling lightheaded at time.  Hypertensive end-organ damage includes kidney disease and CAD/MI. There is no history of CVA or PVD.  CKD - stable stage 3 disease.  Released from nephrology follow up.  Lab Results  Component Value Date   CREATININE 1.20 04/04/2020   BUN 27 (H) 04/04/2020   NA 138 04/04/2020   K 4.2 04/04/2020   CL 106 04/04/2020   CO2 23 04/04/2020   Lab Results  Component Value Date   CHOL 123 02/15/2020   HDL 35 (L) 02/15/2020   LDLCALC 56 02/15/2020   TRIG 195 (H) 02/15/2020   CHOLHDL 3.5 02/15/2020   Lab Results  Component Value Date   TSH 2.920 02/02/2019   Lab Results  Component Value Date   HGBA1C 6.4 (A) 06/06/2020   Lab Results  Component Value Date   WBC 5.4 04/04/2020   HGB 11.8 (L) 04/04/2020   HCT 34.3 (L) 04/04/2020   MCV 94.0 04/04/2020   PLT 196 04/04/2020   Lab Results  Component Value Date    ALT 18 04/04/2020   AST 26 04/04/2020   ALKPHOS 61 04/04/2020   BILITOT 0.8 04/04/2020     Review of Systems  Constitutional:  Negative for appetite change, fatigue and unexpected weight change.  Eyes:  Negative for visual disturbance.  Respiratory:  Negative for cough, shortness of breath and wheezing.   Cardiovascular:  Negative for chest pain, palpitations and leg swelling.  Gastrointestinal:  Negative for abdominal pain and blood in stool.  Endocrine: Negative for polydipsia and polyuria.  Genitourinary:  Negative for dysuria and hematuria.  Musculoskeletal:  Positive for arthralgias (left knee).  Skin:  Positive for wound (chest wall skin cancer site almost healed). Negative for color change and rash.  Neurological:  Positive for light-headedness. Negative for dizziness, tremors, numbness and headaches.  Psychiatric/Behavioral:  Negative for dysphoric mood and sleep disturbance. The patient is not nervous/anxious.    Patient Active Problem List   Diagnosis Date Noted   Total knee replacement status 04/16/2020   Acquired thrombophilia ([place]) 02/15/2020   Primary osteoarthritis of both knees 05/19/2019   Gastroesophageal reflux disease with esophagitis 02/02/2019   Personal history of other malignant neoplasm of skin 12/17/2018   Persistent atrial fibrillation ([place]) 10/29/2018   Reducible right inguinal hernia 09/16/2018   DDD (degenerative disc disease), lumbar 12/23/2017   Primary osteoarthritis of right hip 12/23/2017   Onychomycosis of toenail 08/19/2017   Allergic rhinitis 08/08/2016   Carpal tunnel syndrome on left 12/12/2015   Chronic maxillary sinusitis 04/09/2015   Type II diabetes mellitus with complication (  [place]) 11/28/2014   Chronic low back pain 09/21/2014   Abnormal prostate specific antigen 04/17/2014   Enlarged prostate with lower urinary tract symptoms (LUTS) 04/17/2014   CKD stage 3 secondary to diabetes ([place]) 04/17/2014   Hyperlipidemia associated with type  2 diabetes mellitus ([place]) 04/17/2014   ED (erectile dysfunction) of organic origin 04/17/2014   Essential (primary) hypertension 04/17/2014   Back muscle spasm 04/17/2014   Degenerative arthritis of finger 04/17/2014   Breast development in males 04/17/2014   Leg varices 04/17/2014   EDEMA 08/24/2009    Allergies  Allergen Reactions   Codeine Other (See Comments)    Made his "Wild"   Ceftriaxone Rash   Penicillin G Rash    Did it involve swelling of the face/tongue/throat, SOB, or low BP? No Did it involve sudden or severe rash/hives, skin peeling, or any reaction on the inside of your mouth or nose? No Did you need to seek medical attention at a hospital or doctor's office? No When did it last happen?       If all above answers are "NO", may proceed with cephalosporin use.  IgE = 35 (WNL) on 01/18/2020     Past Surgical History:  Procedure Laterality Date   ANKLE ARTHROSCOPY WITH OPEN REDUCTION INTERNAL FIXATION (ORIF) Left 1998   COLONOSCOPY     ETHMOIDECTOMY Left 03/08/2020   Procedure: TOTAL ETHMOIDECTOMY;  Surgeon: Margaretha Sheffield, MD;  Location: [place];  Service: ENT;  Laterality: Left;   FRONTAL SINUS EXPLORATION Left 03/08/2020   Procedure: FRONTAL SINUS EXPLORATION;  Surgeon: Margaretha Sheffield, MD;  Location: [place];  Service: ENT;  Laterality: Left;   HEMORRHOID SURGERY     HERNIA REPAIR Left 1660   umbilical and left inguinal   IMAGE GUIDED SINUS SURGERY N/A 03/08/2020   Procedure: IMAGE GUIDED SINUS SURGERY;  Surgeon: Margaretha Sheffield, MD;  Location: [place];  Service: ENT;  Laterality: N/A;  placed disk on OR charge nurse desk 2-21 kp PLACED 2ND DISK ON OR CHARGE NURSE DESK 02-28-20 KP 3RD DISK ON OR CHARGE NURSE DESK 2-25- [place] Right 11/12/2018   Procedure: HERNIA REPAIR INGUINAL ADULT;  Surgeon: Fredirick Maudlin, MD;  Location: [facility];  Service: General;  Laterality: Right;   INGUINAL HERNIA REPAIR Left 2012    KNEE ARTHROPLASTY Right 04/16/2020   Procedure: COMPUTER ASSISTED TOTAL KNEE ARTHROPLASTY;  Surgeon: Dereck Leep, MD;  Location: [facility];  Service: Orthopedics;  Laterality: Right;   KNEE ARTHROSCOPY Right 2006   MAXILLARY ANTROSTOMY Left 03/08/2020   Procedure: MAXILLARY ANTROSTOMY WITH TISSUE REMOVAL;  Surgeon: Margaretha Sheffield, MD;  Location: [place];  Service: ENT;  Laterality: Left;   SEPTOPLASTY N/A 03/08/2020   Procedure: SEPTOPLASTY;  Surgeon: Margaretha Sheffield, MD;  Location: [place];  Service: ENT;  Laterality: N/A;   skin cancer resection Right 04/07/2018   ON hand.   THUMB ARTHROSCOPY Right 2013   tendon flap to joint   TONSILLECTOMY     UPPER GI ENDOSCOPY      Social History   Tobacco Use   Smoking status: Never   Smokeless tobacco: Never  Vaping Use   Vaping Use: Never used  Substance Use Topics   Alcohol use: No    Alcohol/week: 0.0 standard drinks   Drug use: No     Medication list has been reviewed and updated.  Current Meds  Medication Sig   acetaminophen (TYLENOL) 500 MG tablet Take 1,000 mg  by mouth daily.   amLODipine (NORVASC) 2.5 MG tablet TAKE 1 TABLET DAILY   atenolol (TENORMIN) 50 MG tablet TAKE 1 TABLET AT BEDTIME   celecoxib (CELEBREX) 200 MG capsule Take 200 mg by mouth 2 (two) times daily.   cholecalciferol (VITAMIN D) 1000 UNITS tablet Take 1,000 Units by mouth daily.   clotrimazole (LOTRIMIN) 1 % cream Apply 1 application topically 2 (two) times daily. (Patient taking differently: Apply 1 application topically 2 (two) times daily as needed (fungus).)   docusate sodium (COLACE) 100 MG capsule Take 200 mg by mouth daily.    ELIQUIS 5 MG TABS tablet Take 1 tablet (5 mg total) by mouth 2 (two) times daily.   finasteride (PROSCAR) 5 MG tablet TAKE 1 TABLET DAILY   fluticasone (FLONASE) 50 MCG/ACT nasal spray Place 2 sprays into both nostrils daily.   glucose blood (ONE TOUCH ULTRA TEST) test strip Use to test blood sugar once  daily.   hydrochlorothiazide (MICROZIDE) 12.5 MG capsule TAKE 1 CAPSULE DAILY   JANUVIA 100 MG tablet TAKE 1 TABLET DAILY (Patient taking differently: 50 mg.)   losartan (COZAAR) 100 MG tablet TAKE 1/2 TABLET TWICE A DAY   Melatonin 10 MG CAPS Take 10 mg by mouth at bedtime.   metFORMIN (GLUCOPHAGE-XR) 500 MG 24 hr tablet Take 2 tablets (1,000 mg total) by mouth in the morning and at bedtime. (Patient taking differently: Take 1,000 mg by mouth in the morning and at bedtime. Takes 3 at bedtime)   Multiple Vitamin (MULTIVITAMIN WITH MINERALS) TABS tablet Take 0.5 tablets by mouth in the morning and at bedtime.   OneTouch Delica Lancets 71I MISC 1 each by Does not apply route daily. Use to test blood sugar once daily.   pantoprazole (PROTONIX) 40 MG tablet TAKE 1 TABLET TWICE A DAY   Probiotic Product (PROBIOTIC DAILY PO) Take 1 tablet by mouth daily.   rosuvastatin (CRESTOR) 20 MG tablet TAKE 1 TABLET AT BEDTIME   tacrolimus (PROTOPIC) 0.1 % ointment Apply topically 2 (two) times daily.   tamsulosin (FLOMAX) 0.4 MG CAPS capsule TAKE 1 CAPSULE DAILY AFTER BREAKFAST (Patient taking differently: as needed. TAKE 1 CAPSULE DAILY AFTER BREAKFAST)   triamcinolone cream (KENALOG) 0.1 % Apply 1 application topically daily as needed for irritation.    PHQ 2/9 Scores 10/09/2020 06/06/2020 02/15/2020 11/23/2019  PHQ - 2 Score 0 0 4 0  PHQ- 9 Score 2 0 6 -    GAD 7 : Generalized Anxiety Score 10/09/2020 06/06/2020 02/15/2020 07/13/2019  Nervous, Anxious, on Edge 0 0 0 0  Control/stop worrying 0 0 0 0  Worry too much - different things 0 0 0 0  Trouble relaxing 0 0 0 0  Restless 0 0 0 0  Easily annoyed or irritable 0 0 0 0  Afraid - awful might happen 0 0 0 0  Total GAD 7 Score 0 0 0 0  Anxiety Difficulty - - Not difficult at all Not difficult at all    BP Readings from Last 3 Encounters:  10/09/20 118/76  06/06/20 136/82  04/17/20 138/71    Physical Exam Vitals and nursing note reviewed.   Constitutional:      General: He is not in acute distress.    Appearance: He is well-developed.  HENT:     Head: Normocephalic and atraumatic.  Cardiovascular:     Rate and Rhythm: Normal rate and regular rhythm.     Pulses: Normal pulses.     Heart sounds: No murmur  heard. Pulmonary:     Effort: Pulmonary effort is normal. No respiratory distress.     Breath sounds: No wheezing or rhonchi.  Musculoskeletal:     Right lower leg: No edema.     Left lower leg: No edema.  Skin:    General: Skin is warm and dry.     Capillary Refill: Capillary refill takes less than 2 seconds.     Findings: No rash.     Comments: Chest wall wound not examined   Neurological:     General: No focal deficit present.     Mental Status: He is alert and oriented to person, place, and time.  Psychiatric:        Mood and Affect: Mood normal.        Behavior: Behavior normal.    Wt Readings from Last 3 Encounters:  10/09/20 181 lb (82.1 kg)  06/06/20 180 lb (81.6 kg)  04/04/20 188 lb 8 oz (85.5 kg)    BP 118/76   Pulse 66   Temp 98 F (36.7 C) (Oral)   Ht 6\' 2"  (1.88 m)   Wt 181 lb (82.1 kg)   SpO2 99%   BMI 23.24 kg/m   Assessment and Plan: 1. Essential (primary) hypertension Clinically stable exam with well controlled BP. Tolerating medications but having some lightheadedness and low BP. Hold amlodipine for now and monitor Pt to continue current regimen and low sodium diet; benefits of regular exercise as able discussed.  2. Type II diabetes mellitus with complication ([place]) Clinically stable by exam and report without s/s of hypoglycemia. DM complicated by hypertension and dyslipidemia. Tolerating medications well without side effects or other concerns. Can reduce metformin to 2 per day. - Hemoglobin A1c  3. CKD stage 3 secondary to diabetes ([place]) Continue to monitor - Basic metabolic panel   Partially dictated using Editor, commissioning. Any errors are unintentional.  Halina Maidens, MD [facility]  10/09/2020

## 2020-10-10 LAB — BASIC METABOLIC PANEL
BUN/Creatinine Ratio: 16 (ref 10–24)
BUN: 26 mg/dL (ref 8–27)
CO2: 21 mmol/L (ref 20–29)
Calcium: 9.7 mg/dL (ref 8.6–10.2)
Chloride: 100 mmol/L (ref 96–106)
Creatinine, Ser: 1.59 mg/dL — ABNORMAL HIGH (ref 0.76–1.27)
Glucose: 128 mg/dL — ABNORMAL HIGH (ref 70–99)
Potassium: 4.7 mmol/L (ref 3.5–5.2)
Sodium: 136 mmol/L (ref 134–144)
eGFR: 43 mL/min/{1.73_m2} — ABNORMAL LOW (ref >59)

## 2020-10-10 LAB — HEMOGLOBIN A1C
Est. average glucose Bld gHb Est-mCnc: 146 mg/dL
Hgb A1c MFr Bld: 6.7 % — ABNORMAL HIGH (ref 4.8–5.6)

## 2020-10-12 ENCOUNTER — Encounter: Payer: Self-pay | Admitting: General Surgery

## 2020-10-18 ENCOUNTER — Ambulatory Visit: Payer: Medicare HMO | Admitting: Dermatology

## 2020-10-22 DIAGNOSIS — M5416 Radiculopathy, lumbar region: Secondary | ICD-10-CM | POA: Diagnosis not present

## 2020-10-22 DIAGNOSIS — M48062 Spinal stenosis, lumbar region with neurogenic claudication: Secondary | ICD-10-CM | POA: Diagnosis not present

## 2020-10-23 DIAGNOSIS — R0602 Shortness of breath: Secondary | ICD-10-CM | POA: Diagnosis not present

## 2020-10-23 DIAGNOSIS — E785 Hyperlipidemia, unspecified: Secondary | ICD-10-CM | POA: Diagnosis not present

## 2020-10-23 DIAGNOSIS — E1122 Type 2 diabetes mellitus with diabetic chronic kidney disease: Secondary | ICD-10-CM | POA: Diagnosis not present

## 2020-10-23 DIAGNOSIS — N183 Chronic kidney disease, stage 3 unspecified: Secondary | ICD-10-CM | POA: Diagnosis not present

## 2020-10-23 DIAGNOSIS — I6523 Occlusion and stenosis of bilateral carotid arteries: Secondary | ICD-10-CM | POA: Diagnosis not present

## 2020-10-23 DIAGNOSIS — E1169 Type 2 diabetes mellitus with other specified complication: Secondary | ICD-10-CM | POA: Diagnosis not present

## 2020-10-23 DIAGNOSIS — I480 Paroxysmal atrial fibrillation: Secondary | ICD-10-CM | POA: Diagnosis not present

## 2020-10-23 DIAGNOSIS — I10 Essential (primary) hypertension: Secondary | ICD-10-CM | POA: Diagnosis not present

## 2020-10-23 DIAGNOSIS — E782 Mixed hyperlipidemia: Secondary | ICD-10-CM | POA: Diagnosis not present

## 2020-11-06 DIAGNOSIS — Z08 Encounter for follow-up examination after completed treatment for malignant neoplasm: Secondary | ICD-10-CM | POA: Diagnosis not present

## 2020-11-06 DIAGNOSIS — C44722 Squamous cell carcinoma of skin of right lower limb, including hip: Secondary | ICD-10-CM | POA: Diagnosis not present

## 2020-11-06 DIAGNOSIS — C44612 Basal cell carcinoma of skin of right upper limb, including shoulder: Secondary | ICD-10-CM | POA: Diagnosis not present

## 2020-11-06 DIAGNOSIS — Z85828 Personal history of other malignant neoplasm of skin: Secondary | ICD-10-CM | POA: Diagnosis not present

## 2020-11-06 DIAGNOSIS — C44329 Squamous cell carcinoma of skin of other parts of face: Secondary | ICD-10-CM | POA: Diagnosis not present

## 2020-11-06 DIAGNOSIS — C44712 Basal cell carcinoma of skin of right lower limb, including hip: Secondary | ICD-10-CM | POA: Diagnosis not present

## 2020-11-06 DIAGNOSIS — L814 Other melanin hyperpigmentation: Secondary | ICD-10-CM | POA: Diagnosis not present

## 2020-11-06 DIAGNOSIS — L821 Other seborrheic keratosis: Secondary | ICD-10-CM | POA: Diagnosis not present

## 2020-11-06 DIAGNOSIS — D485 Neoplasm of uncertain behavior of skin: Secondary | ICD-10-CM | POA: Diagnosis not present

## 2020-11-06 DIAGNOSIS — L570 Actinic keratosis: Secondary | ICD-10-CM | POA: Diagnosis not present

## 2020-11-06 DIAGNOSIS — C44719 Basal cell carcinoma of skin of left lower limb, including hip: Secondary | ICD-10-CM | POA: Diagnosis not present

## 2020-11-19 DIAGNOSIS — I480 Paroxysmal atrial fibrillation: Secondary | ICD-10-CM | POA: Diagnosis not present

## 2020-11-19 DIAGNOSIS — I5189 Other ill-defined heart diseases: Secondary | ICD-10-CM

## 2020-11-19 DIAGNOSIS — I6523 Occlusion and stenosis of bilateral carotid arteries: Secondary | ICD-10-CM | POA: Diagnosis not present

## 2020-11-19 DIAGNOSIS — R0602 Shortness of breath: Secondary | ICD-10-CM | POA: Diagnosis not present

## 2020-11-19 DIAGNOSIS — R42 Dizziness and giddiness: Secondary | ICD-10-CM | POA: Diagnosis not present

## 2020-11-19 HISTORY — DX: Other ill-defined heart diseases: I51.89

## 2020-11-20 DIAGNOSIS — D485 Neoplasm of uncertain behavior of skin: Secondary | ICD-10-CM | POA: Diagnosis not present

## 2020-11-20 DIAGNOSIS — L989 Disorder of the skin and subcutaneous tissue, unspecified: Secondary | ICD-10-CM | POA: Diagnosis not present

## 2020-11-20 DIAGNOSIS — L570 Actinic keratosis: Secondary | ICD-10-CM | POA: Diagnosis not present

## 2020-11-23 DIAGNOSIS — D485 Neoplasm of uncertain behavior of skin: Secondary | ICD-10-CM | POA: Diagnosis not present

## 2020-11-26 ENCOUNTER — Ambulatory Visit: Payer: Medicare HMO

## 2020-11-26 DIAGNOSIS — N183 Chronic kidney disease, stage 3 unspecified: Secondary | ICD-10-CM | POA: Diagnosis not present

## 2020-11-26 DIAGNOSIS — E785 Hyperlipidemia, unspecified: Secondary | ICD-10-CM | POA: Diagnosis not present

## 2020-11-26 DIAGNOSIS — I480 Paroxysmal atrial fibrillation: Secondary | ICD-10-CM | POA: Diagnosis not present

## 2020-11-26 DIAGNOSIS — E1169 Type 2 diabetes mellitus with other specified complication: Secondary | ICD-10-CM | POA: Diagnosis not present

## 2020-11-26 DIAGNOSIS — E1122 Type 2 diabetes mellitus with diabetic chronic kidney disease: Secondary | ICD-10-CM | POA: Diagnosis not present

## 2020-11-26 DIAGNOSIS — I10 Essential (primary) hypertension: Secondary | ICD-10-CM | POA: Diagnosis not present

## 2020-12-04 ENCOUNTER — Other Ambulatory Visit: Payer: Self-pay | Admitting: Internal Medicine

## 2020-12-04 DIAGNOSIS — E1169 Type 2 diabetes mellitus with other specified complication: Secondary | ICD-10-CM

## 2020-12-04 DIAGNOSIS — R3912 Poor urinary stream: Secondary | ICD-10-CM

## 2020-12-04 DIAGNOSIS — E785 Hyperlipidemia, unspecified: Secondary | ICD-10-CM

## 2020-12-04 DIAGNOSIS — N401 Enlarged prostate with lower urinary tract symptoms: Secondary | ICD-10-CM

## 2020-12-06 NOTE — Telephone Encounter (Signed)
Requested Prescriptions  Pending Prescriptions Disp Refills  . rosuvastatin (CRESTOR) 20 MG tablet [Pharmacy Med Name: ROSUVASTATIN TAB 20MG ] 90 tablet 0    Sig: TAKE 1 TABLET AT BEDTIME     Cardiovascular:  Antilipid - Statins Failed - 12/04/2020  9:30 PM      Failed - HDL in normal range and within 360 days    HDL  Date Value Ref Range Status  02/15/2020 35 (L) >39 mg/dL Final         Failed - Triglycerides in normal range and within 360 days    Triglycerides  Date Value Ref Range Status  02/15/2020 195 (H) 0 - 149 mg/dL Final         Passed - Total Cholesterol in normal range and within 360 days    Cholesterol, Total  Date Value Ref Range Status  02/15/2020 123 100 - 199 mg/dL Final         Passed - LDL in normal range and within 360 days    LDL Chol Calc (NIH)  Date Value Ref Range Status  02/15/2020 56 0 - 99 mg/dL Final         Passed - Patient is not pregnant      Passed - Valid encounter within last 12 months    Recent Outpatient Visits          1 month ago Essential (primary) hypertension   [facility] Glean Hess, MD   6 months ago Type II diabetes mellitus with complication [facility])   [facility] Glean Hess, MD   9 months ago Annual physical exam   [facility] Glean Hess, MD   1 year ago Type II diabetes mellitus with complication [facility])   [facility] Glean Hess, MD   1 year ago Type II diabetes mellitus with complication [facility])   [facility] Glean Hess, MD      Future Appointments            In 2 months [name] Jesse Sans, MD [facility], [place]           . finasteride (PROSCAR) 5 MG tablet [Pharmacy Med Name: FINASTERIDE  TAB 5MG ] 90 tablet 0    Sig: TAKE 1 TABLET DAILY     Urology: 5-alpha Reductase Inhibitors Passed - 12/04/2020  9:30 PM      Passed - Valid encounter within last 12 months    Recent Outpatient Visits          1 month ago Essential  (primary) hypertension   [facility] Glean Hess, MD   6 months ago Type II diabetes mellitus with complication [facility])   [facility] Glean Hess, MD   9 months ago Annual physical exam   [facility] Glean [facility], [facility]   1 year ago Type II diabetes mellitus with complication [facility])   [facility] Glean Hess, MD   1 year ago Type II diabetes mellitus with complication [facility])   [facility] Glean Hess, MD      Future Appointments            In 2 months [name] Jesse Sans, MD [facility]           . tamsulosin (FLOMAX) 0.4 MG CAPS capsule [Pharmacy Med Name: TAMSULOSIN CAP 0.4MG ] 90 capsule 0    Sig: TAKE 1 CAPSULE DAILY AFTER BREAKFAST  Urology: Alpha-Adrenergic Blocker Passed - 12/04/2020  9:30 PM      Passed - Last BP in normal range    BP Readings from Last 1 Encounters:  10/09/20 118/76         Passed - Valid encounter within last 12 months    Recent Outpatient Visits          1 month ago Essential (primary) hypertension   [facility] Glean Hess, MD   6 months ago Type II diabetes mellitus with complication [facility])   [facility] Glean Hess, MD   9 months ago Annual physical exam   [facility] Glean Hess, MD   1 year ago Type II diabetes mellitus with complication [facility])   [facility] Glean Hess, MD   1 year ago Type II diabetes mellitus with complication [facility])   [facility] Glean Hess, MD      Future Appointments            In 2 months [name] Jesse Sans, MD [facility]

## 2020-12-11 ENCOUNTER — Ambulatory Visit: Payer: Medicare HMO | Admitting: Dermatology

## 2020-12-17 DIAGNOSIS — M47816 Spondylosis without myelopathy or radiculopathy, lumbar region: Secondary | ICD-10-CM | POA: Diagnosis not present

## 2020-12-17 DIAGNOSIS — M5126 Other intervertebral disc displacement, lumbar region: Secondary | ICD-10-CM | POA: Diagnosis not present

## 2020-12-17 DIAGNOSIS — M5416 Radiculopathy, lumbar region: Secondary | ICD-10-CM | POA: Diagnosis not present

## 2020-12-17 DIAGNOSIS — M48062 Spinal stenosis, lumbar region with neurogenic claudication: Secondary | ICD-10-CM | POA: Diagnosis not present

## 2020-12-18 ENCOUNTER — Encounter: Payer: Self-pay | Admitting: Internal Medicine

## 2020-12-18 DIAGNOSIS — H2513 Age-related nuclear cataract, bilateral: Secondary | ICD-10-CM | POA: Diagnosis not present

## 2020-12-18 DIAGNOSIS — E119 Type 2 diabetes mellitus without complications: Secondary | ICD-10-CM | POA: Diagnosis not present

## 2020-12-18 DIAGNOSIS — H5213 Myopia, bilateral: Secondary | ICD-10-CM | POA: Diagnosis not present

## 2020-12-18 DIAGNOSIS — H52223 Regular astigmatism, bilateral: Secondary | ICD-10-CM | POA: Diagnosis not present

## 2020-12-18 DIAGNOSIS — Z7984 Long term (current) use of oral hypoglycemic drugs: Secondary | ICD-10-CM | POA: Diagnosis not present

## 2020-12-18 DIAGNOSIS — H524 Presbyopia: Secondary | ICD-10-CM | POA: Diagnosis not present

## 2020-12-18 LAB — HM DIABETES EYE EXAM

## 2020-12-25 DIAGNOSIS — C44329 Squamous cell carcinoma of skin of other parts of face: Secondary | ICD-10-CM | POA: Diagnosis not present

## 2020-12-25 DIAGNOSIS — L989 Disorder of the skin and subcutaneous tissue, unspecified: Secondary | ICD-10-CM | POA: Diagnosis not present

## 2020-12-25 DIAGNOSIS — C44612 Basal cell carcinoma of skin of right upper limb, including shoulder: Secondary | ICD-10-CM | POA: Diagnosis not present

## 2021-01-02 ENCOUNTER — Other Ambulatory Visit: Payer: Self-pay | Admitting: Internal Medicine

## 2021-01-02 ENCOUNTER — Ambulatory Visit (INDEPENDENT_AMBULATORY_CARE_PROVIDER_SITE_OTHER): Payer: Medicare HMO

## 2021-01-02 DIAGNOSIS — E118 Type 2 diabetes mellitus with unspecified complications: Secondary | ICD-10-CM

## 2021-01-02 DIAGNOSIS — Z0000 Encounter for general adult medical examination without abnormal findings: Secondary | ICD-10-CM | POA: Diagnosis not present

## 2021-01-02 MED ORDER — METFORMIN HCL ER 500 MG PO TB24
ORAL_TABLET | ORAL | 1 refills | Status: DC
Start: 2021-01-02 — End: 2021-03-19

## 2021-01-02 NOTE — Patient Instructions (Signed)
Maxwell Stanley , Thank you for taking time to come for your Medicare Wellness Visit. I appreciate your ongoing commitment to your health goals. Please review the following plan we discussed and let me know if I can assist you in the future.   Screening recommendations/referrals: Colonoscopy: no longer required Recommended yearly ophthalmology/optometry visit for glaucoma screening and checkup Recommended yearly dental visit for hygiene and checkup  Vaccinations: Influenza vaccine: done 10/05/20 Pneumococcal vaccine: done 10/26/13 Tdap vaccine: due Shingles vaccine: done 08/12/17 & 10/22/17   Covid-19: done 01/22/19, 02/13/19, 10/20/19, 04/07/20 & 10/05/20  Advanced directives: Please bring a copy of your health care power of attorney and living will to the office at your convenience.   Conditions/risks identified: Recommend drinking 6-8 glasses of water per day   Next appointment: Follow up in one year for your annual wellness visit.   Preventive Care 84 Years and Older, Male Preventive care refers to lifestyle choices and visits with your health care provider that can promote health and wellness. What does preventive care include? A yearly physical exam. This is also called an annual well check. Dental exams once or twice a year. Routine eye exams. Ask your health care provider how often you should have your eyes checked. Personal lifestyle choices, including: Daily care of your teeth and gums. Regular physical activity. Eating a healthy diet. Avoiding tobacco and drug use. Limiting alcohol use. Practicing safe sex. Taking low doses of aspirin every day. Taking vitamin and mineral supplements as recommended by your health care provider. What happens during an annual well check? The services and screenings done by your health care provider during your annual well check will depend on your age, overall health, lifestyle risk factors, and family history of disease. Counseling  Your health  care provider may ask you questions about your: Alcohol use. Tobacco use. Drug use. Emotional well-being. Home and relationship well-being. Sexual activity. Eating habits. History of falls. Memory and ability to understand (cognition). Work and work Statistician. Screening  You may have the following tests or measurements: Height, weight, and BMI. Blood pressure. Lipid and cholesterol levels. These may be checked every 5 years, or more frequently if you are over 84 years old. Skin check. Lung cancer screening. You may have this screening every year starting at age 27 if you have a 30-pack-year history of smoking and currently smoke or have quit within the past 15 years. Fecal occult blood test (FOBT) of the stool. You may have this test every year starting at age 84. Flexible sigmoidoscopy or colonoscopy. You may have a sigmoidoscopy every 5 years or a colonoscopy every 10 years starting at age 84. Prostate cancer screening. Recommendations will vary depending on your family history and other risks. Hepatitis C blood test. Hepatitis B blood test. Sexually transmitted disease (STD) testing. Diabetes screening. This is done by checking your blood sugar (glucose) after you have not eaten for a while (fasting). You may have this done every 1-3 years. Abdominal aortic aneurysm (AAA) screening. You may need this if you are a current or former smoker. Osteoporosis. You may be screened starting at age 84 if you are at high risk. Talk with your health care provider about your test results, treatment options, and if necessary, the need for more tests. Vaccines  Your health care provider may recommend certain vaccines, such as: Influenza vaccine. This is recommended every year. Tetanus, diphtheria, and acellular pertussis (Tdap, Td) vaccine. You may need a Td booster every 10 years. Zoster vaccine. You  may need this after age 84. Pneumococcal 13-valent conjugate (PCV13) vaccine. One dose is  recommended after age 84. Pneumococcal polysaccharide (PPSV23) vaccine. One dose is recommended after age 84. Talk to your health care provider about which screenings and vaccines you need and how often you need them. This information is not intended to replace advice given to you by your health care provider. Make sure you discuss any questions you have with your health care provider. Document Released: 01/19/2015 Document Revised: 09/12/2015 Document Reviewed: 10/24/2014 Elsevier Interactive Patient Education  2017 [employer] Prevention in the Home Falls can cause injuries. They can happen to people of all ages. There are many things you can do to make your home safe and to help prevent falls. What can I do on the outside of my home? Regularly fix the edges of walkways and driveways and fix any cracks. Remove anything that might make you trip as you walk through a door, such as a raised step or threshold. Trim any bushes or trees on the path to your home. Use bright outdoor lighting. Clear any walking paths of anything that might make someone trip, such as rocks or tools. Regularly check to see if handrails are loose or broken. Make sure that both sides of any steps have handrails. Any raised decks and porches should have guardrails on the edges. Have any leaves, snow, or ice cleared regularly. Use sand or salt on walking paths during winter. Clean up any spills in your garage right away. This includes oil or grease spills. What can I do in the bathroom? Use night lights. Install grab bars by the toilet and in the tub and shower. Do not use towel bars as grab bars. Use non-skid mats or decals in the tub or shower. If you need to sit down in the shower, use a plastic, non-slip stool. Keep the floor dry. Clean up any water that spills on the floor as soon as it happens. Remove soap buildup in the tub or shower regularly. Attach bath mats securely with double-sided non-slip rug  tape. Do not have throw rugs and other things on the floor that can make you trip. What can I do in the bedroom? Use night lights. Make sure that you have a light by your bed that is easy to reach. Do not use any sheets or blankets that are too big for your bed. They should not hang down onto the floor. Have a firm chair that has side arms. You can use this for support while you get dressed. Do not have throw rugs and other things on the floor that can make you trip. What can I do in the kitchen? Clean up any spills right away. Avoid walking on wet floors. Keep items that you use a lot in easy-to-reach places. If you need to reach something above you, use a strong step stool that has a grab bar. Keep electrical cords out of the way. Do not use floor polish or wax that makes floors slippery. If you must use wax, use non-skid floor wax. Do not have throw rugs and other things on the floor that can make you trip. What can I do with my stairs? Do not leave any items on the stairs. Make sure that there are handrails on both sides of the stairs and use them. Fix handrails that are broken or loose. Make sure that handrails are as long as the stairways. Check any carpeting to make sure that it is firmly  attached to the stairs. Fix any carpet that is loose or worn. Avoid having throw rugs at the top or bottom of the stairs. If you do have throw rugs, attach them to the floor with carpet tape. Make sure that you have a light switch at the top of the stairs and the bottom of the stairs. If you do not have them, ask someone to add them for you. What else can I do to help prevent falls? Wear shoes that: Do not have high heels. Have rubber bottoms. Are comfortable and fit you well. Are closed at the toe. Do not wear sandals. If you use a stepladder: Make sure that it is fully opened. Do not climb a closed stepladder. Make sure that both sides of the stepladder are locked into place. Ask someone to  hold it for you, if possible. Clearly mark and make sure that you can see: Any grab bars or handrails. First and last steps. Where the edge of each step is. Use tools that help you move around (mobility aids) if they are needed. These include: Canes. Walkers. Scooters. Crutches. Turn on the lights when you go into a dark area. Replace any light bulbs as soon as they burn out. Set up your furniture so you have a clear path. Avoid moving your furniture around. If any of your floors are uneven, fix them. If there are any pets around you, be aware of where they are. Review your medicines with your doctor. Some medicines can make you feel dizzy. This can increase your chance of falling. Ask your doctor what other things that you can do to help prevent falls. This information is not intended to replace advice given to you by your health care provider. Make sure you discuss any questions you have with your health care provider. Document Released: 10/19/2008 Document Revised: 05/31/2015 Document Reviewed: 01/27/2014 Elsevier Interactive Patient Education  2017 [employer].

## 2021-01-02 NOTE — Progress Notes (Signed)
Subjective:   Maxwell Stanley is a 86 y.o. male who presents for Medicare Annual/Subsequent preventive examination.  Virtual Visit via Telephone Note  I connected with  Maxwell Stanley on 01/02/21 at 10:40 AM EST by telephone and verified that I am speaking with the correct person using two identifiers.  Location: Patient: home Provider: [facility] Persons participating in the virtual visit: [place]   I discussed the limitations, risks, security and privacy concerns of performing an evaluation and management service by telephone and the availability of in person appointments. The patient expressed understanding and agreed to proceed.  Interactive audio and video telecommunications were attempted between this nurse and patient, however failed, due to patient having technical difficulties OR patient did not have access to video capability.  We continued and completed visit with audio only.  Some vital signs may be absent or patient reported.   Clemetine Marker, LPN   Review of Systems     Cardiac Risk Factors include: advanced age (>17men, >75 women);diabetes mellitus;dyslipidemia;male gender;hypertension     Objective:    Today's Vitals   01/02/21 1049  PainSc: 3    There is no height or weight on file to calculate BMI.  Advanced Directives 01/02/2021 04/16/2020 04/04/2020 03/08/2020 01/18/2020 11/23/2019 11/22/2018  Does Patient Have a Medical Advance Directive? Yes Yes Yes Yes Yes Yes Yes  Type of Paramedic of [place];Living will [place];Living will [place];Living will [place];Living will Living will;Healthcare Power of [place];Living will [place];Living will  Does patient want to make changes to medical advance directive? - No - Patient declined - No - Patient declined No - Patient declined - -  Copy of [place] in Chart? No - copy requested No - copy requested No - copy requested No - copy requested No - copy requested No - copy requested Yes - validated most recent copy scanned in chart (See row information)  Would patient like information on creating a medical advance directive? - - - - - - -    Current Medications (verified) Outpatient Encounter Medications as of 01/02/2021  Medication Sig   acetaminophen (TYLENOL) 500 MG tablet Take 1,000 mg by mouth daily.   amLODipine (NORVASC) 2.5 MG tablet TAKE 1 TABLET DAILY   atenolol (TENORMIN) 50 MG tablet TAKE 1 TABLET AT BEDTIME   celecoxib (CELEBREX) 200 MG capsule Take 200 mg by mouth 2 (two) times daily.   chlorhexidine (PERIDEX) 0.12 % solution 15 mLs 2 (two) times daily.   cholecalciferol (VITAMIN D) 1000 UNITS tablet Take 1,000 Units by mouth daily.   clotrimazole (LOTRIMIN) 1 % cream Apply 1 application topically 2 (two) times daily. (Patient taking differently: Apply 1 application topically 2 (two) times daily as needed (fungus).)   docusate sodium (COLACE) 100 MG capsule Take 200 mg by mouth daily.    ELIQUIS 5 MG TABS tablet Take 1 tablet (5 mg total) by mouth 2 (two) times daily.   finasteride (PROSCAR) 5 MG tablet TAKE 1 TABLET DAILY   fluticasone (FLONASE) 50 MCG/ACT nasal spray Place 2 sprays into both nostrils daily.   glucose blood (ONE TOUCH ULTRA TEST) test strip Use to test blood sugar once daily.   JANUVIA 100 MG tablet TAKE 1 TABLET DAILY (Patient taking differently: 50 mg.)   losartan (COZAAR) 100 MG tablet TAKE 1/2 TABLET TWICE A DAY   Melatonin 10 MG CAPS Take  10 mg by mouth at bedtime.   metFORMIN (GLUCOPHAGE-XR) 500 MG 24 hr tablet Take 2 tablets (1,000 mg total) by mouth in the morning and at bedtime. (Patient taking differently: Take 1,000 mg by mouth in the morning and at bedtime. Pt taking 500 mg AM and 1000 mg PM)   Multiple Vitamin (MULTIVITAMIN WITH MINERALS) TABS tablet Take 0.5 tablets by mouth in the morning  and at bedtime.   mupirocin ointment (BACTROBAN) 2 % APPLY TO AFFECTED AREA EVERY DAY   OneTouch Delica Lancets 53I MISC 1 each by Does not apply route daily. Use to test blood sugar once daily.   pantoprazole (PROTONIX) 40 MG tablet TAKE 1 TABLET TWICE A DAY   Probiotic Product (PROBIOTIC DAILY PO) Take 1 tablet by mouth daily.   rosuvastatin (CRESTOR) 20 MG tablet TAKE 1 TABLET AT BEDTIME   tacrolimus (PROTOPIC) 0.1 % ointment Apply topically 2 (two) times daily.   tamsulosin (FLOMAX) 0.4 MG CAPS capsule TAKE 1 CAPSULE DAILY AFTER BREAKFAST   triamcinolone cream (KENALOG) 0.1 % Apply 1 application topically daily as needed for irritation.   [DISCONTINUED] hydrochlorothiazide (MICROZIDE) 12.5 MG capsule TAKE 1 CAPSULE DAILY   No facility-administered encounter medications on file as of 01/02/2021.    Allergies (verified) Codeine, Ceftriaxone, and Penicillin g   History: Past Medical History:  Diagnosis Date   A-fib ([place])    Allergy unknown   Basal cell carcinoma 11/28/2019   R neck post auricular, EDC  NODULAR AND INFILTRATIVE PATTERNS   Basal cell carcinoma 12/21/2019   R lateral calf, treated wiht EDC 01/26/2020   BPH (benign prostatic hypertrophy)    with BOO   Cancer (HCC)    skin cancers   Carpal tunnel syndrome on left    Cataract 11/2018   CKD stage 3 secondary to diabetes [facility])    Complication of anesthesia    DDD (degenerative disc disease), lumbar    Degenerative arthritis of finger    Diabetes mellitus without complication ([place]) 1443   Foot pain, left 04/17/2014   GERD (gastroesophageal reflux disease)    History of basal cell carcinoma (BCC) 08/21/2020   right chest pectoral tx'd with Moh's 08/21/2020   History of squamous cell carcinoma of skin 2021   left ear/Moh's   Hyperlipidemia    Hypertension    Nocturia    Organic impotence    PONV (postoperative nausea and vomiting)    Primary osteoarthritis of both knees    Primary osteoarthritis of right hip     Squamous cell carcinoma of skin 11/28/2019   L ear sup helix  WELL DIFFERENTIATED   Type 2 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)    Urinary frequency    Past Surgical History:  Procedure Laterality Date   ANKLE ARTHROSCOPY WITH OPEN REDUCTION INTERNAL FIXATION (ORIF) Left 1998   COLONOSCOPY     ETHMOIDECTOMY Left 03/08/2020   Procedure: TOTAL ETHMOIDECTOMY;  Surgeon: Margaretha Sheffield, MD;  Location: [place];  Service: ENT;  Laterality: Left;   FRONTAL SINUS EXPLORATION Left 03/08/2020   Procedure: FRONTAL SINUS EXPLORATION;  Surgeon: Margaretha Sheffield, MD;  Location: [place];  Service: ENT;  Laterality: Left;   HEMORRHOID SURGERY     HERNIA REPAIR Left 1540   umbilical and left inguinal   IMAGE GUIDED SINUS SURGERY N/A 03/08/2020   Procedure: IMAGE GUIDED SINUS SURGERY;  Surgeon: Margaretha Sheffield, MD;  Location: [place];  Service: ENT;  Laterality: N/A;  placed disk on  OR charge nurse desk 2-21 kp PLACED 2ND DISK ON OR CHARGE NURSE DESK 02-28-20 KP 3RD DISK ON OR CHARGE NURSE DESK 2-25- KP   INGUINAL HERNIA REPAIR Right 11/12/2018   Procedure: HERNIA REPAIR INGUINAL ADULT;  Surgeon: Fredirick Maudlin, MD;  Location: [facility];  Service: General;  Laterality: Right;   INGUINAL HERNIA REPAIR Left 2012   KNEE ARTHROPLASTY Right 04/16/2020   Procedure: COMPUTER ASSISTED TOTAL KNEE ARTHROPLASTY;  Surgeon: Dereck Leep, MD;  Location: [facility];  Service: Orthopedics;  Laterality: Right;   KNEE ARTHROSCOPY Right 2006   MAXILLARY ANTROSTOMY Left 03/08/2020   Procedure: MAXILLARY ANTROSTOMY WITH TISSUE REMOVAL;  Surgeon: Margaretha Sheffield, MD;  Location: [place];  Service: ENT;  Laterality: Left;   SEPTOPLASTY N/A 03/08/2020   Procedure: SEPTOPLASTY;  Surgeon: Margaretha Sheffield, MD;  Location: [place];  Service: ENT;  Laterality: N/A;   skin cancer resection Right 04/07/2018   ON hand.   THUMB ARTHROSCOPY Right 2013   tendon flap to joint   TONSILLECTOMY      UPPER GI ENDOSCOPY     Family History  Problem Relation Age of Onset   Diabetes Mother    Coronary artery disease Father    Hypertension Father    Hyperlipidemia Father    Leukemia Father    Social History   Socioeconomic History   Marital status: Married    Spouse name: Vaughan Basta   Number of children: 2   Years of education: some college   Highest education level: 12th grade  Occupational History   Occupation: Retired  Tobacco Use   Smoking status: Never   Smokeless tobacco: Never  Scientific laboratory technician Use: Never used  Substance and Sexual Activity   Alcohol use: No    Alcohol/week: 0.0 standard drinks   Drug use: No   Sexual activity: Not Currently    Birth control/protection: None  Other Topics Concern   Not on file  Social History Narrative   Caffeine in moderation. 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   Social Determinants of Health   Financial Resource Strain: Low Risk    Difficulty of Paying Living Expenses: Not hard at all  Food Insecurity: No Food Insecurity   Worried About Charity fundraiser in the Last Year: Never true   Arboriculturist in the Last Year: Never true  Transportation Needs: No Transportation Needs   Lack of Transportation (Medical): No   Lack of Transportation (Non-Medical): No  Physical Activity: Inactive   Days of Exercise per Week: 0 days   Minutes of Exercise per Session: 0 min  Stress: No Stress Concern Present   Feeling of Stress : Not at all  Social Connections: Socially Integrated   Frequency of Communication with Friends and Family: More than three times a week   Frequency of Social Gatherings with Friends and Family: More than three times a week   Attends Religious Services: More than 4 times per year   Active Member of [employer] or Organizations: Yes   Attends Music therapist: More than 4 times per year   Marital Status: Married    Tobacco Counseling Counseling given: Not  Answered   Clinical Intake:  Pre-visit preparation completed: Yes  Pain : 0-10 Pain Score: 3  Pain Type: Chronic pain Pain Location: Back Pain Orientation: Lower Pain Descriptors / Indicators: Aching Pain Onset: More than a month ago Pain Frequency: Constant  Nutritional Risks: None Diabetes: Yes CBG done?: No Did pt. bring in CBG monitor from home?: No  How often do you need to have someone help you when you read instructions, pamphlets, or other written materials from your doctor or pharmacy?: 1 - Never  Nutrition Risk Assessment:  Has the patient had any N/V/D within the last 2 months?  No  Does the patient have any non-healing wounds?  No  Has the patient had any unintentional weight loss or weight gain?  No   Diabetes:  Is the patient diabetic?  Yes  If diabetic, was a CBG obtained today?  No  Did the patient bring in their glucometer from home?  No  How often do you monitor your CBG's? daily.   Financial Strains and Diabetes Management:  Are you having any financial strains with the device, your supplies or your medication? No .  Does the patient want to be seen by Chronic Care Management for management of their diabetes?  No  Would the patient like to be referred to a Nutritionist or for Diabetic Management?  No   Diabetic Exams:  Diabetic Eye Exam: Completed 12/18/20 negative retinopathy.  Diabetic Foot Exam: Completed 02/15/20.   Interpreter Needed?: No  Information entered by :: Clemetine Marker LPN   Activities of Daily Living In your present state of health, do you have any difficulty performing the following activities: 01/02/2021 01/01/2021  Hearing? N N  Vision? N N  Difficulty concentrating or making decisions? N N  Walking or climbing stairs? N N  Dressing or bathing? N N  Doing errands, shopping? N N  Preparing Food and eating ? N N  Using the Toilet? N N  In the past six months, have you accidently leaked urine? N N  Do you have  problems with loss of bowel control? N N  Managing your Medications? N N  Managing your Finances? N N  Housekeeping or managing your Housekeeping? N N  Some recent data might be hidden    Patient Care Team: Glean Hess, MD as PCP - General (Internal Medicine) Baker Pierini, OD as Consulting Physician (Optometry) Corey Skains, MD as Consulting Physician (Cardiology) Ralene Bathe, MD (Dermatology) Sharlet Salina, MD as Referring Physician (Physical Medicine and Rehabilitation)  Indicate any recent Medical Services you may have received from other than [facility] providers in the past year (date may be approximate).     Assessment:   This is a routine wellness examination for [place].  Hearing/Vision screen Hearing Screening - Comments:: Pt denies hearing difficulty Vision Screening - Comments:: Annual vision screenings done with Dr. Juanda Crumble  Dietary issues and exercise activities discussed: Current Exercise Habits: The patient does not participate in regular exercise at present, Exercise limited by: orthopedic condition(s)   Goals Addressed             This Visit's Progress    DIET - INCREASE WATER INTAKE   Not on track    Recommend to drink at least 6-8 8oz glasses of water per day.       Depression Screen PHQ 2/9 Scores 01/02/2021 10/09/2020 06/06/2020 02/15/2020 11/23/2019 07/13/2019 05/19/2019  PHQ - 2 Score 0 0 0 4 0 0 0  PHQ- 9 Score - 2 0 6 - 3 0    Fall Risk Fall Risk  01/02/2021 01/01/2021 10/09/2020 06/06/2020 02/15/2020  Falls in the past year? 0 0 0 0 0  Number falls in past yr: 0 0 0 - 0  Injury with Fall? 0 0 0 - 0  Risk for fall due to : No Fall Risks - No Fall Risks - -  Risk for fall due to: Comment - - - - -  Follow up Falls prevention discussed - Falls evaluation completed Falls evaluation completed Falls evaluation completed    [place]:  Any stairs in or around the home? Yes  If so, are there any  without handrails? No  Home free of loose throw rugs in walkways, pet beds, electrical cords, etc? Yes  Adequate lighting in your home to reduce risk of falls? Yes   ASSISTIVE DEVICES UTILIZED TO PREVENT FALLS:  Life alert? No  Use of a cane, walker or w/c? No  Grab bars in the bathroom? Yes  Shower chair or bench in shower? Yes  Elevated toilet seat or a handicapped toilet? Yes   TIMED UP AND GO:  Was the test performed? No . Telephonic visit.   Cognitive Function:  Normal cognitive status assessed by direct observation by this Nurse Health Advisor. No abnormalities found.     MMSE - Mini Mental State Exam 08/13/2015  Orientation to time 5  Orientation to Place 5  Registration 3  Attention/ Calculation 5  Recall 3  Language- name 2 objects 2     6CIT Screen 11/23/2019 11/22/2018 09/09/2017 08/25/2016  What Year? 0 points 0 points 0 points 0 points  What month? 0 points 0 points 0 points 0 points  What time? 0 points 0 points 0 points 0 points  Count back from 20 0 points 0 points 0 points 0 points  Months in reverse 0 points 0 points 0 points 0 points  Repeat phrase 0 points 0 points 0 points 0 points  Total Score 0 0 0 0    Immunizations Immunization History  Administered Date(s) Administered   Fluad Quad(high Dose 65+) 09/16/2018, 09/27/2019, 10/05/2020   Influenza, High Dose Seasonal PF 10/13/2016, 09/09/2017   Influenza,inj,Quad PF,6+ Mos 09/26/2014   Influenza-Unspecified 09/26/2014, 10/13/2016   PFIZER(Purple Top)SARS-COV-2 Vaccination 01/22/2019, 02/13/2019, 10/20/2019, 04/07/2020   Pfizer Covid-19 Vaccine Bivalent Booster 82yrs & up 10/05/2020   Pneumococcal Conjugate-13 10/26/2013   Pneumococcal Polysaccharide-23 01/31/2011   Tdap 10/03/2010   Zoster Recombinat (Shingrix) 08/12/2017, 10/22/2017   Zoster, Live 04/16/2000    TDAP status: Due, Education has been provided regarding the importance of this vaccine. Advised may receive this vaccine at local  pharmacy or Health Dept. Aware to provide a copy of the vaccination record if obtained from local pharmacy or Health Dept. Verbalized acceptance and understanding.  Flu Vaccine status: Up to date  Pneumococcal vaccine status: Up to date  Covid-19 vaccine status: Completed vaccines  Qualifies for Shingles Vaccine? Yes   Zostavax completed Yes   Shingrix Completed?: Yes  Screening Tests Health Maintenance  Topic Date Due   TETANUS/TDAP  10/02/2020   FOOT EXAM  02/14/2021   HEMOGLOBIN A1C  04/09/2021   OPHTHALMOLOGY EXAM  12/18/2021   Pneumonia Vaccine 76+ Years old  Completed   INFLUENZA VACCINE  Completed   COVID-19 Vaccine  Completed   Zoster Vaccines- Shingrix  Completed   HPV VACCINES  Aged Out    Health Maintenance  Health Maintenance Due  Topic Date Due   TETANUS/TDAP  10/02/2020    Colorectal cancer screening: No longer required.   Lung Cancer Screening: (Low Dose CT Chest recommended if Age 36-80 years, 30 pack-year currently smoking OR have quit w/in 15years.) does not  qualify.    Additional Screening:  Hepatitis C Screening: does not qualify  Vision Screening: Recommended annual ophthalmology exams for early detection of glaucoma and other disorders of the eye. Is the patient up to date with their annual eye exam?  Yes  Who is the provider or what is the name of the office in which the patient attends annual eye exams? Dr. Mallie Mussel.   Dental Screening: Recommended annual dental exams for proper oral hygiene  Community Resource Referral / Chronic Care Management: CRR required this visit?  No   CCM required this visit?  No      Plan:     I have personally reviewed and noted the following in the patients chart:   Medical and social history Use of alcohol, tobacco or illicit drugs  Current medications and supplements including opioid prescriptions. Patient is not currently taking opioid prescriptions. Functional ability and status Nutritional  status Physical activity Advanced directives List of other physicians Hospitalizations, surgeries, and ER visits in previous 12 months Vitals Screenings to include cognitive, depression, and falls Referrals and appointments  In addition, I have reviewed and discussed with patient certain preventive protocols, quality metrics, and best practice recommendations. A written personalized care plan for preventive services as well as general preventive health recommendations were provided to patient.     Clemetine Marker, LPN   [id number]   Nurse Notes: pt requests refill on Metformin sent to [facility] mail order pharmacy. Pt is currently taking 500 mg AM and 1000 mg PM. Thank you.

## 2021-01-07 DIAGNOSIS — C490 Malignant neoplasm of connective and soft tissue of head, face and neck: Secondary | ICD-10-CM | POA: Diagnosis not present

## 2021-01-15 DIAGNOSIS — D0471 Carcinoma in situ of skin of right lower limb, including hip: Secondary | ICD-10-CM | POA: Diagnosis not present

## 2021-01-15 DIAGNOSIS — L989 Disorder of the skin and subcutaneous tissue, unspecified: Secondary | ICD-10-CM | POA: Diagnosis not present

## 2021-01-22 DIAGNOSIS — D485 Neoplasm of uncertain behavior of skin: Secondary | ICD-10-CM | POA: Diagnosis not present

## 2021-01-22 DIAGNOSIS — C4442 Squamous cell carcinoma of skin of scalp and neck: Secondary | ICD-10-CM | POA: Diagnosis not present

## 2021-01-22 DIAGNOSIS — Z48817 Encounter for surgical aftercare following surgery on the skin and subcutaneous tissue: Secondary | ICD-10-CM | POA: Diagnosis not present

## 2021-02-19 ENCOUNTER — Encounter: Payer: Self-pay | Admitting: Internal Medicine

## 2021-02-19 ENCOUNTER — Other Ambulatory Visit: Payer: Self-pay

## 2021-02-19 ENCOUNTER — Ambulatory Visit (INDEPENDENT_AMBULATORY_CARE_PROVIDER_SITE_OTHER): Payer: Medicare HMO | Admitting: Internal Medicine

## 2021-02-19 VITALS — BP 124/60 | HR 72 | Ht 74.0 in | Wt 180.0 lb

## 2021-02-19 DIAGNOSIS — I10 Essential (primary) hypertension: Secondary | ICD-10-CM

## 2021-02-19 DIAGNOSIS — E118 Type 2 diabetes mellitus with unspecified complications: Secondary | ICD-10-CM | POA: Diagnosis not present

## 2021-02-19 DIAGNOSIS — Z0000 Encounter for general adult medical examination without abnormal findings: Secondary | ICD-10-CM

## 2021-02-19 DIAGNOSIS — I4819 Other persistent atrial fibrillation: Secondary | ICD-10-CM | POA: Diagnosis not present

## 2021-02-19 DIAGNOSIS — E1169 Type 2 diabetes mellitus with other specified complication: Secondary | ICD-10-CM

## 2021-02-19 DIAGNOSIS — K2100 Gastro-esophageal reflux disease with esophagitis, without bleeding: Secondary | ICD-10-CM

## 2021-02-19 DIAGNOSIS — D6869 Other thrombophilia: Secondary | ICD-10-CM

## 2021-02-19 DIAGNOSIS — R3912 Poor urinary stream: Secondary | ICD-10-CM

## 2021-02-19 DIAGNOSIS — N183 Chronic kidney disease, stage 3 unspecified: Secondary | ICD-10-CM | POA: Diagnosis not present

## 2021-02-19 DIAGNOSIS — E1122 Type 2 diabetes mellitus with diabetic chronic kidney disease: Secondary | ICD-10-CM | POA: Diagnosis not present

## 2021-02-19 DIAGNOSIS — E785 Hyperlipidemia, unspecified: Secondary | ICD-10-CM | POA: Diagnosis not present

## 2021-02-19 DIAGNOSIS — N401 Enlarged prostate with lower urinary tract symptoms: Secondary | ICD-10-CM

## 2021-02-19 LAB — POCT URINALYSIS DIPSTICK
Bilirubin, UA: NEGATIVE
Blood, UA: NEGATIVE
Glucose, UA: NEGATIVE
Ketones, UA: NEGATIVE
Leukocytes, UA: NEGATIVE
Nitrite, UA: NEGATIVE
Protein, UA: NEGATIVE
Spec Grav, UA: 1.02 (ref 1.010–1.025)
Urobilinogen, UA: 0.2 E.U./dL
pH, UA: 6 (ref 5.0–8.0)

## 2021-02-19 MED ORDER — ROSUVASTATIN CALCIUM 20 MG PO TABS: 20.0000 mg | ORAL_TABLET | Freq: Every day | ORAL | 1 refills | Status: DC

## 2021-02-19 MED ORDER — TAMSULOSIN HCL 0.4 MG PO CAPS: 0.4000 mg | ORAL_CAPSULE | Freq: Every day | ORAL | 1 refills | Status: DC

## 2021-02-19 MED ORDER — SITAGLIPTIN PHOSPHATE 100 MG PO TABS: 100.0000 mg | ORAL_TABLET | Freq: Every day | ORAL | 1 refills | Status: DC

## 2021-02-19 MED ORDER — ONETOUCH ULTRA VI STRP: ORAL_STRIP | 12 refills | Status: AC

## 2021-02-19 NOTE — Progress Notes (Signed)
Date:  02/19/2021   Name:  Maxwell Stanley   DOB:  1934/04/06   MRN:  [id number]   Chief Complaint: Annual Exam (MICRO, Foot Exam.) Maxwell Stanley is a 87 y.o. male who presents today for his Complete Annual Exam. He feels fairly well. He reports exercising - not at this time. He reports he is sleeping well. Scheduled for physical therapy March 6th.  Colonoscopy: 2011 aged out  Immunization History  Administered Date(s) Administered   Fluad Quad(high Dose 65+) 09/16/2018, 09/27/2019, 10/05/2020   Influenza, High Dose Seasonal PF 10/13/2016, 09/09/2017   Influenza,inj,Quad PF,6+ Mos 09/26/2014   Influenza-Unspecified 09/26/2014, 10/13/2016   PFIZER(Purple Top)SARS-COV-2 Vaccination 01/22/2019, 02/13/2019, 10/20/2019, 04/07/2020   Pfizer Covid-19 Vaccine Bivalent Booster 65yr & up 10/05/2020   Pneumococcal Conjugate-13 10/26/2013   Pneumococcal Polysaccharide-23 01/31/2011   Tdap 10/03/2010   Zoster Recombinat (Shingrix) 08/12/2017, 10/22/2017   Zoster, Live 04/16/2000    Lab Results  Component Value Date   PSA1 0.7 02/15/2020   PSA1 0.6 02/02/2019   PSA1 0.9 12/22/2017   PSA 1.83 04/10/2017    Hypertension This is a chronic problem. The problem is controlled. Pertinent negatives include no chest pain, headaches, palpitations or shortness of breath. Past treatments include calcium channel blockers, beta blockers and angiotensin blockers.  Diabetes He presents for his follow-up diabetic visit. He has type 2 diabetes mellitus. Pertinent negatives for hypoglycemia include no dizziness, headaches or nervousness/anxiousness. Pertinent negatives for diabetes include no chest pain and no fatigue. Symptoms are stable. Current diabetic treatment includes oral agent (monotherapy) (Celesta Gentile. An ACE inhibitor/angiotensin II receptor blocker is being taken. Eye exam is current.  Gastroesophageal Reflux He complains of heartburn. He reports no abdominal pain, no chest pain, no  choking or no wheezing. This is a recurrent problem. The problem occurs rarely. Pertinent negatives include no fatigue. He has tried a PPI for the symptoms.  Hyperlipidemia This is a chronic problem. The problem is controlled. Pertinent negatives include no chest pain, myalgias or shortness of breath. Current antihyperlipidemic treatment includes statins. The current treatment provides significant improvement of lipids.   Lab Results  Component Value Date   NA 136 10/09/2020   K 4.7 10/09/2020   CO2 21 10/09/2020   GLUCOSE 128 (H) 10/09/2020   BUN 26 10/09/2020   CREATININE 1.59 (H) 10/09/2020   CALCIUM 9.7 10/09/2020   EGFR 43 (L) 10/09/2020   GFRNONAA >60 04/04/2020   Lab Results  Component Value Date   CHOL 123 02/15/2020   HDL 35 (L) 02/15/2020   LDLCALC 56 02/15/2020   TRIG 195 (H) 02/15/2020   CHOLHDL 3.5 02/15/2020   Lab Results  Component Value Date   TSH 2.920 02/02/2019   Lab Results  Component Value Date   HGBA1C 6.7 (H) 10/09/2020   Lab Results  Component Value Date   WBC 5.4 04/04/2020   HGB 11.8 (L) 04/04/2020   HCT 34.3 (L) 04/04/2020   MCV 94.0 04/04/2020   PLT 196 04/04/2020   Lab Results  Component Value Date   ALT 18 04/04/2020   AST 26 04/04/2020   ALKPHOS 61 04/04/2020   BILITOT 0.8 04/04/2020   No results found for: 25OHVITD2, 25OHVITD3, VD25OH   Review of Systems  Constitutional:  Negative for appetite change, chills, diaphoresis, fatigue and unexpected weight change.  HENT:  Negative for hearing loss, tinnitus, trouble swallowing and voice change.   Eyes:  Negative for visual disturbance.  Respiratory:  Negative for choking, shortness of breath  and wheezing.   Cardiovascular:  Negative for chest pain, palpitations and leg swelling.  Gastrointestinal:  Positive for heartburn. Negative for abdominal pain, blood in stool, constipation and diarrhea.  Genitourinary:  Negative for difficulty urinating, dysuria and frequency.  Musculoskeletal:   Positive for arthralgias. Negative for back pain and myalgias.  Skin:  Positive for wound (MOHS surgery on scalp). Negative for color change and rash.  Neurological:  Negative for dizziness, syncope and headaches.  Hematological:  Negative for adenopathy.  Psychiatric/Behavioral:  Negative for dysphoric mood and sleep disturbance. The patient is not nervous/anxious.    Patient Active Problem List   Diagnosis Date Noted   Total knee replacement status 04/16/2020   Acquired thrombophilia ([place]) 02/15/2020   Primary osteoarthritis of both knees 05/19/2019   Gastroesophageal reflux disease with esophagitis 02/02/2019   Personal history of other malignant neoplasm of skin 12/17/2018   Persistent atrial fibrillation ([place]) 10/29/2018   Reducible right inguinal hernia 09/16/2018   DDD (degenerative disc disease), lumbar 12/23/2017   Primary osteoarthritis of right hip 12/23/2017   Onychomycosis of toenail 08/19/2017   Allergic rhinitis 08/08/2016   Carpal tunnel syndrome on left 12/12/2015   Chronic maxillary sinusitis 04/09/2015   Type II diabetes mellitus with complication ([place]) 92/11/9415   Chronic low back pain 09/21/2014   Abnormal prostate specific antigen 04/17/2014   Enlarged prostate with lower urinary tract symptoms (LUTS) 04/17/2014   CKD stage 3 secondary to diabetes ([place]) 04/17/2014   Hyperlipidemia associated with type 2 diabetes mellitus ([place]) 04/17/2014   ED (erectile dysfunction) of organic origin 04/17/2014   Essential (primary) hypertension 04/17/2014   Back muscle spasm 04/17/2014   Degenerative arthritis of finger 04/17/2014   Breast development in males 04/17/2014   Leg varices 04/17/2014   EDEMA 08/24/2009    Allergies  Allergen Reactions   Codeine Other (See Comments)    Made his "Wild"   Ceftriaxone Rash   Penicillin G Rash    Did it involve swelling of the face/tongue/throat, SOB, or low BP? No Did it involve sudden or severe rash/hives, skin peeling, or  any reaction on the inside of your mouth or nose? No Did you need to seek medical attention at a hospital or doctor's office? No When did it last happen?       If all above answers are "NO", may proceed with cephalosporin use.  IgE = 35 (WNL) on 01/18/2020     Past Surgical History:  Procedure Laterality Date   ANKLE ARTHROSCOPY WITH OPEN REDUCTION INTERNAL FIXATION (ORIF) Left 1998   COLONOSCOPY     ETHMOIDECTOMY Left 03/08/2020   Procedure: TOTAL ETHMOIDECTOMY;  Surgeon: Margaretha Sheffield, MD;  Location: [facility];  Service: ENT;  Laterality: Left;   FRONTAL SINUS EXPLORATION Left 03/08/2020   Procedure: FRONTAL SINUS EXPLORATION;  Surgeon: Margaretha Sheffield, MD;  Location: [facility];  Service: ENT;  Laterality: Left;   HEMORRHOID SURGERY     HERNIA REPAIR Left 4081   umbilical and left inguinal   IMAGE GUIDED SINUS SURGERY N/A 03/08/2020   Procedure: IMAGE GUIDED SINUS SURGERY;  Surgeon: Margaretha Sheffield, MD;  Location: [facility];  Service: ENT;  Laterality: N/A;  placed disk on OR charge nurse desk 2-21 kp PLACED 2ND DISK ON OR CHARGE NURSE DESK 02-28-20 KP 3RD DISK ON OR CHARGE NURSE DESK 2-25- [place] Right 11/12/2018   Procedure: HERNIA REPAIR INGUINAL ADULT;  Surgeon: Fredirick Maudlin, MD;  Location: [facility];  Service:  General;  Laterality: Right;   INGUINAL HERNIA REPAIR Left 2012   KNEE ARTHROPLASTY Right 04/16/2020   Procedure: COMPUTER ASSISTED TOTAL KNEE ARTHROPLASTY;  Surgeon: Dereck Leep, MD;  Location: [facility];  Service: Orthopedics;  Laterality: Right;   KNEE ARTHROSCOPY Right 2006   MAXILLARY ANTROSTOMY Left 03/08/2020   Procedure: MAXILLARY ANTROSTOMY WITH TISSUE REMOVAL;  Surgeon: Margaretha Sheffield, MD;  Location: [facility];  Service: ENT;  Laterality: Left;   SEPTOPLASTY N/A 03/08/2020   Procedure: SEPTOPLASTY;  Surgeon: Margaretha Sheffield, MD;  Location: [place];  Service: ENT;  Laterality: N/A;   skin cancer  resection Right 04/07/2018   ON hand.   THUMB ARTHROSCOPY Right 2013   tendon flap to joint   TONSILLECTOMY     UPPER GI ENDOSCOPY      Social History   Tobacco Use   Smoking status: Never   Smokeless tobacco: Never  Vaping Use   Vaping Use: Never used  Substance Use Topics   Alcohol use: No    Alcohol/week: 0.0 standard drinks   Drug use: No     Medication list has been reviewed and updated.  Current Meds  Medication Sig   acetaminophen (TYLENOL) 500 MG tablet Take 1,000 mg by mouth daily.   amLODipine (NORVASC) 2.5 MG tablet TAKE 1 TABLET DAILY   atenolol (TENORMIN) 50 MG tablet TAKE 1 TABLET AT BEDTIME   cholecalciferol (VITAMIN D) 1000 UNITS tablet Take 1,000 Units by mouth daily.   clotrimazole (LOTRIMIN) 1 % cream Apply 1 application topically 2 (two) times daily. (Patient taking differently: Apply 1 application topically 2 (two) times daily as needed (fungus).)   docusate sodium (COLACE) 100 MG capsule Take 200 mg by mouth daily.    ELIQUIS 5 MG TABS tablet Take 1 tablet (5 mg total) by mouth 2 (two) times daily.   finasteride (PROSCAR) 5 MG tablet TAKE 1 TABLET DAILY   fluticasone (FLONASE) 50 MCG/ACT nasal spray Place 2 sprays into both nostrils daily.   glucose blood (ONETOUCH ULTRA) test strip Use as instructed   losartan (COZAAR) 100 MG tablet TAKE 1/2 TABLET TWICE A DAY   Melatonin 10 MG CAPS Take 10 mg by mouth at bedtime.   metFORMIN (GLUCOPHAGE-XR) 500 MG 24 hr tablet Take 2 tablets (1,000 mg total) by mouth daily with breakfast AND 1 tablet (500 mg total) every evening. (Patient taking differently: Take 1 tablet by mouth daily with breakfast AND 2 tablets every evening.)   Multiple Vitamin (MULTIVITAMIN WITH MINERALS) TABS tablet Take 0.5 tablets by mouth in the morning and at bedtime.   mupirocin ointment (BACTROBAN) 2 % APPLY TO AFFECTED AREA EVERY DAY   OneTouch Delica Lancets 12W MISC 1 each by Does not apply route daily. Use to test blood sugar once  daily.   pantoprazole (PROTONIX) 40 MG tablet TAKE 1 TABLET TWICE A DAY   Probiotic Product (PROBIOTIC DAILY PO) Take 1 tablet by mouth daily.   tacrolimus (PROTOPIC) 0.1 % ointment Apply topically 2 (two) times daily.   triamcinolone cream (KENALOG) 0.1 % Apply 1 application topically daily as needed for irritation.   [DISCONTINUED] glucose blood (ONE TOUCH ULTRA TEST) test strip Use to test blood sugar once daily.   [DISCONTINUED] JANUVIA 100 MG tablet TAKE 1 TABLET DAILY (Patient taking differently: 50 mg.)   [DISCONTINUED] rosuvastatin (CRESTOR) 20 MG tablet TAKE 1 TABLET AT BEDTIME   [DISCONTINUED] tamsulosin (FLOMAX) 0.4 MG CAPS capsule TAKE 1 CAPSULE DAILY AFTER BREAKFAST  PHQ 2/9 Scores 02/19/2021 01/02/2021 10/09/2020 06/06/2020  PHQ - 2 Score 0 0 0 0  PHQ- 9 Score 3 - 2 0    GAD 7 : Generalized Anxiety Score 02/19/2021 10/09/2020 06/06/2020 02/15/2020  Nervous, Anxious, on Edge 0 0 0 0  Control/stop worrying 0 0 0 0  Worry too much - different things 0 0 0 0  Trouble relaxing 0 0 0 0  Restless 0 0 0 0  Easily annoyed or irritable 0 0 0 0  Afraid - awful might happen 0 0 0 0  Total GAD 7 Score 0 0 0 0  Anxiety Difficulty - - - Not difficult at all    BP Readings from Last 3 Encounters:  02/19/21 124/60  10/09/20 118/76  06/06/20 136/82    Physical Exam Vitals and nursing note reviewed.  Constitutional:      Appearance: Normal appearance. He is well-developed.  HENT:     Head: Normocephalic.     Right Ear: Tympanic membrane, ear canal and external ear normal.     Left Ear: Tympanic membrane, ear canal and external ear normal.     Nose: Nose normal.  Eyes:     Conjunctiva/sclera: Conjunctivae normal.     Pupils: Pupils are equal, round, and reactive to light.  Neck:     Thyroid: No thyromegaly.     Vascular: No carotid bruit.  Cardiovascular:     Rate and Rhythm: Normal rate and regular rhythm.     Heart sounds: Normal heart sounds.  Pulmonary:     Effort:  Pulmonary effort is normal.     Breath sounds: Normal breath sounds. No wheezing.  Chest:  Breasts:    Right: No mass.     Left: No mass.  Abdominal:     General: Bowel sounds are normal.     Palpations: Abdomen is soft.     Tenderness: There is no abdominal tenderness.  Musculoskeletal:        General: Normal range of motion.     Cervical back: Normal range of motion and neck supple.  Lymphadenopathy:     Cervical: No cervical adenopathy.  Skin:    General: Skin is warm and dry.  Neurological:     Mental Status: He is alert and oriented to person, place, and time.     Deep Tendon Reflexes: Reflexes are normal and symmetric.  Psychiatric:        Attention and Perception: Attention normal.        Mood and Affect: Mood normal.        Thought Content: Thought content normal.    Wt Readings from Last 3 Encounters:  02/19/21 180 lb (81.6 kg)  10/09/20 181 lb (82.1 kg)  06/06/20 180 lb (81.6 kg)    BP 124/60    Pulse 72    Ht 6' 2"  (1.88 m)    Wt 180 lb (81.6 kg)    SpO2 98%    BMI 23.11 kg/m   Assessment and Plan: 1. Annual physical exam Normal exam for age. Up to date on screenings and immunizations.  2. Essential (primary) hypertension Clinically stable exam with well controlled BP. Tolerating medications without side effects at this time. Pt to continue current regimen and low sodium diet;  - TSH - POCT urinalysis dipstick  3. Gastroesophageal reflux disease with esophagitis, unspecified whether hemorrhage Symptoms well controlled on daily PPI No red flag signs such as weight loss, n/v, melena Will continue pantoprazole. - CBC with Differential/Platelet  4. Type II diabetes mellitus with complication ([place]) Clinically stable by exam and report without s/s of hypoglycemia. DM complicated by hypertension and dyslipidemia. Tolerating medications well without side effects or other concerns. Having foot pain/mild numbness but no ulcerations - Hemoglobin A1c -  Microalbumin / creatinine urine ratio - sitaGLIPtin (JANUVIA) 100 MG tablet; Take 1 tablet (100 mg total) by mouth daily.  Dispense: 90 tablet; Refill: 1 - glucose blood (ONETOUCH ULTRA) test strip; Use as instructed  Dispense: 100 each; Refill: 12  5. Hyperlipidemia associated with type 2 diabetes mellitus ([place]) Tolerating statin medication without side effects at this time LDL is at goal of < 70 on current dose Continue same therapy without change at this time. - Lipid panel - rosuvastatin (CRESTOR) 20 MG tablet; Take 1 tablet (20 mg total) by mouth at bedtime.  Dispense: 90 tablet; Refill: 1  6. CKD stage 3 secondary to diabetes ([place]) Continue to monitor He wants to resume Celebrex if his renal function allows - Comprehensive metabolic panel  7. Benign prostatic hyperplasia with weak urinary stream DRE deferred.  PSA not recommended - tamsulosin (FLOMAX) 0.4 MG CAPS capsule; Take 1 capsule (0.4 mg total) by mouth daily after supper.  Dispense: 90 capsule; Refill: 1  8. Persistent atrial fibrillation ([place]) Followed by Cardiology. In SR today. On Eliquis, atenolol for rate control.  9. Acquired thrombophilia ([place]) Doing well without bleeding issues   Partially dictated using Editor, commissioning. Any errors are unintentional.  Halina Maidens, MD [facility]  02/19/2021

## 2021-02-20 LAB — CBC WITH DIFFERENTIAL/PLATELET
Basophils Absolute: 0.1 10*3/uL (ref 0.0–0.2)
Basos: 1 %
EOS (ABSOLUTE): 0.4 10*3/uL (ref 0.0–0.4)
Eos: 6 %
Hematocrit: 25.9 % — ABNORMAL LOW (ref 37.5–51.0)
Hemoglobin: 7.5 g/dL — ABNORMAL LOW (ref 13.0–17.7)
Immature Grans (Abs): 0 10*3/uL (ref 0.0–0.1)
Immature Granulocytes: 0 %
Lymphocytes Absolute: 2.2 10*3/uL (ref 0.7–3.1)
Lymphs: 32 %
MCH: 20.2 pg — ABNORMAL LOW (ref 26.6–33.0)
MCHC: 29 g/dL — ABNORMAL LOW (ref 31.5–35.7)
MCV: 70 fL — ABNORMAL LOW (ref 79–97)
Monocytes Absolute: 0.8 10*3/uL (ref 0.1–0.9)
Monocytes: 12 %
Neutrophils Absolute: 3.4 10*3/uL (ref 1.4–7.0)
Neutrophils: 49 %
Platelets: 298 10*3/uL (ref 150–450)
RBC: 3.72 x10E6/uL — ABNORMAL LOW (ref 4.14–5.80)
RDW: 18.4 % — ABNORMAL HIGH (ref 11.6–15.4)
WBC: 6.8 10*3/uL (ref 3.4–10.8)

## 2021-02-20 LAB — COMPREHENSIVE METABOLIC PANEL
ALT: 16 IU/L (ref 0–44)
AST: 19 IU/L (ref 0–40)
Albumin/Globulin Ratio: 2.2 (ref 1.2–2.2)
Albumin: 4.8 g/dL — ABNORMAL HIGH (ref 3.6–4.6)
Alkaline Phosphatase: 65 IU/L (ref 44–121)
BUN/Creatinine Ratio: 15 (ref 10–24)
BUN: 24 mg/dL (ref 8–27)
Bilirubin Total: 0.4 mg/dL (ref 0.0–1.2)
CO2: 22 mmol/L (ref 20–29)
Calcium: 9.8 mg/dL (ref 8.6–10.2)
Chloride: 103 mmol/L (ref 96–106)
Creatinine, Ser: 1.56 mg/dL — ABNORMAL HIGH (ref 0.76–1.27)
Globulin, Total: 2.2 g/dL (ref 1.5–4.5)
Glucose: 153 mg/dL — ABNORMAL HIGH (ref 70–99)
Potassium: 4.7 mmol/L (ref 3.5–5.2)
Sodium: 139 mmol/L (ref 134–144)
Total Protein: 7 g/dL (ref 6.0–8.5)
eGFR: 44 mL/min/{1.73_m2} — ABNORMAL LOW (ref >59)

## 2021-02-20 LAB — MICROALBUMIN / CREATININE URINE RATIO
Creatinine, Urine: 136.5 mg/dL
Microalb/Creat Ratio: 6 mg/g creat (ref 0–29)
Microalbumin, Urine: 8.7 ug/mL

## 2021-02-20 LAB — TSH: TSH: 2.5 u[IU]/mL (ref 0.450–4.500)

## 2021-02-20 LAB — LIPID PANEL
Chol/HDL Ratio: 3.2 ratio (ref 0.0–5.0)
Cholesterol, Total: 123 mg/dL (ref 100–199)
HDL: 39 mg/dL — ABNORMAL LOW (ref >39)
LDL Chol Calc (NIH): 61 mg/dL (ref 0–99)
Triglycerides: 127 mg/dL (ref 0–149)
VLDL Cholesterol Cal: 23 mg/dL (ref 5–40)

## 2021-02-20 LAB — HEMOGLOBIN A1C
Est. average glucose Bld gHb Est-mCnc: 146 mg/dL
Hgb A1c MFr Bld: 6.7 % — ABNORMAL HIGH (ref 4.8–5.6)

## 2021-02-21 ENCOUNTER — Other Ambulatory Visit: Payer: Self-pay | Admitting: Internal Medicine

## 2021-02-21 DIAGNOSIS — D649 Anemia, unspecified: Secondary | ICD-10-CM

## 2021-02-25 ENCOUNTER — Other Ambulatory Visit: Payer: Self-pay

## 2021-02-25 ENCOUNTER — Other Ambulatory Visit (INDEPENDENT_AMBULATORY_CARE_PROVIDER_SITE_OTHER): Payer: Medicare HMO

## 2021-02-25 DIAGNOSIS — Z1211 Encounter for screening for malignant neoplasm of colon: Secondary | ICD-10-CM

## 2021-02-25 DIAGNOSIS — D649 Anemia, unspecified: Secondary | ICD-10-CM

## 2021-02-25 LAB — HEMOCCULT GUIAC POC 1CARD (OFFICE)
Card #2 Fecal Occult Blod, POC: NEGATIVE
Card #3 Fecal Occult Blood, POC: NEGATIVE
Fecal Occult Blood, POC: NEGATIVE

## 2021-02-25 NOTE — Progress Notes (Signed)
error 

## 2021-02-26 DIAGNOSIS — C4442 Squamous cell carcinoma of skin of scalp and neck: Secondary | ICD-10-CM | POA: Diagnosis not present

## 2021-02-28 ENCOUNTER — Encounter: Payer: Self-pay | Admitting: Internal Medicine

## 2021-02-28 ENCOUNTER — Ambulatory Visit (INDEPENDENT_AMBULATORY_CARE_PROVIDER_SITE_OTHER): Payer: Medicare HMO | Admitting: Internal Medicine

## 2021-02-28 ENCOUNTER — Other Ambulatory Visit: Payer: Self-pay

## 2021-02-28 ENCOUNTER — Telehealth: Payer: Self-pay

## 2021-02-28 VITALS — BP 138/64 | HR 61 | Ht 74.0 in | Wt 178.0 lb

## 2021-02-28 DIAGNOSIS — E538 Deficiency of other specified B group vitamins: Secondary | ICD-10-CM | POA: Diagnosis not present

## 2021-02-28 DIAGNOSIS — D649 Anemia, unspecified: Secondary | ICD-10-CM

## 2021-02-28 HISTORY — DX: Anemia, unspecified: D64.9

## 2021-02-28 NOTE — Telephone Encounter (Signed)
Scheduled for 04/22/2021

## 2021-02-28 NOTE — Progress Notes (Signed)
Date:  02/28/2021   Name:  Maxwell Stanley   DOB:  Feb 06, 1934   MRN:  [id number]   Chief Complaint: Anemia  Anemia Presents for follow-up visit. Symptoms include light-headedness. There has been no malaise/fatigue or weight loss. Signs of blood loss that are not present include hematemesis and melena.  Hemoccults neg x 3.  He has started on Iron supplement daily.  He stopped taking Celebrex.  Eliquis has not been stopped.  Lab Results  Component Value Date   NA 139 02/19/2021   K 4.7 02/19/2021   CO2 22 02/19/2021   GLUCOSE 153 (H) 02/19/2021   BUN 24 02/19/2021   CREATININE 1.56 (H) 02/19/2021   CALCIUM 9.8 02/19/2021   EGFR 44 (L) 02/19/2021   GFRNONAA >60 04/04/2020   Lab Results  Component Value Date   CHOL 123 02/19/2021   HDL 39 (L) 02/19/2021   LDLCALC 61 02/19/2021   TRIG 127 02/19/2021   CHOLHDL 3.2 02/19/2021   Lab Results  Component Value Date   TSH 2.500 02/19/2021   Lab Results  Component Value Date   HGBA1C 6.7 (H) 02/19/2021   Lab Results  Component Value Date   WBC 6.8 02/19/2021   HGB 7.5 (L) 02/19/2021   HCT 25.9 (L) 02/19/2021   MCV 70 (L) 02/19/2021   PLT 298 02/19/2021   Lab Results  Component Value Date   ALT 16 02/19/2021   AST 19 02/19/2021   ALKPHOS 65 02/19/2021   BILITOT 0.4 02/19/2021   No results found for: 25OHVITD2, 25OHVITD3, VD25OH   Review of Systems  Constitutional:  Positive for fatigue. Negative for chills, malaise/fatigue and weight loss.  Respiratory:  Negative for chest tightness, shortness of breath and wheezing.   Cardiovascular:  Negative for chest pain and leg swelling.  Gastrointestinal:  Negative for hematemesis and melena.  Musculoskeletal:  Positive for arthralgias.  Neurological:  Positive for light-headedness. Negative for dizziness and headaches.   Patient Active Problem List   Diagnosis Date Noted   Total knee replacement status 04/16/2020   Acquired thrombophilia ([place]) 02/15/2020   Primary  osteoarthritis of both knees 05/19/2019   Gastroesophageal reflux disease with esophagitis 02/02/2019   Personal history of other malignant neoplasm of skin 12/17/2018   Persistent atrial fibrillation ([place]) 10/29/2018   Reducible right inguinal hernia 09/16/2018   DDD (degenerative disc disease), lumbar 12/23/2017   Primary osteoarthritis of right hip 12/23/2017   Onychomycosis of toenail 08/19/2017   Allergic rhinitis 08/08/2016   Carpal tunnel syndrome on left 12/12/2015   Chronic maxillary sinusitis 04/09/2015   Type II diabetes mellitus with complication ([place]) 09/40/7680   Chronic low back pain 09/21/2014   Abnormal prostate specific antigen 04/17/2014   Enlarged prostate with lower urinary tract symptoms (LUTS) 04/17/2014   CKD stage 3 secondary to diabetes ([place]) 04/17/2014   Hyperlipidemia associated with type 2 diabetes mellitus ([place]) 04/17/2014   ED (erectile dysfunction) of organic origin 04/17/2014   Essential (primary) hypertension 04/17/2014   Back muscle spasm 04/17/2014   Degenerative arthritis of finger 04/17/2014   Breast development in males 04/17/2014   Leg varices 04/17/2014   EDEMA 08/24/2009    Allergies  Allergen Reactions   Codeine Other (See Comments)    Made his "Wild"   Ceftriaxone Rash   Penicillin G Rash    Did it involve swelling of the face/tongue/throat, SOB, or low BP? No Did it involve sudden or severe rash/hives, skin peeling, or any reaction on the inside  of your mouth or nose? No Did you need to seek medical attention at a hospital or doctor's office? No When did it last happen?       If all above answers are "NO", may proceed with cephalosporin use.  IgE = 35 (WNL) on 01/18/2020     Past Surgical History:  Procedure Laterality Date   ANKLE ARTHROSCOPY WITH OPEN REDUCTION INTERNAL FIXATION (ORIF) Left 1998   COLONOSCOPY     ETHMOIDECTOMY Left 03/08/2020   Procedure: TOTAL ETHMOIDECTOMY;  Surgeon: Margaretha Sheffield, MD;  Location: [place];  Service: ENT;  Laterality: Left;   FRONTAL SINUS EXPLORATION Left 03/08/2020   Procedure: FRONTAL SINUS EXPLORATION;  Surgeon: Margaretha Sheffield, MD;  Location: [place];  Service: ENT;  Laterality: Left;   HEMORRHOID SURGERY     HERNIA REPAIR Left 4920   umbilical and left inguinal   IMAGE GUIDED SINUS SURGERY N/A 03/08/2020   Procedure: IMAGE GUIDED SINUS SURGERY;  Surgeon: Margaretha Sheffield, MD;  Location: [place];  Service: ENT;  Laterality: N/A;  placed disk on OR charge nurse desk 2-21 kp PLACED 2ND DISK ON OR CHARGE NURSE DESK 02-28-20 KP 3RD DISK ON OR CHARGE NURSE DESK 2-25- [place] Right 11/12/2018   Procedure: HERNIA REPAIR INGUINAL ADULT;  Surgeon: Fredirick Maudlin, MD;  Location: [facility];  Service: General;  Laterality: Right;   INGUINAL HERNIA REPAIR Left 2012   KNEE ARTHROPLASTY Right 04/16/2020   Procedure: COMPUTER ASSISTED TOTAL KNEE ARTHROPLASTY;  Surgeon: Dereck Leep, MD;  Location: [facility];  Service: Orthopedics;  Laterality: Right;   KNEE ARTHROSCOPY Right 2006   MAXILLARY ANTROSTOMY Left 03/08/2020   Procedure: MAXILLARY ANTROSTOMY WITH TISSUE REMOVAL;  Surgeon: Margaretha Sheffield, MD;  Location: [place];  Service: ENT;  Laterality: Left;   SEPTOPLASTY N/A 03/08/2020   Procedure: SEPTOPLASTY;  Surgeon: Margaretha Sheffield, MD;  Location: [place];  Service: ENT;  Laterality: N/A;   skin cancer resection Right 04/07/2018   ON hand.   THUMB ARTHROSCOPY Right 2013   tendon flap to joint   TONSILLECTOMY     UPPER GI ENDOSCOPY      Social History   Tobacco Use   Smoking status: Never   Smokeless tobacco: Never  Vaping Use   Vaping Use: Never used  Substance Use Topics   Alcohol use: No    Alcohol/week: 0.0 standard drinks   Drug use: No     Medication list has been reviewed and updated.  Current Meds  Medication Sig   acetaminophen (TYLENOL) 500 MG tablet Take 1,000 mg by mouth daily.    amLODipine (NORVASC) 2.5 MG tablet TAKE 1 TABLET DAILY   atenolol (TENORMIN) 50 MG tablet TAKE 1 TABLET AT BEDTIME   cholecalciferol (VITAMIN D) 1000 UNITS tablet Take 1,000 Units by mouth daily.   clotrimazole (LOTRIMIN) 1 % cream Apply 1 application topically 2 (two) times daily. (Patient taking differently: Apply 1 application topically 2 (two) times daily as needed (fungus).)   docusate sodium (COLACE) 100 MG capsule Take 200 mg by mouth daily.    ELIQUIS 5 MG TABS tablet Take 1 tablet (5 mg total) by mouth 2 (two) times daily.   Ferrous Sulfate (IRON PO) Take by mouth daily.   finasteride (PROSCAR) 5 MG tablet TAKE 1 TABLET DAILY   fluticasone (FLONASE) 50 MCG/ACT nasal spray Place 2 sprays into both nostrils daily.   glucose blood (ONETOUCH ULTRA) test strip Use as instructed  losartan (COZAAR) 100 MG tablet TAKE 1/2 TABLET TWICE A DAY   Melatonin 10 MG CAPS Take 10 mg by mouth at bedtime.   metFORMIN (GLUCOPHAGE-XR) 500 MG 24 hr tablet Take 2 tablets (1,000 mg total) by mouth daily with breakfast AND 1 tablet (500 mg total) every evening. (Patient taking differently: Take 1 tablet by mouth daily with breakfast AND 2 tablets every evening.)   Multiple Vitamin (MULTIVITAMIN WITH MINERALS) TABS tablet Take 0.5 tablets by mouth in the morning and at bedtime.   mupirocin ointment (BACTROBAN) 2 % APPLY TO AFFECTED AREA EVERY DAY   OneTouch Delica Lancets 81O MISC 1 each by Does not apply route daily. Use to test blood sugar once daily.   pantoprazole (PROTONIX) 40 MG tablet TAKE 1 TABLET TWICE A DAY   Probiotic Product (PROBIOTIC DAILY PO) Take 1 tablet by mouth daily.   rosuvastatin (CRESTOR) 20 MG tablet Take 1 tablet (20 mg total) by mouth at bedtime.   sitaGLIPtin (JANUVIA) 100 MG tablet Take 1 tablet (100 mg total) by mouth daily. (Patient taking differently: Take 50 mg by mouth daily.)   tacrolimus (PROTOPIC) 0.1 % ointment Apply topically 2 (two) times daily.   tamsulosin (FLOMAX) 0.4  MG CAPS capsule Take 1 capsule (0.4 mg total) by mouth daily after supper.   triamcinolone cream (KENALOG) 0.1 % Apply 1 application topically daily as needed for irritation.    PHQ 2/9 Scores 02/28/2021 02/19/2021 01/02/2021 10/09/2020  PHQ - 2 Score 0 0 0 0  PHQ- 9 Score 3 3 - 2    GAD 7 : Generalized Anxiety Score 02/28/2021 02/19/2021 10/09/2020 06/06/2020  Nervous, Anxious, on Edge 0 0 0 0  Control/stop worrying 0 0 0 0  Worry too much - different things 0 0 0 0  Trouble relaxing 0 0 0 0  Restless 0 0 0 0  Easily annoyed or irritable 0 0 0 0  Afraid - awful might happen 0 0 0 0  Total GAD 7 Score 0 0 0 0  Anxiety Difficulty - - - -    BP Readings from Last 3 Encounters:  02/28/21 138/64  02/19/21 124/60  10/09/20 118/76    Physical Exam Constitutional:      Appearance: Normal appearance.  Eyes:     Comments: Pale conjunctiva  Cardiovascular:     Rate and Rhythm: Normal rate. Rhythm irregular.     Pulses: Normal pulses.  Pulmonary:     Effort: Pulmonary effort is normal.     Breath sounds: Normal breath sounds. No wheezing or rhonchi.  Musculoskeletal:     Cervical back: Normal range of motion.     Right lower leg: No edema.     Left lower leg: No edema.  Lymphadenopathy:     Cervical: No cervical adenopathy.  Skin:    General: Skin is warm and dry.  Neurological:     Mental Status: He is alert.    Wt Readings from Last 3 Encounters:  02/28/21 178 lb (80.7 kg)  02/19/21 180 lb (81.6 kg)  10/09/20 181 lb (82.1 kg)    BP 138/64    Pulse 61    Ht 6' 2"  (1.88 m)    Wt 178 lb (80.7 kg)    SpO2 98%    BMI 22.85 kg/m   Assessment and Plan: 1. Anemia, unspecified type Mildly symptomatic currently but not acutely compromised.  He continues on Eliquis for now but stopped Celebrex. Continue oral iron and will get repeat labs.  Rec GI consult ASAP - will need EGD.  - Iron, TIBC and Ferritin Panel - CBC with Differential/Platelet - Vitamin B12 - Ambulatory referral to  Gastroenterology   Partially dictated using Dragon software. Any errors are unintentional.  Halina Maidens, MD [facility]  02/28/2021

## 2021-03-01 ENCOUNTER — Telehealth: Payer: Self-pay | Admitting: Internal Medicine

## 2021-03-01 LAB — IRON,TIBC AND FERRITIN PANEL
Ferritin: 9 ng/mL — ABNORMAL LOW (ref 30–400)
Iron Saturation: 4 % — CL (ref 15–55)
Iron: 17 ug/dL — ABNORMAL LOW (ref 38–169)
Total Iron Binding Capacity: 387 ug/dL (ref 250–450)
UIBC: 370 ug/dL — ABNORMAL HIGH (ref 111–343)

## 2021-03-01 LAB — CBC WITH DIFFERENTIAL/PLATELET
Basophils Absolute: 0.1 10*3/uL (ref 0.0–0.2)
Basos: 1 %
EOS (ABSOLUTE): 0.4 10*3/uL (ref 0.0–0.4)
Eos: 5 %
Hematocrit: 26.4 % — ABNORMAL LOW (ref 37.5–51.0)
Hemoglobin: 7.6 g/dL — ABNORMAL LOW (ref 13.0–17.7)
Immature Grans (Abs): 0 10*3/uL (ref 0.0–0.1)
Immature Granulocytes: 0 %
Lymphocytes Absolute: 1.9 10*3/uL (ref 0.7–3.1)
Lymphs: 27 %
MCH: 20.1 pg — ABNORMAL LOW (ref 26.6–33.0)
MCHC: 28.8 g/dL — ABNORMAL LOW (ref 31.5–35.7)
MCV: 70 fL — ABNORMAL LOW (ref 79–97)
Monocytes Absolute: 0.6 10*3/uL (ref 0.1–0.9)
Monocytes: 9 %
Neutrophils Absolute: 3.9 10*3/uL (ref 1.4–7.0)
Neutrophils: 58 %
Platelets: 325 10*3/uL (ref 150–450)
RBC: 3.78 x10E6/uL — ABNORMAL LOW (ref 4.14–5.80)
RDW: 18.6 % — ABNORMAL HIGH (ref 11.6–15.4)
WBC: 6.8 10*3/uL (ref 3.4–10.8)

## 2021-03-01 LAB — VITAMIN B12: Vitamin B-12: 1594 pg/mL — ABNORMAL HIGH (ref 232–1245)

## 2021-03-01 NOTE — Telephone Encounter (Signed)
Called pt with results. Pt stated that he is unable to make the appt March 1st due to being out of town. He stated mid April was fine. Which location can see him mid April?   [facility]

## 2021-03-01 NOTE — Telephone Encounter (Signed)
Please call patient about these results per Dr [name].

## 2021-03-01 NOTE — Telephone Encounter (Signed)
Dr B said to all [facility] and see if they can see him stat in April

## 2021-03-01 NOTE — Telephone Encounter (Signed)
Let him know that his blood count is essentially the same and his iron levels are very low.   I asked GI about an urgent visit and they can work him in on Wednesday March 1.  Can he go then?  Otherwise the first appt is mid April. Please message Ginger at Dr. Dorothey Baseman office to see what can be worked out.  We may have to call [facility] to see if they can do better. He should continue the iron supplement and reach out to Cardiology to see if he can stop the Eliquis temporarily.

## 2021-03-11 DIAGNOSIS — M545 Low back pain, unspecified: Secondary | ICD-10-CM | POA: Diagnosis not present

## 2021-03-11 DIAGNOSIS — M25551 Pain in right hip: Secondary | ICD-10-CM | POA: Diagnosis not present

## 2021-03-11 DIAGNOSIS — M5416 Radiculopathy, lumbar region: Secondary | ICD-10-CM | POA: Diagnosis not present

## 2021-03-11 DIAGNOSIS — G8929 Other chronic pain: Secondary | ICD-10-CM | POA: Diagnosis not present

## 2021-03-11 DIAGNOSIS — M6281 Muscle weakness (generalized): Secondary | ICD-10-CM | POA: Diagnosis not present

## 2021-03-11 DIAGNOSIS — M5126 Other intervertebral disc displacement, lumbar region: Secondary | ICD-10-CM | POA: Diagnosis not present

## 2021-03-11 DIAGNOSIS — M48062 Spinal stenosis, lumbar region with neurogenic claudication: Secondary | ICD-10-CM | POA: Diagnosis not present

## 2021-03-12 DIAGNOSIS — Z48817 Encounter for surgical aftercare following surgery on the skin and subcutaneous tissue: Secondary | ICD-10-CM | POA: Diagnosis not present

## 2021-03-12 DIAGNOSIS — Z4802 Encounter for removal of sutures: Secondary | ICD-10-CM | POA: Diagnosis not present

## 2021-03-14 DIAGNOSIS — M25551 Pain in right hip: Secondary | ICD-10-CM | POA: Diagnosis not present

## 2021-03-14 DIAGNOSIS — M545 Low back pain, unspecified: Secondary | ICD-10-CM | POA: Diagnosis not present

## 2021-03-14 DIAGNOSIS — M48062 Spinal stenosis, lumbar region with neurogenic claudication: Secondary | ICD-10-CM | POA: Diagnosis not present

## 2021-03-14 DIAGNOSIS — G8929 Other chronic pain: Secondary | ICD-10-CM | POA: Diagnosis not present

## 2021-03-14 DIAGNOSIS — M5126 Other intervertebral disc displacement, lumbar region: Secondary | ICD-10-CM | POA: Diagnosis not present

## 2021-03-14 DIAGNOSIS — M5416 Radiculopathy, lumbar region: Secondary | ICD-10-CM | POA: Diagnosis not present

## 2021-03-14 DIAGNOSIS — M6281 Muscle weakness (generalized): Secondary | ICD-10-CM | POA: Diagnosis not present

## 2021-03-18 ENCOUNTER — Other Ambulatory Visit: Payer: Self-pay | Admitting: Internal Medicine

## 2021-03-18 DIAGNOSIS — G8929 Other chronic pain: Secondary | ICD-10-CM | POA: Diagnosis not present

## 2021-03-18 DIAGNOSIS — M5416 Radiculopathy, lumbar region: Secondary | ICD-10-CM | POA: Diagnosis not present

## 2021-03-18 DIAGNOSIS — M6281 Muscle weakness (generalized): Secondary | ICD-10-CM | POA: Diagnosis not present

## 2021-03-18 DIAGNOSIS — M48062 Spinal stenosis, lumbar region with neurogenic claudication: Secondary | ICD-10-CM | POA: Diagnosis not present

## 2021-03-18 DIAGNOSIS — M5126 Other intervertebral disc displacement, lumbar region: Secondary | ICD-10-CM | POA: Diagnosis not present

## 2021-03-18 DIAGNOSIS — M545 Low back pain, unspecified: Secondary | ICD-10-CM | POA: Diagnosis not present

## 2021-03-18 DIAGNOSIS — E118 Type 2 diabetes mellitus with unspecified complications: Secondary | ICD-10-CM

## 2021-03-18 NOTE — Telephone Encounter (Signed)
Requested medications are due for refill today.  yes ? ?Requested medications are on the active medications list.  yes ? ?Last refill. 01/02/2021 #135 1 refill ? ?Future visit scheduled.   yes ? ?Notes to clinic.  Failed protocol d/t abnormal labs. ? ? ? ?Requested Prescriptions  ?Pending Prescriptions Disp Refills  ? metFORMIN (GLUCOPHAGE-XR) 500 MG 24 hr tablet [Pharmacy Med Name: METFORMIN ER TAB 500MG GP] 135 tablet 1  ?  Sig: TAKE 2 TABLETS DAILY WITH  BREAKFAST AND 1 TABLET     EVERY EVENING  ?  ? Endocrinology:  Diabetes - Biguanides Failed - 03/18/2021  9:33 AM  ?  ?  Failed - Cr in normal range and within 360 days  ?  Creatinine, Ser  ?Date Value Ref Range Status  ?02/19/2021 1.56 (H) 0.76 - 1.27 mg/dL Final  ?  ?  ?  ?  Failed - eGFR in normal range and within 360 days  ?  GFR calc Af Amer  ?Date Value Ref Range Status  ?05/19/2019 55 (L) >59 mL/min/1.73 Final  ?  Comment:  ?  **[facility] currently reports eGFR in compliance with the current** ?  recommendations of the [employer]. [facility] will ?  update reporting as new guidelines are published from the NKF-ASN ?  Task force. ?  ? ?GFR, Estimated  ?Date Value Ref Range Status  ?04/04/2020 >60 >60 mL/min Final  ?  Comment:  ?  (NOTE) ?Calculated using the CKD-EPI Creatinine Equation (2021) ?  ? ?eGFR  ?Date Value Ref Range Status  ?02/19/2021 44 (L) >59 mL/min/1.73 Final  ?  ?  ?  ?  Failed - B12 Level in normal range and within 720 days  ?  Vitamin B-12  ?Date Value Ref Range Status  ?02/28/2021 1,594 (H) 232 - 1,245 pg/mL Final  ?  ?  ?  ?  Passed - HBA1C is between 0 and 7.9 and within 180 days  ?  Hgb A1c MFr Bld  ?Date Value Ref Range Status  ?02/19/2021 6.7 (H) 4.8 - 5.6 % Final  ?  Comment:  ?           Prediabetes: 5.7 - 6.4 ?         Diabetes: >6.4 ?         Glycemic control for adults with diabetes: <7.0 ?  ?  ?  ?  ?  Passed - Valid encounter within last 6 months  ?  Recent Outpatient Visits   ? ?      ? 2 weeks ago Anemia,  unspecified type  ? [facility] Glean Hess, MD  ? 3 weeks ago Annual physical exam  ? [facility] Glean Hess, MD  ? 5 months ago Essential (primary) hypertension  ? [facility] Glean Hess, MD  ? 9 months ago Type II diabetes mellitus with complication [facility])  ? [facility] Glean Hess, MD  ? 1 year ago Annual physical exam  ? [facility] Glean Hess, MD  ? ?  ?  ?Future Appointments   ? ?        ? In 3 months [name] Jesse Sans, MD [facility], [place]  ? In 11 months Glean Hess, MD [facility], [place]  ? ?  ? ?  ?  ?  Passed - CBC within normal limits and completed in the last 12 months  ?  WBC  ?Date  Value Ref Range Status  ?02/28/2021 6.8 3.4 - 10.8 x10E3/uL Final  ?04/04/2020 5.4 4.0 - 10.5 K/uL Final  ? ?RBC  ?Date Value Ref Range Status  ?02/28/2021 3.78 (L) 4.14 - 5.80 x10E6/uL Final  ?04/04/2020 3.65 (L) 4.22 - 5.81 MIL/uL Final  ? ?Hemoglobin  ?Date Value Ref Range Status  ?02/28/2021 7.6 (L) 13.0 - 17.7 g/dL Final  ? ?Hematocrit  ?Date Value Ref Range Status  ?02/28/2021 26.4 (L) 37.5 - 51.0 % Final  ? ?MCHC  ?Date Value Ref Range Status  ?02/28/2021 28.8 (L) 31.5 - 35.7 g/dL Final  ?04/04/2020 34.4 30.0 - 36.0 g/dL Final  ? ?MCH  ?Date Value Ref Range Status  ?02/28/2021 20.1 (L) 26.6 - 33.0 pg Final  ?04/04/2020 32.3 26.0 - 34.0 pg Final  ? ?MCV  ?Date Value Ref Range Status  ?02/28/2021 70 (L) 79 - 97 fL Final  ?01/28/2011 96 80 - 100 fL Final  ? ?No results found for: PLTCOUNTKUC, LABPLAT, [place] ?RDW  ?Date Value Ref Range Status  ?02/28/2021 18.6 (H) 11.6 - 15.4 % Final  ?01/28/2011 13.3 11.5 - 14.5 % Final  ? ?  ?  ?  ?  ?

## 2021-03-20 DIAGNOSIS — M48062 Spinal stenosis, lumbar region with neurogenic claudication: Secondary | ICD-10-CM | POA: Diagnosis not present

## 2021-03-20 DIAGNOSIS — M5416 Radiculopathy, lumbar region: Secondary | ICD-10-CM | POA: Diagnosis not present

## 2021-03-20 DIAGNOSIS — M545 Low back pain, unspecified: Secondary | ICD-10-CM | POA: Diagnosis not present

## 2021-03-20 DIAGNOSIS — M5126 Other intervertebral disc displacement, lumbar region: Secondary | ICD-10-CM | POA: Diagnosis not present

## 2021-03-20 DIAGNOSIS — M6281 Muscle weakness (generalized): Secondary | ICD-10-CM | POA: Diagnosis not present

## 2021-03-20 DIAGNOSIS — M25551 Pain in right hip: Secondary | ICD-10-CM | POA: Diagnosis not present

## 2021-03-20 DIAGNOSIS — G8929 Other chronic pain: Secondary | ICD-10-CM | POA: Diagnosis not present

## 2021-03-26 DIAGNOSIS — M48062 Spinal stenosis, lumbar region with neurogenic claudication: Secondary | ICD-10-CM | POA: Diagnosis not present

## 2021-03-26 DIAGNOSIS — M545 Low back pain, unspecified: Secondary | ICD-10-CM | POA: Diagnosis not present

## 2021-03-26 DIAGNOSIS — M6281 Muscle weakness (generalized): Secondary | ICD-10-CM | POA: Diagnosis not present

## 2021-03-26 DIAGNOSIS — M5416 Radiculopathy, lumbar region: Secondary | ICD-10-CM | POA: Diagnosis not present

## 2021-03-26 DIAGNOSIS — M25551 Pain in right hip: Secondary | ICD-10-CM | POA: Diagnosis not present

## 2021-03-26 DIAGNOSIS — M5126 Other intervertebral disc displacement, lumbar region: Secondary | ICD-10-CM | POA: Diagnosis not present

## 2021-03-26 DIAGNOSIS — G8929 Other chronic pain: Secondary | ICD-10-CM | POA: Diagnosis not present

## 2021-03-29 DIAGNOSIS — M5416 Radiculopathy, lumbar region: Secondary | ICD-10-CM | POA: Diagnosis not present

## 2021-03-29 DIAGNOSIS — M48062 Spinal stenosis, lumbar region with neurogenic claudication: Secondary | ICD-10-CM | POA: Diagnosis not present

## 2021-03-29 DIAGNOSIS — M545 Low back pain, unspecified: Secondary | ICD-10-CM | POA: Diagnosis not present

## 2021-03-29 DIAGNOSIS — M5126 Other intervertebral disc displacement, lumbar region: Secondary | ICD-10-CM | POA: Diagnosis not present

## 2021-03-29 DIAGNOSIS — M25551 Pain in right hip: Secondary | ICD-10-CM | POA: Diagnosis not present

## 2021-03-29 DIAGNOSIS — M6281 Muscle weakness (generalized): Secondary | ICD-10-CM | POA: Diagnosis not present

## 2021-03-29 DIAGNOSIS — G8929 Other chronic pain: Secondary | ICD-10-CM | POA: Diagnosis not present

## 2021-04-05 DIAGNOSIS — G8929 Other chronic pain: Secondary | ICD-10-CM | POA: Diagnosis not present

## 2021-04-05 DIAGNOSIS — M5416 Radiculopathy, lumbar region: Secondary | ICD-10-CM | POA: Diagnosis not present

## 2021-04-05 DIAGNOSIS — M6281 Muscle weakness (generalized): Secondary | ICD-10-CM | POA: Diagnosis not present

## 2021-04-05 DIAGNOSIS — M48062 Spinal stenosis, lumbar region with neurogenic claudication: Secondary | ICD-10-CM | POA: Diagnosis not present

## 2021-04-05 DIAGNOSIS — M25551 Pain in right hip: Secondary | ICD-10-CM | POA: Diagnosis not present

## 2021-04-05 DIAGNOSIS — M5126 Other intervertebral disc displacement, lumbar region: Secondary | ICD-10-CM | POA: Diagnosis not present

## 2021-04-05 DIAGNOSIS — M545 Low back pain, unspecified: Secondary | ICD-10-CM | POA: Diagnosis not present

## 2021-04-10 DIAGNOSIS — M5416 Radiculopathy, lumbar region: Secondary | ICD-10-CM | POA: Diagnosis not present

## 2021-04-10 DIAGNOSIS — M25551 Pain in right hip: Secondary | ICD-10-CM | POA: Diagnosis not present

## 2021-04-10 DIAGNOSIS — M545 Low back pain, unspecified: Secondary | ICD-10-CM | POA: Diagnosis not present

## 2021-04-10 DIAGNOSIS — M48062 Spinal stenosis, lumbar region with neurogenic claudication: Secondary | ICD-10-CM | POA: Diagnosis not present

## 2021-04-10 DIAGNOSIS — M5126 Other intervertebral disc displacement, lumbar region: Secondary | ICD-10-CM | POA: Diagnosis not present

## 2021-04-10 DIAGNOSIS — G8929 Other chronic pain: Secondary | ICD-10-CM | POA: Diagnosis not present

## 2021-04-10 DIAGNOSIS — M6281 Muscle weakness (generalized): Secondary | ICD-10-CM | POA: Diagnosis not present

## 2021-04-13 ENCOUNTER — Other Ambulatory Visit: Payer: Self-pay | Admitting: Internal Medicine

## 2021-04-13 DIAGNOSIS — I10 Essential (primary) hypertension: Secondary | ICD-10-CM

## 2021-04-15 DIAGNOSIS — Z08 Encounter for follow-up examination after completed treatment for malignant neoplasm: Secondary | ICD-10-CM | POA: Diagnosis not present

## 2021-04-15 DIAGNOSIS — Z4801 Encounter for change or removal of surgical wound dressing: Secondary | ICD-10-CM | POA: Diagnosis not present

## 2021-04-15 DIAGNOSIS — Z85828 Personal history of other malignant neoplasm of skin: Secondary | ICD-10-CM | POA: Diagnosis not present

## 2021-04-15 DIAGNOSIS — L928 Other granulomatous disorders of the skin and subcutaneous tissue: Secondary | ICD-10-CM | POA: Diagnosis not present

## 2021-04-15 NOTE — Telephone Encounter (Signed)
Requested Prescriptions  ?Pending Prescriptions Disp Refills  ?? atenolol (TENORMIN) 50 MG tablet [Pharmacy Med Name: ATENOLOL TAB '50MG'$ ] 90 tablet 0  ?  Sig: TAKE 1 TABLET AT BEDTIME  ?  ? Cardiovascular: Beta Blockers 2 Failed - 04/13/2021 10:07 AM  ?  ?  Failed - Cr in normal range and within 360 days  ?  Creatinine, Ser  ?Date Value Ref Range Status  ?02/19/2021 1.56 (H) 0.76 - 1.27 mg/dL Final  ?   ?  ?  Passed - Last BP in normal range  ?  BP Readings from Last 1 Encounters:  ?02/28/21 138/64  ?   ?  ?  Passed - Last Heart Rate in normal range  ?  Pulse Readings from Last 1 Encounters:  ?02/28/21 61  ?   ?  ?  Passed - Valid encounter within last 6 months  ?  Recent Outpatient Visits   ?      ? 1 month ago Anemia, unspecified type  ? [facility] Glean Hess, MD  ? 1 month ago Annual physical exam  ? [facility] Glean Hess, MD  ? 6 months ago Essential (primary) hypertension  ? [facility] Glean Hess, MD  ? 10 months ago Type II diabetes mellitus with complication [facility])  ? [facility] Glean Hess, MD  ? 1 year ago Annual physical exam  ? [facility] Glean Hess, MD  ?  ?  ?Future Appointments   ?        ? In 2 months [name] Jesse Sans, MD [facility], [place]  ? In 10 months Glean Hess, MD [facility], [place]  ?  ? ?  ?  ?  ? ?

## 2021-04-16 DIAGNOSIS — M48062 Spinal stenosis, lumbar region with neurogenic claudication: Secondary | ICD-10-CM | POA: Diagnosis not present

## 2021-04-16 DIAGNOSIS — M545 Low back pain, unspecified: Secondary | ICD-10-CM | POA: Diagnosis not present

## 2021-04-16 DIAGNOSIS — M25551 Pain in right hip: Secondary | ICD-10-CM | POA: Diagnosis not present

## 2021-04-16 DIAGNOSIS — M5126 Other intervertebral disc displacement, lumbar region: Secondary | ICD-10-CM | POA: Diagnosis not present

## 2021-04-16 DIAGNOSIS — M5416 Radiculopathy, lumbar region: Secondary | ICD-10-CM | POA: Diagnosis not present

## 2021-04-16 DIAGNOSIS — G8929 Other chronic pain: Secondary | ICD-10-CM | POA: Diagnosis not present

## 2021-04-16 DIAGNOSIS — M6281 Muscle weakness (generalized): Secondary | ICD-10-CM | POA: Diagnosis not present

## 2021-04-18 DIAGNOSIS — M6281 Muscle weakness (generalized): Secondary | ICD-10-CM | POA: Diagnosis not present

## 2021-04-18 DIAGNOSIS — M5126 Other intervertebral disc displacement, lumbar region: Secondary | ICD-10-CM | POA: Diagnosis not present

## 2021-04-18 DIAGNOSIS — M5416 Radiculopathy, lumbar region: Secondary | ICD-10-CM | POA: Diagnosis not present

## 2021-04-18 DIAGNOSIS — M25551 Pain in right hip: Secondary | ICD-10-CM | POA: Diagnosis not present

## 2021-04-18 DIAGNOSIS — G8929 Other chronic pain: Secondary | ICD-10-CM | POA: Diagnosis not present

## 2021-04-18 DIAGNOSIS — M545 Low back pain, unspecified: Secondary | ICD-10-CM | POA: Diagnosis not present

## 2021-04-18 DIAGNOSIS — M48062 Spinal stenosis, lumbar region with neurogenic claudication: Secondary | ICD-10-CM | POA: Diagnosis not present

## 2021-04-22 ENCOUNTER — Ambulatory Visit (INDEPENDENT_AMBULATORY_CARE_PROVIDER_SITE_OTHER): Payer: Medicare HMO | Admitting: Gastroenterology

## 2021-04-22 ENCOUNTER — Encounter: Payer: Self-pay | Admitting: Gastroenterology

## 2021-04-22 VITALS — BP 153/73 | HR 73 | Temp 97.9°F | Ht 74.0 in | Wt 179.0 lb

## 2021-04-22 DIAGNOSIS — D500 Iron deficiency anemia secondary to blood loss (chronic): Secondary | ICD-10-CM

## 2021-04-22 MED ORDER — PEG 3350-KCL-NABCB-NACL-NASULF 236 G PO SOLR: 4000.0000 mL | Freq: Once | ORAL | 0 refills | Status: AC

## 2021-04-22 NOTE — Patient Instructions (Signed)
Call Shantana Christon at [phone] today, if you call back tomorrow, the number is [phone] ?

## 2021-04-22 NOTE — Progress Notes (Signed)
? ? ?Gastroenterology Consultation ? ?Referring Provider:     Glean Hess, MD ?Primary Care Physician:  Glean Hess, MD ?Primary Gastroenterologist:  Dr. Allen Norris     ?Reason for Consultation:     Iron deficiency anemia ?      ? HPI:   ?Maxwell Stanley is a 87 y.o. y/o male referred for consultation & management of iron deficiency anemia by Dr. [name], Jesse Sans, MD. This patient comes in today with a history of a colonoscopy in 2011 by Dr. Gustavo Lah.  Has been on Eliquis and Celebrex and was found to have low iron with his iron studies showing: ? ?Component ?    Latest Ref Rng 02/28/2021  ?TIBC ?    250 - 450 ug/dL 387   ?UIBC ?    111 - 343 ug/dL 370 (H)   ?Iron ?    38 - 169 ug/dL 17 (L)   ?Iron Saturation ?    15 - 55 % 4 (LL)   ?Ferritin ?    30 - 400 ng/mL 9 (L)   ? ?The patient was noted to have heme-negative stools when checked by his primary care provider.  His CBC has shown: ? ?Component ?    Latest Ref Rng 02/02/2019 01/18/2020 02/15/2020 04/04/2020 02/19/2021  ?Hemoglobin ?    13.0 - 17.7 g/dL 15.4  11.9 (L)  12.6 (L)  11.8 (L)  7.5 (L)   ?HCT ?    37.5 - 51.0 % 44.5  34.3 (L)  36.7 (L)  34.3 (L)  25.9 (L)   ?MCV ?    79 - 97 fL 96  96.9  93  94.0  70 (L)   ? ?Component ?    Latest Ref Rng 02/28/2021  ?Hemoglobin ?    13.0 - 17.7 g/dL 7.6 (L)   ?HCT ?    37.5 - 51.0 % 26.4 (L)   ?MCV ?    79 - 97 fL 70 (L)   ?  ?The patient had been feeling weak and tired but denied any black stools bloody stools or any signs of bleeding. He has lost 30 lbs in the last year. The patient does report having black stools panel is since he started taking iron. ? ? ?Past Medical History:  ?Diagnosis Date  ? A-fib ([place])   ? Allergy unknown  ? Anemia 02/28/2021  ? Basal cell carcinoma 11/28/2019  ? R neck post auricular, EDC  NODULAR AND INFILTRATIVE PATTERNS  ? Basal cell carcinoma 12/21/2019  ? R lateral calf, treated wiht EDC 01/26/2020  ? BCC (basal cell carcinoma) 06/19/2020  ? left distal calf, EDC 08/09/2020  ? 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 Cancer [facility])   ? skin cancers  ? Carpal tunnel syndrome on left   ? Cataract 11/2018  ? CKD stage 3 secondary to diabetes [facility])   ? Complication of anesthesia   ? DDD (degenerative disc disease), lumbar   ? Degenerative arthritis of finger   ? Diabetes mellitus without complication ([place]) 4098  ? Foot pain, left 04/17/2014  ? GERD (gastroesophageal reflux disease)   ? History of basal cell carcinoma (BCC) 08/21/2020  ? right chest pectoral tx'd with Moh's 08/21/2020  ? History of squamous cell carcinoma of skin 2021  ? left ear/Moh's  ? Hyperlipidemia   ? Hypertension   ? Nocturia   ? Organic impotence   ? PONV (postoperative nausea and vomiting)   ? Primary  osteoarthritis of both knees   ? Primary osteoarthritis of right hip   ? 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 left proximal calf, EDC 08/09/2020  ? Squamous cell carcinoma of skin 11/28/2019  ? L ear sup helix  WELL DIFFERENTIATED  ? Type 2 DM with CKD stage 3 and hypertension ([place])   ? Urinary frequency   ? ? ?Past Surgical History:  ?Procedure Laterality Date  ? ANKLE ARTHROSCOPY WITH OPEN REDUCTION INTERNAL FIXATION (ORIF) Left 1998  ? COLONOSCOPY    ? ETHMOIDECTOMY Left 03/08/2020  ? Procedure: TOTAL ETHMOIDECTOMY;  Surgeon: Margaretha Sheffield, MD;  Location: [place];  Service: ENT;  Laterality: Left;  ? FRONTAL SINUS EXPLORATION Left 03/08/2020  ? Procedure: FRONTAL SINUS EXPLORATION;  Surgeon: Margaretha Sheffield, MD;  Location: [place];  Service: ENT;  Laterality: Left;  ? HEMORRHOID SURGERY    ? HERNIA REPAIR Left 2836  ? umbilical and left inguinal  ? IMAGE GUIDED SINUS SURGERY N/A 03/08/2020  ? Procedure: IMAGE GUIDED SINUS SURGERY;  Surgeon: Margaretha Sheffield, MD;  Location: [place];  Service: ENT;  Laterality: N/A;  placed disk on OR charge nurse desk 2-21 kp ?PLACED 2ND DISK ON OR CHARGE NURSE DESK 02-28-20 KP ?3RD DISK ON OR CHARGE NURSE DESK 2-25- KP  ? INGUINAL HERNIA  REPAIR Right 11/12/2018  ? Procedure: HERNIA REPAIR INGUINAL ADULT;  Surgeon: Fredirick Maudlin, MD;  Location: [facility];  Service: General;  Laterality: Right;  ? INGUINAL HERNIA REPAIR Left 2012  ? KNEE ARTHROPLASTY Right 04/16/2020  ? Procedure: COMPUTER ASSISTED TOTAL KNEE ARTHROPLASTY;  Surgeon: Dereck Leep, MD;  Location: [facility];  Service: Orthopedics;  Laterality: Right;  ? KNEE ARTHROSCOPY Right 2006  ? MAXILLARY ANTROSTOMY Left 03/08/2020  ? Procedure: MAXILLARY ANTROSTOMY WITH TISSUE REMOVAL;  Surgeon: Margaretha Sheffield, MD;  Location: [place];  Service: ENT;  Laterality: Left;  ? SEPTOPLASTY N/A 03/08/2020  ? Procedure: SEPTOPLASTY;  Surgeon: Margaretha Sheffield, MD;  Location: [place];  Service: ENT;  Laterality: N/A;  ? skin cancer resection Right 04/07/2018  ? ON hand.  ? THUMB ARTHROSCOPY Right 2013  ? tendon flap to joint  ? TONSILLECTOMY    ? UPPER GI ENDOSCOPY    ? ? ?Prior to Admission medications   ?Medication Sig Start Date End Date Taking? Authorizing Provider  ?acetaminophen (TYLENOL) 500 MG tablet Take 1,000 mg by mouth daily.    [provider]  ?amLODipine (NORVASC) 2.5 MG tablet TAKE 1 TABLET DAILY 10/06/20   Glean Hess, MD  ?atenolol (TENORMIN) 50 MG tablet TAKE 1 TABLET AT BEDTIME 04/15/21   Glean Hess, MD  ?celecoxib (CELEBREX) 200 MG capsule Take 200 mg by mouth 2 (two) times daily. ?Patient not taking: Reported on 02/28/2021    [provider]  ?cholecalciferol (VITAMIN D) 1000 UNITS tablet Take 1,000 Units by mouth daily.    [provider]  ?clotrimazole (LOTRIMIN) 1 % cream Apply 1 application topically 2 (two) times daily. ?Patient taking differently: Apply 1 application topically 2 (two) times daily as needed (fungus). 10/10/19   Ralene Bathe, MD  ?docusate sodium (COLACE) 100 MG capsule Take 200 mg by mouth daily.     [provider]  ?ELIQUIS 5 MG TABS tablet Take 1 tablet (5 mg total) by mouth 2 (two) times daily.  01/30/20   Glean Hess, MD  ?Ferrous Sulfate (IRON PO) Take by mouth daily.    [provider]  ?  finasteride (PROSCAR) 5 MG tablet TAKE 1 TABLET DAILY 12/06/20   Glean Hess, MD  ?fluorouracil (EFUDEX) 5 % cream Apply topically. 02/26/21   [provider]  ?fluticasone (FLONASE) 50 MCG/ACT nasal spray Place 2 sprays into both nostrils daily. 05/02/20   Glean Hess, MD  ?glucose blood [facility] ULTRA) test strip Use as instructed 02/19/21   Glean Hess, MD  ?losartan (COZAAR) 100 MG tablet TAKE 1/2 TABLET TWICE A DAY 09/15/20   Glean Hess, MD  ?Melatonin 10 MG CAPS Take 10 mg by mouth at bedtime.    [provider]  ?metFORMIN (GLUCOPHAGE-XR) 500 MG 24 hr tablet Take 1 tablet by mouth daily with breakfast AND 2 tablets every evening. 03/19/21   Glean Hess, MD  ?Multiple Vitamin (MULTIVITAMIN WITH MINERALS) TABS tablet Take 0.5 tablets by mouth in the morning and at bedtime.    [provider]  ?mupirocin ointment (BACTROBAN) 2 % APPLY TO AFFECTED AREA EVERY DAY 07/23/20   Ralene Bathe, MD  ?Jonetta Speak Lancets 17R MISC 1 each by Does not apply route daily. Use to test blood sugar once daily. 02/02/19   Glean Hess, MD  ?pantoprazole (PROTONIX) 40 MG tablet TAKE 1 TABLET TWICE A DAY 10/06/20   Glean Hess, MD  ?Probiotic Product (PROBIOTIC DAILY PO) Take 1 tablet by mouth daily.    [provider]  ?rosuvastatin (CRESTOR) 20 MG tablet Take 1 tablet (20 mg total) by mouth at bedtime. 02/19/21   Glean Hess, MD  ?sitaGLIPtin (JANUVIA) 100 MG tablet Take 1 tablet (100 mg total) by mouth daily. ?Patient taking differently: Take 50 mg by mouth daily. 02/19/21   Glean Hess, MD  ?tacrolimus (PROTOPIC) 0.1 % ointment Apply topically 2 (two) times daily. 08/09/20   Ralene Bathe, MD  ?tamsulosin (FLOMAX) 0.4 MG CAPS capsule Take 1 capsule (0.4 mg total) by mouth daily after supper. 02/19/21   Glean Hess, MD   ?triamcinolone cream (KENALOG) 0.1 % Apply 1 application topically daily as needed for irritation.    [provider]  ? ? ?Family History  ?Problem Relation Age of Onset  ? Diabetes Mother   ? Coronary arte

## 2021-04-24 DIAGNOSIS — M25551 Pain in right hip: Secondary | ICD-10-CM | POA: Diagnosis not present

## 2021-04-24 DIAGNOSIS — M5126 Other intervertebral disc displacement, lumbar region: Secondary | ICD-10-CM | POA: Diagnosis not present

## 2021-04-24 DIAGNOSIS — M545 Low back pain, unspecified: Secondary | ICD-10-CM | POA: Diagnosis not present

## 2021-04-24 DIAGNOSIS — M48062 Spinal stenosis, lumbar region with neurogenic claudication: Secondary | ICD-10-CM | POA: Diagnosis not present

## 2021-04-24 DIAGNOSIS — M6281 Muscle weakness (generalized): Secondary | ICD-10-CM | POA: Diagnosis not present

## 2021-04-24 DIAGNOSIS — G8929 Other chronic pain: Secondary | ICD-10-CM | POA: Diagnosis not present

## 2021-04-24 DIAGNOSIS — M5416 Radiculopathy, lumbar region: Secondary | ICD-10-CM | POA: Diagnosis not present

## 2021-04-29 ENCOUNTER — Other Ambulatory Visit: Payer: Self-pay | Admitting: Internal Medicine

## 2021-04-29 DIAGNOSIS — I10 Essential (primary) hypertension: Secondary | ICD-10-CM

## 2021-04-30 DIAGNOSIS — Z96651 Presence of right artificial knee joint: Secondary | ICD-10-CM | POA: Diagnosis not present

## 2021-04-30 DIAGNOSIS — M1712 Unilateral primary osteoarthritis, left knee: Secondary | ICD-10-CM | POA: Diagnosis not present

## 2021-04-30 DIAGNOSIS — M1611 Unilateral primary osteoarthritis, right hip: Secondary | ICD-10-CM | POA: Diagnosis not present

## 2021-04-30 NOTE — Telephone Encounter (Signed)
Requested Prescriptions  ?Pending Prescriptions Disp Refills  ?? amLODipine (NORVASC) 2.5 MG tablet [Pharmacy Med Name: AMLODIPINE TAB 2.'5MG'$ ] 90 tablet 1  ?  Sig: TAKE 1 TABLET DAILY  ?  ? Cardiovascular: Calcium Channel Blockers 2 Failed - 04/29/2021 12:08 PM  ?  ?  Failed - Last BP in normal range  ?  BP Readings from Last 1 Encounters:  ?04/22/21 (!) 153/73  ?   ?  ?  Passed - Last Heart Rate in normal range  ?  Pulse Readings from Last 1 Encounters:  ?04/22/21 73  ?   ?  ?  Passed - Valid encounter within last 6 months  ?  Recent Outpatient Visits   ?      ? 2 months ago Anemia, unspecified type  ? [facility] Glean Hess, MD  ? 2 months ago Annual physical exam  ? [facility] Glean Hess, MD  ? 6 months ago Essential (primary) hypertension  ? [facility] Glean Hess, MD  ? 10 months ago Type II diabetes mellitus with complication [facility])  ? [facility] Glean Hess, MD  ? 1 year ago Annual physical exam  ? [facility] Glean Hess, MD  ?  ?  ?Future Appointments   ?        ? In 1 month [name] Jesse Sans, MD [facility], [place]  ? In 9 months Glean Hess, MD [facility], [place]  ?  ? ?  ?  ?  ? ?

## 2021-05-13 DIAGNOSIS — I872 Venous insufficiency (chronic) (peripheral): Secondary | ICD-10-CM | POA: Diagnosis not present

## 2021-05-13 DIAGNOSIS — C44722 Squamous cell carcinoma of skin of right lower limb, including hip: Secondary | ICD-10-CM | POA: Diagnosis not present

## 2021-05-13 DIAGNOSIS — C44712 Basal cell carcinoma of skin of right lower limb, including hip: Secondary | ICD-10-CM | POA: Diagnosis not present

## 2021-05-13 DIAGNOSIS — D485 Neoplasm of uncertain behavior of skin: Secondary | ICD-10-CM | POA: Diagnosis not present

## 2021-05-15 DIAGNOSIS — D485 Neoplasm of uncertain behavior of skin: Secondary | ICD-10-CM | POA: Diagnosis not present

## 2021-05-22 DIAGNOSIS — E1169 Type 2 diabetes mellitus with other specified complication: Secondary | ICD-10-CM | POA: Diagnosis not present

## 2021-05-22 DIAGNOSIS — I6523 Occlusion and stenosis of bilateral carotid arteries: Secondary | ICD-10-CM | POA: Insufficient documentation

## 2021-05-22 DIAGNOSIS — N183 Chronic kidney disease, stage 3 unspecified: Secondary | ICD-10-CM | POA: Diagnosis not present

## 2021-05-22 DIAGNOSIS — E785 Hyperlipidemia, unspecified: Secondary | ICD-10-CM | POA: Diagnosis not present

## 2021-05-22 DIAGNOSIS — E1122 Type 2 diabetes mellitus with diabetic chronic kidney disease: Secondary | ICD-10-CM | POA: Diagnosis not present

## 2021-05-22 DIAGNOSIS — I10 Essential (primary) hypertension: Secondary | ICD-10-CM | POA: Diagnosis not present

## 2021-05-22 DIAGNOSIS — D509 Iron deficiency anemia, unspecified: Secondary | ICD-10-CM | POA: Diagnosis not present

## 2021-05-22 DIAGNOSIS — I480 Paroxysmal atrial fibrillation: Secondary | ICD-10-CM | POA: Diagnosis not present

## 2021-05-28 ENCOUNTER — Encounter
Admission: RE | Disposition: A | Discharge status: Home / Self Care | Payer: Self-pay | Source: Home / Self Care | Attending: Gastroenterology

## 2021-05-28 ENCOUNTER — Ambulatory Visit
Admission: RE | Admit: 2021-05-28 | Discharge: 2021-05-28 | Disposition: A | Discharge status: Home / Self Care | Payer: Medicare HMO | Attending: Gastroenterology | Admitting: Gastroenterology

## 2021-05-28 ENCOUNTER — Ambulatory Visit: Payer: Medicare HMO | Admitting: Anesthesiology

## 2021-05-28 ENCOUNTER — Encounter: Payer: Self-pay | Admitting: Gastroenterology

## 2021-05-28 DIAGNOSIS — E1122 Type 2 diabetes mellitus with diabetic chronic kidney disease: Secondary | ICD-10-CM | POA: Diagnosis not present

## 2021-05-28 DIAGNOSIS — I4891 Unspecified atrial fibrillation: Secondary | ICD-10-CM | POA: Diagnosis not present

## 2021-05-28 DIAGNOSIS — I129 Hypertensive chronic kidney disease with stage 1 through stage 4 chronic kidney disease, or unspecified chronic kidney disease: Secondary | ICD-10-CM | POA: Diagnosis not present

## 2021-05-28 DIAGNOSIS — D122 Benign neoplasm of ascending colon: Secondary | ICD-10-CM | POA: Insufficient documentation

## 2021-05-28 DIAGNOSIS — N183 Chronic kidney disease, stage 3 unspecified: Secondary | ICD-10-CM | POA: Diagnosis not present

## 2021-05-28 DIAGNOSIS — N400 Enlarged prostate without lower urinary tract symptoms: Secondary | ICD-10-CM | POA: Insufficient documentation

## 2021-05-28 DIAGNOSIS — K641 Second degree hemorrhoids: Secondary | ICD-10-CM | POA: Diagnosis not present

## 2021-05-28 DIAGNOSIS — D125 Benign neoplasm of sigmoid colon: Secondary | ICD-10-CM | POA: Insufficient documentation

## 2021-05-28 DIAGNOSIS — D500 Iron deficiency anemia secondary to blood loss (chronic): Secondary | ICD-10-CM | POA: Insufficient documentation

## 2021-05-28 DIAGNOSIS — M5136 Other intervertebral disc degeneration, lumbar region: Secondary | ICD-10-CM | POA: Insufficient documentation

## 2021-05-28 DIAGNOSIS — K219 Gastro-esophageal reflux disease without esophagitis: Secondary | ICD-10-CM | POA: Insufficient documentation

## 2021-05-28 DIAGNOSIS — K635 Polyp of colon: Secondary | ICD-10-CM | POA: Diagnosis not present

## 2021-05-28 DIAGNOSIS — D126 Benign neoplasm of colon, unspecified: Secondary | ICD-10-CM | POA: Diagnosis not present

## 2021-05-28 HISTORY — PX: COLONOSCOPY WITH PROPOFOL: SHX5780

## 2021-05-28 HISTORY — PX: ESOPHAGOGASTRODUODENOSCOPY (EGD) WITH PROPOFOL: SHX5813

## 2021-05-28 SURGERY — COLONOSCOPY WITH PROPOFOL
Anesthesia: General

## 2021-05-28 MED ORDER — SODIUM CHLORIDE 0.9 % IV SOLN: INTRAVENOUS | Status: DC

## 2021-05-28 MED ORDER — EPHEDRINE SULFATE (PRESSORS) 50 MG/ML IJ SOLN
INTRAMUSCULAR | Status: DC | PRN
  Administered 2021-05-28: 10 mg via INTRAVENOUS

## 2021-05-28 MED ORDER — PROPOFOL 10 MG/ML IV BOLUS
INTRAVENOUS | Status: AC
  Filled 2021-05-28: qty 20

## 2021-05-28 MED ORDER — LIDOCAINE HCL (CARDIAC) PF 100 MG/5ML IV SOSY
PREFILLED_SYRINGE | INTRAVENOUS | Status: DC | PRN
  Administered 2021-05-28: 100 mg via INTRAVENOUS

## 2021-05-28 MED ORDER — GLYCOPYRROLATE 0.2 MG/ML IJ SOLN
INTRAMUSCULAR | Status: DC | PRN
  Administered 2021-05-28: .2 mg via INTRAVENOUS

## 2021-05-28 MED ORDER — PHENYLEPHRINE 80 MCG/ML (10ML) SYRINGE FOR IV PUSH (FOR BLOOD PRESSURE SUPPORT)
PREFILLED_SYRINGE | INTRAVENOUS | Status: DC | PRN
  Administered 2021-05-28 (×2): 80 ug via INTRAVENOUS
  Administered 2021-05-28: 160 ug via INTRAVENOUS

## 2021-05-28 MED ORDER — PROPOFOL 500 MG/50ML IV EMUL
INTRAVENOUS | Status: DC | PRN
  Administered 2021-05-28: 150 ug/kg/min via INTRAVENOUS

## 2021-05-28 MED ORDER — PROPOFOL 10 MG/ML IV BOLUS
INTRAVENOUS | Status: DC | PRN
  Administered 2021-05-28: 70 mg via INTRAVENOUS

## 2021-05-28 NOTE — Transfer of Care (Signed)
Immediate Anesthesia Transfer of Care Note  Patient: Maxwell Stanley  Procedure(s) Performed: COLONOSCOPY WITH PROPOFOL ESOPHAGOGASTRODUODENOSCOPY (EGD) WITH PROPOFOL  Patient Location: Endoscopy Unit  Anesthesia Type:General  Level of Consciousness: drowsy  Airway & Oxygen Therapy: Patient Spontanous Breathing  Post-op Assessment: Report given to RN and Post -op Vital signs reviewed and stable  Post vital signs: Reviewed and stable  Last Vitals:  Vitals Value Taken Time  BP 97/52 05/28/21 1030  Temp    Pulse 60 05/28/21 1031  Resp 12 05/28/21 1031  SpO2 98 % 05/28/21 1031  Vitals shown include unvalidated device data.  Last Pain:  Vitals:   05/28/21 0932  TempSrc: Temporal  PainSc: 0-No pain         Complications: No notable events documented.

## 2021-05-28 NOTE — Anesthesia Procedure Notes (Signed)
Date/Time: 05/28/2021 10:06 AM Performed by: Lily Peer, Safire Gordin, CRNA Pre-anesthesia Checklist: Patient identified, Emergency Drugs available, Suction available, Patient being monitored and Timeout performed Patient Re-evaluated:Patient Re-evaluated prior to induction Oxygen Delivery Method: Simple face mask Induction Type: IV induction

## 2021-05-28 NOTE — Anesthesia Preprocedure Evaluation (Signed)
Anesthesia Evaluation  Patient identified by MRN, date of birth, ID band Patient awake    Reviewed: Allergy & Precautions, NPO status , Patient's Chart, lab work & pertinent test results  History of Anesthesia Complications (+) PONV and history of anesthetic complications  Airway Mallampati: III  TM Distance: >3 FB Neck ROM: full    Dental  (+) Chipped, Poor Dentition, Missing   Pulmonary neg pulmonary ROS, neg shortness of breath,    Pulmonary exam normal        Cardiovascular hypertension, (-) anginaNormal cardiovascular exam     Neuro/Psych  Neuromuscular disease negative psych ROS   GI/Hepatic Neg liver ROS, GERD  Controlled,  Endo/Other  diabetes, Type 2  Renal/GU CRFRenal disease  negative genitourinary   Musculoskeletal   Abdominal   Peds  Hematology negative hematology ROS (+)   Anesthesia Other Findings Past Medical History: No date: A-fib [facility]) unknown: Allergy 02/28/2021: Anemia 11/28/2019: Basal cell carcinoma     Comment:  R neck post auricular, EDC  NODULAR AND INFILTRATIVE               PATTERNS 12/21/2019: Basal cell carcinoma     Comment:  R lateral calf, treated wiht [facility] 01/26/2020 06/19/2020: BCC (basal cell carcinoma)     Comment:  left distal calf, EDC 08/09/2020 No date: BPH (benign prostatic hypertrophy)     Comment:  with BOO No date: Cancer [facility])     Comment:  skin cancers No date: Carpal tunnel syndrome on left 11/2018: Cataract No date: CKD stage 3 secondary to diabetes ([place]) No date: Complication of anesthesia No date: DDD (degenerative disc disease), lumbar No date: Degenerative arthritis of finger 2017: Diabetes mellitus without complication ([place]) 90/24/0973: Foot pain, left No date: GERD (gastroesophageal reflux disease) 08/21/2020: History of basal cell carcinoma (BCC)     Comment:  right chest pectoral tx'd with Moh's 08/21/2020 2021: History of squamous cell carcinoma of  skin     Comment:  left ear/Moh's No date: Hyperlipidemia No date: Hypertension No date: Nocturia 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 No date: PONV (postoperative nausea and vomiting) No date: Primary osteoarthritis of both knees No date: Primary osteoarthritis of right hip 06/19/2020: Squamous cell carcinoma in situ (SCCIS)     Comment:  left proximal calf, EDC 08/09/2020 11/28/2019: Squamous cell carcinoma of skin     Comment:  L ear sup helix  WELL DIFFERENTIATED No date: Type 2 DM with CKD stage 3 and hypertension ([place]) No date: Urinary frequency  Past Surgical History: 1998: ANKLE ARTHROSCOPY WITH OPEN REDUCTION INTERNAL FIXATION (ORIF);  Left No date: COLONOSCOPY 03/08/2020: ETHMOIDECTOMY; Left     Comment:  Procedure: TOTAL ETHMOIDECTOMY;  Surgeon: Margaretha Sheffield,              MD;  Location: [place];  Service: ENT;                Laterality: Left; 03/08/2020: FRONTAL SINUS EXPLORATION; Left     Comment:  Procedure: FRONTAL SINUS EXPLORATION;  Surgeon: Margaretha Sheffield, MD;  Location: [place];  Service: ENT;               Laterality: Left; No date: HEMORRHOID SURGERY 2012: HERNIA REPAIR; Left     Comment:  umbilical and left inguinal 03/08/2020: IMAGE GUIDED SINUS SURGERY; N/A     Comment:  Procedure: IMAGE GUIDED SINUS SURGERY;  Surgeon:  Margaretha Sheffield, MD;  Location: [place];                Service: ENT;  Laterality: N/A;  placed disk on OR charge              nurse desk 2-21 kp PLACED 2ND DISK ON OR CHARGE NURSE               DESK 02-28-20 KP 3RD DISK ON OR CHARGE NURSE DESK 2-25-               KP 11/12/2018: [place]; Right     Comment:  Procedure: HERNIA REPAIR INGUINAL ADULT;  Surgeon:               Fredirick Maudlin, MD;  Location: [facility];  Service:               General;  Laterality: Right; 2012: [place]; Left 04/16/2020: KNEE ARTHROPLASTY; Right     Comment:  Procedure: COMPUTER ASSISTED  TOTAL KNEE ARTHROPLASTY;                Surgeon: Dereck Leep, MD;  Location: [facility];                Service: Orthopedics;  Laterality: Right; 2006: KNEE ARTHROSCOPY; Right 03/08/2020: MAXILLARY ANTROSTOMY; Left     Comment:  Procedure: MAXILLARY ANTROSTOMY WITH TISSUE REMOVAL;                Surgeon: Margaretha Sheffield, MD;  Location: [place];  Service: ENT;  Laterality: Left; 03/08/2020: SEPTOPLASTY; N/A     Comment:  Procedure: SEPTOPLASTY;  Surgeon: Margaretha Sheffield, MD;                Location: [place];  Service: ENT;                Laterality: N/A; 04/07/2018: skin cancer resection; Right     Comment:  ON hand. 2013: THUMB ARTHROSCOPY; Right     Comment:  tendon flap to joint No date: TONSILLECTOMY No date: UPPER GI ENDOSCOPY  BMI    Body Mass Index: 21.83 kg/m      Reproductive/Obstetrics negative OB ROS                             Anesthesia Physical Anesthesia Plan  ASA: 3  Anesthesia Plan: General   Post-op Pain Management:    Induction: Intravenous  PONV Risk Score and Plan: Dexamethasone, Ondansetron, Midazolam and Treatment may vary due to age or medical condition  Airway Management Planned: Natural Airway and Nasal Cannula  Additional Equipment:   Intra-op Plan:   Post-operative Plan:   Informed Consent: I have reviewed the patients History and Physical, chart, labs and discussed the procedure including the risks, benefits and alternatives for the proposed anesthesia with the patient or authorized representative who has indicated his/her understanding and acceptance.     Dental Advisory Given  Plan Discussed with: Anesthesiologist, CRNA and Surgeon  Anesthesia Plan Comments: (Patient consented for risks of anesthesia including but not limited to:  - adverse reactions to medications - risk of airway placement if required - damage to eyes, teeth, lips or other oral mucosa - nerve damage due  to positioning  - sore throat or hoarseness - Damage to heart, brain, nerves, lungs, other parts  of body or loss of life  Patient voiced understanding.)        Anesthesia Quick Evaluation

## 2021-05-28 NOTE — Op Note (Signed)
[facility] Patient Name: Maxwell Stanley Procedure Date: 05/28/2021 9:53 AM MRN: [id number] Account #: [id number] Date of Birth: 11/16/1934 Admit Type: Outpatient Age: 87 Room: [facility] ENDO ROOM 4 Gender: Male Note Status: Finalized Instrument Name: Jasper Riling [id number] Procedure:             Colonoscopy Indications:           Iron deficiency anemia Providers:             Lucilla Lame MD, MD Referring MD:          Halina Maidens, MD (Referring MD) Medicines:             Propofol per Anesthesia Complications:         No immediate complications. Procedure:             Pre-Anesthesia Assessment:                        - Prior to the procedure, a History and Physical was                         performed, and patient medications and allergies were                         reviewed. The patient's tolerance of previous                         anesthesia was also reviewed. The risks and benefits                         of the procedure and the sedation options and risks                         were discussed with the patient. All questions were                         answered, and informed consent was obtained. Prior                         Anticoagulants: The patient has taken no previous                         anticoagulant or antiplatelet agents. ASA Grade                         Assessment: II - A patient with mild systemic disease.                         After reviewing the risks and benefits, the patient                         was deemed in satisfactory condition to undergo the                         procedure.                        After obtaining informed consent, the colonoscope was  passed under direct vision. Throughout the procedure,                         the patient's blood pressure, pulse, and oxygen                         saturations were monitored continuously. The                         Colonoscope was  introduced through the anus and                         advanced to the the cecum, identified by appendiceal                         orifice and ileocecal valve. The colonoscopy was                         performed without difficulty. The patient tolerated                         the procedure well. The quality of the bowel                         preparation was excellent. Findings:      The perianal and digital rectal examinations were normal.      A 7 mm polyp was found in the ascending colon. The polyp was sessile.       The polyp was removed with a cold snare. Resection and retrieval were       complete.      A 3 mm polyp was found in the sigmoid colon. The polyp was sessile. The       polyp was removed with a cold snare. Resection and retrieval were       complete.      Non-bleeding internal hemorrhoids were found during retroflexion. The       hemorrhoids were Grade II (internal hemorrhoids that prolapse but reduce       spontaneously). Impression:            - One 7 mm polyp in the ascending colon, removed with                         a cold snare. Resected and retrieved.                        - One 3 mm polyp in the sigmoid colon, removed with a                         cold snare. Resected and retrieved.                        - Non-bleeding internal hemorrhoids. Recommendation:        - Discharge patient to home.                        - Resume previous diet.                        - Continue present medications.                        -  Await pathology results.                        - Repeat colonoscopy is not recommended for                         surveillance.                        - To visualize the small bowel, perform video capsule                         endoscopy as previously scheduled. Procedure Code(s):     --- Professional ---                        [phone], Colonoscopy, flexible; with removal of                         tumor(s), polyp(s), or other lesion(s)  by snare                         technique Diagnosis Code(s):     --- Professional ---                        D50.9, Iron deficiency anemia, unspecified                        K63.5, Polyp of colon CPT copyright 2019 American Medical Association. All rights reserved. The codes documented in this report are preliminary and upon coder review may  be revised to meet current compliance requirements. Lucilla Lame MD, MD 05/28/2021 10:23:50 AM This report has been signed electronically. Number of Addenda: 0 Note Initiated On: 05/28/2021 9:53 AM Scope Withdrawal Time: 0 hours 10 minutes 51 seconds  Total Procedure Duration: 0 hours 16 minutes 28 seconds  Estimated Blood Loss:  Estimated blood loss: none.      [facility]

## 2021-05-28 NOTE — H&P (Signed)
Lucilla Lame, MD [facility] [address] [place], [place] [address] [phone] Fax : [phone]  Primary Care Physician:  Glean Hess, MD Primary Gastroenterologist:  Dr. Allen Norris  Pre-Procedure History & Physical: HPI:  Maxwell Stanley is a 87 y.o. male is here for an endoscopy and colonoscopy.   Past Medical History:  Diagnosis Date   A-fib [facility])    Allergy unknown   Anemia 02/28/2021   Basal cell carcinoma 11/28/2019   R neck post auricular, EDC  NODULAR AND INFILTRATIVE PATTERNS   Basal cell carcinoma 12/21/2019   R lateral calf, treated wiht EDC 01/26/2020   BCC (basal cell carcinoma) 06/19/2020   left distal calf, EDC 08/09/2020   BPH (benign prostatic hypertrophy)    with BOO   Cancer ([place])    skin cancers   Carpal tunnel syndrome on left    Cataract 11/2018   CKD stage 3 secondary to diabetes [facility])    Complication of anesthesia    DDD (degenerative disc disease), lumbar    Degenerative arthritis of finger    Diabetes mellitus without complication ([place]) 1540   Foot pain, left 04/17/2014   GERD (gastroesophageal reflux disease)    History of basal cell carcinoma (BCC) 08/21/2020   right chest pectoral tx'd with Moh's 08/21/2020   History of squamous cell carcinoma of skin 2021   left ear/Moh's   Hyperlipidemia    Hypertension    Nocturia    Organic impotence    PONV (postoperative nausea and vomiting)    Primary osteoarthritis of both knees    Primary osteoarthritis of right hip    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   left proximal calf, EDC 08/09/2020   Squamous cell carcinoma of skin 11/28/2019   L ear sup helix  WELL DIFFERENTIATED   Type 2 DM with CKD stage 3 and hypertension ([place])    Urinary frequency     Past Surgical History:  Procedure Laterality Date   ANKLE ARTHROSCOPY WITH OPEN REDUCTION INTERNAL FIXATION (ORIF) Left 1998   COLONOSCOPY     ETHMOIDECTOMY Left 03/08/2020   Procedure: TOTAL ETHMOIDECTOMY;  Surgeon:  Margaretha Sheffield, MD;  Location: [place];  Service: ENT;  Laterality: Left;   FRONTAL SINUS EXPLORATION Left 03/08/2020   Procedure: FRONTAL SINUS EXPLORATION;  Surgeon: Margaretha Sheffield, MD;  Location: [place];  Service: ENT;  Laterality: Left;   HEMORRHOID SURGERY     HERNIA REPAIR Left 0867   umbilical and left inguinal   IMAGE GUIDED SINUS SURGERY N/A 03/08/2020   Procedure: IMAGE GUIDED SINUS SURGERY;  Surgeon: Margaretha Sheffield, MD;  Location: [place];  Service: ENT;  Laterality: N/A;  placed disk on OR charge nurse desk 2-21 kp PLACED 2ND DISK ON OR CHARGE NURSE DESK 02-28-20 KP 3RD DISK ON OR CHARGE NURSE DESK 2-25- KP   INGUINAL HERNIA REPAIR Right 11/12/2018   Procedure: HERNIA REPAIR INGUINAL ADULT;  Surgeon: Fredirick Maudlin, MD;  Location: [facility];  Service: General;  Laterality: Right;   INGUINAL HERNIA REPAIR Left 2012   KNEE ARTHROPLASTY Right 04/16/2020   Procedure: COMPUTER ASSISTED TOTAL KNEE ARTHROPLASTY;  Surgeon: Dereck Leep, MD;  Location: [facility];  Service: Orthopedics;  Laterality: Right;   KNEE ARTHROSCOPY Right 2006   MAXILLARY ANTROSTOMY Left 03/08/2020   Procedure: MAXILLARY ANTROSTOMY WITH TISSUE REMOVAL;  Surgeon: Margaretha Sheffield, MD;  Location: [place];  Service: ENT;  Laterality: Left;   SEPTOPLASTY N/A 03/08/2020   Procedure:  SEPTOPLASTY;  Surgeon: Margaretha Sheffield, MD;  Location: [place];  Service: ENT;  Laterality: N/A;   skin cancer resection Right 04/07/2018   ON hand.   THUMB ARTHROSCOPY Right 2013   tendon flap to joint   TONSILLECTOMY     UPPER GI ENDOSCOPY      Prior to Admission medications   Medication Sig Start Date End Date Taking? Authorizing Provider  atenolol (TENORMIN) 50 MG tablet TAKE 1 TABLET AT BEDTIME 04/15/21  Yes Glean Hess, MD  acetaminophen (TYLENOL) 500 MG tablet Take 1,000 mg by mouth daily.    [provider]  amLODipine (NORVASC) 2.5 MG tablet TAKE 1 TABLET DAILY  04/30/21   Glean Hess, MD  celecoxib (CELEBREX) 200 MG capsule Take 200 mg by mouth 2 (two) times daily.    [provider]  cholecalciferol (VITAMIN D) 1000 UNITS tablet Take 1,000 Units by mouth daily.    [provider]  clotrimazole (LOTRIMIN) 1 % cream Apply 1 application topically 2 (two) times daily. Patient taking differently: Apply 1 application. topically 2 (two) times daily as needed (fungus). 10/10/19   Ralene Bathe, MD  docusate sodium (COLACE) 100 MG capsule Take 200 mg by mouth daily.     [provider]  ELIQUIS 5 MG TABS tablet Take 1 tablet (5 mg total) by mouth 2 (two) times daily. 01/30/20   Glean Hess, MD  Ferrous Sulfate (IRON PO) Take by mouth daily.    [provider]  finasteride (PROSCAR) 5 MG tablet TAKE 1 TABLET DAILY 12/06/20   Glean Hess, MD  fluorouracil (EFUDEX) 5 % cream Apply topically. 02/26/21   [provider]  fluticasone (FLONASE) 50 MCG/ACT nasal spray Place 2 sprays into both nostrils daily. 05/02/20   Glean Hess, MD  glucose blood [facility] ULTRA) test strip Use as instructed 02/19/21   Glean Hess, MD  losartan (COZAAR) 100 MG tablet TAKE 1/2 TABLET TWICE A DAY 09/15/20   Glean Hess, MD  Melatonin 10 MG CAPS Take 10 mg by mouth at bedtime.    [provider]  metFORMIN (GLUCOPHAGE-XR) 500 MG 24 hr tablet Take 1 tablet by mouth daily with breakfast AND 2 tablets every evening. 03/19/21   Glean Hess, MD  Multiple Vitamin (MULTIVITAMIN WITH MINERALS) TABS tablet Take 0.5 tablets by mouth in the morning and at bedtime.    [provider]  mupirocin ointment (BACTROBAN) 2 % APPLY TO AFFECTED AREA EVERY DAY 07/23/20   Ralene Bathe, MD  OneTouch Delica Lancets 24M MISC 1 each by Does not apply route daily. Use to test blood sugar once daily. 02/02/19   Glean Hess, MD  pantoprazole (PROTONIX) 40 MG tablet TAKE 1 TABLET TWICE A DAY 10/06/20    Glean Hess, MD  Probiotic Product (PROBIOTIC DAILY PO) Take 1 tablet by mouth daily.    [provider]  rosuvastatin (CRESTOR) 20 MG tablet Take 1 tablet (20 mg total) by mouth at bedtime. 02/19/21   Glean Hess, MD  sitaGLIPtin (JANUVIA) 100 MG tablet Take 1 tablet (100 mg total) by mouth daily. Patient taking differently: Take 50 mg by mouth daily. 02/19/21   Glean Hess, MD  tacrolimus (PROTOPIC) 0.1 % ointment Apply topically 2 (two) times daily. 08/09/20   Ralene Bathe, MD  tamsulosin (FLOMAX) 0.4 MG CAPS capsule Take 1 capsule (0.4 mg total) by mouth daily after supper. 02/19/21   Glean Hess,  MD  triamcinolone cream (KENALOG) 0.1 % Apply 1 application topically daily as needed for irritation.    [provider]    Allergies as of 04/23/2021 - Review Complete 04/22/2021  Allergen Reaction Noted   Codeine Other (See Comments) 08/24/2009   Ceftriaxone Rash 01/07/2008   Penicillin g Rash 11/07/2014    Family History  Problem Relation Age of Onset   Diabetes Mother    Coronary artery disease Father    Hypertension Father    Hyperlipidemia Father    Leukemia Father     Social History   Socioeconomic History   Marital status: Married    Spouse name: Vaughan Basta   Number of children: 2   Years of education: some college   Highest education level: 12th grade  Occupational History   Occupation: Retired  Tobacco Use   Smoking status: Never   Smokeless tobacco: Never  Scientific laboratory technician Use: Never used  Substance and Sexual Activity   Alcohol use: No    Alcohol/week: 0.0 standard drinks   Drug use: No   Sexual activity: Not Currently    Birth control/protection: None  Other Topics Concern   Not on file  Social History Narrative   Caffeine in moderation. 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   Social Determinants of Health   Financial Resource Strain: Low Risk    Difficulty of Paying Living Expenses:  Not hard at all  Food Insecurity: No Food Insecurity   Worried About Charity fundraiser in the Last Year: Never true   Arboriculturist in the Last Year: Never true  Transportation Needs: No Transportation Needs   Lack of Transportation (Medical): No   Lack of Transportation (Non-Medical): No  Physical Activity: Inactive   Days of Exercise per Week: 0 days   Minutes of Exercise per Session: 0 min  Stress: No Stress Concern Present   Feeling of Stress : Not at all  Social Connections: Socially Integrated   Frequency of Communication with Friends and Family: More than three times a week   Frequency of Social Gatherings with Friends and Family: More than three times a week   Attends Religious Services: More than 4 times per year   Active Member of [employer] or Organizations: Yes   Attends Music therapist: More than 4 times per year   Marital Status: Married  Human resources officer Violence: Not At Risk   Fear of Current or Ex-Partner: No   Emotionally Abused: No   Physically Abused: No   Sexually Abused: No    Review of Systems: See HPI, otherwise negative ROS  Physical Exam: BP (!) 166/79   Pulse 63   Temp (!) 97.3 F (36.3 C) (Temporal)   Resp 18   Ht '6\' 2"'$  (1.88 m)   Wt 77.1 kg   SpO2 98%   BMI 21.83 kg/m  General:   Alert,  pleasant and cooperative in NAD Head:  Normocephalic and atraumatic. Neck:  Supple; no masses or thyromegaly. Lungs:  Clear throughout to auscultation.    Heart:  Regular rate and rhythm. Abdomen:  Soft, nontender and nondistended. Normal bowel sounds, without guarding, and without rebound.   Neurologic:  Alert and  oriented x4;  grossly normal neurologically.  Impression/Plan: [name] is here for an endoscopy and colonoscopy to be performed for IDA  Risks, benefits, limitations, and alternatives regarding  endoscopy and colonoscopy have been reviewed with the patient.  Questions have been answered.  All parties  agreeable.   Lucilla Lame, MD  05/28/2021, 9:55 AM

## 2021-05-28 NOTE — Op Note (Signed)
[facility] Gastroenterology Patient Name: Maxwell Stanley Procedure Date: 05/28/2021 9:54 AM MRN: [id number] Account #: [id number] Date of Birth: Sep 06, 1934 Admit Type: Outpatient Age: 87 Room: [facility] ENDO ROOM 4 Gender: Male Note Status: Finalized Instrument Name: Michaelle Birks [id number] Procedure:             Upper GI endoscopy Indications:           Iron deficiency anemia Providers:             Lucilla Lame MD, MD Referring MD:          Halina Maidens, MD (Referring MD) Medicines:             Propofol per Anesthesia Complications:         No immediate complications. Procedure:             Pre-Anesthesia Assessment:                        - Prior to the procedure, a History and Physical was                         performed, and patient medications and allergies were                         reviewed. The patient's tolerance of previous                         anesthesia was also reviewed. The risks and benefits                         of the procedure and the sedation options and risks                         were discussed with the patient. All questions were                         answered, and informed consent was obtained. Prior                         Anticoagulants: The patient has taken no previous                         anticoagulant or antiplatelet agents. ASA Grade                         Assessment: II - A patient with mild systemic disease.                         After reviewing the risks and benefits, the patient                         was deemed in satisfactory condition to undergo the                         procedure.                        After obtaining informed consent, the endoscope was  passed under direct vision. Throughout the procedure,                         the patient's blood pressure, pulse, and oxygen                         saturations were monitored continuously. The Endoscope                          was introduced through the mouth, and advanced to the                         second part of duodenum. The upper GI endoscopy was                         accomplished without difficulty. The patient tolerated                         the procedure well. Findings:      The examined esophagus was normal.      The stomach was normal.      The examined duodenum was normal. Impression:            - Normal esophagus.                        - Normal stomach.                        - Normal examined duodenum.                        - No specimens collected. Recommendation:        - Discharge patient to home.                        - Resume previous diet.                        - Continue present medications.                        - Perform a colonoscopy today. Procedure Code(s):     --- Professional ---                        [phone], Esophagogastroduodenoscopy, flexible,                         transoral; diagnostic, including collection of                         specimen(s) by brushing or washing, when performed                         (separate procedure) Diagnosis Code(s):     --- Professional ---                        D50.9, Iron deficiency anemia, unspecified CPT copyright 2019 American Medical Association. All rights reserved. The codes documented in this report are preliminary and upon coder review may  be revised to meet current compliance requirements.  Lucilla Lame MD, MD 05/28/2021 10:04:24 AM This report has been signed electronically. Number of Addenda: 0 Note Initiated On: 05/28/2021 9:54 AM Estimated Blood Loss:  Estimated blood loss: none.      [facility]

## 2021-05-28 NOTE — Anesthesia Postprocedure Evaluation (Signed)
Anesthesia Post Note  Patient: Maxwell Stanley  Procedure(s) Performed: COLONOSCOPY WITH PROPOFOL ESOPHAGOGASTRODUODENOSCOPY (EGD) WITH PROPOFOL  Patient location during evaluation: Endoscopy Anesthesia Type: General Level of consciousness: awake and alert Pain management: pain level controlled Vital Signs Assessment: post-procedure vital signs reviewed and stable Respiratory status: spontaneous breathing, nonlabored ventilation, respiratory function stable and patient connected to nasal cannula oxygen Cardiovascular status: blood pressure returned to baseline and stable Postop Assessment: no apparent nausea or vomiting Anesthetic complications: no   No notable events documented.   Last Vitals:  Vitals:   05/28/21 1040 05/28/21 1050  BP: (!) 141/66 (!) 120/52  Pulse: 63 63  Resp: (!) 1 18  Temp:    SpO2: 99% 99%    Last Pain:  Vitals:   05/28/21 1020  TempSrc: Temporal  PainSc:                  Precious Haws Jivan Symanski

## 2021-05-29 ENCOUNTER — Encounter: Payer: Self-pay | Admitting: Gastroenterology

## 2021-05-29 LAB — SURGICAL PATHOLOGY

## 2021-05-30 DIAGNOSIS — M1611 Unilateral primary osteoarthritis, right hip: Secondary | ICD-10-CM | POA: Diagnosis not present

## 2021-06-01 ENCOUNTER — Other Ambulatory Visit: Payer: Self-pay | Admitting: Internal Medicine

## 2021-06-04 NOTE — Telephone Encounter (Signed)
Requested medication (s) are due for refill today: yes  Requested medication (s) are on the active medication list: yes  Last refill:  12/06/20 #90/0  Future visit scheduled: yes  Notes to clinic:  Unable to refill per protocol due to failed labs, no updated results.      Requested Prescriptions  Pending Prescriptions Disp Refills   finasteride (PROSCAR) 5 MG tablet [Pharmacy Med Name: FINASTERIDE  TAB '5MG'$ ] 90 tablet 0    Sig: TAKE 1 TABLET DAILY     Urology: 5-alpha Reductase Inhibitors Failed - 06/01/2021  9:58 AM      Failed - PSA in normal range and within 360 days    PSA  Date Value Ref Range Status  04/10/2017 1.83  Final   Prostate Specific Ag, Serum  Date Value Ref Range Status  02/15/2020 0.7 0.0 - 4.0 ng/mL Final    Comment:    Roche ECLIA methodology. According to the American Urological Association, Serum PSA should decrease and remain at undetectable levels after radical prostatectomy. The AUA defines biochemical recurrence as an initial PSA value 0.2 ng/mL or greater followed by a subsequent confirmatory PSA value 0.2 ng/mL or greater. Values obtained with different assay methods or kits cannot be used interchangeably. Results cannot be interpreted as absolute evidence of the presence or absence of malignant disease.          Passed - Valid encounter within last 12 months    Recent Outpatient Visits           3 months ago Anemia, unspecified type   [facility] Glean Hess, MD   3 months ago Annual physical exam   [facility] Glean Hess, MD   7 months ago Essential (primary) hypertension   [facility] Glean Hess, MD   12 months ago Type II diabetes mellitus with complication [facility])   [facility] Glean Hess, MD   1 year ago Annual physical exam   [facility] Glean Hess, MD       Future Appointments             In 2 weeks [name] Jesse Sans, MD [facility],  [place]   In 8 months [name] Jesse Sans, MD [facility], [facility]

## 2021-06-05 ENCOUNTER — Other Ambulatory Visit: Payer: Self-pay | Admitting: Gastroenterology

## 2021-06-05 DIAGNOSIS — D500 Iron deficiency anemia secondary to blood loss (chronic): Secondary | ICD-10-CM

## 2021-06-12 DIAGNOSIS — C44722 Squamous cell carcinoma of skin of right lower limb, including hip: Secondary | ICD-10-CM | POA: Diagnosis not present

## 2021-06-12 DIAGNOSIS — C44712 Basal cell carcinoma of skin of right lower limb, including hip: Secondary | ICD-10-CM | POA: Diagnosis not present

## 2021-06-12 DIAGNOSIS — C44719 Basal cell carcinoma of skin of left lower limb, including hip: Secondary | ICD-10-CM | POA: Diagnosis not present

## 2021-06-12 DIAGNOSIS — D485 Neoplasm of uncertain behavior of skin: Secondary | ICD-10-CM | POA: Diagnosis not present

## 2021-06-20 ENCOUNTER — Ambulatory Visit (INDEPENDENT_AMBULATORY_CARE_PROVIDER_SITE_OTHER): Payer: Medicare HMO | Admitting: Internal Medicine

## 2021-06-20 ENCOUNTER — Encounter: Payer: Self-pay | Admitting: Internal Medicine

## 2021-06-20 VITALS — BP 135/78 | HR 89 | Ht 74.0 in | Wt 170.0 lb

## 2021-06-20 DIAGNOSIS — I10 Essential (primary) hypertension: Secondary | ICD-10-CM

## 2021-06-20 DIAGNOSIS — D509 Iron deficiency anemia, unspecified: Secondary | ICD-10-CM

## 2021-06-20 DIAGNOSIS — E1169 Type 2 diabetes mellitus with other specified complication: Secondary | ICD-10-CM | POA: Diagnosis not present

## 2021-06-20 DIAGNOSIS — E785 Hyperlipidemia, unspecified: Secondary | ICD-10-CM | POA: Diagnosis not present

## 2021-06-20 DIAGNOSIS — I4819 Other persistent atrial fibrillation: Secondary | ICD-10-CM | POA: Diagnosis not present

## 2021-06-20 DIAGNOSIS — E118 Type 2 diabetes mellitus with unspecified complications: Secondary | ICD-10-CM | POA: Diagnosis not present

## 2021-06-20 NOTE — Progress Notes (Signed)
Date:  06/20/2021   Name:  Maxwell Stanley   DOB:  1934-09-24   MRN:  [id number]   Chief Complaint: Diabetes and Hypertension  Diabetes He presents for his follow-up diabetic visit. He has type 2 diabetes mellitus. His disease course has been stable. Pertinent negatives for hypoglycemia include no dizziness, headaches, nervousness/anxiousness or tremors. Pertinent negatives for diabetes include no chest pain, no fatigue, no polydipsia and no polyuria. Symptoms are stable. Diabetic complications include heart disease. Pertinent negatives for diabetic complications include no CVA. Current diabetic treatment includes oral agent (dual therapy) (metformin and Januvia). His weight is decreasing steadily. He is following a generally healthy diet. His breakfast blood glucose is taken between 6-7 am. His breakfast blood glucose range is generally 130-140 mg/dl. An ACE inhibitor/angiotensin II receptor blocker is being taken.  Hypertension This is a chronic problem. The problem is controlled. Pertinent negatives include no chest pain, headaches, palpitations or shortness of breath. Past treatments include angiotensin blockers and beta blockers. Hypertensive end-organ damage includes CAD/MI. There is no history of CVA.  Anemia Presents for follow-up (colonoscopy unrevealing; capsule endoscopy planned) visit. There has been no abdominal pain or palpitations. There are no compliance problems.   On Iron daily.  Off of Eliquis.  Capsule endoscopy planned for next month.   Lab Results  Component Value Date   NA 139 02/19/2021   K 4.7 02/19/2021   CO2 22 02/19/2021   GLUCOSE 153 (H) 02/19/2021   BUN 24 02/19/2021   CREATININE 1.56 (H) 02/19/2021   CALCIUM 9.8 02/19/2021   EGFR 44 (L) 02/19/2021   GFRNONAA >60 04/04/2020   Lab Results  Component Value Date   CHOL 123 02/19/2021   HDL 39 (L) 02/19/2021   LDLCALC 61 02/19/2021   TRIG 127 02/19/2021   CHOLHDL 3.2 02/19/2021   Lab Results   Component Value Date   TSH 2.500 02/19/2021   Lab Results  Component Value Date   HGBA1C 6.7 (H) 02/19/2021   Lab Results  Component Value Date   WBC 6.8 02/28/2021   HGB 7.6 (L) 02/28/2021   HCT 26.4 (L) 02/28/2021   MCV 70 (L) 02/28/2021   PLT 325 02/28/2021   Lab Results  Component Value Date   ALT 16 02/19/2021   AST 19 02/19/2021   ALKPHOS 65 02/19/2021   BILITOT 0.4 02/19/2021   No results found for: "25OHVITD2", "25OHVITD3", "VD25OH"   Review of Systems  Constitutional:  Positive for appetite change and unexpected weight change. Negative for fatigue.  Eyes:  Negative for visual disturbance.  Respiratory:  Negative for cough, shortness of breath and wheezing.   Cardiovascular:  Negative for chest pain, palpitations and leg swelling.  Gastrointestinal:  Negative for abdominal pain and blood in stool.  Endocrine: Negative for polydipsia and polyuria.  Genitourinary:  Negative for dysuria and hematuria.  Skin:  Positive for wound (multiple skin cancers legs). Negative for color change and rash.  Neurological:  Negative for dizziness, tremors, numbness and headaches.  Psychiatric/Behavioral:  Negative for dysphoric mood and sleep disturbance. The patient is not nervous/anxious.     Patient Active Problem List   Diagnosis Date Noted   Anemia 02/28/2021   Total knee replacement status 04/16/2020   Acquired thrombophilia ([place]) 02/15/2020   Primary osteoarthritis of both knees 05/19/2019   Gastroesophageal reflux disease with esophagitis 02/02/2019   Personal history of other malignant neoplasm of skin 12/17/2018   Persistent atrial fibrillation ([place]) 10/29/2018   Reducible right inguinal  hernia 09/16/2018   DDD (degenerative disc disease), lumbar 12/23/2017   Primary osteoarthritis of right hip 12/23/2017   Onychomycosis of toenail 08/19/2017   Allergic rhinitis 08/08/2016   Carpal tunnel syndrome on left 12/12/2015   Chronic maxillary sinusitis 04/09/2015   Type  II diabetes mellitus with complication ([place]) 76/81/1572   Chronic low back pain 09/21/2014   Abnormal prostate specific antigen 04/17/2014   Enlarged prostate with lower urinary tract symptoms (LUTS) 04/17/2014   CKD stage 3 secondary to diabetes ([place]) 04/17/2014   Hyperlipidemia associated with type 2 diabetes mellitus ([place]) 04/17/2014   ED (erectile dysfunction) of organic origin 04/17/2014   Essential (primary) hypertension 04/17/2014   Back muscle spasm 04/17/2014   Degenerative arthritis of finger 04/17/2014   Breast development in males 04/17/2014   Leg varices 04/17/2014   EDEMA 08/24/2009    Allergies  Allergen Reactions   Codeine Other (See Comments)    Made his "Wild"   Ceftriaxone Rash   Penicillin G Rash    Did it involve swelling of the face/tongue/throat, SOB, or low BP? No Did it involve sudden or severe rash/hives, skin peeling, or any reaction on the inside of your mouth or nose? No Did you need to seek medical attention at a hospital or doctor's office? No When did it last happen?       If all above answers are "NO", may proceed with cephalosporin use.  IgE = 35 (WNL) on 01/18/2020     Past Surgical History:  Procedure Laterality Date   ANKLE ARTHROSCOPY WITH OPEN REDUCTION INTERNAL FIXATION (ORIF) Left 1998   COLONOSCOPY     COLONOSCOPY WITH PROPOFOL N/A 05/28/2021   Procedure: COLONOSCOPY WITH PROPOFOL;  Surgeon: Lucilla Lame, MD;  Location: [facility] ENDOSCOPY;  Service: Endoscopy;  Laterality: N/A;   ESOPHAGOGASTRODUODENOSCOPY (EGD) WITH PROPOFOL N/A 05/28/2021   Procedure: ESOPHAGOGASTRODUODENOSCOPY (EGD) WITH PROPOFOL;  Surgeon: Lucilla Lame, MD;  Location: [facility] ENDOSCOPY;  Service: Endoscopy;  Laterality: N/A;   ETHMOIDECTOMY Left 03/08/2020   Procedure: TOTAL ETHMOIDECTOMY;  Surgeon: Margaretha Sheffield, MD;  Location: [place];  Service: ENT;  Laterality: Left;   FRONTAL SINUS EXPLORATION Left 03/08/2020   Procedure: FRONTAL SINUS EXPLORATION;  Surgeon:  Margaretha Sheffield, MD;  Location: [place];  Service: ENT;  Laterality: Left;   HEMORRHOID SURGERY     HERNIA REPAIR Left 6203   umbilical and left inguinal   IMAGE GUIDED SINUS SURGERY N/A 03/08/2020   Procedure: IMAGE GUIDED SINUS SURGERY;  Surgeon: Margaretha Sheffield, MD;  Location: [place];  Service: ENT;  Laterality: N/A;  placed disk on OR charge nurse desk 2-21 kp PLACED 2ND DISK ON OR CHARGE NURSE DESK 02-28-20 KP 3RD DISK ON OR CHARGE NURSE DESK 2-25- [place] Right 11/12/2018   Procedure: HERNIA REPAIR INGUINAL ADULT;  Surgeon: Fredirick Maudlin, MD;  Location: [facility] ORS;  Service: General;  Laterality: Right;   INGUINAL HERNIA REPAIR Left 2012   KNEE ARTHROPLASTY Right 04/16/2020   Procedure: COMPUTER ASSISTED TOTAL KNEE ARTHROPLASTY;  Surgeon: Dereck Leep, MD;  Location: [facility] ORS;  Service: Orthopedics;  Laterality: Right;   KNEE ARTHROSCOPY Right 2006   MAXILLARY ANTROSTOMY Left 03/08/2020   Procedure: MAXILLARY ANTROSTOMY WITH TISSUE REMOVAL;  Surgeon: Margaretha Sheffield, MD;  Location: [place];  Service: ENT;  Laterality: Left;   SEPTOPLASTY N/A 03/08/2020   Procedure: SEPTOPLASTY;  Surgeon: Margaretha Sheffield, MD;  Location: [place];  Service: ENT;  Laterality: N/A;  skin cancer resection Right 04/07/2018   ON hand.   THUMB ARTHROSCOPY Right 2013   tendon flap to joint   TONSILLECTOMY     UPPER GI ENDOSCOPY      Social History   Tobacco Use   Smoking status: Never   Smokeless tobacco: Never  Vaping Use   Vaping Use: Never used  Substance Use Topics   Alcohol use: No    Alcohol/week: 0.0 standard drinks of alcohol   Drug use: No     Medication list has been reviewed and updated.  Current Meds  Medication Sig   acetaminophen (TYLENOL) 500 MG tablet Take 1,000 mg by mouth daily.   amLODipine (NORVASC) 2.5 MG tablet TAKE 1 TABLET DAILY   atenolol (TENORMIN) 50 MG tablet TAKE 1 TABLET AT BEDTIME   celecoxib (CELEBREX)  100 MG capsule Take 100 mg by mouth 2 (two) times daily.   cholecalciferol (VITAMIN D) 1000 UNITS tablet Take 1,000 Units by mouth daily.   clotrimazole (LOTRIMIN) 1 % cream Apply 1 application topically 2 (two) times daily. (Patient taking differently: Apply 1 application  topically 2 (two) times daily as needed (fungus).)   docusate sodium (COLACE) 100 MG capsule Take 200 mg by mouth daily.    Ferrous Sulfate (IRON PO) Take by mouth daily.   ferrous sulfate 325 (65 FE) MG EC tablet Take 1 tablet by mouth daily.   finasteride (PROSCAR) 5 MG tablet TAKE 1 TABLET DAILY   fluorouracil (EFUDEX) 5 % cream Apply topically.   fluticasone (FLONASE) 50 MCG/ACT nasal spray Place 2 sprays into both nostrils daily.   glucose blood (ONETOUCH ULTRA) test strip Use as instructed   losartan (COZAAR) 100 MG tablet TAKE 1/2 TABLET TWICE A DAY   Melatonin 10 MG CAPS Take 10 mg by mouth at bedtime.   metFORMIN (GLUCOPHAGE-XR) 500 MG 24 hr tablet Take 1 tablet by mouth daily with breakfast AND 2 tablets every evening.   Multiple Vitamin (MULTIVITAMIN WITH MINERALS) TABS tablet Take 0.5 tablets by mouth in the morning and at bedtime.   mupirocin ointment (BACTROBAN) 2 % APPLY TO AFFECTED AREA EVERY DAY   OneTouch Delica Lancets 37D MISC 1 each by Does not apply route daily. Use to test blood sugar once daily.   pantoprazole (PROTONIX) 40 MG tablet TAKE 1 TABLET TWICE A DAY   Probiotic Product (PROBIOTIC DAILY PO) Take 1 tablet by mouth daily.   rosuvastatin (CRESTOR) 20 MG tablet Take 1 tablet (20 mg total) by mouth at bedtime.   sitaGLIPtin (JANUVIA) 100 MG tablet Take 1 tablet (100 mg total) by mouth daily. (Patient taking differently: Take 50 mg by mouth daily.)   tacrolimus (PROTOPIC) 0.1 % ointment Apply topically 2 (two) times daily.   tamsulosin (FLOMAX) 0.4 MG CAPS capsule Take 1 capsule (0.4 mg total) by mouth daily after supper.   triamcinolone cream (KENALOG) 0.1 % Apply 1 application topically daily as  needed for irritation.       06/20/2021    8:02 AM 02/28/2021   10:43 AM 02/19/2021    9:03 AM 10/09/2020    8:26 AM  GAD 7 : Generalized Anxiety Score  Nervous, Anxious, on Edge 0 0 0 0  Control/stop worrying 0 0 0 0  Worry too much - different things 0 0 0 0  Trouble relaxing 0 0 0 0  Restless 0 0 0 0  Easily annoyed or irritable 0 0 0 0  Afraid - awful might happen 0 0 0 0  Total GAD 7 Score 0 0 0 0  Anxiety Difficulty Not difficult at all          06/20/2021    8:02 AM  Depression screen PHQ 2/9  Decreased Interest 0  Down, Depressed, Hopeless 0  PHQ - 2 Score 0  Altered sleeping 0  Tired, decreased energy 0  Change in appetite 2  Feeling bad or failure about yourself  0  Trouble concentrating 0  Moving slowly or fidgety/restless 0  Suicidal thoughts 0  PHQ-9 Score 2  Difficult doing work/chores Not difficult at all    BP Readings from Last 3 Encounters:  06/20/21 135/78  05/28/21 (!) 120/52  04/22/21 (!) 153/73    Physical Exam Vitals and nursing note reviewed.  Constitutional:      General: He is not in acute distress.    Appearance: Normal appearance. He is well-developed.  HENT:     Head: Normocephalic and atraumatic.  Cardiovascular:     Rate and Rhythm: Normal rate and regular rhythm.     Heart sounds: No murmur heard. Pulmonary:     Effort: Pulmonary effort is normal. No respiratory distress.     Breath sounds: No wheezing or rhonchi.  Musculoskeletal:     Cervical back: Normal range of motion.     Right lower leg: No edema.     Left lower leg: No edema.  Lymphadenopathy:     Cervical: No cervical adenopathy.  Skin:    General: Skin is warm and dry.     Capillary Refill: Capillary refill takes less than 2 seconds.     Findings: No rash.  Neurological:     General: No focal deficit present.     Mental Status: He is alert and oriented to person, place, and time.  Psychiatric:        Mood and Affect: Mood normal.        Behavior: Behavior  normal.     Wt Readings from Last 3 Encounters:  06/20/21 170 lb (77.1 kg)  05/28/21 170 lb (77.1 kg)  04/22/21 179 lb (81.2 kg)    BP 135/78 (BP Location: Right Arm, Cuff Size: Normal)   Pulse 89   Ht $R'6\' 2"'cq$  (1.88 m)   Wt 170 lb (77.1 kg)   SpO2 98%   BMI 21.83 kg/m   Assessment and Plan: 1. Essential (primary) hypertension Clinically stable exam with well controlled BP. Tolerating medications without side effects at this time. Pt to continue current regimen and low sodium diet; benefits of regular exercise as able discussed.  2. Type II diabetes mellitus with complication ([place]) Clinically stable by exam and report without s/s of hypoglycemia. DM complicated by hypertension and dyslipidemia. Tolerating medications well without side effects or other concerns. - Hemoglobin R9X - Basic metabolic panel  3. Hyperlipidemia associated with type 2 diabetes mellitus ([place]) Tolerating statin medication without side effects at this time LDL is at goal of < 70 on current dose Continue same therapy without change at this time.  4. Iron deficiency anemia, unspecified iron deficiency anemia type Colonoscopy negative.  Endoscopy planned. Continues on iron, off of Eliquis. He is back on Celebrex however for arthritis. Repeat labs to check for improvement. - CBC with Differential/Platelet - Iron, TIBC and Ferritin Panel  5. Persistent atrial fibrillation (HCC) Rate controlled - appears to be SR today Considering the Watchman device to occlude the left atrial appendage.   Partially dictated using Editor, commissioning. Any errors are unintentional.  Halina Maidens, MD  [facility]  06/20/2021

## 2021-06-21 LAB — CBC WITH DIFFERENTIAL/PLATELET
Basophils Absolute: 0 10*3/uL (ref 0.0–0.2)
Basos: 1 %
EOS (ABSOLUTE): 0.3 10*3/uL (ref 0.0–0.4)
Eos: 6 %
Hematocrit: 34.5 % — ABNORMAL LOW (ref 37.5–51.0)
Hemoglobin: 10.9 g/dL — ABNORMAL LOW (ref 13.0–17.7)
Immature Grans (Abs): 0 10*3/uL (ref 0.0–0.1)
Immature Granulocytes: 0 %
Lymphocytes Absolute: 1.4 10*3/uL (ref 0.7–3.1)
Lymphs: 26 %
MCH: 25.9 pg — ABNORMAL LOW (ref 26.6–33.0)
MCHC: 31.6 g/dL (ref 31.5–35.7)
MCV: 82 fL (ref 79–97)
Monocytes Absolute: 0.5 10*3/uL (ref 0.1–0.9)
Monocytes: 10 %
Neutrophils Absolute: 3 10*3/uL (ref 1.4–7.0)
Neutrophils: 57 %
Platelets: 238 10*3/uL (ref 150–450)
RBC: 4.21 x10E6/uL (ref 4.14–5.80)
RDW: 18.5 % — ABNORMAL HIGH (ref 11.6–15.4)
WBC: 5.2 10*3/uL (ref 3.4–10.8)

## 2021-06-21 LAB — BASIC METABOLIC PANEL
BUN/Creatinine Ratio: 12 (ref 10–24)
BUN: 16 mg/dL (ref 8–27)
CO2: 21 mmol/L (ref 20–29)
Calcium: 10 mg/dL (ref 8.6–10.2)
Chloride: 105 mmol/L (ref 96–106)
Creatinine, Ser: 1.39 mg/dL — ABNORMAL HIGH (ref 0.76–1.27)
Glucose: 159 mg/dL — ABNORMAL HIGH (ref 70–99)
Potassium: 4.7 mmol/L (ref 3.5–5.2)
Sodium: 141 mmol/L (ref 134–144)
eGFR: 50 mL/min/{1.73_m2} — ABNORMAL LOW (ref >59)

## 2021-06-21 LAB — IRON,TIBC AND FERRITIN PANEL
Ferritin: 20 ng/mL — ABNORMAL LOW (ref 30–400)
Iron Saturation: 11 % — ABNORMAL LOW (ref 15–55)
Iron: 37 ug/dL — ABNORMAL LOW (ref 38–169)
Total Iron Binding Capacity: 324 ug/dL (ref 250–450)
UIBC: 287 ug/dL (ref 111–343)

## 2021-06-21 LAB — HEMOGLOBIN A1C
Est. average glucose Bld gHb Est-mCnc: 148 mg/dL
Hgb A1c MFr Bld: 6.8 % — ABNORMAL HIGH (ref 4.8–5.6)

## 2021-06-23 ENCOUNTER — Other Ambulatory Visit: Payer: Self-pay | Admitting: Internal Medicine

## 2021-06-23 DIAGNOSIS — E118 Type 2 diabetes mellitus with unspecified complications: Secondary | ICD-10-CM

## 2021-06-24 NOTE — Telephone Encounter (Signed)
Requested Prescriptions  Pending Prescriptions Disp Refills  . metFORMIN (GLUCOPHAGE-XR) 500 MG 24 hr tablet [Pharmacy Med Name: METFORMIN ER TAB 500MG GP] 135 tablet 1    Sig: TAKE 1 TABLET DAILY WITH   BREAKFAST AND 2 TABLETS    EVERY EVENING.     Endocrinology:  Diabetes - Biguanides Failed - 06/23/2021  9:39 PM      Failed - Cr in normal range and within 360 days    Creatinine, Ser  Date Value Ref Range Status  06/20/2021 1.39 (H) 0.76 - 1.27 mg/dL Final         Failed - eGFR in normal range and within 360 days    GFR calc Af Amer  Date Value Ref Range Status  05/19/2019 55 (L) >59 mL/min/1.73 Final    Comment:    **[facility] currently reports eGFR in compliance with the current**   recommendations of the [employer]. [facility] will   update reporting as new guidelines are published from the NKF-ASN   Task force.    GFR, Estimated  Date Value Ref Range Status  04/04/2020 >60 >60 mL/min Final    Comment:    (NOTE) Calculated using the CKD-EPI Creatinine Equation (2021)    eGFR  Date Value Ref Range Status  06/20/2021 50 (L) >59 mL/min/1.73 Final         Failed - B12 Level in normal range and within 720 days    Vitamin B-12  Date Value Ref Range Status  02/28/2021 1,594 (H) 232 - 1,245 pg/mL Final         Failed - CBC within normal limits and completed in the last 12 months    WBC  Date Value Ref Range Status  06/20/2021 5.2 3.4 - 10.8 x10E3/uL Final  04/04/2020 5.4 4.0 - 10.5 K/uL Final   RBC  Date Value Ref Range Status  06/20/2021 4.21 4.14 - 5.80 x10E6/uL Final  04/04/2020 3.65 (L) 4.22 - 5.81 MIL/uL Final   Hemoglobin  Date Value Ref Range Status  06/20/2021 10.9 (L) 13.0 - 17.7 g/dL Final   Hematocrit  Date Value Ref Range Status  06/20/2021 34.5 (L) 37.5 - 51.0 % Final   MCHC  Date Value Ref Range Status  06/20/2021 31.6 31.5 - 35.7 g/dL Final  04/04/2020 34.4 30.0 - 36.0 g/dL Final   [facility]  Date Value Ref Range Status  06/20/2021  25.9 (L) 26.6 - 33.0 pg Final  04/04/2020 32.3 26.0 - 34.0 pg Final   MCV  Date Value Ref Range Status  06/20/2021 82 79 - 97 fL Final  01/28/2011 96 80 - 100 fL Final   No results found for: "PLTCOUNTKUC", "LABPLAT", "POCPLA" RDW  Date Value Ref Range Status  06/20/2021 18.5 (H) 11.6 - 15.4 % Final  01/28/2011 13.3 11.5 - 14.5 % Final         Passed - HBA1C is between 0 and 7.9 and within 180 days    Hgb A1c MFr Bld  Date Value Ref Range Status  06/20/2021 6.8 (H) 4.8 - 5.6 % Final    Comment:             Prediabetes: 5.7 - 6.4          Diabetes: >6.4          Glycemic control for adults with diabetes: <7.0          Passed - Valid encounter within last 6 months    Recent Outpatient Visits  4 days ago Essential (primary) hypertension   [facility] Glean Hess, MD   3 months ago Anemia, unspecified type   [facility] Glean Hess, MD   4 months ago Annual physical exam   [facility] Glean Hess, MD   8 months ago Essential (primary) hypertension   [facility] Glean Hess, MD   1 year ago Type II diabetes mellitus with complication [facility])   [facility] Glean Hess, MD      Future Appointments            In 3 months [name] Jesse Sans, MD [facility], [place]   In 8 months [name], Jesse Sans, MD [facility], [facility]

## 2021-06-25 ENCOUNTER — Other Ambulatory Visit: Payer: Self-pay | Admitting: Internal Medicine

## 2021-06-25 DIAGNOSIS — C44729 Squamous cell carcinoma of skin of left lower limb, including hip: Secondary | ICD-10-CM | POA: Diagnosis not present

## 2021-06-25 DIAGNOSIS — R229 Localized swelling, mass and lump, unspecified: Secondary | ICD-10-CM | POA: Diagnosis not present

## 2021-06-25 DIAGNOSIS — L304 Erythema intertrigo: Secondary | ICD-10-CM | POA: Diagnosis not present

## 2021-06-25 DIAGNOSIS — Z85828 Personal history of other malignant neoplasm of skin: Secondary | ICD-10-CM | POA: Diagnosis not present

## 2021-06-25 DIAGNOSIS — L814 Other melanin hyperpigmentation: Secondary | ICD-10-CM | POA: Diagnosis not present

## 2021-06-25 DIAGNOSIS — L821 Other seborrheic keratosis: Secondary | ICD-10-CM | POA: Diagnosis not present

## 2021-06-25 DIAGNOSIS — K2100 Gastro-esophageal reflux disease with esophagitis, without bleeding: Secondary | ICD-10-CM

## 2021-06-25 DIAGNOSIS — D485 Neoplasm of uncertain behavior of skin: Secondary | ICD-10-CM | POA: Diagnosis not present

## 2021-06-25 DIAGNOSIS — C44329 Squamous cell carcinoma of skin of other parts of face: Secondary | ICD-10-CM | POA: Diagnosis not present

## 2021-06-25 DIAGNOSIS — Z08 Encounter for follow-up examination after completed treatment for malignant neoplasm: Secondary | ICD-10-CM | POA: Diagnosis not present

## 2021-06-25 DIAGNOSIS — C44222 Squamous cell carcinoma of skin of right ear and external auricular canal: Secondary | ICD-10-CM | POA: Diagnosis not present

## 2021-06-26 NOTE — Telephone Encounter (Signed)
Requested Prescriptions  Pending Prescriptions Disp Refills  . pantoprazole (PROTONIX) 40 MG tablet [Pharmacy Med Name: PANTOPRAZOLE TAB '40MG'$  DR] 180 tablet 1    Sig: TAKE 1 TABLET TWICE A DAY  (CANCEL PRESCRIPTION FOR   PANTOPRAZOLE 40 MG 1 TABLET DAILY)     Gastroenterology: Proton Pump Inhibitors Passed - 06/25/2021  2:48 PM      Passed - Valid encounter within last 12 months    Recent Outpatient Visits          6 days ago Essential (primary) hypertension   [facility] Glean Hess, MD   3 months ago Anemia, unspecified type   [facility] Glean Hess, MD   4 months ago Annual physical exam   [facility] Glean Hess, MD   8 months ago Essential (primary) hypertension   [facility] Glean Hess, MD   1 year ago Type II diabetes mellitus with complication [facility])   [facility] Glean Hess, MD      Future Appointments            In 3 months [name] Jesse Sans, MD [facility], [place]   In 8 months [name], Jesse Sans, MD [facility], [facility]

## 2021-07-10 ENCOUNTER — Telehealth: Payer: Self-pay

## 2021-07-10 NOTE — Telephone Encounter (Signed)
Pt has a procedure scheduled for tomorrow and has a few questions.

## 2021-07-11 ENCOUNTER — Ambulatory Visit
Admission: RE | Admit: 2021-07-11 | Discharge: 2021-07-11 | Disposition: A | Discharge status: Home / Self Care | Payer: Medicare HMO | Attending: Gastroenterology | Admitting: Gastroenterology

## 2021-07-11 ENCOUNTER — Encounter
Admission: RE | Disposition: A | Discharge status: Home / Self Care | Payer: Self-pay | Source: Home / Self Care | Attending: Gastroenterology

## 2021-07-11 DIAGNOSIS — D509 Iron deficiency anemia, unspecified: Secondary | ICD-10-CM | POA: Insufficient documentation

## 2021-07-11 HISTORY — PX: GIVENS CAPSULE STUDY: SHX5432

## 2021-07-11 SURGERY — IMAGING PROCEDURE, GI TRACT, INTRALUMINAL, VIA CAPSULE

## 2021-07-12 ENCOUNTER — Encounter: Payer: Self-pay | Admitting: Gastroenterology

## 2021-07-18 ENCOUNTER — Other Ambulatory Visit: Payer: Self-pay | Admitting: Internal Medicine

## 2021-07-18 DIAGNOSIS — I10 Essential (primary) hypertension: Secondary | ICD-10-CM

## 2021-07-19 DIAGNOSIS — C44722 Squamous cell carcinoma of skin of right lower limb, including hip: Secondary | ICD-10-CM | POA: Diagnosis not present

## 2021-07-19 DIAGNOSIS — I872 Venous insufficiency (chronic) (peripheral): Secondary | ICD-10-CM | POA: Diagnosis not present

## 2021-07-19 NOTE — Telephone Encounter (Signed)
Requested Prescriptions  Pending Prescriptions Disp Refills  . atenolol (TENORMIN) 50 MG tablet [Pharmacy Med Name: ATENOLOL TAB '50MG'$ ] 90 tablet 0    Sig: TAKE 1 TABLET AT BEDTIME     Cardiovascular: Beta Blockers 2 Failed - 07/18/2021 11:17 PM      Failed - Cr in normal range and within 360 days    Creatinine, Ser  Date Value Ref Range Status  06/20/2021 1.39 (H) 0.76 - 1.27 mg/dL Final         Passed - Last BP in normal range    BP Readings from Last 1 Encounters:  06/20/21 135/78         Passed - Last Heart Rate in normal range    Pulse Readings from Last 1 Encounters:  06/20/21 89         Passed - Valid encounter within last 6 months    Recent Outpatient Visits          4 weeks ago Essential (primary) hypertension   [facility] Glean Hess, MD   4 months ago Anemia, unspecified type   [facility] Glean Hess, MD   5 months ago Annual physical exam   [facility] Glean Hess, MD   9 months ago Essential (primary) hypertension   [facility] Glean Hess, MD   1 year ago Type II diabetes mellitus with complication [facility])   [facility] Glean Hess, MD      Future Appointments            In 2 months [name] Jesse Sans, MD [facility], [place]   In 7 months [name], Jesse Sans, MD [facility], [facility]

## 2021-07-22 ENCOUNTER — Telehealth: Payer: Self-pay | Admitting: Gastroenterology

## 2021-07-22 NOTE — Telephone Encounter (Signed)
Patient calling wanting results of colonoscopy. Please return call.

## 2021-07-26 NOTE — Telephone Encounter (Signed)
Pt is aware as instructed and expressed understanding....  Pt wants to know if "what is next?" Does he just need to monitor labs? As they continue to improve.Maxwell KitchenMarland Stanley

## 2021-08-01 DIAGNOSIS — L578 Other skin changes due to chronic exposure to nonionizing radiation: Secondary | ICD-10-CM | POA: Diagnosis not present

## 2021-08-01 DIAGNOSIS — C44329 Squamous cell carcinoma of skin of other parts of face: Secondary | ICD-10-CM | POA: Diagnosis not present

## 2021-08-01 DIAGNOSIS — L570 Actinic keratosis: Secondary | ICD-10-CM | POA: Diagnosis not present

## 2021-08-01 DIAGNOSIS — D0439 Carcinoma in situ of skin of other parts of face: Secondary | ICD-10-CM | POA: Diagnosis not present

## 2021-08-09 DIAGNOSIS — D509 Iron deficiency anemia, unspecified: Secondary | ICD-10-CM | POA: Diagnosis not present

## 2021-08-09 DIAGNOSIS — I480 Paroxysmal atrial fibrillation: Secondary | ICD-10-CM | POA: Diagnosis not present

## 2021-08-20 DIAGNOSIS — E1122 Type 2 diabetes mellitus with diabetic chronic kidney disease: Secondary | ICD-10-CM | POA: Diagnosis not present

## 2021-08-20 DIAGNOSIS — I129 Hypertensive chronic kidney disease with stage 1 through stage 4 chronic kidney disease, or unspecified chronic kidney disease: Secondary | ICD-10-CM | POA: Diagnosis not present

## 2021-08-20 DIAGNOSIS — I480 Paroxysmal atrial fibrillation: Secondary | ICD-10-CM | POA: Diagnosis not present

## 2021-08-20 DIAGNOSIS — N183 Chronic kidney disease, stage 3 unspecified: Secondary | ICD-10-CM | POA: Diagnosis not present

## 2021-08-21 DIAGNOSIS — G8929 Other chronic pain: Secondary | ICD-10-CM | POA: Diagnosis not present

## 2021-08-21 DIAGNOSIS — C4491 Basal cell carcinoma of skin, unspecified: Secondary | ICD-10-CM | POA: Diagnosis not present

## 2021-08-21 DIAGNOSIS — I10 Essential (primary) hypertension: Secondary | ICD-10-CM | POA: Diagnosis not present

## 2021-08-21 DIAGNOSIS — Z7984 Long term (current) use of oral hypoglycemic drugs: Secondary | ICD-10-CM | POA: Diagnosis not present

## 2021-08-21 DIAGNOSIS — Z95818 Presence of other cardiac implants and grafts: Secondary | ICD-10-CM

## 2021-08-21 DIAGNOSIS — E782 Mixed hyperlipidemia: Secondary | ICD-10-CM | POA: Diagnosis not present

## 2021-08-21 DIAGNOSIS — Z885 Allergy status to narcotic agent status: Secondary | ICD-10-CM | POA: Diagnosis not present

## 2021-08-21 DIAGNOSIS — Z7901 Long term (current) use of anticoagulants: Secondary | ICD-10-CM | POA: Diagnosis not present

## 2021-08-21 DIAGNOSIS — I4819 Other persistent atrial fibrillation: Secondary | ICD-10-CM | POA: Diagnosis not present

## 2021-08-21 DIAGNOSIS — N183 Chronic kidney disease, stage 3 unspecified: Secondary | ICD-10-CM | POA: Diagnosis not present

## 2021-08-21 DIAGNOSIS — I2510 Atherosclerotic heart disease of native coronary artery without angina pectoris: Secondary | ICD-10-CM | POA: Diagnosis not present

## 2021-08-21 DIAGNOSIS — D631 Anemia in chronic kidney disease: Secondary | ICD-10-CM | POA: Diagnosis not present

## 2021-08-21 DIAGNOSIS — Z006 Encounter for examination for normal comparison and control in clinical research program: Secondary | ICD-10-CM | POA: Diagnosis not present

## 2021-08-21 DIAGNOSIS — Z880 Allergy status to penicillin: Secondary | ICD-10-CM | POA: Diagnosis not present

## 2021-08-21 DIAGNOSIS — D509 Iron deficiency anemia, unspecified: Secondary | ICD-10-CM | POA: Diagnosis not present

## 2021-08-21 DIAGNOSIS — M1610 Unilateral primary osteoarthritis, unspecified hip: Secondary | ICD-10-CM | POA: Diagnosis not present

## 2021-08-21 DIAGNOSIS — I129 Hypertensive chronic kidney disease with stage 1 through stage 4 chronic kidney disease, or unspecified chronic kidney disease: Secondary | ICD-10-CM | POA: Diagnosis not present

## 2021-08-21 DIAGNOSIS — D6869 Other thrombophilia: Secondary | ICD-10-CM | POA: Diagnosis not present

## 2021-08-21 DIAGNOSIS — M25551 Pain in right hip: Secondary | ICD-10-CM | POA: Diagnosis not present

## 2021-08-21 DIAGNOSIS — Z01810 Encounter for preprocedural cardiovascular examination: Secondary | ICD-10-CM | POA: Diagnosis not present

## 2021-08-21 DIAGNOSIS — I480 Paroxysmal atrial fibrillation: Secondary | ICD-10-CM | POA: Diagnosis not present

## 2021-08-21 DIAGNOSIS — Z881 Allergy status to other antibiotic agents status: Secondary | ICD-10-CM | POA: Diagnosis not present

## 2021-08-21 DIAGNOSIS — Z96651 Presence of right artificial knee joint: Secondary | ICD-10-CM | POA: Diagnosis not present

## 2021-08-21 DIAGNOSIS — Z791 Long term (current) use of non-steroidal anti-inflammatories (NSAID): Secondary | ICD-10-CM | POA: Diagnosis not present

## 2021-08-21 DIAGNOSIS — E1122 Type 2 diabetes mellitus with diabetic chronic kidney disease: Secondary | ICD-10-CM | POA: Diagnosis not present

## 2021-08-21 DIAGNOSIS — D508 Other iron deficiency anemias: Secondary | ICD-10-CM | POA: Diagnosis not present

## 2021-08-21 DIAGNOSIS — Z79899 Other long term (current) drug therapy: Secondary | ICD-10-CM | POA: Diagnosis not present

## 2021-08-21 HISTORY — DX: Presence of other cardiac implants and grafts: Z95.818

## 2021-08-22 DIAGNOSIS — Z95818 Presence of other cardiac implants and grafts: Secondary | ICD-10-CM | POA: Diagnosis not present

## 2021-08-22 DIAGNOSIS — I700 Atherosclerosis of aorta: Secondary | ICD-10-CM | POA: Diagnosis not present

## 2021-08-22 DIAGNOSIS — J9811 Atelectasis: Secondary | ICD-10-CM | POA: Diagnosis not present

## 2021-08-22 DIAGNOSIS — J90 Pleural effusion, not elsewhere classified: Secondary | ICD-10-CM | POA: Diagnosis not present

## 2021-08-22 DIAGNOSIS — I480 Paroxysmal atrial fibrillation: Secondary | ICD-10-CM | POA: Diagnosis not present

## 2021-08-22 NOTE — Telephone Encounter (Signed)
I was able to speak to pt and give him advice per Dr Dorothey Baseman comments... Pt expressed understanding

## 2021-09-04 ENCOUNTER — Other Ambulatory Visit: Payer: Self-pay | Admitting: Internal Medicine

## 2021-09-04 DIAGNOSIS — I10 Essential (primary) hypertension: Secondary | ICD-10-CM

## 2021-09-04 NOTE — Telephone Encounter (Signed)
Requested Prescriptions  Pending Prescriptions Disp Refills  . losartan (COZAAR) 100 MG tablet [Pharmacy Med Name: LOSARTAN TAB '100MG'$ ] 90 tablet 1    Sig: TAKE 1/2 TABLET TWICE A DAY     Cardiovascular:  Angiotensin Receptor Blockers Failed - 09/04/2021  8:59 AM      Failed - Cr in normal range and within 180 days    Creatinine, Ser  Date Value Ref Range Status  06/20/2021 1.39 (H) 0.76 - 1.27 mg/dL Final         Passed - K in normal range and within 180 days    Potassium  Date Value Ref Range Status  06/20/2021 4.7 3.5 - 5.2 mmol/L Final  01/28/2011 4.2 3.5 - 5.1 mmol/L Final    Comment:    POTASSIUM - Slight hemolysis, interpret results with  - caution.          Passed - Patient is not pregnant      Passed - Last BP in normal range    BP Readings from Last 1 Encounters:  06/20/21 135/78         Passed - Valid encounter within last 6 months    Recent Outpatient Visits          2 months ago Essential (primary) hypertension   [facility], Jesse Sans, MD   6 months ago Anemia, unspecified type   [facility] at [facility], Jesse Sans, MD   6 months ago Annual physical exam   [place] [facility], Jesse Sans, MD   11 months ago Essential (primary) hypertension   [place] [facility], Jesse Sans, MD   1 year ago Type II diabetes mellitus with complication [facility])   [place] [facility] at [facility], Jesse Sans, MD      Future Appointments            In 1 month [name], Jesse Sans, MD [facility] at [facility], [facility]   In 5 months [name], Jesse Sans, MD [place] [facility] at [facility], [facility]

## 2021-09-10 ENCOUNTER — Other Ambulatory Visit: Payer: Self-pay | Admitting: Internal Medicine

## 2021-09-10 ENCOUNTER — Other Ambulatory Visit: Payer: Self-pay

## 2021-09-10 ENCOUNTER — Telehealth: Payer: Self-pay | Admitting: Internal Medicine

## 2021-09-10 DIAGNOSIS — Z87898 Personal history of other specified conditions: Secondary | ICD-10-CM

## 2021-09-10 MED ORDER — SCOPOLAMINE 1 MG/3DAYS TD PT72: 1.0000 | MEDICATED_PATCH | TRANSDERMAL | 0 refills | Status: DC

## 2021-09-10 MED ORDER — FLUTICASONE PROPIONATE 50 MCG/ACT NA SUSP: 2.0000 | Freq: Every day | NASAL | 2 refills | Status: DC

## 2021-09-10 NOTE — Telephone Encounter (Signed)
Pt informed and verbalized understanding.  KP 

## 2021-09-10 NOTE — Telephone Encounter (Signed)
Pt states he is going on a cruise and requesting a rx for two "sea sick" patches [facility]   [id number] [address] [place]gnacioPhone:  [phone] Fax:  [phone]    Please advise

## 2021-09-10 NOTE — Telephone Encounter (Signed)
Medication Refill - Medication: fluticasone (FLONASE) 50 MCG/ACT nasal spray  Has the patient contacted their pharmacy? Yes.    (Agent: If yes, when and what did the pharmacy advise?) has not heard back from provider's office  Preferred Pharmacy (with phone number or street name):  [facility] [place] [place] [facility] [employer] [facility] Phone:  [phone]  Fax:  [phone]     Has the patient been seen for an appointment in the last year OR does the patient have an upcoming appointment? Yes.    Agent: Please be advised that RX refills may take up to 3 business days. We ask that you follow-up with your pharmacy.

## 2021-09-10 NOTE — Telephone Encounter (Signed)
Please review.  KP

## 2021-09-24 DIAGNOSIS — M1611 Unilateral primary osteoarthritis, right hip: Secondary | ICD-10-CM | POA: Diagnosis not present

## 2021-10-04 DIAGNOSIS — H259 Unspecified age-related cataract: Secondary | ICD-10-CM | POA: Diagnosis not present

## 2021-10-04 DIAGNOSIS — D6869 Other thrombophilia: Secondary | ICD-10-CM | POA: Diagnosis not present

## 2021-10-04 DIAGNOSIS — E785 Hyperlipidemia, unspecified: Secondary | ICD-10-CM | POA: Diagnosis not present

## 2021-10-04 DIAGNOSIS — E1136 Type 2 diabetes mellitus with diabetic cataract: Secondary | ICD-10-CM | POA: Diagnosis not present

## 2021-10-04 DIAGNOSIS — I129 Hypertensive chronic kidney disease with stage 1 through stage 4 chronic kidney disease, or unspecified chronic kidney disease: Secondary | ICD-10-CM | POA: Diagnosis not present

## 2021-10-04 DIAGNOSIS — I495 Sick sinus syndrome: Secondary | ICD-10-CM | POA: Diagnosis not present

## 2021-10-04 DIAGNOSIS — E1151 Type 2 diabetes mellitus with diabetic peripheral angiopathy without gangrene: Secondary | ICD-10-CM | POA: Diagnosis not present

## 2021-10-04 DIAGNOSIS — E1122 Type 2 diabetes mellitus with diabetic chronic kidney disease: Secondary | ICD-10-CM | POA: Diagnosis not present

## 2021-10-04 DIAGNOSIS — I700 Atherosclerosis of aorta: Secondary | ICD-10-CM | POA: Diagnosis not present

## 2021-10-04 DIAGNOSIS — I4891 Unspecified atrial fibrillation: Secondary | ICD-10-CM | POA: Diagnosis not present

## 2021-10-04 DIAGNOSIS — J309 Allergic rhinitis, unspecified: Secondary | ICD-10-CM | POA: Diagnosis not present

## 2021-10-04 DIAGNOSIS — I25119 Atherosclerotic heart disease of native coronary artery with unspecified angina pectoris: Secondary | ICD-10-CM | POA: Diagnosis not present

## 2021-10-07 ENCOUNTER — Ambulatory Visit (INDEPENDENT_AMBULATORY_CARE_PROVIDER_SITE_OTHER): Payer: Medicare HMO | Admitting: Internal Medicine

## 2021-10-07 ENCOUNTER — Encounter: Payer: Self-pay | Admitting: Internal Medicine

## 2021-10-07 VITALS — BP 129/70 | HR 65 | Ht 74.0 in | Wt 173.0 lb

## 2021-10-07 DIAGNOSIS — E1122 Type 2 diabetes mellitus with diabetic chronic kidney disease: Secondary | ICD-10-CM | POA: Diagnosis not present

## 2021-10-07 DIAGNOSIS — I4819 Other persistent atrial fibrillation: Secondary | ICD-10-CM

## 2021-10-07 DIAGNOSIS — E118 Type 2 diabetes mellitus with unspecified complications: Secondary | ICD-10-CM | POA: Diagnosis not present

## 2021-10-07 DIAGNOSIS — M1611 Unilateral primary osteoarthritis, right hip: Secondary | ICD-10-CM

## 2021-10-07 DIAGNOSIS — N183 Chronic kidney disease, stage 3 unspecified: Secondary | ICD-10-CM

## 2021-10-07 DIAGNOSIS — I480 Paroxysmal atrial fibrillation: Secondary | ICD-10-CM | POA: Diagnosis not present

## 2021-10-07 DIAGNOSIS — I10 Essential (primary) hypertension: Secondary | ICD-10-CM | POA: Diagnosis not present

## 2021-10-07 NOTE — Discharge Instructions (Signed)
Instructions after Total Knee Replacement   Lexii Walsh P. Siddiq Kaluzny, Jr., M.D.     Dept. of Orthopaedics & Sports Medicine  [facility]  [address]  [place], [place]  [address]  Phone: [phone]   Fax: [phone]    DIET: Drink plenty of non-alcoholic fluids. Resume your normal diet. Include foods high in fiber.  ACTIVITY:  You may use crutches or a walker with weight-bearing as tolerated, unless instructed otherwise. You may be weaned off of the walker or crutches by your Physical Therapist.  Do NOT place pillows under the knee. Anything placed under the knee could limit your ability to straighten the knee.   Continue doing gentle exercises. Exercising will reduce the pain and swelling, increase motion, and prevent muscle weakness.   Please continue to use the TED compression stockings for 6 weeks. You may remove the stockings at night, but should reapply them in the morning. Do not drive or operate any equipment until instructed.  WOUND CARE:  Continue to use the PolarCare or ice packs periodically to reduce pain and swelling. You may bathe or shower after the staples are removed at the first office visit following surgery.  MEDICATIONS: You may resume your regular medications. Please take the pain medication as prescribed on the medication. Do not take pain medication on an empty stomach. You have been given a prescription for a blood thinner (Lovenox or Coumadin). Please take the medication as instructed. (NOTE: After completing a 2 week course of Lovenox, take one Enteric-coated aspirin once a day. This along with elevation will help reduce the possibility of phlebitis in your operated leg.) Do not drive or drink alcoholic beverages when taking pain medications.  CALL THE OFFICE FOR: Temperature above 101 degrees Excessive bleeding or drainage on the dressing. Excessive swelling, coldness, or paleness of the toes. Persistent nausea and vomiting.  FOLLOW-UP:  You  should have an appointment to return to the office in 10-14 days after surgery. Arrangements have been made for continuation of Physical Therapy (either home therapy or outpatient therapy).   [facility] Department Directory         [email or web address]       [email or web address]          Cardiology  Appointments: [place] - [phone] [place] - [phone]  Endocrinology  Appointments: [place] - [phone] [place] - [phone]  Gastroenterology  Appointments: [place] - [phone] [place] - [phone]        General Surgery   Appointments: [place] - [phone]  Internal Medicine/Family Medicine  Appointments: [place] - [phone] [place] - [phone] [place] - [phone]  Metabolic and Weigh Loss Surgery  Appointments: [place] - [phone]        Neurology  Appointments: [place] - [phone] [place] - [phone]  Neurosurgery  Appointments: [place] - [phone]  Obstetrics & Gynecology  Appointments: [place] - [phone] [place] - [phone]        Pediatrics  Appointments: [place] - [phone] [place] - [phone]  Physiatry  Appointments: [place] -[phone]  Physical Therapy  Appointments: [place] - [phone] [place] - [phone]        Podiatry  Appointments: [place] - [phone] [place] - [phone]  Pulmonology  Appointments: [place] - [phone]  Rheumatology  Appointments: [place] - [phone]        [place] Location: [facility]  [address] , [place]  [address]  [place] Location: [facility] [address] [place], [place]  [address]  [place] Location: [facility] [address] [place], [place]  [address]    

## 2021-10-07 NOTE — Progress Notes (Signed)
Date:  10/07/2021   Name:  Maxwell Stanley   DOB:  1934-07-08   MRN:  [id number]   Chief Complaint: Diabetes and Hypertension  Diabetes He presents for his follow-up diabetic visit. He has type 2 diabetes mellitus. Pertinent negatives for hypoglycemia include no headaches or tremors. Pertinent negatives for diabetes include no chest pain, no fatigue, no polydipsia and no polyuria. There are no hypoglycemic complications. Current diabetic treatment includes oral agent (dual therapy) (metformin and januvia). He is compliant with treatment all of the time.  Hypertension This is a chronic problem. The problem is controlled. Pertinent negatives include no chest pain, headaches, palpitations or shortness of breath. Past treatments include angiotensin blockers, calcium channel blockers and beta blockers. The current treatment provides significant improvement. Hypertensive end-organ damage includes kidney disease and CAD/MI.  CKD - stage 3, monitoring due to ongoing use of NSAIDS. Carotid artery stenosis - noted by Cardiology, now on Eliquis.  Planning hip replacement in 10 days.  Lab Results  Component Value Date   NA 141 06/20/2021   K 4.7 06/20/2021   CO2 21 06/20/2021   GLUCOSE 159 (H) 06/20/2021   BUN 16 06/20/2021   CREATININE 1.39 (H) 06/20/2021   CALCIUM 10.0 06/20/2021   EGFR 50 (L) 06/20/2021   GFRNONAA >60 04/04/2020   Lab Results  Component Value Date   CHOL 123 02/19/2021   HDL 39 (L) 02/19/2021   LDLCALC 61 02/19/2021   TRIG 127 02/19/2021   CHOLHDL 3.2 02/19/2021   Lab Results  Component Value Date   TSH 2.500 02/19/2021   Lab Results  Component Value Date   HGBA1C 6.8 (H) 06/20/2021   Lab Results  Component Value Date   WBC 5.2 06/20/2021   HGB 10.9 (L) 06/20/2021   HCT 34.5 (L) 06/20/2021   MCV 82 06/20/2021   PLT 238 06/20/2021   Lab Results  Component Value Date   ALT 16 02/19/2021   AST 19 02/19/2021   ALKPHOS 65 02/19/2021   BILITOT 0.4  02/19/2021   No results found for: "25OHVITD2", "25OHVITD3", "VD25OH"   Review of Systems  Constitutional:  Negative for appetite change, fatigue and unexpected weight change.  Eyes:  Negative for visual disturbance.  Respiratory:  Negative for cough, shortness of breath and wheezing.   Cardiovascular:  Negative for chest pain, palpitations and leg swelling.  Gastrointestinal:  Negative for abdominal pain and blood in stool.  Endocrine: Negative for polydipsia and polyuria.  Genitourinary:  Negative for dysuria and hematuria.  Musculoskeletal:  Positive for arthralgias and gait problem.  Skin:  Negative for color change and rash.  Neurological:  Negative for tremors, numbness and headaches.  Psychiatric/Behavioral:  Negative for dysphoric mood.     Patient Active Problem List   Diagnosis Date Noted   Bilateral carotid artery stenosis 05/22/2021   Anemia 02/28/2021   Total knee replacement status 04/16/2020   Acquired thrombophilia ([place]) 02/15/2020   Primary osteoarthritis of both knees 05/19/2019   Gastroesophageal reflux disease with esophagitis 02/02/2019   Personal history of other malignant neoplasm of skin 12/17/2018   Persistent atrial fibrillation ([place]) 10/29/2018   Reducible right inguinal hernia 09/16/2018   DDD (degenerative disc disease), lumbar 12/23/2017   Primary osteoarthritis of right hip 12/23/2017   Onychomycosis of toenail 08/19/2017   Allergic rhinitis 08/08/2016   Carpal tunnel syndrome on left 12/12/2015   Chronic maxillary sinusitis 04/09/2015   Type II diabetes mellitus with complication ([place]) 57/32/2025   Chronic low back  pain 09/21/2014   Abnormal prostate specific antigen 04/17/2014   Enlarged prostate with lower urinary tract symptoms (LUTS) 04/17/2014   CKD stage 3 secondary to diabetes ([place]) 04/17/2014   Hyperlipidemia associated with type 2 diabetes mellitus ([place]) 04/17/2014   ED (erectile dysfunction) of organic origin 04/17/2014   Essential  (primary) hypertension 04/17/2014   Back muscle spasm 04/17/2014   Degenerative arthritis of finger 04/17/2014   Breast development in males 04/17/2014   Leg varices 04/17/2014   EDEMA 08/24/2009    Allergies  Allergen Reactions   Codeine Other (See Comments)    Made his "Wild"   Ceftriaxone Rash   Penicillin G Rash    Did it involve swelling of the face/tongue/throat, SOB, or low BP? No Did it involve sudden or severe rash/hives, skin peeling, or any reaction on the inside of your mouth or nose? No Did you need to seek medical attention at a hospital or doctor's office? No When did it last happen?       If all above answers are "NO", may proceed with cephalosporin use.  IgE = 35 (WNL) on 01/18/2020     Past Surgical History:  Procedure Laterality Date   ANKLE ARTHROSCOPY WITH OPEN REDUCTION INTERNAL FIXATION (ORIF) Left 1998   COLONOSCOPY     COLONOSCOPY WITH PROPOFOL N/A 05/28/2021   Procedure: COLONOSCOPY WITH PROPOFOL;  Surgeon: Lucilla Lame, MD;  [facility];  Service: Endoscopy;  Laterality: N/A;   ESOPHAGOGASTRODUODENOSCOPY (EGD) WITH PROPOFOL N/A 05/28/2021   Procedure: ESOPHAGOGASTRODUODENOSCOPY (EGD) WITH PROPOFOL;  Surgeon: Lucilla Lame, MD;  [facility];  Service: Endoscopy;  Laterality: N/A;   ETHMOIDECTOMY Left 03/08/2020   Procedure: TOTAL ETHMOIDECTOMY;  Surgeon: Margaretha Sheffield, MD;  Location: [place];  Service: ENT;  Laterality: Left;   FRONTAL SINUS EXPLORATION Left 03/08/2020   Procedure: FRONTAL SINUS EXPLORATION;  Surgeon: Margaretha Sheffield, MD;  Location: [place];  Service: ENT;  Laterality: Left;   GIVENS CAPSULE STUDY N/A 07/11/2021   Procedure: GIVENS CAPSULE STUDY;  Surgeon: Lucilla Lame, MD;  Location: [facility]COPY;  Service: Endoscopy;  Laterality: N/A;   HEMORRHOID SURGERY     HERNIA REPAIR Left [id number]   umbilical and left inguinal   IMAGE GUIDED SINUS SURGERY N/A 03/08/2020   Procedure: IMAGE GUIDED SINUS  SURGERY;  Surgeon: Margaretha Sheffield, MD;  Location: [place];  Service: ENT;  Laterality: N/A;  placed disk on OR charge nurse desk 2-21 kp PLACED 2ND DISK ON OR CHARGE NURSE DESK 02-28-20 KP 3RD DISK ON OR CHARGE NURSE DESK 2-25- [place] Right 11/12/2018   Procedure: HERNIA REPAIR INGUINAL ADULT;  Surgeon: Fredirick Maudlin, MD;  Location: [facility] ORS;  Service: General;  Laterality: Right;   INGUINAL HERNIA REPAIR Left 2012   KNEE ARTHROPLASTY Right 04/16/2020   Procedure: COMPUTER ASSISTED TOTAL KNEE ARTHROPLASTY;  Surgeon: Dereck Leep, MD;  Location: [facility]S;  Service: Orthopedics;  Laterality: Right;   KNEE ARTHROSCOPY Right 2006   MAXILLARY ANTROSTOMY Left 03/08/2020   Procedure: MAXILLARY ANTROSTOMY WITH TISSUE REMOVAL;  Surgeon: Margaretha Sheffield, MD;  Location: [place];  Service: ENT;  Laterality: Left;   SEPTOPLASTY N/A 03/08/2020   Procedure: SEPTOPLASTY;  Surgeon: Margaretha Sheffield, MD;  Location: [place];  Service: ENT;  Laterality: N/A;   skin cancer resection Right 04/07/2018   ON hand.   THUMB ARTHROSCOPY Right 2013   tendon flap to joint   TONSILLECTOMY     UPPER GI  ENDOSCOPY      Social History   Tobacco Use   Smoking status: Never   Smokeless tobacco: Never  Vaping Use   Vaping Use: Never used  Substance Use Topics   Alcohol use: No    Alcohol/week: 0.0 standard drinks of alcohol   Drug use: No     Medication list has been reviewed and updated.  Current Meds  Medication Sig   acetaminophen (TYLENOL) 500 MG tablet Take 1,000 mg by mouth daily.   amLODipine (NORVASC) 2.5 MG tablet TAKE 1 TABLET DAILY   apixaban (ELIQUIS) 2.5 MG TABS tablet Take by mouth.   atenolol (TENORMIN) 50 MG tablet TAKE 1 TABLET AT BEDTIME   celecoxib (CELEBREX) 100 MG capsule Take 100 mg by mouth 2 (two) times daily.   cholecalciferol (VITAMIN D) 1000 UNITS tablet Take 1,000 Units by mouth daily.   clotrimazole (LOTRIMIN) 1 % cream Apply 1  application topically 2 (two) times daily. (Patient taking differently: Apply 1 application  topically 2 (two) times daily as needed (fungus).)   docusate sodium (COLACE) 100 MG capsule Take 200 mg by mouth daily.    ferrous sulfate 325 (65 FE) MG EC tablet Take 1 tablet by mouth daily.   finasteride (PROSCAR) 5 MG tablet TAKE 1 TABLET DAILY   fluorouracil (EFUDEX) 5 % cream Apply topically.   fluticasone (FLONASE) 50 MCG/ACT nasal spray Place 2 sprays into both nostrils daily.   glucose blood (ONETOUCH ULTRA) test strip Use as instructed   losartan (COZAAR) 100 MG tablet TAKE 1/2 TABLET TWICE A DAY   Melatonin 10 MG CAPS Take 10 mg by mouth at bedtime.   metFORMIN (GLUCOPHAGE-XR) 500 MG 24 hr tablet TAKE 1 TABLET DAILY WITH   BREAKFAST AND 2 TABLETS    EVERY EVENING. (Patient taking differently: 500 mg. TAKE 1 TABLET DAILY WITH   BREAKFAST AND 1 tablet at bedtime)   Multiple Vitamin (MULTIVITAMIN WITH MINERALS) TABS tablet Take 0.5 tablets by mouth in the morning and at bedtime.   mupirocin ointment (BACTROBAN) 2 % APPLY TO AFFECTED AREA EVERY DAY   OneTouch Delica Lancets 24M MISC 1 each by Does not apply route daily. Use to test blood sugar once daily.   pantoprazole (PROTONIX) 40 MG tablet TAKE 1 TABLET TWICE A DAY  (CANCEL PRESCRIPTION FOR   PANTOPRAZOLE 40 MG 1 TABLET DAILY)   Probiotic Product (PROBIOTIC DAILY PO) Take 1 tablet by mouth daily.   rosuvastatin (CRESTOR) 20 MG tablet Take 1 tablet (20 mg total) by mouth at bedtime.   sitaGLIPtin (JANUVIA) 100 MG tablet Take 1 tablet (100 mg total) by mouth daily. (Patient taking differently: Take 50 mg by mouth daily.)   tacrolimus (PROTOPIC) 0.1 % ointment Apply topically 2 (two) times daily.   tamsulosin (FLOMAX) 0.4 MG CAPS capsule Take 1 capsule (0.4 mg total) by mouth daily after supper.   triamcinolone cream (KENALOG) 0.1 % Apply 1 application topically daily as needed for irritation.       06/20/2021    8:02 AM 02/28/2021   10:43 AM  02/19/2021    9:03 AM 10/09/2020    8:26 AM  GAD 7 : Generalized Anxiety Score  Nervous, Anxious, on Edge 0 0 0 0  Control/stop worrying 0 0 0 0  Worry too much - different things 0 0 0 0  Trouble relaxing 0 0 0 0  Restless 0 0 0 0  Easily annoyed or irritable 0 0 0 0  Afraid - awful might happen  0 0 0 0  Total GAD 7 Score 0 0 0 0  Anxiety Difficulty Not difficult at all          06/20/2021    8:02 AM 02/28/2021   10:42 AM 02/19/2021    9:03 AM  Depression screen PHQ 2/9  Decreased Interest 0 0 0  Down, Depressed, Hopeless 0 0 0  PHQ - 2 Score 0 0 0  Altered sleeping 0 0 0  Tired, decreased energy 0 3 3  Change in appetite 2 0 0  Feeling bad or failure about yourself  0 0 0  Trouble concentrating 0 0 0  Moving slowly or fidgety/restless 0 0 0  Suicidal thoughts 0 0 0  PHQ-9 Score 2 3 3   Difficult doing work/chores Not difficult at all Not difficult at all Not difficult at all    BP Readings from Last 3 Encounters:  10/07/21 129/70  06/20/21 135/78  05/28/21 (!) 120/52    Physical Exam Vitals and nursing note reviewed.  Constitutional:      General: He is not in acute distress.    Appearance: He is well-developed.  HENT:     Head: Normocephalic and atraumatic.  Pulmonary:     Effort: Pulmonary effort is normal. No respiratory distress.  Skin:    General: Skin is warm and dry.     Findings: No rash.  Neurological:     Mental Status: He is alert and oriented to person, place, and time.  Psychiatric:        Mood and Affect: Mood normal.        Behavior: Behavior normal.     Wt Readings from Last 3 Encounters:  10/07/21 173 lb (78.5 kg)  06/20/21 170 lb (77.1 kg)  05/28/21 170 lb (77.1 kg)    BP 129/70   Pulse 65   Ht 6' 2"  (1.88 m)   Wt 173 lb (78.5 kg)   SpO2 96%   BMI 22.21 kg/m   Assessment and Plan: 1. Type II diabetes mellitus with complication (HCC) Clinically stable by exam and report without s/s of hypoglycemia. DM complicated by  hypertension and dyslipidemia. Tolerating medications well without side effects or other concerns.  Cut back on metformin to one tablet twice a day. - Hemoglobin A1c  2. Essential (primary) hypertension Clinically stable exam with well controlled BP. Tolerating medications without side effects at this time. Pt to continue current regimen and low sodium diet; benefits of regular exercise as able discussed. - CBC with Differential/Platelet  3. CKD stage 3 secondary to diabetes [facility]) Monitoring regularly - Comprehensive metabolic panel  4. Primary osteoarthritis of right hip THA planned for 2 weeks Hopefully will be off Eliquis at that point   Partially dictated using [place]. Any errors are unintentional.  Halina Maidens, MD [facility]  10/07/2021

## 2021-10-08 LAB — CBC WITH DIFFERENTIAL/PLATELET
Basophils Absolute: 0.1 10*3/uL (ref 0.0–0.2)
Basos: 1 %
EOS (ABSOLUTE): 0.5 10*3/uL — ABNORMAL HIGH (ref 0.0–0.4)
Eos: 8 %
Hematocrit: 36 % — ABNORMAL LOW (ref 37.5–51.0)
Hemoglobin: 12 g/dL — ABNORMAL LOW (ref 13.0–17.7)
Immature Grans (Abs): 0 10*3/uL (ref 0.0–0.1)
Immature Granulocytes: 0 %
Lymphocytes Absolute: 1.7 10*3/uL (ref 0.7–3.1)
Lymphs: 26 %
MCH: 30.2 pg (ref 26.6–33.0)
MCHC: 33.3 g/dL (ref 31.5–35.7)
MCV: 91 fL (ref 79–97)
Monocytes Absolute: 0.6 10*3/uL (ref 0.1–0.9)
Monocytes: 10 %
Neutrophils Absolute: 3.6 10*3/uL (ref 1.4–7.0)
Neutrophils: 55 %
Platelets: 236 10*3/uL (ref 150–450)
RBC: 3.97 x10E6/uL — ABNORMAL LOW (ref 4.14–5.80)
RDW: 13.3 % (ref 11.6–15.4)
WBC: 6.5 10*3/uL (ref 3.4–10.8)

## 2021-10-08 LAB — COMPREHENSIVE METABOLIC PANEL
ALT: 16 IU/L (ref 0–44)
AST: 25 IU/L (ref 0–40)
Albumin/Globulin Ratio: 1.9 (ref 1.2–2.2)
Albumin: 4.2 g/dL (ref 3.7–4.7)
Alkaline Phosphatase: 92 IU/L (ref 44–121)
BUN/Creatinine Ratio: 16 (ref 10–24)
BUN: 21 mg/dL (ref 8–27)
Bilirubin Total: 0.3 mg/dL (ref 0.0–1.2)
CO2: 21 mmol/L (ref 20–29)
Calcium: 9.2 mg/dL (ref 8.6–10.2)
Chloride: 104 mmol/L (ref 96–106)
Creatinine, Ser: 1.33 mg/dL — ABNORMAL HIGH (ref 0.76–1.27)
Globulin, Total: 2.2 g/dL (ref 1.5–4.5)
Glucose: 126 mg/dL — ABNORMAL HIGH (ref 70–99)
Potassium: 4.8 mmol/L (ref 3.5–5.2)
Sodium: 138 mmol/L (ref 134–144)
Total Protein: 6.4 g/dL (ref 6.0–8.5)
eGFR: 53 mL/min/{1.73_m2} — ABNORMAL LOW (ref >59)

## 2021-10-08 LAB — HEMOGLOBIN A1C
Est. average glucose Bld gHb Est-mCnc: 140 mg/dL
Hgb A1c MFr Bld: 6.5 % — ABNORMAL HIGH (ref 4.8–5.6)

## 2021-10-09 ENCOUNTER — Encounter
Admission: RE | Admit: 2021-10-09 | Discharge: 2021-10-09 | Disposition: A | Discharge status: Home / Self Care | Payer: Medicare HMO | Source: Ambulatory Visit | Attending: Orthopedic Surgery | Admitting: Orthopedic Surgery

## 2021-10-09 ENCOUNTER — Telehealth: Payer: Self-pay | Admitting: *Deleted

## 2021-10-09 VITALS — BP 129/89 | HR 70 | Resp 14 | Ht 74.0 in | Wt 174.2 lb

## 2021-10-09 DIAGNOSIS — E118 Type 2 diabetes mellitus with unspecified complications: Secondary | ICD-10-CM

## 2021-10-09 DIAGNOSIS — M1611 Unilateral primary osteoarthritis, right hip: Secondary | ICD-10-CM

## 2021-10-09 DIAGNOSIS — Z01818 Encounter for other preprocedural examination: Secondary | ICD-10-CM

## 2021-10-09 DIAGNOSIS — N183 Chronic kidney disease, stage 3 unspecified: Secondary | ICD-10-CM

## 2021-10-09 DIAGNOSIS — Z01812 Encounter for preprocedural laboratory examination: Secondary | ICD-10-CM | POA: Diagnosis not present

## 2021-10-09 DIAGNOSIS — E1122 Type 2 diabetes mellitus with diabetic chronic kidney disease: Secondary | ICD-10-CM

## 2021-10-09 LAB — URINALYSIS, ROUTINE W REFLEX MICROSCOPIC
Bacteria, UA: NONE SEEN
Bilirubin Urine: NEGATIVE
Glucose, UA: NEGATIVE mg/dL
Hgb urine dipstick: NEGATIVE
Ketones, ur: NEGATIVE mg/dL
Leukocytes,Ua: NEGATIVE
Nitrite: NEGATIVE
Protein, ur: 30 mg/dL — AB
Specific Gravity, Urine: 1.029 (ref 1.005–1.030)
pH: 5 (ref 5.0–8.0)

## 2021-10-09 LAB — SEDIMENTATION RATE: Sed Rate: 19 mm/hr (ref 0–20)

## 2021-10-09 LAB — TYPE AND SCREEN
ABO/RH(D): O POS
Antibody Screen: NEGATIVE

## 2021-10-09 LAB — SURGICAL PCR SCREEN
MRSA, PCR: NEGATIVE
Staphylococcus aureus: POSITIVE — AB

## 2021-10-09 LAB — C-REACTIVE PROTEIN: CRP: 0.9 mg/dL (ref <1.0)

## 2021-10-09 NOTE — Telephone Encounter (Signed)
-----   Message from Karen Kitchens, NP sent at 10/09/2021  3:46 PM EDT ----- Regarding: Request for pre-operative cardiac clearance Request for pre-operative cardiac clearance:  1. What type of surgery is being performed?  RIGHT TOTAL HIP ARTHROPLASTY  2. When is this surgery scheduled?  10/21/2021  3. Type of clearance being requested (medical, pharmacy, both). BOTH   4. Are there any medications that need to be held prior to surgery? APIXABAN  5. Practice name and name of physician performing surgery?  Performing surgeon: Dr. Skip Estimable, MD Requesting clearance: Honor Loh, FNP-C    6. Anesthesia type (none, local, MAC, general)? GENERAL  7. What is the office phone and fax number?   Phone: [phone] Fax: [phone]  ATTENTION: Unable to create telephone message as per your standard workflow. Directed by [facility] providers to send requests for cardiac clearance to this pool for appropriate distribution to provider covering pre-operative clearances.   Honor Loh, MSN, APRN, FNP-C, CEN [facility]  Peri-operative Services Nurse Practitioner Phone: [phone] 10/09/21 3:46 PM

## 2021-10-09 NOTE — Patient Instructions (Addendum)
Your procedure is scheduled on:10-21-21 Monday Report to the Registration Desk on the 1st floor of the [facility].Then proceed to the 2nd floor Surgery Desk To find out your arrival time, please call [phone] between 1PM - 3PM on:10-18-21 Friday If your arrival time is 6:00 am, do not arrive prior to that time as the [facility] entrance doors do not open until 6:00 am.  REMEMBER: Instructions that are not followed completely may result in serious medical risk, up to and including death; or upon the discretion of your surgeon and anesthesiologist your surgery may need to be rescheduled.  Do not eat food after midnight the night before surgery.  No gum chewing, lozengers or hard candies.  You may however, drink Water up to 2 hours before you are scheduled to arrive for your surgery. Do not drink anything within 2 hours of your scheduled arrival time.  Type 1 and Type 2 diabetics should only drink water.  In addition, your doctor has ordered for you to drink the provided  Gatorade G2 Drinking this carbohydrate drink up to two hours before surgery helps to reduce insulin resistance and improve patient outcomes. Please complete drinking 2 hours prior to scheduled arrival time.  TAKE THESE MEDICATIONS THE MORNING OF SURGERY WITH A SIP OF WATER: -amLODipine (NORVASC) -finasteride (PROSCAR) -tamsulosin (FLOMAX) -pantoprazole (PROTONIX)-take one the night before and one on the morning of surgery - helps to prevent nausea after surgery.)  Stop apixaban (ELIQUIS)3 days prior to surgery-Last dose will be on 10-17-21 Thursday  Stop your metFORMIN (GLUCOPHAGE-XR) 2 days prior to surgery-Last dose will be on 10-18-21 Friday  One week prior to surgery: Stop Anti-inflammatories (NSAIDS) such asmeloxicam (MOBIC), Advil, Aleve, Ibuprofen, Motrin, Naproxen, Naprosyn and Aspirin based products such as Excedrin, Goodys Powder, BC Powder.You may however, continue to take Tylenol if needed for pain  up until the day of surgery.  Stop ANY OVER THE COUNTER supplements/vitamins 7 days prior to surgery (vitamin d, osteo-bi-flex, multivitamin, probiotic) You may continue your melatonin up until the night prior to surgery  No Alcohol for 24 hours before or after surgery.  No Smoking including e-cigarettes for 24 hours prior to surgery.  No chewable tobacco products for at least 6 hours prior to surgery.  No nicotine patches on the day of surgery.  Do not use any "recreational" drugs for at least a week prior to your surgery.  Please be advised that the combination of cocaine and anesthesia may have negative outcomes, up to and including death. If you test positive for cocaine, your surgery will be cancelled.  On the morning of surgery brush your teeth with toothpaste and water, you may rinse your mouth with mouthwash if you wish. Do not swallow any toothpaste or mouthwash.  Use CHG Soap as directed on instruction sheet.  Do not wear jewelry, make-up, hairpins, clips or nail polish.  Do not wear lotions, powders, or perfumes.   Do not shave body from the neck down 48 hours prior to surgery just in case you cut yourself which could leave a site for infection.  Also, freshly shaved skin may become irritated if using the CHG soap.  Contact lenses, hearing aids and dentures may not be worn into surgery.  Do not bring valuables to the hospital. [facility] is not responsible for any missing/lost belongings or valuables.   Notify your doctor if there is any change in your medical condition (cold, fever, infection).  Wear comfortable clothing (specific to your surgery type)  to the hospital.  After surgery, you can help prevent lung complications by doing breathing exercises.  Take deep breaths and cough every 1-2 hours. Your doctor may order a device called an Incentive Spirometer to help you take deep breaths. When coughing or sneezing, hold a pillow firmly against your incision with both  hands. This is called "splinting." Doing this helps protect your incision. It also decreases belly discomfort.  If you are being admitted to the hospital overnight, leave your suitcase in the car. After surgery it may be brought to your room.  If you are being discharged the day of surgery, you will not be allowed to drive home. You will need a responsible adult (18 years or older) to drive you home and stay with you that night.   If you are taking public transportation, you will need to have a responsible adult (18 years or older) with you. Please confirm with your physician that it is acceptable to use public transportation.   Please call the [facility]. at [phone] if you have any questions about these instructions.  Surgery Visitation Policy:  Patients undergoing a surgery or procedure may have two family members or support persons with them as long as the person is not COVID-19 positive or experiencing its symptoms.   Inpatient Visitation:    Visiting hours are 7 a.m. to 8 p.m. Up to four visitors are allowed at one time in a patient room, including children. The visitors may rotate out with other people during the day. One designated support person (adult) may remain overnight.    How to Use an Incentive Spirometer An incentive spirometer is a tool that measures how well you are filling your lungs with each breath. Learning to take long, deep breaths using this tool can help you keep your lungs clear and active. This may help to reverse or lessen your chance of developing breathing (pulmonary) problems, especially infection. You may be asked to use a spirometer: After a surgery. If you have a lung problem or a history of smoking. After a long period of time when you have been unable to move or be active. If the spirometer includes an indicator to show the highest number that you have reached, your health care provider or respiratory therapist will help you set  a goal. Keep a log of your progress as told by your health care provider. What are the risks? Breathing too quickly may cause dizziness or cause you to pass out. Take your time so you do not get dizzy or light-headed. If you are in pain, you may need to take pain medicine before doing incentive spirometry. It is harder to take a deep breath if you are having pain. How to use your incentive spirometer  Sit up on the edge of your bed or on a chair. Hold the incentive spirometer so that it is in an upright position. Before you use the spirometer, breathe out normally. Place the mouthpiece in your mouth. Make sure your lips are closed tightly around it. Breathe in slowly and as deeply as you can through your mouth, causing the piston or the ball to rise toward the top of the chamber. Hold your breath for 3-5 seconds, or for as long as possible. If the spirometer includes a coach indicator, use this to guide you in breathing. Slow down your breathing if the indicator goes above the marked areas. Remove the mouthpiece from your mouth and breathe out normally. The piston or ball  will return to the bottom of the chamber. Rest for a few seconds, then repeat the steps 10 or more times. Take your time and take a few normal breaths between deep breaths so that you do not get dizzy or light-headed. Do this every 1-2 hours when you are awake. If the spirometer includes a goal marker to show the highest number you have reached (best effort), use this as a goal to work toward during each repetition. After each set of 10 deep breaths, cough a few times. This will help to make sure that your lungs are clear. If you have an incision on your chest or abdomen from surgery, place a pillow or a rolled-up towel firmly against the incision when you cough. This can help to reduce pain while taking deep breaths and coughing. General tips When you are able to get out of bed: Walk around often. Continue to take deep  breaths and cough in order to clear your lungs. Keep using the incentive spirometer until your health care provider says it is okay to stop using it. If you have been in the hospital, you may be told to keep using the spirometer at home. Contact a health care provider if: You are having difficulty using the spirometer. You have trouble using the spirometer as often as instructed. Your pain medicine is not giving enough relief for you to use the spirometer as told. You have a fever. Get help right away if: You develop shortness of breath. You develop a cough with bloody mucus from the lungs. You have fluid or blood coming from an incision site after you cough. Summary An incentive spirometer is a tool that can help you learn to take long, deep breaths to keep your lungs clear and active. You may be asked to use a spirometer after a surgery, if you have a lung problem or a history of smoking, or if you have been inactive for a long period of time. Use your incentive spirometer as instructed every 1-2 hours while you are awake. If you have an incision on your chest or abdomen, place a pillow or a rolled-up towel firmly against your incision when you cough. This will help to reduce pain. Get help right away if you have shortness of breath, you cough up bloody mucus, or blood comes from your incision when you cough. This information is not intended to replace advice given to you by your health care provider. Make sure you discuss any questions you have with your health care provider. Document Revised: 03/14/2019 Document Reviewed: 03/14/2019 Elsevier Patient Education  [place].

## 2021-10-09 NOTE — Telephone Encounter (Signed)
He is a patient of [facility].

## 2021-10-10 ENCOUNTER — Telehealth: Payer: Self-pay | Admitting: Urgent Care

## 2021-10-10 NOTE — Progress Notes (Signed)
  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 Skip Estimable.  Patient presented to the PAT clinic on 10/09/2021 for his PAT interview and lab testing.  During his interview, patient's medication reconciliation was completed by PAT RN.  Patient reportedly brought all medications with him to clinic for staff review.  Cardiac clearance submitted to patient's primary cardiologist prior to surgery.  Clearance was sent based on what is currently in [facility] with regards to patient's medication.  Patient has apixaban 2.5 mg twice daily listed.  Received return clearance from cardiology team advising that patient may proceed with surgery at the number LOW risk, however there was notation that patient is no longer on apixaban.  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  In review of the documentation available and Care Everywhere, it appears that patient contacted electrophysiology office yesterday regarding anticoagulation vs DAPT regimen prior to surgery.  Patient was given the choice of continuing apixaban or switching to DAPT regimen x 4 months.  Due to cost prohibitive issues associated with apixaban, patient elected to switch to DAPT regimen.  Patient was contacted via telephone by PAT RN and placed on speaker with PAT APP on 10/10/2021.  Reviewed medications and discussed details regarding upcoming surgery.  Patient stated that he was still currently taking the apixaban as ordered.  He has made the decision to remain on apixaban until after surgery.  Patient has picked up the clopidogrel from the pharmacy.  He was encouraged to place this bottle aside to prevent any potential medication  administration errors prior to surgery.  Clarified with patient that he is to hold apixaban dose for 3 days prior to surgery; verbalized understanding.  Patient advising that his plans are to start DAPT regimen postoperatively when cleared by Dr. Marry Guan.  We will ensure that cardiology team is aware of this. Copy of this note will be forwarded to Dr. Nehemiah Massed and Jettie Booze, PA-C for review.  Honor Loh, MSN, APRN, FNP-C, CEN [facility]  Peri-operative Services Nurse Practitioner Phone: [phone] 10/10/21 12:59 PM  NOTE: This note has been prepared using Dragon dictation software. Despite my best ability to proofread, there is always the potential that unintentional transcriptional errors may still occur from this process.

## 2021-10-11 DIAGNOSIS — M1611 Unilateral primary osteoarthritis, right hip: Secondary | ICD-10-CM | POA: Diagnosis not present

## 2021-10-14 ENCOUNTER — Encounter: Payer: Self-pay | Admitting: Orthopedic Surgery

## 2021-10-14 NOTE — Progress Notes (Addendum)
Perioperative Services  Pre-Admission/Anesthesia Testing Clinical Review  Date: 10/18/21  Patient Demographics:  Name: Maxwell Stanley DOB:   April 01, 1934 MRN:   [id number]  Planned Surgical Procedure(s):    Case: [id number] Date/Time: 10/21/21 0700   Procedure: TOTAL HIP ARTHROPLASTY (Right: Hip)   Anesthesia type: Choice   Pre-op diagnosis: PRIMARY OSTEOARTHRITIS OF RIGHT HIP.   Location: [facility] OR ROOM 01 / [place] ORS FOR ANESTHESIA GROUP   Surgeons: Dereck Leep, MD   NOTE: Available PAT nursing documentation and vital signs have been reviewed. Clinical nursing staff has updated patient's PMH/PSHx, current medication list, and drug allergies/intolerances to ensure comprehensive history available to assist in medical decision making as it pertains to the aforementioned surgical procedure and anticipated anesthetic course. Extensive review of available clinical information performed. [place] PMH and PSHx updated with any diagnoses/procedures that  may have been inadvertently omitted during his intake with the pre-admission testing department's nursing staff.  Clinical Discussion:  Maxwell Stanley is a 87 y.o. male who is submitted for pre-surgical anesthesia review and clearance prior to him undergoing the above procedure. Patient has never been a smoker. Pertinent PMH includes: atrial fibrillation, diastolic dysfunction, aortic atherosclerosis, HTN, HLD, T2DM, CKD-III, GERD (on daily PPI), IDA, thrombophilia, BPH with BOO/LUTS, OA, lumbar DDD, lumbar spinal stenosis.  Patient is followed by cardiology Nehemiah Massed, MD). He was last seen in the cardiology clinic on 05/22/2021; notes reviewed.  At the time of his clinic visit, patient doing well overall from a cardiovascular perspective.  He denied any episodes of chest pain, shortness breath, PND, orthopnea, significant peripheral edema, palpitations, or presyncope/syncope.  Patient with occasional vertiginous symptoms associated with  position changes.  Patient with a past medical history significant for cardiovascular diagnoses.  Most recent myocardial perfusion imaging study was performed on 11/19/2020 revealing a normal left ventricular systolic function with an EF of 61%.  There were no regional wall motion abnormalities.  There was no evidence of stress-induced myocardial ischemia or arrhythmia; no scintigraphic evidence of scar.  Study determined to be normal overall.  Most recent TTE was performed on 11/19/2020 revealing a normal left ventricular systolic function with mild concentric LVH; LVEF >55%. Left ventricular diastolic Doppler parameters consistent with abnormal relaxation (G1DD).  There was moderate biatrial and mild right ventricular enlargement.  Trivial aortic and pulmonary, in addition to moderate mitral and tricuspid valve regurgitation noted.  There is no evidence of a significant transvalvular gradient to suggest stenosis.  Patient with an atrial fibrillation diagnosis; CHA2DS2-VASc Score = 5 (age x 2, HTN, vascular disease history, T2DM). His rate and rhythm are currently being maintained on oral atenolol. He is chronically anticoagulated using apixaban.  Patient was off of his oral anticoagulation therapy at the time of his visit due to significant anemia with hemoglobin as low as 7.6.  Patient has been seen by GI with no apparent etiology of blood loss being discovered.  Patient also having financial constraints associated with NOAC therapy.  He was seeking alternatives.  LAA closure (Watchman) was discussed and patient was interested.  He was referred to electrophysiology for further evaluation. Blood pressure reasonably controlled at 130/70 mmHg on currently prescribed CCB (amlodipine), beta-blocker (atenolol), and ARB (losartan) therapies. He is on atorvastatin for his HLD diagnosis and further ASCVD prevention. T2DM well controlled on currently prescribed regimen; last HgbA1c was 6.7% when checked on  02/19/2021.  Of note, lab has been rechecked since last seen by cardiology with further improvement down to 6.5% on 10/07/2021.  Patient does not have an OSAH diagnosis.  Functional capacity limited by age and medical comorbidities, however patient still felt to be able to achieve at least 4 METS of activity without experiencing any degree of angina/anginal equivalent symptoms.  No changes were made to his medication regimen.  Patient to follow-up with outpatient cardiology in 6 months or sooner if needed.  Since being seen by his primary cardiologist, patient has been seen in evaluation by electrophysiology Marcello Moores, MD); notes reviewed.  LAA closure was further discussed as feasible option for ongoing dysrhythmia management in the setting of patient with significant anemia and financial concerns with currently prescribed therapy.  Patient ultimately underwent LAA closure device placement (Watchman) on 08/21/2021.  Procedure was uncomplicated.  And follow-up with his electrophysiologist and cardiologist, patient was given the option of continuing apixaban for 4 months status post LAA closure versus beginning daily DAPT therapy (ASA + clopidogrel).  Patient elected to pursue the latter option, however given upcoming orthopedic procedure, patient electing to defer transition in therapy until his postoperative period.  Maxwell Stanley is scheduled for an elective RIGHT TOTAL HIP ARTHROPLASTY on 10/21/2021 with Dr. Skip Estimable, MD.  Given patient's past medical history significant for cardiovascular diagnoses, presurgical cardiac clearance was sought by the PAT team. Per cardiology, "this patient is optimized for surgery and may proceed with the planned procedural course with a LOW risk of significant perioperative cardiovascular complications".  In review of his medication reconciliation, it is noted that patient is currently on prescribed daily anticoagulation therapy. He has been instructed on recommendations  for holding his apixaban for 3 days prior to his procedure with plans to restart as soon as postoperative bleeding risk felt to be minimized by his attending surgeon. The patient has been instructed that his last dose of his anticoagulant will be on 10/17/2021.  Of note, postoperatively, patient will transition from daily apixaban therapy to DAPT (ASA + clopidogrel) x 4 months status post LAA closure device placement.  Patient reports previous perioperative complications with anesthesia in the past. Patient has a PMH (+) for PONV. Symptoms and history of PONV will be discussed with patient by anesthesia team on the day of her procedure. Interventions will be ordered as deemed necessary based on patient's individual care needs as determined by anesthesiologist. In review of the available records, it is noted that patient underwent a general anesthetic course at [facility] (ASA III) in 08/2021 without documented complications.      10/09/2021    1:36 PM 10/07/2021    9:37 AM 06/20/2021    8:15 AM  Vitals with BMI  Height '6\' 2"'$  '6\' 2"'$    Weight 174 lbs 3 oz 173 lbs   BMI 76.28 31.5   Systolic 176 160 737  Diastolic 89 70 78  Pulse 70 65     Providers/Specialists:   NOTE: Primary physician provider listed below. Patient may have been seen by APP or partner within same practice.   PROVIDER ROLE / SPECIALTY LAST OV  Hooten, Laurice Record, MD Orthopedics (Surgeon) 10/11/2021  Glean Hess, MD Primary Care Provider 10/07/2021  Serafina Royals, MD Cardiology 05/22/2021  Mylinda Latina, MD Electrophysiology 10/07/2021   Allergies:  Codeine, Ceftriaxone, and Penicillin g  Current Home Medications:   No current facility-administered medications for this encounter.    acetaminophen (TYLENOL) 500 MG tablet   amLODipine (NORVASC) 2.5 MG tablet   apixaban (ELIQUIS) 2.5 MG TABS tablet   atenolol (TENORMIN) 50 MG tablet  cholecalciferol (VITAMIN D) 1000 UNITS tablet   docusate  sodium (COLACE) 100 MG capsule   ferrous sulfate 325 (65 FE) MG EC tablet   finasteride (PROSCAR) 5 MG tablet   fluticasone (FLONASE) 50 MCG/ACT nasal spray   glucose blood (ONETOUCH ULTRA) test strip   losartan (COZAAR) 100 MG tablet   Melatonin 10 MG CAPS   meloxicam (MOBIC) 7.5 MG tablet   metFORMIN (GLUCOPHAGE-XR) 500 MG 24 hr tablet   Misc Natural Products (OSTEO BI-FLEX TRIPLE STRENGTH PO)   Multiple Vitamin (MULTIVITAMIN WITH MINERALS) TABS tablet   mupirocin ointment (BACTROBAN) 2 %   OneTouch Delica Lancets 16X MISC   pantoprazole (PROTONIX) 40 MG tablet   Probiotic Product (PROBIOTIC DAILY PO)   rosuvastatin (CRESTOR) 20 MG tablet   scopolamine (TRANSDERM SCOP, 1.5 MG,) 1 MG/3DAYS   sitaGLIPtin (JANUVIA) 100 MG tablet   tacrolimus (PROTOPIC) 0.1 % ointment   tamsulosin (FLOMAX) 0.4 MG CAPS capsule   History:   Past Medical History:  Diagnosis Date   A-fib (HCC)    a.) CHA2DS2VASc = 5 (age x2, HTN, vascular disease history, T2DM);  b.) s/p Watchman 08/21/2021; c.) rate/rhythm maintained on oral atenolol; chronically anticoagulated with reduced dose apixaban with plans to transition to DAPT (ASA + clopidogrel) x 4 months following THA on 10/21/2021   Allergy    Aortic atherosclerosis (HCC)    Basal cell carcinoma 11/28/2019   R neck post auricular, EDC  NODULAR AND INFILTRATIVE PATTERNS   Basal cell carcinoma 12/21/2019   R lateral calf, treated wiht EDC 01/26/2020   BCC (basal cell carcinoma) 06/19/2020   left distal calf, EDC 08/09/2020   Bilateral inguinal hernia    a.) s/p repair   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    Carpal tunnel syndrome on left    Cataract 11/2018   Chronic back pain    CKD stage 3 secondary to diabetes (HCC)    DDD (degenerative disc disease), lumbar    Diastolic dysfunction 09/60/4540   a.) TTE 11/19/2020: EF >55%, mild LVH, mod BAE, mild RVE, triv AR/PR, mod MR/TR, G1DD   Elevated PSA    GERD (gastroesophageal  reflux disease)    History of basal cell carcinoma (BCC) 08/21/2020   right chest pectoral tx'd with Moh's 08/21/2020   History of squamous cell carcinoma of skin 2021   left ear/Moh's   Hyperlipidemia    Hypertension    IDA (iron deficiency anemia)    Leg varices    Lumbar spinal stenosis    Nocturia    Organic impotence    Osteoarthritis    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   left proximal calf, EDC 08/09/2020   Squamous cell carcinoma of skin 11/28/2019   L ear sup helix  WELL DIFFERENTIATED   T2DM (type 2 diabetes mellitus) ([place])    Thrombophilia (HCC)    Type 2 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)    Umbilical hernia    a.) s/p repair   Past Surgical History:  Procedure Laterality Date   ANKLE ARTHROSCOPY WITH OPEN REDUCTION INTERNAL FIXATION (ORIF) Left 1998   COLONOSCOPY     COLONOSCOPY WITH PROPOFOL N/A 05/28/2021   Procedure: COLONOSCOPY WITH PROPOFOL;  Surgeon: Lucilla Lame, MD;  Location: [facility] ENDOSCOPY;  Service: Endoscopy;  Laterality: N/A;   ESOPHAGOGASTRODUODENOSCOPY (EGD) WITH PROPOFOL N/A 05/28/2021   Procedure: ESOPHAGOGASTRODUODENOSCOPY (EGD) WITH PROPOFOL;  Surgeon: Lucilla Lame, MD;  Location: [facility] ENDOSCOPY;  Service: Endoscopy;  Laterality: N/A;   ETHMOIDECTOMY Left 03/08/2020   Procedure: TOTAL ETHMOIDECTOMY;  Surgeon: Margaretha Sheffield, MD;  Location: [place];  Service: ENT;  Laterality: Left;   FRONTAL SINUS EXPLORATION Left 03/08/2020   Procedure: FRONTAL SINUS EXPLORATION;  Surgeon: Margaretha Sheffield, MD;  Location: [place];  Service: ENT;  Laterality: Left;   GIVENS CAPSULE STUDY N/A 07/11/2021   Procedure: GIVENS CAPSULE STUDY;  Surgeon: Lucilla Lame, MD;  Location: [facility] ENDOSCOPY;  Service: Endoscopy;  Laterality: N/A;   HEMORRHOID SURGERY     IMAGE GUIDED SINUS SURGERY N/A 03/08/2020   Procedure: IMAGE GUIDED SINUS SURGERY;  Surgeon:  Margaretha Sheffield, MD;  Location: [place];  Service: ENT;  Laterality: N/A;  placed disk on OR charge nurse desk 2-21 kp PLACED 2ND DISK ON OR CHARGE NURSE DESK 02-28-20 KP 3RD DISK ON OR CHARGE NURSE DESK 2-25- [place] Right 11/12/2018   Procedure: HERNIA REPAIR INGUINAL ADULT;  Surgeon: Fredirick Maudlin, MD;  Location: [facility] ORS;  Service: General;  Laterality: Right;   INGUINAL HERNIA REPAIR Left 2012   KNEE ARTHROPLASTY Right 04/16/2020   Procedure: COMPUTER ASSISTED TOTAL KNEE ARTHROPLASTY;  Surgeon: Dereck Leep, MD;  Location: [facility] ORS;  Service: Orthopedics;  Laterality: Right;   KNEE ARTHROSCOPY Right 2006   MAXILLARY ANTROSTOMY Left 03/08/2020   Procedure: MAXILLARY ANTROSTOMY WITH TISSUE REMOVAL;  Surgeon: Margaretha Sheffield, MD;  Location: [place];  Service: ENT;  Laterality: Left;   SEPTOPLASTY N/A 03/08/2020   Procedure: SEPTOPLASTY;  Surgeon: Margaretha Sheffield, MD;  Location: [place];  Service: ENT;  Laterality: N/A;   skin cancer resection Right 04/07/2018   ON hand.   THUMB ARTHROSCOPY Right 2013   tendon flap to joint   TONSILLECTOMY     UMBILICAL HERNIA REPAIR Left 2012   UPPER GI ENDOSCOPY     Family History  Problem Relation Age of Onset   Diabetes Mother    Coronary artery disease Father    Hypertension Father    Hyperlipidemia Father    Leukemia Father    Social History   Tobacco Use   Smoking status: Never   Smokeless tobacco: Never  Vaping Use   Vaping Use: Never used  Substance Use Topics   Alcohol use: No    Alcohol/week: 0.0 standard drinks of alcohol   Drug use: No    Pertinent Clinical Results:  LABS: Labs reviewed: Acceptable for surgery.  Component Date Value Ref Range Status   MRSA, PCR 10/09/2021 NEGATIVE  NEGATIVE Final   Staphylococcus aureus 10/09/2021 POSITIVE (A)  NEGATIVE Final   Comment: (NOTE) The Xpert SA Assay (FDA approved for NASAL specimens in patients 69 years of age and  older), is one component of a comprehensive surveillance program. It is not intended to diagnose infection nor to guide or monitor treatment. Performed at [facility], [place], [place] [address]    Color, Urine 10/09/2021 AMBER (A)  YELLOW Final   BIOCHEMICALS MAY BE AFFECTED BY COLOR   APPearance 10/09/2021 HAZY (A)  CLEAR Final   Specific Gravity, Urine 10/09/2021 1.029  1.005 - 1.030 Final   pH 10/09/2021 5.0  5.0 - 8.0 Final   Glucose, UA 10/09/2021 NEGATIVE  NEGATIVE mg/dL Final   Hgb urine dipstick 10/09/2021 NEGATIVE  NEGATIVE Final   Bilirubin Urine 10/09/2021 NEGATIVE  NEGATIVE Final   Ketones, ur 10/09/2021 NEGATIVE  NEGATIVE mg/dL Final  Protein, ur 10/09/2021 30 (A)  NEGATIVE mg/dL Final   Nitrite 10/09/2021 NEGATIVE  NEGATIVE Final   Leukocytes,Ua 10/09/2021 NEGATIVE  NEGATIVE Final   RBC / HPF 10/09/2021 0-5  0 - 5 RBC/hpf Final   WBC, UA 10/09/2021 0-5  0 - 5 WBC/hpf Final   Bacteria, UA 10/09/2021 NONE SEEN  NONE SEEN Final   Squamous Epithelial / LPF 10/09/2021 0-5  0 - 5 Final   Mucus 10/09/2021 PRESENT   Final   Hyaline Casts, UA 10/09/2021 PRESENT   Final   Performed at [facility], [place], [place] [address]   CRP 10/09/2021 0.9  <1.0 mg/dL Final   Performed at [facility], [place] [address] [place], [place] [address]   Sed Rate 10/09/2021 19  0 - 20 mm/hr Final   Performed at [facility], [address] [place], [place] [address]   ABO/RH(D) 10/09/2021 O POS   Final   Antibody Screen 10/09/2021 NEG   Final   Sample Expiration 10/09/2021 10/23/2021, 2359   Final   Extend sample reason 10/09/2021    Final                   Value:NO TRANSFUSIONS OR PREGNANCY IN THE PAST 3 MONTHS Performed at [facility], [address] [place], [place] [address]    Lab Results  Component Value Date   HGBA1C 6.5 (H) 10/07/2021    ECG: Date: 08/22/2021 Rate: 57 bpm Rhythm: sinus  bradycardia Axis (leads I and aVF): Normal Intervals: PR 192 ms. QRS 92 ms. QTc 459 ms. ST segment and T wave changes: Early precordial QRS transition Comparison: Similar to previous tracing obtained on 05/22/2021 NOTE: Tracing obtained at [facility]; unable for review. Above based on cardiologist's interpretation.    IMAGING / PROCEDURES: DIAGNOSTIC RADIOGRAPHS OF RIGHT HIP 2 OR 3 VIEWS WITH OR WITHOUT PELVIS performed on 09/24/2021 Complete loss of superior femoral acetabular joint space with bone-on-bone contact, subchondral sclerosis, and cyst formation noted. Possible subtle flattening of the femoral head noted when compared to x-rays from 04/30/2021.  DIAGNOSTIC RADIOGRAPHS OF CHEST 2 VIEWS performed on 08/22/2021 Interval placement of left atrial appendage occlusion device Mild LEFT basilar atelectasis Trace LEFT pleural effusion No pneumothorax Normal cardiomediastinal contours Calcified plaque in the aortic arch  TRANSESOPHAGEAL ECHOCARDIOGRAM performed on 10/07/2021 Limited study to evaluate Watchman device Watchman device well-seated in left atrial appendage without thrombus or leak No ASD by color Doppler Normal left ventricular function with an EF of >55%  MYOCARDIAL PERFUSION IMAGING STUDY (LEXISCAN) performed on 11/19/2020 Normal left ventricular systolic function with a normal LVEF of 61% Normal myocardial thickening and wall motion Left ventricular cavity size normal SPECT images demonstrate homogenous tracer distribution throughout the myocardium T wave inversions noted at peak stress Normal myocardial perfusion without evidence of myocardial ischemia  TRANSTHORACIC ECHOCARDIOGRAM performed on 11/19/2020 Normal left ventricular systolic function with mild LVH; LVEF >55% Left ventricular diastolic Doppler parameters consistent with abnormal relaxation (G1DD). Moderate biatrial enlargement Mild right ventricular enlargement Trivial atrial and pulmonic valve  regurgitation Moderate mitral and tricuspid valve regurgitation Normal gradients; no valvular stenosis No pericardial effusion  Impression and Plan:  Maxwell Stanley has been referred for pre-anesthesia review and clearance prior to him undergoing the planned anesthetic and procedural courses. Available labs, pertinent testing, and imaging results were personally reviewed by me. This patient has been appropriately cleared by cardiology with an overall LOW risk of significant perioperative cardiovascular complications.  Based on clinical review performed today (10/18/21), barring any significant acute changes in the patient's overall condition, it is anticipated that he will be able to proceed with the planned surgical intervention. Any acute changes in clinical condition may necessitate his procedure being postponed and/or cancelled. Patient will meet with anesthesia team (MD and/or CRNA) on the day of his procedure for preoperative evaluation/assessment. Questions regarding anesthetic course will be fielded at that time.   Pre-surgical instructions were reviewed with the patient during his PAT appointment and questions were fielded by PAT clinical staff. Patient was advised that if any questions or concerns arise prior to his procedure then he should return a call to PAT and/or his surgeon's office to discuss.  Honor Loh, MSN, APRN, FNP-C, CEN [facility]  Peri-operative Services Nurse Practitioner Phone: [phone] Fax: [phone] 10/18/21 8:52 AM  NOTE: This note has been prepared using Dragon dictation software. Despite my best ability to proofread, there is always the potential that unintentional transcriptional errors may still occur from this process.

## 2021-10-16 ENCOUNTER — Other Ambulatory Visit: Payer: Self-pay | Admitting: Internal Medicine

## 2021-10-16 DIAGNOSIS — I10 Essential (primary) hypertension: Secondary | ICD-10-CM

## 2021-10-16 NOTE — Telephone Encounter (Signed)
Requested Prescriptions  Pending Prescriptions Disp Refills  . amLODipine (NORVASC) 2.5 MG tablet [Pharmacy Med Name: AMLODIPINE TAB 2.'5MG'$ ] 90 tablet 1    Sig: TAKE 1 TABLET DAILY     Cardiovascular: Calcium Channel Blockers 2 Passed - 10/16/2021  8:55 AM      Passed - Last BP in normal range    BP Readings from Last 1 Encounters:  10/09/21 129/89         Passed - Last Heart Rate in normal range    Pulse Readings from Last 1 Encounters:  10/09/21 70         Passed - Valid encounter within last 6 months    Recent Outpatient Visits          1 week ago Type II diabetes mellitus with complication ([place])   [place] [facility] at [facility], Jesse Sans, MD   3 months ago Essential (primary) hypertension   [place] [facility] at [facility], Jesse Sans, MD   7 months ago Anemia, unspecified type   [facility] at [facility], Jesse Sans, MD   7 months ago Annual physical exam   [facility] at [facility], Jesse Sans, MD   1 year ago Essential (primary) hypertension   [place] [facility] at [facility], Jesse Sans, MD      Future Appointments            In 4 months [name], Jesse Sans, MD [facility] at [facility], [facility]           . finasteride (PROSCAR) 5 MG tablet [Pharmacy Med Name: FINASTERIDE  TAB '5MG'$ ] 90 tablet 1    Sig: TAKE 1 TABLET DAILY     Urology: 5-alpha Reductase Inhibitors Failed - 10/16/2021  8:55 AM      Failed - PSA in normal range and within 360 days    PSA  Date Value Ref Range Status  04/10/2017 1.83  Final   Prostate Specific Ag, Serum  Date Value Ref Range Status  02/15/2020 0.7 0.0 - 4.0 ng/mL Final    Comment:    Roche ECLIA methodology. According to the American Urological Association, Serum PSA should decrease and  remain at undetectable levels after radical prostatectomy. The AUA defines biochemical recurrence as an initial PSA value 0.2 ng/mL or greater followed by a subsequent confirmatory PSA value 0.2 ng/mL or greater. Values obtained with different assay methods or kits cannot be used interchangeably. Results cannot be interpreted as absolute evidence of the presence or absence of malignant disease.          Passed - Valid encounter within last 12 months    Recent Outpatient Visits          1 week ago Type II diabetes mellitus with complication [facility]   [place] [facility] at [facility], Jesse Sans, MD   3 months ago Essential (primary) hypertension   [place] [facility] at [facility], Jesse Sans, MD   7 months ago Anemia, unspecified type   [facility] at [facility], Jesse Sans, MD   7 months ago Annual physical exam   [facility] at [facility], Jesse Sans, MD   1 year  ago Essential (primary) hypertension   [place] [facility] at [facility], Jesse Sans, MD      Future Appointments            In 4 months [name], Jesse Sans, MD [facility] Primary Care and Sports Medicine at [facility], [facility]

## 2021-10-18 ENCOUNTER — Encounter: Payer: Self-pay | Admitting: Orthopedic Surgery

## 2021-10-20 MED ORDER — DEXAMETHASONE SODIUM PHOSPHATE 10 MG/ML IJ SOLN: 8.0000 mg | Freq: Once | INTRAMUSCULAR | Status: AC

## 2021-10-20 MED ORDER — SODIUM CHLORIDE 0.9 % IV SOLN: INTRAVENOUS | Status: DC

## 2021-10-20 MED ORDER — CHLORHEXIDINE GLUCONATE 4 % EX LIQD
60.0000 mL | Freq: Once | CUTANEOUS | Status: AC
  Administered 2021-10-20: 4 via TOPICAL

## 2021-10-20 MED ORDER — ORAL CARE MOUTH RINSE: 15.0000 mL | Freq: Once | OROMUCOSAL | Status: AC

## 2021-10-20 MED ORDER — CHLORHEXIDINE GLUCONATE 0.12 % MT SOLN: 15.0000 mL | Freq: Once | OROMUCOSAL | Status: AC

## 2021-10-20 MED ORDER — CEFAZOLIN SODIUM-DEXTROSE 2-4 GM/100ML-% IV SOLN
2.0000 g | INTRAVENOUS | Status: AC
  Administered 2021-10-21: 2 g via INTRAVENOUS

## 2021-10-20 MED ORDER — TRANEXAMIC ACID-NACL 1000-0.7 MG/100ML-% IV SOLN
1000.0000 mg | INTRAVENOUS | Status: AC
  Administered 2021-10-21: 1000 mg via INTRAVENOUS

## 2021-10-20 NOTE — H&P (Signed)
ORTHOPAEDIC HISTORY & PHYSICAL Gwenlyn Fudge, [place] - 10/11/2021 10:45 AM EDT Formatting of this note is different from the original. [place] Chief Complaint:   Chief Complaint  Patient presents with  Hip Pain  H & P RIGHT HIP   History of Present Illness:   Maxwell Stanley is a 87 y.o. male that presents to clinic today for his preoperative history and evaluation. Patient presents with his wife. The patient is scheduled to undergo a right total hip arthroplasty on 10/21/21 by Dr. Marry Guan. His pain began several years ago. The pain is located primarily in the right hip and groin. He describes his pain as worse with weightbearing. He denies associated numbness or tingling, denies radicular symptoms.   The patient's symptoms have progressed to the point that they decrease his quality of life. The patient has previously undergone conservative treatment including NSAIDS and activity modification without adequate control of his symptoms.  Denies history of lumbar surgery, DVT. He has received cardiac clearance. His wife will be available to help post-operatively.  Patient is diabetic. Last A1c 6.5 10/07/21.  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  Hyperlipidemia  Hypertension  Motion sickness  Osteoarthritis  Paroxysmal A-fib (CMS-HCC)  Persistent atrial fibrillation (CMS-HCC)  Type 2 diabetes mellitus with diabetic nephropathy,  without long-term current use of insulin (CMS-HCC)   Past Surgical History:  Past Surgical History:  Procedure Laterality Date  Left ankle surgery 1999  Hardware in place.  Right knee surgery 2006  Squamous cell carcinoma removed 2012  Squamous cell carcinoma removed from right ear and head.  HERNIA REPAIR 04/01/2010  Right thumb [facility] surgery 02/03/2011  Tendon interpositional arthroplasty [facility] joint right thumb.  FUNCTIONAL ENDOSCOPIC SINUS SURGERY 03/08/2020  Right total knee arthroplasty using computer-assisted navigation 04/16/2020  Dr Marry Guan  Hemorrhoidectomy  Reattachment of detached retina  TONSILLECTOMY   Current Medications:  Current Outpatient Medications  Medication Sig Dispense Refill  acetaminophen (TYLENOL) 500 MG tablet Take 1,000 mg by mouth 2 (two) times daily  acetaminophen (TYLENOL) 650 MG ER tablet Take 650 mg by mouth every 8 (eight) hours as needed for Pain.  amLODIPine (NORVASC) 2.5 MG tablet TAKE 1 TABLET BY MOUTH EVERY DAY  apixaban (ELIQUIS) 2.5 mg tablet Take 1 tablet (2.5 mg total) by mouth every 12 (twelve) hours 60 tablet 11  aspirin 81 MG EC tablet Take 1 tablet (81 mg total) by mouth once daily 30 tablet 3  atenolol (TENORMIN) 50 MG tablet Take 50 mg by mouth once daily.  blood glucose diagnostic (ONETOUCH ULTRA TEST) test strip once daily  cholecalciferol (VITAMIN D3) 1000 unit tablet Take 1,000 Units by mouth once daily  clopidogreL (PLAVIX) 75 mg tablet Take 1 tablet (75 mg total) by mouth once daily 30 tablet 3  docusate (COLACE) 100 MG capsule Take 100 mg by mouth 3 (three) times daily as needed for Constipation.  ferrous sulfate 325 (65 FE) MG EC tablet Take 325 mg by mouth once daily  finasteride (PROSCAR) 5 mg tablet Take 1 tablet (  5 mg total) by mouth once daily  fluticasone propionate (FLONASE) 50 mcg/actuation nasal spray Place 1 spray into both nostrils as needed  glucosam/chon-msm1/C/mang/bosw (OSTEO BI-FLEX TRIPLE STRENGTH ORAL) Take 2  tablets by mouth every morning  Lactobac comb 3-FOS-pantethine 300-250 million cell-mg Cap Take 1 capsule by mouth once daily  losartan (COZAAR) 100 MG tablet Take 50 mg by mouth 2 (two) times daily  melatonin 10 mg Cap Take 1 capsule by mouth nightly as needed  metFORMIN (GLUCOPHAGE) 500 MG tablet Take by mouth 1,500 total (patient takes 500 mg in the morning and 1,000 mg nightly)  multivitamin tablet Take 1 tablet by mouth once daily.  mupirocin (BACTROBAN) 2 % ointment 1 application 2 (two) times daily.  pantoprazole (PROTONIX) 40 MG DR tablet Take 40 mg by mouth once daily.  rosuvastatin (CRESTOR) 20 MG tablet Take 20 mg by mouth once daily.  scopolamine (TRANSDERM-SCOP) 1 mg over 3 days patch Place 1 patch onto the skin every third day  SITagliptin (JANUVIA) 50 MG tablet Take 50 mg by mouth once daily  tamsulosin (FLOMAX) 0.4 mg capsule Take 0.4 mg by mouth once daily Take 30 minutes after same meal each day.  meloxicam (MOBIC) 7.5 MG tablet TAKE 1 TABLET BY MOUTH 2 TIMES DAILY. 60 tablet 1   No current facility-administered medications for this visit.   Allergies:  Allergies  Allergen Reactions  Codeine Hallucination  Altered mental status  Penicillin G Rash  Did it involve swelling of the face/tongue/throat, SOB, or low BP? No Did it involve sudden or severe rash/hives, skin peeling, or any reaction on the inside of your mouth or nose? No Did you need to seek medical attention at a hospital or doctor's office? No When did it last happen?  If all above answers are "NO", may proceed with cephalosporin use.  Rocephin [Ceftriaxone] Rash   Social History:  Social History   Socioeconomic History  Marital status: Married  Spouse name: Vaughan Basta  Number of children: 2  Years of education: 103  Occupational History  Occupation: Retired- Press photographer  Tobacco Use  Smoking status: Never  Smokeless tobacco: Never  Surveyor, mining Use: Never used  Substance and Sexual Activity  Alcohol  use: Never  Alcohol/week: 0.0 standard drinks of alcohol  Drug use: Never  Sexual activity: Yes  Partners: Female  Birth control/protection: None   Social Determinants of Health   Transportation Needs: No Transportation Needs (08/21/2021)  PRAPARE - Transport planner (Medical): No  Lack of Transportation (Non-Medical): No   Family History:  Family History  Problem Relation Age of Onset  Osteoporosis (Thinning of bones) Mother  Diabetes type II Mother  Gallbladder disease Mother  Myocardial Infarction (Heart attack) Father  Anesthesia problems Neg Hx  Malignant hyperthermia Neg Hx   Review of Systems:   A 10+ ROS was performed, reviewed, and the pertinent orthopaedic findings are documented in the HPI.   Physical Examination:   BP (!) 140/80 (BP Location: Left upper arm, Patient Position: Sitting, BP Cuff Size: Adult)  Ht 188 cm ('6\' 2"'$ )  Wt 77.8 kg (171 lb 9.6 oz)  BMI 22.03 kg/m   Patient is a well-developed, well-nourished male in no acute distress. Patient has normal mood and affect. Patient is alert and oriented to person, place, and time.   HEENT: Atraumatic, normocephalic. Pupils equal and reactive to light. Extraocular motion intact. Noninjected sclera.  Cardiovascular: Regular rate and rhythm, with no murmurs, rubs, or gallops. Distal pulses palpable.  Respiratory: Lungs clear to auscultation bilaterally.   Right Hip: Pelvic tilt: Negative Limb lengths: Equal with the patient standing Soft tissue swelling: Negative Erythema: Negative Crepitance: Negative Tenderness: Greater trochanter is nontender to palpation. Moderate pain is elicited by axial compression or extremes of rotation. Atrophy: No atrophy. Good hip flexor and abductor strength. Range of Motion: EXT/FLEX: 0/0/110 ADD/ABD: 20/0/20 IR/ER: 0/0/20   Able to plantarflex and dorsiflex right ankle. Able to flex and extend the toes.  Sensation is intact over the saphenous,  lateral cutaneous, superficial fibular, and deep fibular nerve distributions.  Tests Performed/Reviewed:  X-rays  No new radiographs were obtained today. Previous radiographs were reviewed of the right hip and revealed complete loss of superior femoral acetabular joint space with subchondral sclerosis and osteophyte formation. Osteophyte formation noted.  Impression:   ICD-10-CM  1. Primary osteoarthritis of right hip M16.11   Plan:   The patient has end-stage degenerative changes of the right hip. It was explained to the patient that the condition is progressive in nature. Having failed conservative treatment, the patient has elected to proceed with a total joint arthroplasty. The patient will undergo a total joint arthroplasty with Dr. Marry Guan. The risks of surgery, including blood clot and infection, were discussed with the patient. Measures to reduce these risks, including the use of anticoagulation, perioperative antibiotics, and early ambulation were discussed. The importance of postoperative physical therapy was discussed with the patient. The patient elects to proceed with surgery. The patient is instructed to stop all blood thinners prior to surgery. The patient is instructed to call the hospital the day before surgery to learn of the proper arrival time.   Contact our office with any questions or concerns. Follow up as indicated, or sooner should any new problems arise, if conditions worsen, or if they are otherwise concerned.   Gwenlyn Fudge, PA-C [facility] [place], [place] [address] Phone: [phone]  This note was generated in part with voice recognition software and I apologize for any typographical errors that were not detected and corrected.  Electronically signed by Gwenlyn Fudge, PA at 10/18/2021 5:08 PM EDT

## 2021-10-21 ENCOUNTER — Observation Stay
Admission: RE | Admit: 2021-10-21 | Discharge: 2021-10-22 | Disposition: A | Discharge status: Home / Self Care | Payer: Medicare HMO | Attending: Orthopedic Surgery | Admitting: Orthopedic Surgery

## 2021-10-21 ENCOUNTER — Inpatient Hospital Stay: Payer: Medicare HMO | Admitting: Urgent Care

## 2021-10-21 ENCOUNTER — Encounter
Admission: RE | Disposition: A | Discharge status: Home / Self Care | Payer: Self-pay | Source: Home / Self Care | Attending: Orthopedic Surgery

## 2021-10-21 ENCOUNTER — Observation Stay: Payer: Medicare HMO

## 2021-10-21 ENCOUNTER — Encounter: Payer: Self-pay | Admitting: Orthopedic Surgery

## 2021-10-21 ENCOUNTER — Other Ambulatory Visit: Payer: Self-pay

## 2021-10-21 DIAGNOSIS — Z79899 Other long term (current) drug therapy: Secondary | ICD-10-CM | POA: Insufficient documentation

## 2021-10-21 DIAGNOSIS — M1712 Unilateral primary osteoarthritis, left knee: Secondary | ICD-10-CM

## 2021-10-21 DIAGNOSIS — I4819 Other persistent atrial fibrillation: Secondary | ICD-10-CM | POA: Diagnosis not present

## 2021-10-21 DIAGNOSIS — Z7902 Long term (current) use of antithrombotics/antiplatelets: Secondary | ICD-10-CM | POA: Insufficient documentation

## 2021-10-21 DIAGNOSIS — Z85828 Personal history of other malignant neoplasm of skin: Secondary | ICD-10-CM | POA: Diagnosis not present

## 2021-10-21 DIAGNOSIS — I129 Hypertensive chronic kidney disease with stage 1 through stage 4 chronic kidney disease, or unspecified chronic kidney disease: Secondary | ICD-10-CM | POA: Diagnosis not present

## 2021-10-21 DIAGNOSIS — E119 Type 2 diabetes mellitus without complications: Secondary | ICD-10-CM | POA: Diagnosis not present

## 2021-10-21 DIAGNOSIS — Z7984 Long term (current) use of oral hypoglycemic drugs: Secondary | ICD-10-CM | POA: Diagnosis not present

## 2021-10-21 DIAGNOSIS — Z01818 Encounter for other preprocedural examination: Secondary | ICD-10-CM

## 2021-10-21 DIAGNOSIS — Z96641 Presence of right artificial hip joint: Secondary | ICD-10-CM

## 2021-10-21 DIAGNOSIS — D509 Iron deficiency anemia, unspecified: Secondary | ICD-10-CM

## 2021-10-21 DIAGNOSIS — I10 Essential (primary) hypertension: Secondary | ICD-10-CM | POA: Diagnosis not present

## 2021-10-21 DIAGNOSIS — Z7982 Long term (current) use of aspirin: Secondary | ICD-10-CM | POA: Diagnosis not present

## 2021-10-21 DIAGNOSIS — Z7901 Long term (current) use of anticoagulants: Secondary | ICD-10-CM | POA: Diagnosis not present

## 2021-10-21 DIAGNOSIS — Z471 Aftercare following joint replacement surgery: Secondary | ICD-10-CM | POA: Diagnosis not present

## 2021-10-21 DIAGNOSIS — M1611 Unilateral primary osteoarthritis, right hip: Secondary | ICD-10-CM | POA: Diagnosis not present

## 2021-10-21 DIAGNOSIS — E1122 Type 2 diabetes mellitus with diabetic chronic kidney disease: Secondary | ICD-10-CM | POA: Diagnosis not present

## 2021-10-21 DIAGNOSIS — Z9889 Other specified postprocedural states: Secondary | ICD-10-CM | POA: Diagnosis not present

## 2021-10-21 DIAGNOSIS — E118 Type 2 diabetes mellitus with unspecified complications: Secondary | ICD-10-CM

## 2021-10-21 DIAGNOSIS — N183 Chronic kidney disease, stage 3 unspecified: Secondary | ICD-10-CM

## 2021-10-21 HISTORY — DX: Other primary thrombophilia: D68.59

## 2021-10-21 HISTORY — DX: Bilateral inguinal hernia, without obstruction or gangrene, not specified as recurrent: K40.20

## 2021-10-21 HISTORY — DX: Umbilical hernia without obstruction or gangrene: K42.9

## 2021-10-21 HISTORY — DX: Atherosclerosis of aorta: I70.0

## 2021-10-21 HISTORY — DX: Iron deficiency anemia, unspecified: D50.9

## 2021-10-21 HISTORY — DX: Other chronic pain: G89.29

## 2021-10-21 HISTORY — DX: Benign prostatic hyperplasia with lower urinary tract symptoms: N13.8

## 2021-10-21 HISTORY — DX: Elevated prostate specific antigen (PSA): R97.20

## 2021-10-21 HISTORY — DX: Spinal stenosis, lumbar region without neurogenic claudication: M48.061

## 2021-10-21 HISTORY — DX: Asymptomatic varicose veins of unspecified lower extremity: I83.90

## 2021-10-21 HISTORY — DX: Other obstructive and reflux uropathy: N40.1

## 2021-10-21 HISTORY — DX: Unspecified osteoarthritis, unspecified site: M19.90

## 2021-10-21 HISTORY — PX: TOTAL HIP ARTHROPLASTY: SHX124

## 2021-10-21 HISTORY — DX: Type 2 diabetes mellitus without complications: E11.9

## 2021-10-21 LAB — GLUCOSE, CAPILLARY
Glucose-Capillary: 149 mg/dL — ABNORMAL HIGH (ref 70–99)
Glucose-Capillary: 200 mg/dL — ABNORMAL HIGH (ref 70–99)
Glucose-Capillary: 279 mg/dL — ABNORMAL HIGH (ref 70–99)
Glucose-Capillary: 319 mg/dL — ABNORMAL HIGH (ref 70–99)

## 2021-10-21 SURGERY — ARTHROPLASTY, HIP, TOTAL,POSTERIOR APPROACH
Anesthesia: Spinal | Site: Hip | Laterality: Right

## 2021-10-21 MED ORDER — FERROUS SULFATE 325 (65 FE) MG PO TABS
ORAL_TABLET | ORAL | Status: AC
  Administered 2021-10-21: 325 mg via ORAL
  Filled 2021-10-21: qty 1

## 2021-10-21 MED ORDER — FENTANYL CITRATE (PF) 100 MCG/2ML IJ SOLN
25.0000 ug | INTRAMUSCULAR | Status: DC | PRN
  Administered 2021-10-21: 25 ug via INTRAVENOUS

## 2021-10-21 MED ORDER — LOSARTAN POTASSIUM 50 MG PO TABS
50.0000 mg | ORAL_TABLET | Freq: Two times a day (BID) | ORAL | Status: DC
  Administered 2021-10-21 – 2021-10-22 (×3): 50 mg via ORAL
  Filled 2021-10-21 (×3): qty 1

## 2021-10-21 MED ORDER — ACETAMINOPHEN 10 MG/ML IV SOLN
1000.0000 mg | Freq: Four times a day (QID) | INTRAVENOUS | Status: DC
  Administered 2021-10-21: 1000 mg via INTRAVENOUS

## 2021-10-21 MED ORDER — INSULIN ASPART 100 UNIT/ML IJ SOLN
0.0000 [IU] | Freq: Three times a day (TID) | INTRAMUSCULAR | Status: DC
  Administered 2021-10-21: 8 [IU] via SUBCUTANEOUS

## 2021-10-21 MED ORDER — SENNOSIDES-DOCUSATE SODIUM 8.6-50 MG PO TABS
1.0000 | ORAL_TABLET | Freq: Two times a day (BID) | ORAL | Status: DC
  Administered 2021-10-21: 1 via ORAL

## 2021-10-21 MED ORDER — FLUTICASONE PROPIONATE 50 MCG/ACT NA SUSP: 2.0000 | NASAL | Status: DC | PRN

## 2021-10-21 MED ORDER — MAGNESIUM HYDROXIDE 400 MG/5ML PO SUSP
ORAL | Status: AC
  Administered 2021-10-21: 30 mL via ORAL
  Filled 2021-10-21: qty 30

## 2021-10-21 MED ORDER — CEFAZOLIN SODIUM-DEXTROSE 2-4 GM/100ML-% IV SOLN
INTRAVENOUS | Status: AC
  Filled 2021-10-21: qty 100

## 2021-10-21 MED ORDER — TRAMADOL HCL 50 MG PO TABS
ORAL_TABLET | ORAL | Status: AC
  Filled 2021-10-21: qty 1

## 2021-10-21 MED ORDER — SENNOSIDES-DOCUSATE SODIUM 8.6-50 MG PO TABS
ORAL_TABLET | ORAL | Status: AC
  Administered 2021-10-21: 1 via ORAL
  Filled 2021-10-21: qty 1

## 2021-10-21 MED ORDER — BUPIVACAINE HCL (PF) 0.5 % IJ SOLN
INTRAMUSCULAR | Status: AC
  Filled 2021-10-21: qty 10

## 2021-10-21 MED ORDER — SENNOSIDES-DOCUSATE SODIUM 8.6-50 MG PO TABS
ORAL_TABLET | ORAL | Status: AC
  Filled 2021-10-21: qty 1

## 2021-10-21 MED ORDER — CHLORHEXIDINE GLUCONATE 0.12 % MT SOLN
OROMUCOSAL | Status: AC
  Administered 2021-10-21: 15 mL via OROMUCOSAL
  Filled 2021-10-21: qty 15

## 2021-10-21 MED ORDER — ACETAMINOPHEN 10 MG/ML IV SOLN
INTRAVENOUS | Status: DC | PRN
  Administered 2021-10-21: 1000 mg via INTRAVENOUS

## 2021-10-21 MED ORDER — CELECOXIB 200 MG PO CAPS
200.0000 mg | ORAL_CAPSULE | Freq: Two times a day (BID) | ORAL | Status: DC
  Administered 2021-10-21: 200 mg via ORAL

## 2021-10-21 MED ORDER — LINAGLIPTIN 5 MG PO TABS
5.0000 mg | ORAL_TABLET | Freq: Every day | ORAL | Status: DC
  Administered 2021-10-22: 5 mg via ORAL
  Filled 2021-10-21: qty 1

## 2021-10-21 MED ORDER — MENTHOL 3 MG MT LOZG: 1.0000 | LOZENGE | OROMUCOSAL | Status: DC | PRN

## 2021-10-21 MED ORDER — PANTOPRAZOLE SODIUM 40 MG PO TBEC
DELAYED_RELEASE_TABLET | ORAL | Status: AC
  Filled 2021-10-21: qty 1

## 2021-10-21 MED ORDER — PROPOFOL 1000 MG/100ML IV EMUL
INTRAVENOUS | Status: AC
  Filled 2021-10-21: qty 100

## 2021-10-21 MED ORDER — APIXABAN 2.5 MG PO TABS
2.5000 mg | ORAL_TABLET | Freq: Two times a day (BID) | ORAL | Status: DC
  Administered 2021-10-22: 2.5 mg via ORAL
  Filled 2021-10-21: qty 1

## 2021-10-21 MED ORDER — 0.9 % SODIUM CHLORIDE (POUR BTL) OPTIME
TOPICAL | Status: DC | PRN
  Administered 2021-10-21: 1000 mL

## 2021-10-21 MED ORDER — FINASTERIDE 5 MG PO TABS
5.0000 mg | ORAL_TABLET | Freq: Every day | ORAL | Status: DC
  Administered 2021-10-22: 5 mg via ORAL
  Filled 2021-10-21: qty 1

## 2021-10-21 MED ORDER — CELECOXIB 200 MG PO CAPS
ORAL_CAPSULE | ORAL | Status: AC
  Administered 2021-10-21: 200 mg via ORAL
  Filled 2021-10-21: qty 1

## 2021-10-21 MED ORDER — EPHEDRINE SULFATE (PRESSORS) 50 MG/ML IJ SOLN
INTRAMUSCULAR | Status: DC | PRN
  Administered 2021-10-21: 15 mg via INTRAVENOUS
  Administered 2021-10-21: 10 mg via INTRAVENOUS

## 2021-10-21 MED ORDER — INSULIN ASPART 100 UNIT/ML IJ SOLN
INTRAMUSCULAR | Status: AC
  Filled 2021-10-21: qty 1

## 2021-10-21 MED ORDER — PROPOFOL 500 MG/50ML IV EMUL
INTRAVENOUS | Status: DC | PRN
  Administered 2021-10-21: 60 ug/kg/min via INTRAVENOUS

## 2021-10-21 MED ORDER — DEXAMETHASONE SODIUM PHOSPHATE 10 MG/ML IJ SOLN
INTRAMUSCULAR | Status: AC
  Administered 2021-10-21: 8 mg via INTRAVENOUS
  Filled 2021-10-21: qty 1

## 2021-10-21 MED ORDER — VITAMIN D 25 MCG (1000 UNIT) PO TABS
1000.0000 [IU] | ORAL_TABLET | Freq: Every day | ORAL | Status: DC
  Administered 2021-10-22: 1000 [IU] via ORAL
  Filled 2021-10-21: qty 1

## 2021-10-21 MED ORDER — GABAPENTIN 300 MG PO CAPS: 300.0000 mg | ORAL_CAPSULE | Freq: Once | ORAL | Status: AC

## 2021-10-21 MED ORDER — PHENYLEPHRINE HCL-NACL 20-0.9 MG/250ML-% IV SOLN
INTRAVENOUS | Status: AC
  Filled 2021-10-21: qty 250

## 2021-10-21 MED ORDER — BISACODYL 10 MG RE SUPP: 10.0000 mg | Freq: Every day | RECTAL | Status: DC | PRN

## 2021-10-21 MED ORDER — PANTOPRAZOLE SODIUM 40 MG PO TBEC
40.0000 mg | DELAYED_RELEASE_TABLET | Freq: Two times a day (BID) | ORAL | Status: DC
  Administered 2021-10-21: 40 mg via ORAL

## 2021-10-21 MED ORDER — FLEET ENEMA 7-19 GM/118ML RE ENEM: 1.0000 | ENEMA | Freq: Once | RECTAL | Status: DC | PRN

## 2021-10-21 MED ORDER — PROPOFOL 10 MG/ML IV BOLUS
INTRAVENOUS | Status: DC | PRN
  Administered 2021-10-21 (×2): 30 mg via INTRAVENOUS
  Administered 2021-10-21 (×2): 20 mg via INTRAVENOUS

## 2021-10-21 MED ORDER — NEOMYCIN-POLYMYXIN B GU 40-200000 IR SOLN
Status: AC
  Filled 2021-10-21: qty 20

## 2021-10-21 MED ORDER — PHENOL 1.4 % MT LIQD: 1.0000 | OROMUCOSAL | Status: DC | PRN

## 2021-10-21 MED ORDER — EPHEDRINE 5 MG/ML INJ
INTRAVENOUS | Status: AC
  Filled 2021-10-21: qty 5

## 2021-10-21 MED ORDER — OXYCODONE HCL 5 MG PO TABS: 5.0000 mg | ORAL_TABLET | ORAL | Status: DC | PRN

## 2021-10-21 MED ORDER — ONDANSETRON HCL 4 MG/2ML IJ SOLN: 4.0000 mg | Freq: Four times a day (QID) | INTRAMUSCULAR | Status: DC | PRN

## 2021-10-21 MED ORDER — RISAQUAD PO CAPS
1.0000 | ORAL_CAPSULE | Freq: Every day | ORAL | Status: DC
  Administered 2021-10-21 – 2021-10-22 (×2): 1 via ORAL
  Filled 2021-10-21 (×2): qty 1

## 2021-10-21 MED ORDER — TRANEXAMIC ACID-NACL 1000-0.7 MG/100ML-% IV SOLN: 1000.0000 mg | Freq: Once | INTRAVENOUS | Status: AC

## 2021-10-21 MED ORDER — ACETAMINOPHEN 325 MG PO TABS: 325.0000 mg | ORAL_TABLET | Freq: Four times a day (QID) | ORAL | Status: DC | PRN

## 2021-10-21 MED ORDER — BUPIVACAINE HCL (PF) 0.5 % IJ SOLN
INTRAMUSCULAR | Status: DC | PRN
  Administered 2021-10-21: 3 mL

## 2021-10-21 MED ORDER — METOCLOPRAMIDE HCL 10 MG PO TABS
ORAL_TABLET | ORAL | Status: AC
  Administered 2021-10-21: 10 mg via ORAL
  Filled 2021-10-21: qty 1

## 2021-10-21 MED ORDER — AMLODIPINE BESYLATE 5 MG PO TABS
2.5000 mg | ORAL_TABLET | Freq: Every day | ORAL | Status: DC
  Administered 2021-10-22: 2.5 mg via ORAL
  Filled 2021-10-21: qty 0.5

## 2021-10-21 MED ORDER — METFORMIN HCL ER 500 MG PO TB24
500.0000 mg | ORAL_TABLET | Freq: Two times a day (BID) | ORAL | Status: DC
  Administered 2021-10-21 – 2021-10-22 (×2): 500 mg via ORAL
  Filled 2021-10-21 (×2): qty 1

## 2021-10-21 MED ORDER — HYDROMORPHONE HCL 1 MG/ML IJ SOLN: 0.5000 mg | INTRAMUSCULAR | Status: DC | PRN

## 2021-10-21 MED ORDER — ACETAMINOPHEN 10 MG/ML IV SOLN
INTRAVENOUS | Status: AC
  Filled 2021-10-21: qty 100

## 2021-10-21 MED ORDER — TAMSULOSIN HCL 0.4 MG PO CAPS
0.4000 mg | ORAL_CAPSULE | Freq: Every day | ORAL | Status: DC
  Administered 2021-10-22: 0.4 mg via ORAL
  Filled 2021-10-21: qty 1

## 2021-10-21 MED ORDER — ACETAMINOPHEN 10 MG/ML IV SOLN
INTRAVENOUS | Status: AC
  Administered 2021-10-21: 1000 mg via INTRAVENOUS
  Filled 2021-10-21: qty 100

## 2021-10-21 MED ORDER — TRAMADOL HCL 50 MG PO TABS
50.0000 mg | ORAL_TABLET | ORAL | Status: DC | PRN
  Administered 2021-10-21: 50 mg via ORAL
  Administered 2021-10-22: 100 mg via ORAL

## 2021-10-21 MED ORDER — PHENYLEPHRINE HCL-NACL 20-0.9 MG/250ML-% IV SOLN
INTRAVENOUS | Status: DC | PRN
  Administered 2021-10-21: 30 ug/min via INTRAVENOUS

## 2021-10-21 MED ORDER — ADULT MULTIVITAMIN W/MINERALS CH
1.0000 | ORAL_TABLET | Freq: Every day | ORAL | Status: DC
  Administered 2021-10-21 – 2021-10-22 (×2): 1 via ORAL
  Filled 2021-10-21 (×3): qty 1

## 2021-10-21 MED ORDER — INSULIN ASPART 100 UNIT/ML IJ SOLN
0.0000 [IU] | Freq: Every day | INTRAMUSCULAR | Status: DC
  Administered 2021-10-21: 4 [IU] via SUBCUTANEOUS

## 2021-10-21 MED ORDER — CELECOXIB 200 MG PO CAPS
ORAL_CAPSULE | ORAL | Status: AC
  Filled 2021-10-21: qty 1

## 2021-10-21 MED ORDER — ONDANSETRON HCL 4 MG/2ML IJ SOLN: 4.0000 mg | Freq: Once | INTRAMUSCULAR | Status: DC | PRN

## 2021-10-21 MED ORDER — SURGIRINSE WOUND IRRIGATION SYSTEM - OPTIME
TOPICAL | Status: DC | PRN
  Administered 2021-10-21: 450 mL via TOPICAL

## 2021-10-21 MED ORDER — METOCLOPRAMIDE HCL 10 MG PO TABS
10.0000 mg | ORAL_TABLET | Freq: Three times a day (TID) | ORAL | Status: DC
  Administered 2021-10-21 – 2021-10-22 (×2): 10 mg via ORAL

## 2021-10-21 MED ORDER — TACROLIMUS 0.1 % EX OINT: TOPICAL_OINTMENT | Freq: Two times a day (BID) | CUTANEOUS | Status: DC

## 2021-10-21 MED ORDER — METOCLOPRAMIDE HCL 10 MG PO TABS
ORAL_TABLET | ORAL | Status: AC
  Filled 2021-10-21: qty 1

## 2021-10-21 MED ORDER — SODIUM CHLORIDE 0.9 % IR SOLN
Status: DC | PRN
  Administered 2021-10-21: 3000 mL

## 2021-10-21 MED ORDER — ATENOLOL 50 MG PO TABS
50.0000 mg | ORAL_TABLET | Freq: Every day | ORAL | Status: DC
  Administered 2021-10-21: 50 mg via ORAL
  Filled 2021-10-21: qty 1

## 2021-10-21 MED ORDER — CEFAZOLIN SODIUM-DEXTROSE 2-4 GM/100ML-% IV SOLN
INTRAVENOUS | Status: AC
  Administered 2021-10-21: 2 g via INTRAVENOUS
  Filled 2021-10-21: qty 100

## 2021-10-21 MED ORDER — ROSUVASTATIN CALCIUM 20 MG PO TABS
20.0000 mg | ORAL_TABLET | Freq: Every day | ORAL | Status: DC
  Administered 2021-10-21: 20 mg via ORAL
  Filled 2021-10-21: qty 1

## 2021-10-21 MED ORDER — TRANEXAMIC ACID-NACL 1000-0.7 MG/100ML-% IV SOLN
INTRAVENOUS | Status: AC
  Administered 2021-10-21: 1000 mg via INTRAVENOUS
  Filled 2021-10-21: qty 100

## 2021-10-21 MED ORDER — FENTANYL CITRATE (PF) 100 MCG/2ML IJ SOLN
INTRAMUSCULAR | Status: AC
  Administered 2021-10-21: 25 ug via INTRAVENOUS
  Filled 2021-10-21: qty 2

## 2021-10-21 MED ORDER — OXYCODONE HCL 5 MG PO TABS: 10.0000 mg | ORAL_TABLET | ORAL | Status: DC | PRN

## 2021-10-21 MED ORDER — TRANEXAMIC ACID-NACL 1000-0.7 MG/100ML-% IV SOLN
INTRAVENOUS | Status: AC
  Filled 2021-10-21: qty 100

## 2021-10-21 MED ORDER — DIPHENHYDRAMINE HCL 12.5 MG/5ML PO ELIX: 12.5000 mg | ORAL_SOLUTION | ORAL | Status: DC | PRN

## 2021-10-21 MED ORDER — ALUM & MAG HYDROXIDE-SIMETH 200-200-20 MG/5ML PO SUSP: 30.0000 mL | ORAL | Status: DC | PRN

## 2021-10-21 MED ORDER — PHENYLEPHRINE 80 MCG/ML (10ML) SYRINGE FOR IV PUSH (FOR BLOOD PRESSURE SUPPORT)
PREFILLED_SYRINGE | INTRAVENOUS | Status: AC
  Filled 2021-10-21: qty 10

## 2021-10-21 MED ORDER — MAGNESIUM HYDROXIDE 400 MG/5ML PO SUSP: 30.0000 mL | Freq: Every day | ORAL | Status: DC

## 2021-10-21 MED ORDER — CEFAZOLIN SODIUM-DEXTROSE 2-4 GM/100ML-% IV SOLN
2.0000 g | Freq: Four times a day (QID) | INTRAVENOUS | Status: AC
  Administered 2021-10-21: 2 g via INTRAVENOUS

## 2021-10-21 MED ORDER — SODIUM CHLORIDE 0.9 % IV SOLN: INTRAVENOUS | Status: DC

## 2021-10-21 MED ORDER — FERROUS SULFATE 325 (65 FE) MG PO TABS: 325.0000 mg | ORAL_TABLET | Freq: Every day | ORAL | Status: DC

## 2021-10-21 MED ORDER — PANTOPRAZOLE SODIUM 40 MG PO TBEC
DELAYED_RELEASE_TABLET | ORAL | Status: AC
  Administered 2021-10-21: 40 mg via ORAL
  Filled 2021-10-21: qty 1

## 2021-10-21 MED ORDER — ONDANSETRON HCL 4 MG PO TABS: 4.0000 mg | ORAL_TABLET | Freq: Four times a day (QID) | ORAL | Status: DC | PRN

## 2021-10-21 MED ORDER — GABAPENTIN 300 MG PO CAPS
ORAL_CAPSULE | ORAL | Status: AC
  Administered 2021-10-21: 300 mg via ORAL
  Filled 2021-10-21: qty 1

## 2021-10-21 SURGICAL SUPPLY — 60 items
BLADE SAW 90X25X1.19 OSCILLAT (BLADE) ×1 IMPLANT
CATH COUDE FOLEY 2W 5CC 16FR (CATHETERS) IMPLANT
CUP ACETAB SECTOR 64 HIP (Hips) IMPLANT
DRAPE 3/4 80X56 (DRAPES) ×1 IMPLANT
DRAPE INCISE IOBAN 66X60 STRL (DRAPES) ×1 IMPLANT
DRSG DERMACEA NONADH 3X8 (GAUZE/BANDAGES/DRESSINGS) ×1 IMPLANT
DRSG MEPILEX SACRM 8.7X9.8 (GAUZE/BANDAGES/DRESSINGS) ×1 IMPLANT
DRSG OPSITE POSTOP 4X12 (GAUZE/BANDAGES/DRESSINGS) ×1 IMPLANT
DRSG OPSITE POSTOP 4X14 (GAUZE/BANDAGES/DRESSINGS) IMPLANT
DRSG TEGADERM 4X4.75 (GAUZE/BANDAGES/DRESSINGS) ×1 IMPLANT
DURAPREP 26ML APPLICATOR (WOUND CARE) ×2 IMPLANT
ELECT CAUTERY BLADE 6.4 (BLADE) ×1 IMPLANT
ELECT REM PT RETURN 9FT ADLT (ELECTROSURGICAL) ×1
ELECTRODE REM PT RTRN 9FT ADLT (ELECTROSURGICAL) ×1 IMPLANT
GLOVE BIO SURGEON STRL SZ7.5 (GLOVE) ×2 IMPLANT
GLOVE BIOGEL M STRL SZ7.5 (GLOVE) ×2 IMPLANT
GLOVE PI ORTHO PRO STRL 7.5 (GLOVE) ×2 IMPLANT
GLOVE SURG UNDER LTX SZ8 (GLOVE) ×1 IMPLANT
GLOVE SURG UNDER POLY LF SZ7.5 (GLOVE) ×1 IMPLANT
GOWN STRL REUS W/ TWL LRG LVL3 (GOWN DISPOSABLE) ×2 IMPLANT
GOWN STRL REUS W/ TWL XL LVL3 (GOWN DISPOSABLE) ×1 IMPLANT
GOWN STRL REUS W/TWL LRG LVL3 (GOWN DISPOSABLE) ×2
GOWN STRL REUS W/TWL XL LVL3 (GOWN DISPOSABLE) ×1
HEAD M SROM 36MM PLUS 1.5 (Hips) IMPLANT
HEMOVAC 400CC 10FR (MISCELLANEOUS) ×1 IMPLANT
HOLDER FOLEY CATH W/STRAP (MISCELLANEOUS) ×1 IMPLANT
HOLSTER ELECTROSUGICAL PENCIL (MISCELLANEOUS) ×1 IMPLANT
HOOD PEEL AWAY FLYTE STAYCOOL (MISCELLANEOUS) ×2 IMPLANT
IV NS IRRIG 3000ML ARTHROMATIC (IV SOLUTION) ×1 IMPLANT
KIT PEG BOARD PINK (KITS) ×1 IMPLANT
KIT TURNOVER KIT A (KITS) ×1 IMPLANT
LINER MARA 4 10D 36X64 PL4 IMPLANT
MANIFOLD NEPTUNE II (INSTRUMENTS) ×2 IMPLANT
NDL SAFETY ECLIP 18X1.5 (MISCELLANEOUS) ×1 IMPLANT
NS IRRIG 1000ML POUR BTL (IV SOLUTION) ×1 IMPLANT
PACK HIP PROSTHESIS (MISCELLANEOUS) ×1 IMPLANT
PENCIL SMOKE EVACUATOR COATED (MISCELLANEOUS) ×1 IMPLANT
PULSAVAC PLUS IRRIG FAN TIP (DISPOSABLE) ×1
SCREW 6.5MMX25MM (Screw) IMPLANT
SOL PREP PVP 2OZ (MISCELLANEOUS) ×1
SOLUTION IRRIG SURGIPHOR (IV SOLUTION) ×1 IMPLANT
SOLUTION PREP PVP 2OZ (MISCELLANEOUS) ×1 IMPLANT
SPONGE DRAIN TRACH 4X4 STRL 2S (GAUZE/BANDAGES/DRESSINGS) ×1 IMPLANT
SROM M HEAD 36MM PLUS 1.5 (Hips) ×1 IMPLANT
STAPLER SKIN PROX 35W (STAPLE) ×1 IMPLANT
STEM FEMORAL SZ5 HIGH ACTIS (Stem) IMPLANT
SUT ETHIBOND #5 BRAIDED 30INL (SUTURE) ×1 IMPLANT
SUT VIC AB 0 CT1 36 (SUTURE) ×1 IMPLANT
SUT VIC AB 1 CT1 36 (SUTURE) ×2 IMPLANT
SUT VIC AB 2-0 CT1 27 (SUTURE) ×1
SUT VIC AB 2-0 CT1 TAPERPNT 27 (SUTURE) ×1 IMPLANT
SYR 20ML LL LF (SYRINGE) ×1 IMPLANT
TAPE CLOTH 3X10 WHT NS LF (GAUZE/BANDAGES/DRESSINGS) ×1 IMPLANT
TAPE TRANSPORE STRL 2 31045 (GAUZE/BANDAGES/DRESSINGS) ×1 IMPLANT
TIP FAN IRRIG PULSAVAC PLUS (DISPOSABLE) ×1 IMPLANT
TOWEL OR 17X26 4PK STRL BLUE (TOWEL DISPOSABLE) IMPLANT
TRAP FLUID SMOKE EVACUATOR (MISCELLANEOUS) ×1 IMPLANT
TRAY FOLEY MTR SLVR 16FR STAT (SET/KITS/TRAYS/PACK) ×1 IMPLANT
TUBE KAMVAC SUCTION (TUBING) ×1 IMPLANT
WATER STERILE IRR 1000ML POUR (IV SOLUTION) ×1 IMPLANT

## 2021-10-21 NOTE — Anesthesia Procedure Notes (Signed)
Spinal  Patient location during procedure: OR End time: 10/21/2021 7:45 AM Reason for block: surgical anesthesia Staffing Performed: anesthesiologist  Anesthesiologist: Molli Barrows, MD Resident/CRNA: Jonna Clark, CRNA Performed by: Jonna Clark, CRNA Authorized by: Jonna Clark, CRNA   Preanesthetic Checklist Completed: patient identified, IV checked, site marked, risks and benefits discussed, surgical consent, monitors and equipment checked, pre-op evaluation and timeout performed Spinal Block Patient position: sitting Prep: Betadine Patient monitoring: heart rate, continuous pulse ox, blood pressure and cardiac monitor Approach: midline Location: L4-5 Injection technique: single-shot Needle Needle type: Introducer and Quincke  Needle gauge: 22 G Needle length: 9 cm Assessment Events: CSF return Additional Notes Negative paresthesia. Negative blood return. Positive free-flowing CSF. Expiration date of kit checked and confirmed. Patient tolerated procedure well, without complications.

## 2021-10-21 NOTE — Interval H&P Note (Signed)
History and Physical Interval Note:  10/21/2021 6:04 AM  Maxwell Stanley  has presented today for surgery, with the diagnosis of PRIMARY OSTEOARTHRITIS OF RIGHT HIP.[name]  The various methods of treatment have been discussed with the patient and family. After consideration of risks, benefits and other options for treatment, the patient has consented to  Procedure(s): TOTAL HIP ARTHROPLASTY (Right) as a surgical intervention.  The patient's history has been reviewed, patient examined, no change in status, stable for surgery.  I have reviewed the patient's chart and labs.  Questions were answered to the patient's satisfaction.     [place]

## 2021-10-21 NOTE — Op Note (Signed)
OPERATIVE NOTE  DATE OF SURGERY:  10/21/2021  PATIENT NAME:  Maxwell Stanley   DOB: 12-19-1934  MRN: [id number]  PRE-OPERATIVE DIAGNOSIS: Degenerative arthrosis of the right hip, primary  POST-OPERATIVE DIAGNOSIS:  Same  PROCEDURE:  Right total hip arthroplasty  SURGEON:  Marciano Sequin. M.D.  ASSISTANT: Cassell Smiles, PA-C (present and scrubbed throughout the case, critical for assistance with exposure, retraction, instrumentation, and closure)  ANESTHESIA: spinal  ESTIMATED BLOOD LOSS: 100 mL  FLUIDS REPLACED: 800 mL of crystalloid  DRAINS: 2 medium Hemovac drains  IMPLANTS UTILIZED: DePuy size 5 high offset Actis femoral stem, 64 mm OD Pinnacle GRIPTION Sector acetabular component, 6.5 x 25 mm cancellous screw, +4 mm 10 degree Pinnacle Marathon polyethylene insert, and a 36 mm M-SPEC +1.5 mm hip ball  INDICATIONS FOR SURGERY: Maxwell Stanley is a 87 y.o. year old male with a long history of progressive hip and groin  pain. X-rays demonstrated severe degenerative changes. The patient had not seen any significant improvement despite conservative nonsurgical intervention. After discussion of the risks and benefits of surgical intervention, the patient expressed understanding of the risks benefits and agree with plans for total hip arthroplasty.   The risks, benefits, and alternatives were discussed at length including but not limited to the risks of infection, bleeding, nerve injury, stiffness, blood clots, the need for revision surgery, limb length inequality, dislocation, cardiopulmonary complications, among others, and they were willing to proceed.  PROCEDURE IN DETAIL: The patient was brought into the operating room and, after adequate spinal anesthesia was achieved, the patient was placed in a left lateral decubitus position. Axillary roll was placed and all bony prominences were well-padded. The patient's right hip was cleaned and prepped with alcohol and DuraPrep and draped  in the usual sterile fashion. A "timeout" was performed as per usual protocol. A lateral curvilinear incision was made gently curving towards the posterior superior iliac spine. The IT band was incised in line with the skin incision and the fibers of the gluteus maximus were split in line. The piriformis tendon was identified, skeletonized, and incised at its insertion to the proximal femur and reflected posteriorly. A T type posterior capsulotomy was performed. Prior to dislocation of the femoral head, a threaded Steinmann pin was inserted through a separate stab incision into the pelvis superior to the acetabulum and bent in the form of a stylus so as to assess limb length and hip offset throughout the procedure. The femoral head was then dislocated posteriorly. Inspection of the femoral head demonstrated severe degenerative changes with full-thickness loss of articular cartilage. The femoral neck cut was performed using an oscillating saw. The anterior capsule was elevated off of the femoral neck using a periosteal elevator. Attention was then directed to the acetabulum. The remnant of the labrum was excised using electrocautery. Inspection of the acetabulum also demonstrated significant degenerative changes. The acetabulum was reamed in sequential fashion up to a 63 mm diameter. Good punctate bleeding bone was encountered. A 64 mm Pinnacle GRIPTION Sector acetabular component was positioned and impacted into place. Good scratch fit was appreciated.  A 6.5 mm x 25 mm cancellous screw was inserted through one of the dome holes.  A +4 mm neutral polyethylene trial was inserted.  Attention was then directed to the proximal femur.  Femoral broaches were inserted in a sequential fashion up to a size 5 broach. Calcar region was planed and a trial reduction was performed using a high offset neck and a 36 mm  hip ball with a +1.5 mm neck length.  Reasonably good stability was noted it was elected to trial with a +4 mm  10 degree offset with the high side positioned at the 8 o'clock position good equalization of limb lengths and hip offset was appreciated and excellent stability was noted both anteriorly and posteriorly. Trial components were removed. The acetabular shell was irrigated with copious amounts of normal saline with antibiotic solution and suctioned dry. A +4 mm 10 degree Pinnacle Marathon polyethylene insert was positioned with the high side at the 8 o'clock position and impacted into place. Next, a size 5 high offset Actis femoral stem was positioned and impacted into place. Excellent scratch fit was appreciated. A trial reduction was again performed with a 36 mm hip ball with a +1.5 mm neck length. Again, good equalization of limb lengths was appreciated and excellent stability appreciated both anteriorly and posteriorly. The hip was then dislocated and the trial hip ball was removed. The Morse taper was cleaned and dried. A 36 mm M-SPEC hip ball with a +1.5 mm neck length was placed on the trunnion and impacted into place. The hip was then reduced and placed through range of motion. Excellent stability was appreciated both anteriorly and posteriorly.  The wound was irrigated with copious amounts of normal saline followed by 450 ml of Surgiphor and suctioned dry. Good hemostasis was appreciated. The posterior capsulotomy was repaired using #5 Ethibond. Piriformis tendon was reapproximated to the undersurface of the gluteus medius tendon using #5 Ethibond. The IT band was reapproximated using interrupted sutures of #1 Vicryl. Subcutaneous tissue was approximated using first #0 Vicryl followed by #2-0 Vicryl. The skin was closed with skin staples.  The patient tolerated the procedure well and was transported to the recovery room in stable condition.   Marciano Sequin., M.D.

## 2021-10-21 NOTE — Anesthesia Preprocedure Evaluation (Signed)
Anesthesia Evaluation  Patient identified by MRN, date of birth, ID band Patient awake    Reviewed: Allergy & Precautions, H&P , NPO status , Patient's Chart, lab work & pertinent test results, reviewed documented beta blocker date and time   History of Anesthesia Complications (+) PONV and history of anesthetic complications  Airway Mallampati: III   Neck ROM: full    Dental  (+) Poor Dentition   Pulmonary neg pulmonary ROS,    Pulmonary exam normal        Cardiovascular Exercise Tolerance: Good hypertension, On Medications negative cardio ROS Normal cardiovascular exam Rhythm:regular Rate:Normal     Neuro/Psych  Neuromuscular disease negative psych ROS   GI/Hepatic Neg liver ROS, GERD  ,  Endo/Other  negative endocrine ROSdiabetes, Well Controlled, Type 2, Oral Hypoglycemic Agents  Renal/GU CRFRenal disease  negative genitourinary   Musculoskeletal   Abdominal   Peds  Hematology  (+) Blood dyscrasia, anemia ,   Anesthesia Other Findings Past Medical History: No date: A-fib [facility])     Comment:  a.) CHA2DS2VASc = 5 (age x2, HTN, vascular disease               history, T2DM);  b.) s/p Watchman 08/21/2021; c.)               rate/rhythm maintained on oral atenolol; chronically               anticoagulated with reduced dose apixaban with plans to               transition to DAPT (ASA + clopidogrel) x 4 months               following THA on 10/21/2021 No date: Allergy No date: Aortic atherosclerosis ([place]) 11/28/2019: Basal cell carcinoma     Comment:  R neck post auricular, EDC  NODULAR AND INFILTRATIVE               PATTERNS 12/21/2019: Basal cell carcinoma     Comment:  R lateral calf, treated wiht [facility] 01/26/2020 06/19/2020: BCC (basal cell carcinoma)     Comment:  left distal calf, EDC 08/09/2020 No date: Bilateral inguinal hernia     Comment:  a.) s/p repair No date: 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 No date: Carpal tunnel syndrome on left 11/2018: Cataract No date: Chronic back pain No date: CKD stage 3 secondary to diabetes ([place]) No date: DDD (degenerative disc disease), lumbar 63/78/5885: Diastolic dysfunction     Comment:  a.) TTE 11/19/2020: EF >55%, mild LVH, mod BAE, mild               RVE, triv AR/PR, mod MR/TR, G1DD No date: Elevated PSA No date: GERD (gastroesophageal reflux disease) 08/21/2020: History of basal cell carcinoma (BCC)     Comment:  right chest pectoral tx'd with Moh's 08/21/2020 2021: History of squamous cell carcinoma of skin     Comment:  left ear/Moh's No date: Hyperlipidemia No date: Hypertension No date: IDA (iron deficiency anemia) No date: Leg varices No date: Lumbar spinal stenosis No date: Nocturia 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 No date: Osteoarthritis No date: PONV (postoperative nausea and vomiting) 08/21/2021: Presence of Watchman left atrial appendage closure device 06/19/2020: Squamous cell carcinoma in situ (SCCIS)     Comment:  left proximal calf, EDC 08/09/2020 11/28/2019: Squamous cell carcinoma of skin     Comment:  L ear sup helix  WELL DIFFERENTIATED No date: T2DM (type 2 diabetes mellitus) ([place])  No date: Thrombophilia (HCC) No date: Type 2 DM with CKD stage 3 and hypertension ([place]) No date: Umbilical hernia     Comment:  a.) s/p repair Past Surgical History: 1998: ANKLE ARTHROSCOPY WITH OPEN REDUCTION INTERNAL FIXATION (ORIF);  Left No date: COLONOSCOPY 05/28/2021: COLONOSCOPY WITH PROPOFOL; N/A     Comment:  Procedure: COLONOSCOPY WITH PROPOFOL;  Surgeon: Lucilla Lame, MD;  Location: [facility] END[facility]COPY;  Service:               Endoscopy;  Laterality: N/A; 05/28/2021: ESOPHAGOGASTRODUODENOSCOPY (EGD) WITH PROPOFOL; N/A     Comment:  Procedure: ESOPHAGOGASTRODUODENOSCOPY (EGD) WITH               PROPOFOL;  Surgeon: Lucilla Lame, MD;  Location: [facility]               ENDOSCOPY;  Service: Endoscopy;   Laterality: N/A; 03/08/2020: ETHMOIDECTOMY; Left     Comment:  Procedure: TOTAL ETHMOIDECTOMY;  Surgeon: Margaretha Sheffield,              MD;  Location: [place];  Service: ENT;                Laterality: Left; 03/08/2020: FRONTAL SINUS EXPLORATION; Left     Comment:  Procedure: FRONTAL SINUS EXPLORATION;  Surgeon: Margaretha Sheffield, MD;  Location: [place];  Service: ENT;               Laterality: Left; 07/11/2021: GIVENS CAPSULE STUDY; N/A     Comment:  Procedure: GIVENS CAPSULE STUDY;  Surgeon: Lucilla Lame,              MD;  Location: [facility] END[facility];  Service: Endoscopy;                Laterality: N/A; No date: HEMORRHOID SURGERY 03/08/2020: IMAGE GUIDED SINUS SURGERY; N/A     Comment:  Procedure: IMAGE GUIDED SINUS SURGERY;  Surgeon:               Margaretha Sheffield, MD;  Location: [place];                Service: ENT;  Laterality: N/A;  placed disk on OR charge              nurse desk 2-21 kp PLACED 2ND DISK ON OR CHARGE NURSE               DESK 02-28-20 KP 3RD DISK ON OR CHARGE NURSE DESK 2-25-               [place] 11/12/2018: [place]; Right     Comment:  Procedure: HERNIA REPAIR INGUINAL ADULT;  Surgeon:               Fredirick Maudlin, MD;  Location: [facility];  Service:               General;  Laterality: Right; 2012: [place]; Left 04/16/2020: KNEE ARTHROPLASTY; Right     Comment:  Procedure: COMPUTER ASSISTED TOTAL KNEE ARTHROPLASTY;                Surgeon: Dereck Leep, MD;  Location: [facility] OR[facility];                Service: Orthopedics;  Laterality: Right; 2006: KNEE ARTHROSCOPY; Right 03/08/2020: MAXILLARY ANTROSTOMY;  Left     Comment:  Procedure: MAXILLARY ANTROSTOMY WITH TISSUE REMOVAL;                Surgeon: Margaretha Sheffield, MD;  Location: [place];  Service: ENT;  Laterality: Left; 03/08/2020: SEPTOPLASTY; N/A     Comment:  Procedure: SEPTOPLASTY;  Surgeon: Margaretha Sheffield, MD;                 Location: [place];  Service: ENT;                Laterality: N/A; 04/07/2018: skin cancer resection; Right     Comment:  ON hand. 2013: THUMB ARTHROSCOPY; Right     Comment:  tendon flap to joint No date: TONSILLECTOMY 1610: [place]; Left No date: UPPER GI ENDOSCOPY BMI    Body Mass Index: 22.36 kg/m     Reproductive/Obstetrics negative OB ROS                             Anesthesia Physical Anesthesia Plan  ASA: 3  Anesthesia Plan: Spinal   Post-op Pain Management:    Induction:   PONV Risk Score and Plan: 4 or greater  Airway Management Planned:   Additional Equipment:   Intra-op Plan:   Post-operative Plan:   Informed Consent: I have reviewed the patients History and Physical, chart, labs and discussed the procedure including the risks, benefits and alternatives for the proposed anesthesia with the patient or authorized representative who has indicated his/her understanding and acceptance.     Dental Advisory Given  Plan Discussed with: CRNA  Anesthesia Plan Comments:         Anesthesia Quick Evaluation

## 2021-10-21 NOTE — Transfer of Care (Signed)
Immediate Anesthesia Transfer of Care Note  Patient: Maxwell Stanley  Procedure(s) Performed: TOTAL HIP ARTHROPLASTY (Right: Hip)  Patient Location: PACU  Anesthesia Type:Spinal  Level of Consciousness: awake, alert  and oriented  Airway & Oxygen Therapy: Patient Spontanous Breathing  Post-op Assessment: Report given to RN and Post -op Vital signs reviewed and stable  Post vital signs: Reviewed and stable  Last Vitals:  Vitals Value Taken Time  BP 109/79 10/21/21 1130  Temp    Pulse 62 10/21/21 1130  Resp 21 10/21/21 1130  SpO2 98 % 10/21/21 1130  Vitals shown include unvalidated device data.  Last Pain:  Vitals:   10/21/21 0619  TempSrc: Temporal  PainSc: 6          Complications: No notable events documented.

## 2021-10-21 NOTE — Evaluation (Signed)
Physical Therapy Evaluation Patient Details Name: Maxwell Stanley MRN: [id number] DOB: [birth year] Today's Date: 10/21/2021  History of Present Illness  Maxwell Stanley is an 89yoM who come to [facility] for Rt THA with Dr. Marry Guan.  Clinical Impression  Pt admitted c above Dx. Pt shows functional limitations due to the deficits listed below (see "PT Problem List"). Patient agreeable to PT evaluation. PLOF and home setup obtained. Pt able to perform bed mobility, transfers, and AMB with greater than typical effort, requires modA to get to EOB, tactile cues to maintain hip precautions. Pt tolerates AMB to BR, time up in standing for voiding, then AMB around the hallway prior to return to recline. Patient's assessment reveals acute need for additional person for safety and/or physical assistance to complete their typical ADL. At baseline, the patient is able to perform ADL with modified independence. Patient will benefit from skilled PT intervention to maximize independence and safety in mobility required for basic ADL performance at discharge.        Recommendations for follow up therapy are one component of a multi-disciplinary discharge planning process, led by the attending physician.  Recommendations may be updated based on patient status, additional functional criteria and insurance authorization.  Follow Up Recommendations Follow physician's recommendations for discharge plan and follow up therapies      Assistance Recommended at Discharge Set up Supervision/Assistance  Patient can return home with the following  Direct supervision/assist for medications management;A little help with bathing/dressing/bathroom;Help with stairs or ramp for entrance    Equipment Recommendations None recommended by PT  Recommendations for Other Services       Functional Status Assessment Patient has had a recent decline in their functional status and demonstrates the ability to make significant improvements in  function in a reasonable and predictable amount of time.     Precautions / Restrictions Precautions Precautions: Posterior Hip;Fall Precaution Booklet Issued: Yes (comment) Restrictions Weight Bearing Restrictions: Yes RLE Weight Bearing: Weight bearing as tolerated      Mobility  Bed Mobility Overal bed mobility: Needs Assistance Bed Mobility: Supine to Sit     Supine to sit: Mod assist     General bed mobility comments: tactile cues needed for precautions    Transfers Overall transfer level: Needs assistance Equipment used: Rolling walker (2 wheels) Transfers: Sit to/from Stand Sit to Stand: Min guard                Ambulation/Gait   Gait Distance (Feet): 80 Feet Assistive device: Rolling walker (2 wheels) Gait Pattern/deviations: Step-to pattern          Stairs            Wheelchair Mobility    Modified Rankin (Stroke Patients Only)       Balance                                             Pertinent Vitals/Pain      Home Living Family/patient expects to be discharged to:: Private residence Living Arrangements: Spouse/significant other Available Help at Discharge: Family Type of Home: House Home Access: Stairs to enter Entrance Stairs-Rails: Right Entrance Stairs-Number of Steps: 5   Home Layout: One level Home Equipment: Rollator (4 wheels);Cane - single point;Toilet riser      Prior Function Prior Level of Function : Independent/Modified Independent;Driving  Hand Dominance        Extremity/Trunk Assessment                Communication      Cognition                                                General Comments      Exercises     Assessment/Plan    PT Assessment Patient needs continued PT services  PT Problem List Decreased strength;Decreased range of motion;Decreased activity tolerance;Decreased balance;Decreased mobility       PT  Treatment Interventions DME instruction;Therapeutic exercise;Gait training;Stair training;Functional mobility training;Therapeutic activities;Balance training;Cognitive remediation;Patient/family education    PT Goals (Current goals can be found in the Care Plan section)  Acute Rehab PT Goals Patient Stated Goal: recover at home PT Goal Formulation: With patient Time For Goal Achievement: 11/04/21 Potential to Achieve Goals: Good    Frequency BID     Co-evaluation               AM-PAC PT "6 Clicks" Mobility  Outcome Measure Help needed turning from your back to your side while in a flat bed without using bedrails?: A Lot Help needed moving from lying on your back to sitting on the side of a flat bed without using bedrails?: A Lot Help needed moving to and from a bed to a chair (including a wheelchair)?: A Little Help needed standing up from a chair using your arms (e.g., wheelchair or bedside chair)?: A Little Help needed to walk in hospital room?: A Little Help needed climbing 3-5 steps with a railing? : A Little 6 Click Score: 16    End of Session   Activity Tolerance: Patient tolerated treatment well Patient left: in chair;with family/visitor present;with call bell/phone within reach Nurse Communication: Mobility status PT Visit Diagnosis: Other abnormalities of gait and mobility (R26.89)    Time: 7793-9030 PT Time Calculation (min) (ACUTE ONLY): 27 min   Charges:   PT Evaluation $PT Eval Low Complexity: 1 Low PT Treatments $Therapeutic Activity: 8-22 mins       9:46 PM, 10/21/21 Etta Grandchild, PT, DPT Physical Therapist - [facility]  [phone] ([place])    Miu Chiong C 10/21/2021, 9:44 PM

## 2021-10-22 ENCOUNTER — Encounter: Payer: Self-pay | Admitting: Orthopedic Surgery

## 2021-10-22 DIAGNOSIS — M1611 Unilateral primary osteoarthritis, right hip: Secondary | ICD-10-CM | POA: Diagnosis not present

## 2021-10-22 DIAGNOSIS — Z7984 Long term (current) use of oral hypoglycemic drugs: Secondary | ICD-10-CM | POA: Diagnosis not present

## 2021-10-22 DIAGNOSIS — N183 Chronic kidney disease, stage 3 unspecified: Secondary | ICD-10-CM | POA: Diagnosis not present

## 2021-10-22 DIAGNOSIS — I129 Hypertensive chronic kidney disease with stage 1 through stage 4 chronic kidney disease, or unspecified chronic kidney disease: Secondary | ICD-10-CM | POA: Diagnosis not present

## 2021-10-22 DIAGNOSIS — Z7901 Long term (current) use of anticoagulants: Secondary | ICD-10-CM | POA: Diagnosis not present

## 2021-10-22 DIAGNOSIS — E1122 Type 2 diabetes mellitus with diabetic chronic kidney disease: Secondary | ICD-10-CM | POA: Diagnosis not present

## 2021-10-22 DIAGNOSIS — Z7902 Long term (current) use of antithrombotics/antiplatelets: Secondary | ICD-10-CM | POA: Diagnosis not present

## 2021-10-22 DIAGNOSIS — Z85828 Personal history of other malignant neoplasm of skin: Secondary | ICD-10-CM | POA: Diagnosis not present

## 2021-10-22 DIAGNOSIS — Z7982 Long term (current) use of aspirin: Secondary | ICD-10-CM | POA: Diagnosis not present

## 2021-10-22 DIAGNOSIS — Z79899 Other long term (current) drug therapy: Secondary | ICD-10-CM | POA: Diagnosis not present

## 2021-10-22 DIAGNOSIS — I4819 Other persistent atrial fibrillation: Secondary | ICD-10-CM | POA: Diagnosis not present

## 2021-10-22 LAB — GLUCOSE, CAPILLARY
Glucose-Capillary: 143 mg/dL — ABNORMAL HIGH (ref 70–99)
Glucose-Capillary: 146 mg/dL — ABNORMAL HIGH (ref 70–99)

## 2021-10-22 MED ORDER — CELECOXIB 200 MG PO CAPS
ORAL_CAPSULE | ORAL | Status: AC
  Administered 2021-10-22: 200 mg via ORAL
  Filled 2021-10-22: qty 1

## 2021-10-22 MED ORDER — PANTOPRAZOLE SODIUM 40 MG PO TBEC
DELAYED_RELEASE_TABLET | ORAL | Status: AC
  Administered 2021-10-22: 40 mg via ORAL
  Filled 2021-10-22: qty 1

## 2021-10-22 MED ORDER — ACETAMINOPHEN 10 MG/ML IV SOLN
INTRAVENOUS | Status: AC
  Administered 2021-10-22: 1000 mg via INTRAVENOUS
  Filled 2021-10-22: qty 100

## 2021-10-22 MED ORDER — METOCLOPRAMIDE HCL 10 MG PO TABS
ORAL_TABLET | ORAL | Status: AC
  Administered 2021-10-22: 10 mg via ORAL
  Filled 2021-10-22: qty 1

## 2021-10-22 MED ORDER — TRAMADOL HCL 50 MG PO TABS
ORAL_TABLET | ORAL | Status: AC
  Filled 2021-10-22: qty 1

## 2021-10-22 MED ORDER — INSULIN ASPART 100 UNIT/ML IJ SOLN
INTRAMUSCULAR | Status: AC
  Filled 2021-10-22: qty 1

## 2021-10-22 MED ORDER — SENNOSIDES-DOCUSATE SODIUM 8.6-50 MG PO TABS
ORAL_TABLET | ORAL | Status: AC
  Administered 2021-10-22: 1 via ORAL
  Filled 2021-10-22: qty 1

## 2021-10-22 MED ORDER — MAGNESIUM HYDROXIDE 400 MG/5ML PO SUSP
ORAL | Status: AC
  Administered 2021-10-22: 30 mL via ORAL
  Filled 2021-10-22: qty 30

## 2021-10-22 MED ORDER — FERROUS SULFATE 325 (65 FE) MG PO TABS
ORAL_TABLET | ORAL | Status: AC
  Administered 2021-10-22: 325 mg via ORAL
  Filled 2021-10-22: qty 1

## 2021-10-22 MED ORDER — TRAMADOL HCL 50 MG PO TABS: 50.0000 mg | ORAL_TABLET | ORAL | 0 refills | Status: DC | PRN

## 2021-10-22 NOTE — Discharge Summary (Signed)
Physician Discharge Summary  Patient ID: Maxwell Stanley MRN: [id number] DOB/AGE: 1934/01/26 87 y.o.  Admit date: 10/21/2021 Discharge date: 10/22/2021  Admission Diagnoses:  Hx of total hip arthroplasty, right [Z96.641]  Surgeries:Procedure(s): Right total hip arthroplasty   SURGEON:  Maxwell Stanley. M.D.   ASSISTANT: Maxwell Smiles, PA-C (present and scrubbed throughout the case, critical for assistance with exposure, retraction, instrumentation, and closure)   ANESTHESIA: spinal   ESTIMATED BLOOD LOSS: 100 mL   FLUIDS REPLACED: 800 mL of crystalloid   DRAINS: 2 medium Hemovac drains   IMPLANTS UTILIZED: DePuy size 5 high offset Actis femoral stem, 64 mm OD P[employer]acle GR[employer] acetabular component, 6.5 x 25 mm cancellous screw, +4 mm 10 degree [employer] polyethylene insert, and a 36 mm M-SPEC +1.5 mm hip ball Discharge Diagnoses: Patient Active Problem List   Diagnosis Date Noted   Hx of total hip arthroplasty, right 10/21/2021   Bilateral carotid artery stenosis 05/22/2021   Anemia 02/28/2021   Total knee replacement status 04/16/2020   Acquired thrombophilia ([place]) 02/15/2020   Primary osteoarthritis of both knees 05/19/2019   Gastroesophageal reflux disease with esophagitis 02/02/2019   Personal history of other malignant neoplasm of skin 12/17/2018   Persistent atrial fibrillation ([place]) 10/29/2018   Reducible right inguinal hernia 09/16/2018   DDD (degenerative disc disease), lumbar 12/23/2017   Primary osteoarthritis of right hip 12/23/2017   Onychomycosis of toenail 08/19/2017   Allergic rhinitis 08/08/2016   Carpal tunnel syndrome on left 12/12/2015   Chronic maxillary sinusitis 04/09/2015   Type II diabetes mellitus with complication ([place]) 96/75/9163   Chronic low back pain 09/21/2014   Abnormal prostate specific antigen 04/17/2014   Enlarged prostate with lower urinary tract symptoms (LUTS) 04/17/2014   CKD stage 3 secondary to diabetes  ([place]) 04/17/2014   Hyperlipidemia associated with type 2 diabetes mellitus ([place]) 04/17/2014   ED (erectile dysfunction) of organic origin 04/17/2014   Essential (primary) hypertension 04/17/2014   Back muscle spasm 04/17/2014   Degenerative arthritis of finger 04/17/2014   Breast development in males 04/17/2014   Leg varices 04/17/2014   EDEMA 08/24/2009    Past Medical History:  Diagnosis Date   A-fib [facility])    a.) CHA2DS2VASc = 5 (age x2, HTN, vascular disease history, T2DM);  b.) s/p Watchman 08/21/2021; c.) rate/rhythm maintained on oral atenolol; chronically anticoagulated with reduced dose apixaban with plans to transition to DAPT (ASA + clopidogrel) x 4 months following THA on 10/21/2021   Allergy    Aortic atherosclerosis ([place])    Basal cell carcinoma 11/28/2019   R neck post auricular, EDC  NODULAR AND INFILTRATIVE PATTERNS   Basal cell carcinoma 12/21/2019   R lateral calf, treated wiht EDC 01/26/2020   BCC (basal cell carcinoma) 06/19/2020   left distal calf, EDC 08/09/2020   Bilateral inguinal hernia    a.) s/p repair   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    Carpal tunnel syndrome on left    Cataract 11/2018   Chronic back pain    CKD stage 3 secondary to diabetes ([place])    DDD (degenerative disc disease), lumbar    Diastolic dysfunction 84/66/5993   a.) TTE 11/19/2020: EF >55%, mild LVH, mod BAE, mild RVE, triv AR/PR, mod MR/TR, G1DD   Elevated PSA    GERD (gastroesophageal reflux disease)    History of basal cell carcinoma (BCC) 08/21/2020   right chest pectoral tx'd with Moh's 08/21/2020   History of squamous cell carcinoma of  skin 2021   left ear/Moh's   Hyperlipidemia    Hypertension    IDA (iron deficiency anemia)    Leg varices    Lumbar spinal stenosis    Nocturia    Organic impotence    Osteoarthritis    PONV (postoperative nausea and vomiting)    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   Squamous cell  carcinoma in situ (SCCIS) 06/19/2020   left proximal calf, EDC 08/09/2020   Squamous cell carcinoma of skin 11/28/2019   L ear sup helix  WELL DIFFERENTIATED   T2DM (type 2 diabetes mellitus) ([place])    Thrombophilia ([place])    Type 2 DM with CKD stage 3 and hypertension ([place])    Umbilical hernia    a.) s/p repair     Transfusion:    Consultants (if any):   Discharged Condition: Improved  Hospital Course: Maxwell Stanley is an 87 y.o. male who was admitted 10/21/2021 with a diagnosis of right hip osteoarthritis and went to the operating room on 10/21/2021 and underwent right total hip arthroplasty through posterior approach. The patient received perioperative antibiotics for prophylaxis (see below). The patient tolerated the procedure well and was transported to PACU in stable condition. After meeting PACU criteria, the patient was subsequently transferred to the Orthopaedics/Rehabilitation unit.   The patient received DVT prophylaxis in the form of early mobilization, Foot Pumps, TED hose, and Eliquis . A sacral pad had been placed and heels were elevated off of the bed with rolled towels in order to protect skin integrity. Foley catheter was discontinued on postoperative day #0. Wound drains were discontinued on postoperative day #1. The surgical incision was healing well without signs of infection.  Physical therapy was initiated postoperatively for transfers, gait training, and strengthening. Occupational therapy was initiated for activities of daily living and evaluation for assisted devices. Rehabilitation goals were reviewed in detail with the patient. The patient made steady progress with physical therapy and physical therapy recommended discharge to Home.   The patient achieved the preliminary goals of this hospitalization and was felt to be medically and orthopaedically appropriate for discharge.  He was given perioperative antibiotics:  Anti-infectives (From admission, onward)     Start     Dose/Rate Route Frequency Ordered Stop   10/21/21 2013  ceFAZolin (ANCEF) 2-4 GM/100ML-% IVPB       Note to Pharmacy: Maxwell Stanley C: cabinet override      10/21/21 2013 10/21/21 2126   10/21/21 1415  ceFAZolin (ANCEF) IVPB 2g/100 mL premix        2 g 200 mL/hr over 30 Minutes Intravenous Every 6 hours 10/21/21 1400 10/21/21 2100   10/21/21 0650  ceFAZolin (ANCEF) 2-4 GM/100ML-% IVPB       Note to Pharmacy: Lorenza Cambridge J: cabinet override      10/21/21 0650 10/21/21 0757   10/21/21 0600  ceFAZolin (ANCEF) IVPB 2g/100 mL premix        2 g 200 mL/hr over 30 Minutes Intravenous On call to O.R. 10/20/21 2321 10/21/21 9381     .  Recent vital signs:  Vitals:   10/22/21 0359 10/22/21 0724  BP: 136/74 135/75  Pulse: 63 61  Resp: 15 15  Temp: 97.6 F (36.4 C) (!) 97.5 F (36.4 C)  SpO2: 95% 96%    Recent laboratory studies:  No results for input(s): "WBC", "HGB", "HCT", "PLT", "K", "CL", "CO2", "BUN", "CREATININE", "GLUCOSE", "CALCIUM", "LABPT", "INR" in the last 72 hours.  Diagnostic  Studies: DG Hip Port Unilat With Pelvis 1V Right  Result Date: 10/21/2021 CLINICAL DATA:  Status post right total hip arthroplasty. EXAM: DG HIP (WITH OR WITHOUT PELVIS) 1V PORT RIGHT COMPARISON:  None Available. FINDINGS: Right femoral and acetabular components are well situated. Expected postoperative changes are seen in the surrounding soft tissues including surgical drain. IMPRESSION: Status post right total hip arthroplasty. Electronically Signed   By: Marijo Conception M.D.   On: 10/21/2021 12:45    Discharge Medications:   Allergies as of 10/22/2021       Reactions   Codeine Other (See Comments)   Made his "Wild"   Ceftriaxone Rash   Penicillin G Rash   Did it involve swelling of the face/tongue/throat, SOB, or low BP? No Did it involve sudden or severe rash/hives, skin peeling, or any reaction on the inside of your mouth or nose? No Did you need to seek medical attention at a  hospital or doctor's office? No When did it last happen?       If all above answers are "NO", may proceed with cephalosporin use. IgE = 35 (WNL) on 01/18/2020        Medication List     TAKE these medications    acetaminophen 500 MG tablet Commonly known as: TYLENOL Take 1,000 mg by mouth 2 (two) times daily.   amLODipine 2.5 MG tablet Commonly known as: NORVASC TAKE 1 TABLET DAILY   apixaban 2.5 MG Tabs tablet Commonly known as: ELIQUIS Take 2.5 mg by mouth 2 (two) times daily.   atenolol 50 MG tablet Commonly known as: TENORMIN TAKE 1 TABLET AT BEDTIME What changed: when to take this   cholecalciferol 25 MCG (1000 UNIT) tablet Commonly known as: VITAMIN D3 Take 1,000 Units by mouth daily.   docusate sodium 100 MG capsule Commonly known as: COLACE Take 200 mg by mouth daily.   ferrous sulfate 325 (65 FE) MG EC tablet Take 1 tablet by mouth daily.   finasteride 5 MG tablet Commonly known as: PROSCAR TAKE 1 TABLET DAILY   fluticasone 50 MCG/ACT nasal spray Commonly known as: FLONASE Place 2 sprays into both nostrils daily. What changed:  when to take this reasons to take this   losartan 100 MG tablet Commonly known as: COZAAR TAKE 1/2 TABLET TWICE A DAY   Melatonin 10 MG Caps Take 10 mg by mouth at bedtime.   meloxicam 7.5 MG tablet Commonly known as: MOBIC Take 7.5 mg by mouth 2 (two) times daily.   metFORMIN 500 MG 24 hr tablet Commonly known as: GLUCOPHAGE-XR TAKE 1 TABLET DAILY WITH   BREAKFAST AND 2 TABLETS    EVERY EVENING. What changed:  how much to take when to take this additional instructions   multivitamin with minerals Tabs tablet Take 1 tablet by mouth daily.   mupirocin ointment 2 % Commonly known as: BACTROBAN APPLY TO AFFECTED AREA EVERY DAY What changed: See the new instructions.   OneTouch Delica Lancets 28B Misc 1 each by Does not apply route daily. Use to test blood sugar once daily.   OneTouch Ultra test  strip Generic drug: glucose blood Use as instructed   OSTEO BI-FLEX TRIPLE STRENGTH PO Take 2 tablets by mouth daily at 6 (six) AM.   pantoprazole 40 MG tablet Commonly known as: PROTONIX TAKE 1 TABLET TWICE A DAY  (CANCEL PRESCRIPTION FOR   PANTOPRAZOLE 40 MG 1 TABLET DAILY) What changed: See the new instructions.   PROBIOTIC DAILY PO Take 1  tablet by mouth daily.   rosuvastatin 20 MG tablet Commonly known as: CRESTOR Take 1 tablet (20 mg total) by mouth at bedtime.   sitaGLIPtin 100 MG tablet Commonly known as: Januvia Take 1 tablet (100 mg total) by mouth daily. What changed:  how much to take when to take this   tacrolimus 0.1 % ointment Commonly known as: PROTOPIC Apply topically 2 (two) times daily.   tamsulosin 0.4 MG Caps capsule Commonly known as: FLOMAX Take 1 capsule (0.4 mg total) by mouth daily after supper. What changed: when to take this   traMADol 50 MG tablet Commonly known as: ULTRAM Take 1 tablet (50 mg total) by mouth every 4 (four) hours as needed for moderate pain.   Transderm Scop (1.5 MG) 1 MG/3DAYS Generic drug: scopolamine Place 1 patch onto the skin See admin instructions. PRN for deep see fishing               Durable Medical Equipment  (From admission, onward)           Start     Ordered   10/21/21 1401  DME Walker rolling  Once       Question:  Patient needs a walker to treat with the following condition  Answer:  S/P total hip arthroplasty   10/21/21 1400   10/21/21 1401  DME Bedside commode  Once       Question:  Patient needs a bedside commode to treat with the following condition  Answer:  S/P total hip arthroplasty   10/21/21 1400            Disposition: Home with home health PT     Follow-up Information     Fausto Skillern, PA-C Follow up on 11/05/2021.   Specialty: Orthopedic Surgery Why: at 1:15pm Contact information: [place] [address] [phone]         Dereck Leep, MD Follow up on 12/03/2021.   Specialty: Orthopedic Surgery Why: at 2:00pm Contact information: [place] [address] [place], PA-C 10/22/2021, 11:56 AM

## 2021-10-22 NOTE — Anesthesia Postprocedure Evaluation (Signed)
Anesthesia Post Note  Patient: Maxwell Stanley  Procedure(s) Performed: TOTAL HIP ARTHROPLASTY (Right: Hip)  Patient location during evaluation: Nursing Unit Anesthesia Type: Spinal Level of consciousness: awake and alert and oriented Pain management: pain level controlled Vital Signs Assessment: post-procedure vital signs reviewed and stable Respiratory status: respiratory function stable Cardiovascular status: stable Postop Assessment: no headache, no backache, patient able to bend at knees, no apparent nausea or vomiting, able to ambulate and adequate PO intake Anesthetic complications: no   No notable events documented.   Last Vitals:  Vitals:   10/22/21 0359 10/22/21 0724  BP: 136/74 135/75  Pulse: 63 61  Resp: 15 15  Temp: 36.4 C (!) 36.4 C  SpO2: 95% 96%    Last Pain:  Vitals:   10/22/21 0724  TempSrc: Temporal  PainSc:                  [name]

## 2021-10-22 NOTE — Progress Notes (Signed)
  Subjective: 1 Day Post-Op Procedure(s) (LRB): TOTAL HIP ARTHROPLASTY (Right) Patient reports pain as well-controlled.   Patient is well, and has had no acute complaints or problems Plan is to go Home after hospital stay. Negative for chest pain and shortness of breath Fever: no Gastrointestinal: negative for nausea and vomiting.   Patient has not had a bowel movement.  Objective: Vital signs in last 24 hours: Temp:  [97.5 F (36.4 C)-98.5 F (36.9 C)] 97.5 F (36.4 C) (10/17 0724) Pulse Rate:  [58-78] 61 (10/17 0724) Resp:  [12-18] 15 (10/17 0724) BP: (109-150)/(67-81) 135/75 (10/17 0724) SpO2:  [95 %-99 %] 96 % (10/17 0724)  Intake/Output from previous day:  Intake/Output Summary (Last 24 hours) at 10/22/2021 0904 Last data filed at 10/22/2021 0736 Gross per 24 hour  Intake 1640 ml  Output 2425 ml  Net -785 ml    Intake/Output this shift: Total I/O In: -  Out: 55 [Drains:55]  Labs: No results for input(s): "HGB" in the last 72 hours. No results for input(s): "WBC", "RBC", "HCT", "PLT" in the last 72 hours. No results for input(s): "NA", "K", "CL", "CO2", "BUN", "CREATININE", "GLUCOSE", "CALCIUM" in the last 72 hours. No results for input(s): "LABPT", "INR" in the last 72 hours.   EXAM General - Patient is Alert, Appropriate, and Oriented Extremity - Neurovascular intact Dorsiflexion/Plantar flexion intact Compartment soft Dressing/Incision -clean, dry, no drainage, Hemovac in place.  Motor Function - intact, moving foot and toes well on exam.   Cardiovascular- Regular rate and rhythm, no murmurs/rubs/gallops Respiratory- Lungs clear to auscultation bilaterally Gastrointestinal- soft, nontender, and active bowel sounds   Assessment/Plan: 1 Day Post-Op Procedure(s) (LRB): TOTAL HIP ARTHROPLASTY (Right) Principal Problem:   Hx of total hip arthroplasty, right  Estimated body mass index is 22.36 kg/m as calculated from the following:   Height as of this  encounter: '6\' 2"'$  (1.88 m).   Weight as of this encounter: 79 kg. Advance diet Up with therapy  Anticipate d/c this PM pending completion of therapy goals.   Hemovac removed. and Mini compression dressing applied.   DVT Prophylaxis - Eliquis, Ted hose, and SCDs Weight-Bearing as tolerated to right leg  Cassell Smiles, PA-C [facility] Orthopaedic Surgery 10/22/2021, 9:04 AM

## 2021-10-22 NOTE — Progress Notes (Signed)
Physical Therapy Treatment Patient Details Name: Maxwell Stanley MRN: [id number] DOB: 01/05/1935 Today's Date: 10/22/2021   History of Present Illness Bowman Higbie is an 43yoM who come to [facility] for Rt THA with Dr. Marry Guan.    PT Comments    Pt received seated in recliner upon arrival to room and pt agreeable to therapy.  Pt notes he has been sitting in recliner for 45 minutes this morning and is ready to ambulate.  Pt noted to have improved strength and is able to tolerate transfers much better than initial evaluation.  Pt performed ambulation around the PACU and to the stairs to attempt stair training.  Pt ambulated up the stairs with bilateral rail assist forwards, but to mimic home use, pt was given task of only utilizing the R railing and ascending/descending sideways.  Pt able to tolerate multiple attempts and performed extremely well.  Pt then ambulated back to room where he elected to remain in recliner following session.  Pt left with all needs met upon leaving.  MD notified of pt's performance and recommendation of home d/c.  Current discharge plans to home with HHPT remain appropriate at this time.  Pt will continue to benefit from skilled therapy in order to address deficits listed below.      Recommendations for follow up therapy are one component of a multi-disciplinary discharge planning process, led by the attending physician.  Recommendations may be updated based on patient status, additional functional criteria and insurance authorization.  Follow Up Recommendations  Follow physician's recommendations for discharge plan and follow up therapies     Assistance Recommended at Discharge Set up Supervision/Assistance  Patient can return home with the following Direct supervision/assist for medications management;A little help with bathing/dressing/bathroom;Help with stairs or ramp for entrance   Equipment Recommendations  None recommended by PT    Recommendations for Other  Services       Precautions / Restrictions Precautions Precautions: Posterior Hip;Fall Precaution Booklet Issued: Yes (comment) Restrictions Weight Bearing Restrictions: Yes RLE Weight Bearing: Weight bearing as tolerated     Mobility  Bed Mobility               General bed mobility comments: Pt upright in recliner upon arrival.    Transfers Overall transfer level: Needs assistance Equipment used: Rolling walker (2 wheels) Transfers: Sit to/from Stand Sit to Stand: Supervision           General transfer comment: Pt able to safely perform transfer into sitting position    Ambulation/Gait   Gait Distance (Feet): 180 Feet Assistive device: Rolling walker (2 wheels) Gait Pattern/deviations: Step-through pattern       General Gait Details: Pt notes he is able to wlak better now than he was prior to the surgery.   Stairs Stairs: Yes Stairs assistance: Min guard Stair Management: Two rails, Alternating pattern, Forwards, One rail Right, Step to pattern, Sideways Number of Stairs: 10 General stair comments: Pt progressed from using both rails for initial attempt, to performing with only R rail to mimick home setup.   Wheelchair Mobility    Modified Rankin (Stroke Patients Only)       Balance                                            Cognition  Exercises      General Comments        Pertinent Vitals/Pain      Home Living                          Prior Function            PT Goals (current goals can now be found in the care plan section) Acute Rehab PT Goals Patient Stated Goal: recover at home PT Goal Formulation: With patient Time For Goal Achievement: 11/04/21 Potential to Achieve Goals: Good Progress towards PT goals: Progressing toward goals    Frequency    BID      PT Plan Current plan remains appropriate    Co-evaluation               AM-PAC PT "6 Clicks" Mobility   Outcome Measure  Help needed turning from your back to your side while in a flat bed without using bedrails?: A Little Help needed moving from lying on your back to sitting on the side of a flat bed without using bedrails?: A Little Help needed moving to and from a bed to a chair (including a wheelchair)?: A Little Help needed standing up from a chair using your arms (e.g., wheelchair or bedside chair)?: A Little Help needed to walk in hospital room?: A Little Help needed climbing 3-5 steps with a railing? : A Little 6 Click Score: 18    End of Session Equipment Utilized During Treatment: Gait belt Activity Tolerance: Patient tolerated treatment well Patient left: in chair;with family/visitor present;with call bell/phone within reach Nurse Communication: Mobility status PT Visit Diagnosis: Other abnormalities of gait and mobility (R26.89)     Time: 1010-1028 PT Time Calculation (min) (ACUTE ONLY): 18 min  Charges:  $Therapeutic Activity: 8-22 mins                      Gwenlyn Saran, PT, DPT 10/22/21, 10:49 AM

## 2021-10-22 NOTE — Evaluation (Signed)
Occupational Therapy Evaluation Patient Details Name: Maxwell Stanley MRN: [id number] DOB: 09/26/1934 Today's Date: 10/22/2021   History of Present Illness Maxwell Stanley is an 87 y/o male s/p R THA on 10/16.   Clinical Impression   Patient seen for OT evaluation. Pt presenting with decreased independence in self care, balance, functional mobility/transfers, and endurance. At baseline, pt is normally independent in ADLs/IADLs. Wife has been assisting with LB dressing past couple weeks 2/2 R hip pain. Pt currently functioning at supervision for toilet transfer using grab bar and supervision for functional mobility to the bathroom using RW. Pt is set up-supervision for most ADLs except LB dressing, which wife will be able to assist with as needed upon discharge. Pt was educated on RLE WBAT, posterior hip precautions, and LB dressing adaptive equipment. Pt required VC to maintain hip precautions during functional transfers. Pt already has a reacher and long handled shoe horn at home and is familiar with use. Patient will benefit from acute OT to increase overall independence in the areas of ADLs and functional mobility in order to safely discharge home.      Recommendations for follow up therapy are one component of a multi-disciplinary discharge planning process, led by the attending physician.  Recommendations may be updated based on patient status, additional functional criteria and insurance authorization.   Follow Up Recommendations  Follow physician's recommendations for discharge plan and follow up therapies    Assistance Recommended at Discharge Set up Supervision/Assistance  Patient can return home with the following A little help with walking and/or transfers;A little help with bathing/dressing/bathroom;Assistance with cooking/housework;Assist for transportation;Help with stairs or ramp for entrance    Functional Status Assessment  Patient has had a recent decline in their  functional status and demonstrates the ability to make significant improvements in function in a reasonable and predictable amount of time.  Equipment Recommendations  None recommended by OT    Recommendations for Other Services       Precautions / Restrictions Precautions Precautions: Posterior Hip;Fall Precaution Booklet Issued: No Restrictions Weight Bearing Restrictions: Yes RLE Weight Bearing: Weight bearing as tolerated      Mobility Bed Mobility               General bed mobility comments: Pt received and left in recliner    Transfers Overall transfer level: Needs assistance Equipment used: Rolling walker (2 wheels) Transfers: Sit to/from Stand Sit to Stand: Supervision           General transfer comment: VC and demonstration for safe technique in order to maintain hip precautions (hand placement on arm rests, kicking out RLE when standing/sitting, and keeping back straight)      Balance Overall balance assessment: Needs assistance Sitting-balance support: Feet supported Sitting balance-Leahy Scale: Good     Standing balance support: Bilateral upper extremity supported, No upper extremity supported Standing balance-Leahy Scale: Fair Standing balance comment: able to complete hand hygiene while standing at sink with no UE support, BUE support during functional mobility using RW                           ADL either performed or assessed with clinical judgement   ADL Overall ADL's : Needs assistance/impaired Eating/Feeding: Set up;Sitting   Grooming: Wash/dry hands;Standing;Supervision/safety               Lower Body Dressing: Maximal assistance;Sitting/lateral leans Lower Body Dressing Details (indicate cue type and reason): for socks Toilet Transfer:  Supervision/safety;Regular Toilet;Rolling walker (2 wheels);Grab bars Toilet Transfer Details (indicate cue type and reason): VC to maintain hip precautions   Toileting - Clothing  Manipulation Details (indicate cue type and reason): anticipate Min A for posterior hygiene in order to maintain hip precautions     Functional mobility during ADLs: Supervision/safety;Rolling walker (2 wheels) (to the bathroom)       Vision Baseline Vision/History: 1 Wears glasses Patient Visual Report: No change from baseline       Perception     Praxis      Pertinent Vitals/Pain Pain Assessment Pain Assessment: No/denies pain     Hand Dominance     Extremity/Trunk Assessment Upper Extremity Assessment Upper Extremity Assessment: Overall WFL for tasks assessed   Lower Extremity Assessment Lower Extremity Assessment: RLE deficits/detail RLE Deficits / Details: s/p THA   Cervical / Trunk Assessment Cervical / Trunk Assessment: Normal   Communication Communication Communication: No difficulties   Cognition Arousal/Alertness: Awake/alert Behavior During Therapy: WFL for tasks assessed/performed Overall Cognitive Status: Within Functional Limits for tasks assessed                                       General Comments       Exercises Other Exercises Other Exercises: OT provided education re: role of OT, OT POC, post acute recs, sitting up for all meals, EOB/OOB mobility with assistance, home/fall safety, RLE WBAT, posterior hip precautions, handout provided   Shoulder Instructions      Home Living Family/patient expects to be discharged to:: Private residence Living Arrangements: Spouse/significant other Available Help at Discharge: Family;Available 24 hours/day Type of Home: House Home Access: Stairs to enter [employer] of Steps: 5 Entrance Stairs-Rails: Right Home Layout: One level     Bathroom Shower/Tub: Chief Strategy Officer: Rollator (4 wheels);Cane - single point;Toilet riser;Adaptive equipment;Shower seat;Shower seat - built in;Hand held Architectural technologist: Reacher;Long-handled Chartered certified accountant Comments: Pt reports having a toilet riser that is very high along with having a counter directly to his right that he can lean on while standing      Prior Functioning/Environment Prior Level of Function : Independent/Modified Independent;Driving             Mobility Comments: denies history of falls ADLs Comments: Independent ADLs/IADLs. Wife has been assisting with LB dressing past couple weeks 2/2 R hip pain        OT Problem List: Decreased strength;Decreased range of motion;Decreased activity tolerance;Impaired balance (sitting and/or standing)      OT Treatment/Interventions: Self-care/ADL training;Therapeutic exercise;Patient/family education;Balance training;Energy conservation;Therapeutic activities;DME and/or AE instruction    OT Goals(Current goals can be found in the care plan section) Acute Rehab OT Goals Patient Stated Goal: go home OT Goal Formulation: With patient Time For Goal Achievement: 11/05/21 Potential to Achieve Goals: Good ADL Goals Pt Will Perform Grooming: with supervision;standing Pt Will Perform Lower Body Dressing: with set-up;with caregiver independent in assisting;with adaptive equipment;sit to/from stand Pt Will Transfer to Toilet: with supervision;ambulating;regular height toilet;grab bars (while maintaining posterior hip precautions) Pt Will Perform Toileting - Clothing Manipulation and hygiene: with set-up;with caregiver independent in assisting;with adaptive equipment;sit to/from stand  OT Frequency: Min 2X/week    Co-evaluation              AM-PAC OT "6 Clicks" Daily Activity     Outcome Measure Help  from another person eating meals?: A Little (set up) Help from another person taking care of personal grooming?: A Little (set up) Help from another person toileting, which includes using toliet, bedpan, or urinal?: A Little Help from another person bathing (including washing, rinsing, drying)?: A Lot Help from another  person to put on and taking off regular upper body clothing?: A Little (set up) Help from another person to put on and taking off regular lower body clothing?: A Lot 6 Click Score: 16   End of Session Equipment Utilized During Treatment: Rolling walker (2 wheels) Nurse Communication: Mobility status  Activity Tolerance: Patient tolerated treatment well Patient left: in chair;with call bell/phone within reach  OT Visit Diagnosis: Other abnormalities of gait and mobility (R26.89);Muscle weakness (generalized) (M62.81)                Time: 0349-1791 OT Time Calculation (min): 14 min Charges:  OT General Charges $OT Visit: 1 Visit OT Evaluation $OT Eval Low Complexity: 1 Low  [facility] MS, OTR/L ascom [phone]  10/22/21, 1:13 PM

## 2021-10-22 NOTE — Progress Notes (Signed)
Patient is doing excellent, has met all criteria to go home. No c/o pain or any discomfort. Instructions reviewed with patient wife and patient. Excited to go home.

## 2021-10-23 DIAGNOSIS — Z85828 Personal history of other malignant neoplasm of skin: Secondary | ICD-10-CM | POA: Diagnosis not present

## 2021-10-23 DIAGNOSIS — D631 Anemia in chronic kidney disease: Secondary | ICD-10-CM | POA: Diagnosis not present

## 2021-10-23 DIAGNOSIS — N320 Bladder-neck obstruction: Secondary | ICD-10-CM | POA: Diagnosis not present

## 2021-10-23 DIAGNOSIS — K219 Gastro-esophageal reflux disease without esophagitis: Secondary | ICD-10-CM | POA: Diagnosis not present

## 2021-10-23 DIAGNOSIS — Z7901 Long term (current) use of anticoagulants: Secondary | ICD-10-CM | POA: Diagnosis not present

## 2021-10-23 DIAGNOSIS — M5136 Other intervertebral disc degeneration, lumbar region: Secondary | ICD-10-CM | POA: Diagnosis not present

## 2021-10-23 DIAGNOSIS — Z7984 Long term (current) use of oral hypoglycemic drugs: Secondary | ICD-10-CM | POA: Diagnosis not present

## 2021-10-23 DIAGNOSIS — I6523 Occlusion and stenosis of bilateral carotid arteries: Secondary | ICD-10-CM | POA: Diagnosis not present

## 2021-10-23 DIAGNOSIS — I4819 Other persistent atrial fibrillation: Secondary | ICD-10-CM | POA: Diagnosis not present

## 2021-10-23 DIAGNOSIS — Z9181 History of falling: Secondary | ICD-10-CM | POA: Diagnosis not present

## 2021-10-23 DIAGNOSIS — E1121 Type 2 diabetes mellitus with diabetic nephropathy: Secondary | ICD-10-CM | POA: Diagnosis not present

## 2021-10-23 DIAGNOSIS — Z96641 Presence of right artificial hip joint: Secondary | ICD-10-CM | POA: Diagnosis not present

## 2021-10-23 DIAGNOSIS — Z96651 Presence of right artificial knee joint: Secondary | ICD-10-CM | POA: Diagnosis not present

## 2021-10-23 DIAGNOSIS — D696 Thrombocytopenia, unspecified: Secondary | ICD-10-CM | POA: Diagnosis not present

## 2021-10-23 DIAGNOSIS — I2510 Atherosclerotic heart disease of native coronary artery without angina pectoris: Secondary | ICD-10-CM | POA: Diagnosis not present

## 2021-10-23 DIAGNOSIS — N183 Chronic kidney disease, stage 3 unspecified: Secondary | ICD-10-CM | POA: Diagnosis not present

## 2021-10-23 DIAGNOSIS — Z471 Aftercare following joint replacement surgery: Secondary | ICD-10-CM | POA: Diagnosis not present

## 2021-10-23 DIAGNOSIS — H8110 Benign paroxysmal vertigo, unspecified ear: Secondary | ICD-10-CM | POA: Diagnosis not present

## 2021-10-23 DIAGNOSIS — E1122 Type 2 diabetes mellitus with diabetic chronic kidney disease: Secondary | ICD-10-CM | POA: Diagnosis not present

## 2021-10-23 DIAGNOSIS — I129 Hypertensive chronic kidney disease with stage 1 through stage 4 chronic kidney disease, or unspecified chronic kidney disease: Secondary | ICD-10-CM | POA: Diagnosis not present

## 2021-10-23 DIAGNOSIS — E785 Hyperlipidemia, unspecified: Secondary | ICD-10-CM | POA: Diagnosis not present

## 2021-10-23 LAB — SURGICAL PATHOLOGY

## 2021-10-25 DIAGNOSIS — I129 Hypertensive chronic kidney disease with stage 1 through stage 4 chronic kidney disease, or unspecified chronic kidney disease: Secondary | ICD-10-CM | POA: Diagnosis not present

## 2021-10-25 DIAGNOSIS — I4819 Other persistent atrial fibrillation: Secondary | ICD-10-CM | POA: Diagnosis not present

## 2021-10-25 DIAGNOSIS — I2510 Atherosclerotic heart disease of native coronary artery without angina pectoris: Secondary | ICD-10-CM | POA: Diagnosis not present

## 2021-10-25 DIAGNOSIS — Z96641 Presence of right artificial hip joint: Secondary | ICD-10-CM | POA: Diagnosis not present

## 2021-10-25 DIAGNOSIS — Z7984 Long term (current) use of oral hypoglycemic drugs: Secondary | ICD-10-CM | POA: Diagnosis not present

## 2021-10-25 DIAGNOSIS — E1122 Type 2 diabetes mellitus with diabetic chronic kidney disease: Secondary | ICD-10-CM | POA: Diagnosis not present

## 2021-10-25 DIAGNOSIS — D696 Thrombocytopenia, unspecified: Secondary | ICD-10-CM | POA: Diagnosis not present

## 2021-10-25 DIAGNOSIS — E785 Hyperlipidemia, unspecified: Secondary | ICD-10-CM | POA: Diagnosis not present

## 2021-10-25 DIAGNOSIS — E1121 Type 2 diabetes mellitus with diabetic nephropathy: Secondary | ICD-10-CM | POA: Diagnosis not present

## 2021-10-25 DIAGNOSIS — Z7901 Long term (current) use of anticoagulants: Secondary | ICD-10-CM | POA: Diagnosis not present

## 2021-10-25 DIAGNOSIS — Z471 Aftercare following joint replacement surgery: Secondary | ICD-10-CM | POA: Diagnosis not present

## 2021-10-25 DIAGNOSIS — M5136 Other intervertebral disc degeneration, lumbar region: Secondary | ICD-10-CM | POA: Diagnosis not present

## 2021-10-25 DIAGNOSIS — H8110 Benign paroxysmal vertigo, unspecified ear: Secondary | ICD-10-CM | POA: Diagnosis not present

## 2021-10-25 DIAGNOSIS — I6523 Occlusion and stenosis of bilateral carotid arteries: Secondary | ICD-10-CM | POA: Diagnosis not present

## 2021-10-25 DIAGNOSIS — D631 Anemia in chronic kidney disease: Secondary | ICD-10-CM | POA: Diagnosis not present

## 2021-10-25 DIAGNOSIS — K219 Gastro-esophageal reflux disease without esophagitis: Secondary | ICD-10-CM | POA: Diagnosis not present

## 2021-10-25 DIAGNOSIS — Z85828 Personal history of other malignant neoplasm of skin: Secondary | ICD-10-CM | POA: Diagnosis not present

## 2021-10-25 DIAGNOSIS — N183 Chronic kidney disease, stage 3 unspecified: Secondary | ICD-10-CM | POA: Diagnosis not present

## 2021-10-25 DIAGNOSIS — Z96651 Presence of right artificial knee joint: Secondary | ICD-10-CM | POA: Diagnosis not present

## 2021-10-25 DIAGNOSIS — N320 Bladder-neck obstruction: Secondary | ICD-10-CM | POA: Diagnosis not present

## 2021-10-25 DIAGNOSIS — Z9181 History of falling: Secondary | ICD-10-CM | POA: Diagnosis not present

## 2021-10-28 DIAGNOSIS — H8110 Benign paroxysmal vertigo, unspecified ear: Secondary | ICD-10-CM | POA: Diagnosis not present

## 2021-10-28 DIAGNOSIS — K219 Gastro-esophageal reflux disease without esophagitis: Secondary | ICD-10-CM | POA: Diagnosis not present

## 2021-10-28 DIAGNOSIS — M5136 Other intervertebral disc degeneration, lumbar region: Secondary | ICD-10-CM | POA: Diagnosis not present

## 2021-10-28 DIAGNOSIS — I4819 Other persistent atrial fibrillation: Secondary | ICD-10-CM | POA: Diagnosis not present

## 2021-10-28 DIAGNOSIS — I129 Hypertensive chronic kidney disease with stage 1 through stage 4 chronic kidney disease, or unspecified chronic kidney disease: Secondary | ICD-10-CM | POA: Diagnosis not present

## 2021-10-28 DIAGNOSIS — Z471 Aftercare following joint replacement surgery: Secondary | ICD-10-CM | POA: Diagnosis not present

## 2021-10-28 DIAGNOSIS — Z7901 Long term (current) use of anticoagulants: Secondary | ICD-10-CM | POA: Diagnosis not present

## 2021-10-28 DIAGNOSIS — N183 Chronic kidney disease, stage 3 unspecified: Secondary | ICD-10-CM | POA: Diagnosis not present

## 2021-10-28 DIAGNOSIS — I6523 Occlusion and stenosis of bilateral carotid arteries: Secondary | ICD-10-CM | POA: Diagnosis not present

## 2021-10-28 DIAGNOSIS — Z96651 Presence of right artificial knee joint: Secondary | ICD-10-CM | POA: Diagnosis not present

## 2021-10-28 DIAGNOSIS — E785 Hyperlipidemia, unspecified: Secondary | ICD-10-CM | POA: Diagnosis not present

## 2021-10-28 DIAGNOSIS — Z85828 Personal history of other malignant neoplasm of skin: Secondary | ICD-10-CM | POA: Diagnosis not present

## 2021-10-28 DIAGNOSIS — E1121 Type 2 diabetes mellitus with diabetic nephropathy: Secondary | ICD-10-CM | POA: Diagnosis not present

## 2021-10-28 DIAGNOSIS — D631 Anemia in chronic kidney disease: Secondary | ICD-10-CM | POA: Diagnosis not present

## 2021-10-28 DIAGNOSIS — N320 Bladder-neck obstruction: Secondary | ICD-10-CM | POA: Diagnosis not present

## 2021-10-28 DIAGNOSIS — Z9181 History of falling: Secondary | ICD-10-CM | POA: Diagnosis not present

## 2021-10-28 DIAGNOSIS — I2510 Atherosclerotic heart disease of native coronary artery without angina pectoris: Secondary | ICD-10-CM | POA: Diagnosis not present

## 2021-10-28 DIAGNOSIS — Z96641 Presence of right artificial hip joint: Secondary | ICD-10-CM | POA: Diagnosis not present

## 2021-10-28 DIAGNOSIS — D696 Thrombocytopenia, unspecified: Secondary | ICD-10-CM | POA: Diagnosis not present

## 2021-10-28 DIAGNOSIS — E1122 Type 2 diabetes mellitus with diabetic chronic kidney disease: Secondary | ICD-10-CM | POA: Diagnosis not present

## 2021-10-28 DIAGNOSIS — Z7984 Long term (current) use of oral hypoglycemic drugs: Secondary | ICD-10-CM | POA: Diagnosis not present

## 2021-10-29 ENCOUNTER — Other Ambulatory Visit: Payer: Self-pay | Admitting: Internal Medicine

## 2021-10-29 DIAGNOSIS — I10 Essential (primary) hypertension: Secondary | ICD-10-CM

## 2021-10-29 NOTE — Telephone Encounter (Signed)
Requested Prescriptions  Pending Prescriptions Disp Refills  . atenolol (TENORMIN) 50 MG tablet [Pharmacy Med Name: ATENOLOL TAB '50MG'$ ] 90 tablet 1    Sig: TAKE 1 TABLET AT BEDTIME     Cardiovascular: Beta Blockers 2 Failed - 10/29/2021 10:53 AM      Failed - Cr in normal range and within 360 days    Creatinine, Ser  Date Value Ref Range Status  10/07/2021 1.33 (H) 0.76 - 1.27 mg/dL Final         Passed - Last BP in normal range    BP Readings from Last 1 Encounters:  10/22/21 128/66         Passed - Last Heart Rate in normal range    Pulse Readings from Last 1 Encounters:  10/22/21 61         Passed - Valid encounter within last 6 months    Recent Outpatient Visits          3 weeks ago Type II diabetes mellitus with complication ([place])   [place] [facility] at [facility], Jesse Sans, MD   4 months ago Essential (primary) hypertension   [place] [facility] Care and [facility] Medicine at [facility], Jesse Sans, MD   8 months ago Anemia, unspecified type   [facility] Primary Care and Sports Medicine at [facility], Jesse Sans, MD   8 months ago Annual physical exam   [facility] Primary Care and Sports Medicine at [facility], Jesse Sans, MD   1 year ago Essential (primary) hypertension    Primary Care and Sports Medicine at [facility], Jesse Sans, MD      Future Appointments            In 3 months [name], Jesse Sans, MD [place] [facility] Care and Sports Medicine at [facility]

## 2021-10-30 DIAGNOSIS — N183 Chronic kidney disease, stage 3 unspecified: Secondary | ICD-10-CM | POA: Diagnosis not present

## 2021-10-30 DIAGNOSIS — I129 Hypertensive chronic kidney disease with stage 1 through stage 4 chronic kidney disease, or unspecified chronic kidney disease: Secondary | ICD-10-CM | POA: Diagnosis not present

## 2021-10-30 DIAGNOSIS — Z85828 Personal history of other malignant neoplasm of skin: Secondary | ICD-10-CM | POA: Diagnosis not present

## 2021-10-30 DIAGNOSIS — D696 Thrombocytopenia, unspecified: Secondary | ICD-10-CM | POA: Diagnosis not present

## 2021-10-30 DIAGNOSIS — N320 Bladder-neck obstruction: Secondary | ICD-10-CM | POA: Diagnosis not present

## 2021-10-30 DIAGNOSIS — I2510 Atherosclerotic heart disease of native coronary artery without angina pectoris: Secondary | ICD-10-CM | POA: Diagnosis not present

## 2021-10-30 DIAGNOSIS — K219 Gastro-esophageal reflux disease without esophagitis: Secondary | ICD-10-CM | POA: Diagnosis not present

## 2021-10-30 DIAGNOSIS — M5136 Other intervertebral disc degeneration, lumbar region: Secondary | ICD-10-CM | POA: Diagnosis not present

## 2021-10-30 DIAGNOSIS — I6523 Occlusion and stenosis of bilateral carotid arteries: Secondary | ICD-10-CM | POA: Diagnosis not present

## 2021-10-30 DIAGNOSIS — Z471 Aftercare following joint replacement surgery: Secondary | ICD-10-CM | POA: Diagnosis not present

## 2021-10-30 DIAGNOSIS — Z96651 Presence of right artificial knee joint: Secondary | ICD-10-CM | POA: Diagnosis not present

## 2021-10-30 DIAGNOSIS — H8110 Benign paroxysmal vertigo, unspecified ear: Secondary | ICD-10-CM | POA: Diagnosis not present

## 2021-10-30 DIAGNOSIS — I4819 Other persistent atrial fibrillation: Secondary | ICD-10-CM | POA: Diagnosis not present

## 2021-10-30 DIAGNOSIS — Z9181 History of falling: Secondary | ICD-10-CM | POA: Diagnosis not present

## 2021-10-30 DIAGNOSIS — Z7984 Long term (current) use of oral hypoglycemic drugs: Secondary | ICD-10-CM | POA: Diagnosis not present

## 2021-10-30 DIAGNOSIS — Z7901 Long term (current) use of anticoagulants: Secondary | ICD-10-CM | POA: Diagnosis not present

## 2021-10-30 DIAGNOSIS — D631 Anemia in chronic kidney disease: Secondary | ICD-10-CM | POA: Diagnosis not present

## 2021-10-30 DIAGNOSIS — E1122 Type 2 diabetes mellitus with diabetic chronic kidney disease: Secondary | ICD-10-CM | POA: Diagnosis not present

## 2021-10-30 DIAGNOSIS — Z96641 Presence of right artificial hip joint: Secondary | ICD-10-CM | POA: Diagnosis not present

## 2021-10-30 DIAGNOSIS — E785 Hyperlipidemia, unspecified: Secondary | ICD-10-CM | POA: Diagnosis not present

## 2021-10-30 DIAGNOSIS — E1121 Type 2 diabetes mellitus with diabetic nephropathy: Secondary | ICD-10-CM | POA: Diagnosis not present

## 2021-11-01 DIAGNOSIS — Z7984 Long term (current) use of oral hypoglycemic drugs: Secondary | ICD-10-CM | POA: Diagnosis not present

## 2021-11-01 DIAGNOSIS — N320 Bladder-neck obstruction: Secondary | ICD-10-CM | POA: Diagnosis not present

## 2021-11-01 DIAGNOSIS — Z85828 Personal history of other malignant neoplasm of skin: Secondary | ICD-10-CM | POA: Diagnosis not present

## 2021-11-01 DIAGNOSIS — E1121 Type 2 diabetes mellitus with diabetic nephropathy: Secondary | ICD-10-CM | POA: Diagnosis not present

## 2021-11-01 DIAGNOSIS — E1122 Type 2 diabetes mellitus with diabetic chronic kidney disease: Secondary | ICD-10-CM | POA: Diagnosis not present

## 2021-11-01 DIAGNOSIS — E785 Hyperlipidemia, unspecified: Secondary | ICD-10-CM | POA: Diagnosis not present

## 2021-11-01 DIAGNOSIS — Z471 Aftercare following joint replacement surgery: Secondary | ICD-10-CM | POA: Diagnosis not present

## 2021-11-01 DIAGNOSIS — D631 Anemia in chronic kidney disease: Secondary | ICD-10-CM | POA: Diagnosis not present

## 2021-11-01 DIAGNOSIS — K219 Gastro-esophageal reflux disease without esophagitis: Secondary | ICD-10-CM | POA: Diagnosis not present

## 2021-11-01 DIAGNOSIS — M5136 Other intervertebral disc degeneration, lumbar region: Secondary | ICD-10-CM | POA: Diagnosis not present

## 2021-11-01 DIAGNOSIS — I6523 Occlusion and stenosis of bilateral carotid arteries: Secondary | ICD-10-CM | POA: Diagnosis not present

## 2021-11-01 DIAGNOSIS — Z96641 Presence of right artificial hip joint: Secondary | ICD-10-CM | POA: Diagnosis not present

## 2021-11-01 DIAGNOSIS — Z7901 Long term (current) use of anticoagulants: Secondary | ICD-10-CM | POA: Diagnosis not present

## 2021-11-01 DIAGNOSIS — Z96651 Presence of right artificial knee joint: Secondary | ICD-10-CM | POA: Diagnosis not present

## 2021-11-01 DIAGNOSIS — I129 Hypertensive chronic kidney disease with stage 1 through stage 4 chronic kidney disease, or unspecified chronic kidney disease: Secondary | ICD-10-CM | POA: Diagnosis not present

## 2021-11-01 DIAGNOSIS — D696 Thrombocytopenia, unspecified: Secondary | ICD-10-CM | POA: Diagnosis not present

## 2021-11-01 DIAGNOSIS — I2510 Atherosclerotic heart disease of native coronary artery without angina pectoris: Secondary | ICD-10-CM | POA: Diagnosis not present

## 2021-11-01 DIAGNOSIS — N183 Chronic kidney disease, stage 3 unspecified: Secondary | ICD-10-CM | POA: Diagnosis not present

## 2021-11-01 DIAGNOSIS — I4819 Other persistent atrial fibrillation: Secondary | ICD-10-CM | POA: Diagnosis not present

## 2021-11-01 DIAGNOSIS — H8110 Benign paroxysmal vertigo, unspecified ear: Secondary | ICD-10-CM | POA: Diagnosis not present

## 2021-11-01 DIAGNOSIS — Z9181 History of falling: Secondary | ICD-10-CM | POA: Diagnosis not present

## 2021-11-04 DIAGNOSIS — M5136 Other intervertebral disc degeneration, lumbar region: Secondary | ICD-10-CM | POA: Diagnosis not present

## 2021-11-04 DIAGNOSIS — N320 Bladder-neck obstruction: Secondary | ICD-10-CM | POA: Diagnosis not present

## 2021-11-04 DIAGNOSIS — Z9181 History of falling: Secondary | ICD-10-CM | POA: Diagnosis not present

## 2021-11-04 DIAGNOSIS — E1122 Type 2 diabetes mellitus with diabetic chronic kidney disease: Secondary | ICD-10-CM | POA: Diagnosis not present

## 2021-11-04 DIAGNOSIS — Z96641 Presence of right artificial hip joint: Secondary | ICD-10-CM | POA: Diagnosis not present

## 2021-11-04 DIAGNOSIS — I129 Hypertensive chronic kidney disease with stage 1 through stage 4 chronic kidney disease, or unspecified chronic kidney disease: Secondary | ICD-10-CM | POA: Diagnosis not present

## 2021-11-04 DIAGNOSIS — N183 Chronic kidney disease, stage 3 unspecified: Secondary | ICD-10-CM | POA: Diagnosis not present

## 2021-11-04 DIAGNOSIS — D631 Anemia in chronic kidney disease: Secondary | ICD-10-CM | POA: Diagnosis not present

## 2021-11-04 DIAGNOSIS — D696 Thrombocytopenia, unspecified: Secondary | ICD-10-CM | POA: Diagnosis not present

## 2021-11-04 DIAGNOSIS — H8110 Benign paroxysmal vertigo, unspecified ear: Secondary | ICD-10-CM | POA: Diagnosis not present

## 2021-11-04 DIAGNOSIS — I2510 Atherosclerotic heart disease of native coronary artery without angina pectoris: Secondary | ICD-10-CM | POA: Diagnosis not present

## 2021-11-04 DIAGNOSIS — Z471 Aftercare following joint replacement surgery: Secondary | ICD-10-CM | POA: Diagnosis not present

## 2021-11-04 DIAGNOSIS — Z7901 Long term (current) use of anticoagulants: Secondary | ICD-10-CM | POA: Diagnosis not present

## 2021-11-04 DIAGNOSIS — Z96651 Presence of right artificial knee joint: Secondary | ICD-10-CM | POA: Diagnosis not present

## 2021-11-04 DIAGNOSIS — E785 Hyperlipidemia, unspecified: Secondary | ICD-10-CM | POA: Diagnosis not present

## 2021-11-04 DIAGNOSIS — Z7984 Long term (current) use of oral hypoglycemic drugs: Secondary | ICD-10-CM | POA: Diagnosis not present

## 2021-11-04 DIAGNOSIS — E1121 Type 2 diabetes mellitus with diabetic nephropathy: Secondary | ICD-10-CM | POA: Diagnosis not present

## 2021-11-04 DIAGNOSIS — Z85828 Personal history of other malignant neoplasm of skin: Secondary | ICD-10-CM | POA: Diagnosis not present

## 2021-11-04 DIAGNOSIS — I4819 Other persistent atrial fibrillation: Secondary | ICD-10-CM | POA: Diagnosis not present

## 2021-11-04 DIAGNOSIS — I6523 Occlusion and stenosis of bilateral carotid arteries: Secondary | ICD-10-CM | POA: Diagnosis not present

## 2021-11-04 DIAGNOSIS — K219 Gastro-esophageal reflux disease without esophagitis: Secondary | ICD-10-CM | POA: Diagnosis not present

## 2021-11-05 DIAGNOSIS — I6523 Occlusion and stenosis of bilateral carotid arteries: Secondary | ICD-10-CM | POA: Diagnosis not present

## 2021-11-05 DIAGNOSIS — I4819 Other persistent atrial fibrillation: Secondary | ICD-10-CM | POA: Diagnosis not present

## 2021-11-05 DIAGNOSIS — I2510 Atherosclerotic heart disease of native coronary artery without angina pectoris: Secondary | ICD-10-CM | POA: Diagnosis not present

## 2021-11-05 DIAGNOSIS — N183 Chronic kidney disease, stage 3 unspecified: Secondary | ICD-10-CM | POA: Diagnosis not present

## 2021-11-05 DIAGNOSIS — D631 Anemia in chronic kidney disease: Secondary | ICD-10-CM | POA: Diagnosis not present

## 2021-11-05 DIAGNOSIS — E1122 Type 2 diabetes mellitus with diabetic chronic kidney disease: Secondary | ICD-10-CM | POA: Diagnosis not present

## 2021-11-05 DIAGNOSIS — Z96651 Presence of right artificial knee joint: Secondary | ICD-10-CM | POA: Diagnosis not present

## 2021-11-05 DIAGNOSIS — N320 Bladder-neck obstruction: Secondary | ICD-10-CM | POA: Diagnosis not present

## 2021-11-05 DIAGNOSIS — Z7984 Long term (current) use of oral hypoglycemic drugs: Secondary | ICD-10-CM | POA: Diagnosis not present

## 2021-11-05 DIAGNOSIS — M5136 Other intervertebral disc degeneration, lumbar region: Secondary | ICD-10-CM | POA: Diagnosis not present

## 2021-11-05 DIAGNOSIS — Z96641 Presence of right artificial hip joint: Secondary | ICD-10-CM | POA: Diagnosis not present

## 2021-11-05 DIAGNOSIS — Z85828 Personal history of other malignant neoplasm of skin: Secondary | ICD-10-CM | POA: Diagnosis not present

## 2021-11-05 DIAGNOSIS — Z471 Aftercare following joint replacement surgery: Secondary | ICD-10-CM | POA: Diagnosis not present

## 2021-11-05 DIAGNOSIS — H8110 Benign paroxysmal vertigo, unspecified ear: Secondary | ICD-10-CM | POA: Diagnosis not present

## 2021-11-05 DIAGNOSIS — D696 Thrombocytopenia, unspecified: Secondary | ICD-10-CM | POA: Diagnosis not present

## 2021-11-05 DIAGNOSIS — K219 Gastro-esophageal reflux disease without esophagitis: Secondary | ICD-10-CM | POA: Diagnosis not present

## 2021-11-05 DIAGNOSIS — E1121 Type 2 diabetes mellitus with diabetic nephropathy: Secondary | ICD-10-CM | POA: Diagnosis not present

## 2021-11-05 DIAGNOSIS — Z9181 History of falling: Secondary | ICD-10-CM | POA: Diagnosis not present

## 2021-11-05 DIAGNOSIS — Z7901 Long term (current) use of anticoagulants: Secondary | ICD-10-CM | POA: Diagnosis not present

## 2021-11-05 DIAGNOSIS — I129 Hypertensive chronic kidney disease with stage 1 through stage 4 chronic kidney disease, or unspecified chronic kidney disease: Secondary | ICD-10-CM | POA: Diagnosis not present

## 2021-11-05 DIAGNOSIS — E785 Hyperlipidemia, unspecified: Secondary | ICD-10-CM | POA: Diagnosis not present

## 2021-11-06 DIAGNOSIS — I2510 Atherosclerotic heart disease of native coronary artery without angina pectoris: Secondary | ICD-10-CM | POA: Diagnosis not present

## 2021-11-06 DIAGNOSIS — Z96651 Presence of right artificial knee joint: Secondary | ICD-10-CM | POA: Diagnosis not present

## 2021-11-06 DIAGNOSIS — I6523 Occlusion and stenosis of bilateral carotid arteries: Secondary | ICD-10-CM | POA: Diagnosis not present

## 2021-11-06 DIAGNOSIS — H8110 Benign paroxysmal vertigo, unspecified ear: Secondary | ICD-10-CM | POA: Diagnosis not present

## 2021-11-06 DIAGNOSIS — E1122 Type 2 diabetes mellitus with diabetic chronic kidney disease: Secondary | ICD-10-CM | POA: Diagnosis not present

## 2021-11-06 DIAGNOSIS — Z7901 Long term (current) use of anticoagulants: Secondary | ICD-10-CM | POA: Diagnosis not present

## 2021-11-06 DIAGNOSIS — Z96641 Presence of right artificial hip joint: Secondary | ICD-10-CM | POA: Diagnosis not present

## 2021-11-06 DIAGNOSIS — D696 Thrombocytopenia, unspecified: Secondary | ICD-10-CM | POA: Diagnosis not present

## 2021-11-06 DIAGNOSIS — I4819 Other persistent atrial fibrillation: Secondary | ICD-10-CM | POA: Diagnosis not present

## 2021-11-06 DIAGNOSIS — E1121 Type 2 diabetes mellitus with diabetic nephropathy: Secondary | ICD-10-CM | POA: Diagnosis not present

## 2021-11-06 DIAGNOSIS — N320 Bladder-neck obstruction: Secondary | ICD-10-CM | POA: Diagnosis not present

## 2021-11-06 DIAGNOSIS — E785 Hyperlipidemia, unspecified: Secondary | ICD-10-CM | POA: Diagnosis not present

## 2021-11-06 DIAGNOSIS — M5136 Other intervertebral disc degeneration, lumbar region: Secondary | ICD-10-CM | POA: Diagnosis not present

## 2021-11-06 DIAGNOSIS — I129 Hypertensive chronic kidney disease with stage 1 through stage 4 chronic kidney disease, or unspecified chronic kidney disease: Secondary | ICD-10-CM | POA: Diagnosis not present

## 2021-11-06 DIAGNOSIS — K219 Gastro-esophageal reflux disease without esophagitis: Secondary | ICD-10-CM | POA: Diagnosis not present

## 2021-11-06 DIAGNOSIS — N183 Chronic kidney disease, stage 3 unspecified: Secondary | ICD-10-CM | POA: Diagnosis not present

## 2021-11-06 DIAGNOSIS — Z7984 Long term (current) use of oral hypoglycemic drugs: Secondary | ICD-10-CM | POA: Diagnosis not present

## 2021-11-06 DIAGNOSIS — Z471 Aftercare following joint replacement surgery: Secondary | ICD-10-CM | POA: Diagnosis not present

## 2021-11-06 DIAGNOSIS — D631 Anemia in chronic kidney disease: Secondary | ICD-10-CM | POA: Diagnosis not present

## 2021-11-06 DIAGNOSIS — Z9181 History of falling: Secondary | ICD-10-CM | POA: Diagnosis not present

## 2021-11-06 DIAGNOSIS — Z85828 Personal history of other malignant neoplasm of skin: Secondary | ICD-10-CM | POA: Diagnosis not present

## 2021-11-08 DIAGNOSIS — H8110 Benign paroxysmal vertigo, unspecified ear: Secondary | ICD-10-CM | POA: Diagnosis not present

## 2021-11-08 DIAGNOSIS — N183 Chronic kidney disease, stage 3 unspecified: Secondary | ICD-10-CM | POA: Diagnosis not present

## 2021-11-08 DIAGNOSIS — E785 Hyperlipidemia, unspecified: Secondary | ICD-10-CM | POA: Diagnosis not present

## 2021-11-08 DIAGNOSIS — Z85828 Personal history of other malignant neoplasm of skin: Secondary | ICD-10-CM | POA: Diagnosis not present

## 2021-11-08 DIAGNOSIS — N320 Bladder-neck obstruction: Secondary | ICD-10-CM | POA: Diagnosis not present

## 2021-11-08 DIAGNOSIS — Z96651 Presence of right artificial knee joint: Secondary | ICD-10-CM | POA: Diagnosis not present

## 2021-11-08 DIAGNOSIS — Z7984 Long term (current) use of oral hypoglycemic drugs: Secondary | ICD-10-CM | POA: Diagnosis not present

## 2021-11-08 DIAGNOSIS — I4819 Other persistent atrial fibrillation: Secondary | ICD-10-CM | POA: Diagnosis not present

## 2021-11-08 DIAGNOSIS — K219 Gastro-esophageal reflux disease without esophagitis: Secondary | ICD-10-CM | POA: Diagnosis not present

## 2021-11-08 DIAGNOSIS — I129 Hypertensive chronic kidney disease with stage 1 through stage 4 chronic kidney disease, or unspecified chronic kidney disease: Secondary | ICD-10-CM | POA: Diagnosis not present

## 2021-11-08 DIAGNOSIS — I2510 Atherosclerotic heart disease of native coronary artery without angina pectoris: Secondary | ICD-10-CM | POA: Diagnosis not present

## 2021-11-08 DIAGNOSIS — E1121 Type 2 diabetes mellitus with diabetic nephropathy: Secondary | ICD-10-CM | POA: Diagnosis not present

## 2021-11-08 DIAGNOSIS — I6523 Occlusion and stenosis of bilateral carotid arteries: Secondary | ICD-10-CM | POA: Diagnosis not present

## 2021-11-08 DIAGNOSIS — E1122 Type 2 diabetes mellitus with diabetic chronic kidney disease: Secondary | ICD-10-CM | POA: Diagnosis not present

## 2021-11-08 DIAGNOSIS — Z7901 Long term (current) use of anticoagulants: Secondary | ICD-10-CM | POA: Diagnosis not present

## 2021-11-08 DIAGNOSIS — Z9181 History of falling: Secondary | ICD-10-CM | POA: Diagnosis not present

## 2021-11-08 DIAGNOSIS — Z471 Aftercare following joint replacement surgery: Secondary | ICD-10-CM | POA: Diagnosis not present

## 2021-11-08 DIAGNOSIS — D631 Anemia in chronic kidney disease: Secondary | ICD-10-CM | POA: Diagnosis not present

## 2021-11-08 DIAGNOSIS — D696 Thrombocytopenia, unspecified: Secondary | ICD-10-CM | POA: Diagnosis not present

## 2021-11-08 DIAGNOSIS — Z96641 Presence of right artificial hip joint: Secondary | ICD-10-CM | POA: Diagnosis not present

## 2021-11-08 DIAGNOSIS — M5136 Other intervertebral disc degeneration, lumbar region: Secondary | ICD-10-CM | POA: Diagnosis not present

## 2021-11-09 ENCOUNTER — Other Ambulatory Visit: Payer: Self-pay | Admitting: Internal Medicine

## 2021-11-09 DIAGNOSIS — E118 Type 2 diabetes mellitus with unspecified complications: Secondary | ICD-10-CM

## 2021-11-11 NOTE — Telephone Encounter (Signed)
Requested medication (s) are due for refill today: yes  Requested medication (s) are on the active medication list: yes  Last refill:  06/24/21 #135 1 RF  Future visit scheduled: yes  Notes to clinic:  Please update SIG. Per OV 10/07/21 Metformin was decreased to 1 (500 mg) tablet twice daily.    Requested Prescriptions  Pending Prescriptions Disp Refills   metFORMIN (GLUCOPHAGE-XR) 500 MG 24 hr tablet [Pharmacy Med Name: METFORMIN ER TAB 500MG GP] 135 tablet 1    Sig: TAKE 1 TABLET DAILY WITH   BREAKFAST AND 2 TABLETS    EVERY EVENING.     Endocrinology:  Diabetes - Biguanides Failed - 11/09/2021 11:59 AM      Failed - Cr in normal range and within 360 days    Creatinine, Ser  Date Value Ref Range Status  10/07/2021 1.33 (H) 0.76 - 1.27 mg/dL Final         Failed - eGFR in normal range and within 360 days    GFR calc Af Amer  Date Value Ref Range Status  05/19/2019 55 (L) >59 mL/min/1.73 Final    Comment:    **[facility] currently reports eGFR in compliance with the current**   recommendations of the [employer]. [facility] will   update reporting as new guidelines are published from the NKF-ASN   Task force.    GFR, Estimated  Date Value Ref Range Status  04/04/2020 >60 >60 mL/min Final    Comment:    (NOTE) Calculated using the CKD-EPI Creatinine Equation (2021)    eGFR  Date Value Ref Range Status  10/07/2021 53 (L) >59 mL/min/1.73 Final         Failed - B12 Level in normal range and within 720 days    Vitamin B-12  Date Value Ref Range Status  02/28/2021 1,594 (H) 232 - 1,245 pg/mL Final         Passed - HBA1C is between 0 and 7.9 and within 180 days    Hgb A1c MFr Bld  Date Value Ref Range Status  10/07/2021 6.5 (H) 4.8 - 5.6 % Final    Comment:             Prediabetes: 5.7 - 6.4          Diabetes: >6.4          Glycemic control for adults with diabetes: <7.0          Passed - Valid encounter within last 6 months    Recent Outpatient  Visits           1 month ago Type II diabetes mellitus with complication ([place])   [place] [facility] at [facility], Jesse Sans, MD   4 months ago Essential (primary) hypertension   [place] [facility] at [facility], Jesse Sans, MD   8 months ago Anemia, unspecified type   [facility] Primary Care and Sports Medicine at [facility], Jesse Sans, MD   8 months ago Annual physical exam   [facility] Primary Care and Sports Medicine at [facility], Jesse Sans, MD   1 year ago Essential (primary) hypertension   [place] [facility] and Sports Medicine at [facility], Jesse Sans, MD       Future Appointments             In 3 months [name], Jesse Sans, MD [facility] Primary Care and Sports Medicine  at [employer], [place] - CBC within normal limits and completed in the last 12 months    WBC  Date Value Ref Range Status  10/07/2021 6.5 3.4 - 10.8 x10E3/uL Final  04/04/2020 5.4 4.0 - 10.5 K/uL Final   RBC  Date Value Ref Range Status  10/07/2021 3.97 (L) 4.14 - 5.80 x10E6/uL Final  04/04/2020 3.65 (L) 4.22 - 5.81 MIL/uL Final   Hemoglobin  Date Value Ref Range Status  10/07/2021 12.0 (L) 13.0 - 17.7 g/dL Final   Hematocrit  Date Value Ref Range Status  10/07/2021 36.0 (L) 37.5 - 51.0 % Final   MCHC  Date Value Ref Range Status  10/07/2021 33.3 31.5 - 35.7 g/dL Final  04/04/2020 34.4 30.0 - 36.0 g/dL Final   [facility]  Date Value Ref Range Status  10/07/2021 30.2 26.6 - 33.0 pg Final  04/04/2020 32.3 26.0 - 34.0 pg Final   MCV  Date Value Ref Range Status  10/07/2021 91 79 - 97 fL Final  01/28/2011 96 80 - 100 fL Final   No results found for: "PLTCOUNTKUC", "LABPLAT", "POCPLA" RDW  Date Value Ref Range Status  10/07/2021 13.3 11.6 - 15.4 % Final  01/28/2011 13.3 11.5 - 14.5 % Final

## 2021-11-11 NOTE — Telephone Encounter (Signed)
Please review.  KP

## 2021-11-15 DIAGNOSIS — K219 Gastro-esophageal reflux disease without esophagitis: Secondary | ICD-10-CM | POA: Diagnosis not present

## 2021-11-15 DIAGNOSIS — Z85828 Personal history of other malignant neoplasm of skin: Secondary | ICD-10-CM | POA: Diagnosis not present

## 2021-11-15 DIAGNOSIS — D696 Thrombocytopenia, unspecified: Secondary | ICD-10-CM | POA: Diagnosis not present

## 2021-11-15 DIAGNOSIS — I2510 Atherosclerotic heart disease of native coronary artery without angina pectoris: Secondary | ICD-10-CM | POA: Diagnosis not present

## 2021-11-15 DIAGNOSIS — Z96651 Presence of right artificial knee joint: Secondary | ICD-10-CM | POA: Diagnosis not present

## 2021-11-15 DIAGNOSIS — M5136 Other intervertebral disc degeneration, lumbar region: Secondary | ICD-10-CM | POA: Diagnosis not present

## 2021-11-15 DIAGNOSIS — N320 Bladder-neck obstruction: Secondary | ICD-10-CM | POA: Diagnosis not present

## 2021-11-15 DIAGNOSIS — Z9181 History of falling: Secondary | ICD-10-CM | POA: Diagnosis not present

## 2021-11-15 DIAGNOSIS — Z96641 Presence of right artificial hip joint: Secondary | ICD-10-CM | POA: Diagnosis not present

## 2021-11-15 DIAGNOSIS — E1122 Type 2 diabetes mellitus with diabetic chronic kidney disease: Secondary | ICD-10-CM | POA: Diagnosis not present

## 2021-11-15 DIAGNOSIS — H8110 Benign paroxysmal vertigo, unspecified ear: Secondary | ICD-10-CM | POA: Diagnosis not present

## 2021-11-15 DIAGNOSIS — Z7901 Long term (current) use of anticoagulants: Secondary | ICD-10-CM | POA: Diagnosis not present

## 2021-11-15 DIAGNOSIS — Z471 Aftercare following joint replacement surgery: Secondary | ICD-10-CM | POA: Diagnosis not present

## 2021-11-15 DIAGNOSIS — E785 Hyperlipidemia, unspecified: Secondary | ICD-10-CM | POA: Diagnosis not present

## 2021-11-15 DIAGNOSIS — I6523 Occlusion and stenosis of bilateral carotid arteries: Secondary | ICD-10-CM | POA: Diagnosis not present

## 2021-11-15 DIAGNOSIS — Z7984 Long term (current) use of oral hypoglycemic drugs: Secondary | ICD-10-CM | POA: Diagnosis not present

## 2021-11-15 DIAGNOSIS — I4819 Other persistent atrial fibrillation: Secondary | ICD-10-CM | POA: Diagnosis not present

## 2021-11-15 DIAGNOSIS — N183 Chronic kidney disease, stage 3 unspecified: Secondary | ICD-10-CM | POA: Diagnosis not present

## 2021-11-15 DIAGNOSIS — D631 Anemia in chronic kidney disease: Secondary | ICD-10-CM | POA: Diagnosis not present

## 2021-11-15 DIAGNOSIS — I129 Hypertensive chronic kidney disease with stage 1 through stage 4 chronic kidney disease, or unspecified chronic kidney disease: Secondary | ICD-10-CM | POA: Diagnosis not present

## 2021-11-15 DIAGNOSIS — E1121 Type 2 diabetes mellitus with diabetic nephropathy: Secondary | ICD-10-CM | POA: Diagnosis not present

## 2021-11-16 ENCOUNTER — Other Ambulatory Visit: Payer: Self-pay | Admitting: Internal Medicine

## 2021-11-16 DIAGNOSIS — E1169 Type 2 diabetes mellitus with other specified complication: Secondary | ICD-10-CM

## 2021-11-18 DIAGNOSIS — Z7901 Long term (current) use of anticoagulants: Secondary | ICD-10-CM | POA: Diagnosis not present

## 2021-11-18 DIAGNOSIS — N320 Bladder-neck obstruction: Secondary | ICD-10-CM | POA: Diagnosis not present

## 2021-11-18 DIAGNOSIS — E785 Hyperlipidemia, unspecified: Secondary | ICD-10-CM | POA: Diagnosis not present

## 2021-11-18 DIAGNOSIS — K219 Gastro-esophageal reflux disease without esophagitis: Secondary | ICD-10-CM | POA: Diagnosis not present

## 2021-11-18 DIAGNOSIS — I129 Hypertensive chronic kidney disease with stage 1 through stage 4 chronic kidney disease, or unspecified chronic kidney disease: Secondary | ICD-10-CM | POA: Diagnosis not present

## 2021-11-18 DIAGNOSIS — E1121 Type 2 diabetes mellitus with diabetic nephropathy: Secondary | ICD-10-CM | POA: Diagnosis not present

## 2021-11-18 DIAGNOSIS — D696 Thrombocytopenia, unspecified: Secondary | ICD-10-CM | POA: Diagnosis not present

## 2021-11-18 DIAGNOSIS — Z9181 History of falling: Secondary | ICD-10-CM | POA: Diagnosis not present

## 2021-11-18 DIAGNOSIS — I6523 Occlusion and stenosis of bilateral carotid arteries: Secondary | ICD-10-CM | POA: Diagnosis not present

## 2021-11-18 DIAGNOSIS — I4819 Other persistent atrial fibrillation: Secondary | ICD-10-CM | POA: Diagnosis not present

## 2021-11-18 DIAGNOSIS — D631 Anemia in chronic kidney disease: Secondary | ICD-10-CM | POA: Diagnosis not present

## 2021-11-18 DIAGNOSIS — Z85828 Personal history of other malignant neoplasm of skin: Secondary | ICD-10-CM | POA: Diagnosis not present

## 2021-11-18 DIAGNOSIS — H8110 Benign paroxysmal vertigo, unspecified ear: Secondary | ICD-10-CM | POA: Diagnosis not present

## 2021-11-18 DIAGNOSIS — N183 Chronic kidney disease, stage 3 unspecified: Secondary | ICD-10-CM | POA: Diagnosis not present

## 2021-11-18 DIAGNOSIS — Z96651 Presence of right artificial knee joint: Secondary | ICD-10-CM | POA: Diagnosis not present

## 2021-11-18 DIAGNOSIS — Z7984 Long term (current) use of oral hypoglycemic drugs: Secondary | ICD-10-CM | POA: Diagnosis not present

## 2021-11-18 DIAGNOSIS — Z471 Aftercare following joint replacement surgery: Secondary | ICD-10-CM | POA: Diagnosis not present

## 2021-11-18 DIAGNOSIS — M5136 Other intervertebral disc degeneration, lumbar region: Secondary | ICD-10-CM | POA: Diagnosis not present

## 2021-11-18 DIAGNOSIS — Z96641 Presence of right artificial hip joint: Secondary | ICD-10-CM | POA: Diagnosis not present

## 2021-11-18 DIAGNOSIS — I2510 Atherosclerotic heart disease of native coronary artery without angina pectoris: Secondary | ICD-10-CM | POA: Diagnosis not present

## 2021-11-18 DIAGNOSIS — E1122 Type 2 diabetes mellitus with diabetic chronic kidney disease: Secondary | ICD-10-CM | POA: Diagnosis not present

## 2021-11-18 NOTE — Telephone Encounter (Signed)
Requested Prescriptions  Pending Prescriptions Disp Refills   rosuvastatin (CRESTOR) 20 MG tablet [Pharmacy Med Name: ROSUVASTATIN TAB '20MG'$ ] 90 tablet 1    Sig: TAKE 1 TABLET AT BEDTIME     Cardiovascular:  Antilipid - Statins 2 Failed - 11/16/2021 11:07 AM      Failed - Cr in normal range and within 360 days    Creatinine, Ser  Date Value Ref Range Status  10/07/2021 1.33 (H) 0.76 - 1.27 mg/dL Final         Failed - Lipid Panel in normal range within the last 12 months    Cholesterol, Total  Date Value Ref Range Status  02/19/2021 123 100 - 199 mg/dL Final   LDL Chol Calc (NIH)  Date Value Ref Range Status  02/19/2021 61 0 - 99 mg/dL Final   HDL  Date Value Ref Range Status  02/19/2021 39 (L) >39 mg/dL Final   Triglycerides  Date Value Ref Range Status  02/19/2021 127 0 - 149 mg/dL Final         Passed - Patient is not pregnant      Passed - Valid encounter within last 12 months    Recent Outpatient Visits           1 month ago Type II diabetes mellitus with complication ([place])   [place] [facility] Care and Sports Medicine at [facility], Jesse Sans, MD   5 months ago Essential (primary) hypertension   [place] [facility] Care and Sports Medicine at [facility], Jesse Sans, MD   8 months ago Anemia, unspecified type   [facility] Primary Care and Sports Medicine at [facility], Jesse Sans, MD   9 months ago Annual physical exam   [facility] Primary Care and Sports Medicine at [facility], Jesse Sans, MD   1 year ago Essential (primary) hypertension   [place] Primary Care and Sports Medicine at [facility], Jesse Sans, MD       Future Appointments             In 3 months [name], Jesse Sans, MD [place] [facility] Care and Sports Medicine at [facility]

## 2021-11-20 DIAGNOSIS — Z4802 Encounter for removal of sutures: Secondary | ICD-10-CM | POA: Diagnosis not present

## 2021-11-20 DIAGNOSIS — C44712 Basal cell carcinoma of skin of right lower limb, including hip: Secondary | ICD-10-CM | POA: Diagnosis not present

## 2021-11-20 DIAGNOSIS — C44222 Squamous cell carcinoma of skin of right ear and external auricular canal: Secondary | ICD-10-CM | POA: Diagnosis not present

## 2021-11-20 DIAGNOSIS — C4442 Squamous cell carcinoma of skin of scalp and neck: Secondary | ICD-10-CM | POA: Diagnosis not present

## 2021-11-20 DIAGNOSIS — D485 Neoplasm of uncertain behavior of skin: Secondary | ICD-10-CM | POA: Diagnosis not present

## 2021-11-25 DIAGNOSIS — E1122 Type 2 diabetes mellitus with diabetic chronic kidney disease: Secondary | ICD-10-CM | POA: Diagnosis not present

## 2021-11-25 DIAGNOSIS — Z7901 Long term (current) use of anticoagulants: Secondary | ICD-10-CM | POA: Diagnosis not present

## 2021-11-25 DIAGNOSIS — M5136 Other intervertebral disc degeneration, lumbar region: Secondary | ICD-10-CM | POA: Diagnosis not present

## 2021-11-25 DIAGNOSIS — I129 Hypertensive chronic kidney disease with stage 1 through stage 4 chronic kidney disease, or unspecified chronic kidney disease: Secondary | ICD-10-CM | POA: Diagnosis not present

## 2021-11-25 DIAGNOSIS — Z7984 Long term (current) use of oral hypoglycemic drugs: Secondary | ICD-10-CM | POA: Diagnosis not present

## 2021-11-25 DIAGNOSIS — Z96651 Presence of right artificial knee joint: Secondary | ICD-10-CM | POA: Diagnosis not present

## 2021-11-25 DIAGNOSIS — I2510 Atherosclerotic heart disease of native coronary artery without angina pectoris: Secondary | ICD-10-CM | POA: Diagnosis not present

## 2021-11-25 DIAGNOSIS — Z85828 Personal history of other malignant neoplasm of skin: Secondary | ICD-10-CM | POA: Diagnosis not present

## 2021-11-25 DIAGNOSIS — Z9181 History of falling: Secondary | ICD-10-CM | POA: Diagnosis not present

## 2021-11-25 DIAGNOSIS — E1121 Type 2 diabetes mellitus with diabetic nephropathy: Secondary | ICD-10-CM | POA: Diagnosis not present

## 2021-11-25 DIAGNOSIS — I6523 Occlusion and stenosis of bilateral carotid arteries: Secondary | ICD-10-CM | POA: Diagnosis not present

## 2021-11-25 DIAGNOSIS — K219 Gastro-esophageal reflux disease without esophagitis: Secondary | ICD-10-CM | POA: Diagnosis not present

## 2021-11-25 DIAGNOSIS — D631 Anemia in chronic kidney disease: Secondary | ICD-10-CM | POA: Diagnosis not present

## 2021-11-25 DIAGNOSIS — H8110 Benign paroxysmal vertigo, unspecified ear: Secondary | ICD-10-CM | POA: Diagnosis not present

## 2021-11-25 DIAGNOSIS — I4819 Other persistent atrial fibrillation: Secondary | ICD-10-CM | POA: Diagnosis not present

## 2021-11-25 DIAGNOSIS — D696 Thrombocytopenia, unspecified: Secondary | ICD-10-CM | POA: Diagnosis not present

## 2021-11-25 DIAGNOSIS — E785 Hyperlipidemia, unspecified: Secondary | ICD-10-CM | POA: Diagnosis not present

## 2021-11-25 DIAGNOSIS — Z96641 Presence of right artificial hip joint: Secondary | ICD-10-CM | POA: Diagnosis not present

## 2021-11-25 DIAGNOSIS — N183 Chronic kidney disease, stage 3 unspecified: Secondary | ICD-10-CM | POA: Diagnosis not present

## 2021-11-25 DIAGNOSIS — Z471 Aftercare following joint replacement surgery: Secondary | ICD-10-CM | POA: Diagnosis not present

## 2021-11-25 DIAGNOSIS — N320 Bladder-neck obstruction: Secondary | ICD-10-CM | POA: Diagnosis not present

## 2021-12-02 DIAGNOSIS — Z9181 History of falling: Secondary | ICD-10-CM | POA: Diagnosis not present

## 2021-12-02 DIAGNOSIS — Z96641 Presence of right artificial hip joint: Secondary | ICD-10-CM | POA: Diagnosis not present

## 2021-12-02 DIAGNOSIS — Z7984 Long term (current) use of oral hypoglycemic drugs: Secondary | ICD-10-CM | POA: Diagnosis not present

## 2021-12-02 DIAGNOSIS — I6523 Occlusion and stenosis of bilateral carotid arteries: Secondary | ICD-10-CM | POA: Diagnosis not present

## 2021-12-02 DIAGNOSIS — I129 Hypertensive chronic kidney disease with stage 1 through stage 4 chronic kidney disease, or unspecified chronic kidney disease: Secondary | ICD-10-CM | POA: Diagnosis not present

## 2021-12-02 DIAGNOSIS — E1122 Type 2 diabetes mellitus with diabetic chronic kidney disease: Secondary | ICD-10-CM | POA: Diagnosis not present

## 2021-12-02 DIAGNOSIS — N183 Chronic kidney disease, stage 3 unspecified: Secondary | ICD-10-CM | POA: Diagnosis not present

## 2021-12-02 DIAGNOSIS — H8110 Benign paroxysmal vertigo, unspecified ear: Secondary | ICD-10-CM | POA: Diagnosis not present

## 2021-12-02 DIAGNOSIS — K219 Gastro-esophageal reflux disease without esophagitis: Secondary | ICD-10-CM | POA: Diagnosis not present

## 2021-12-02 DIAGNOSIS — E1121 Type 2 diabetes mellitus with diabetic nephropathy: Secondary | ICD-10-CM | POA: Diagnosis not present

## 2021-12-02 DIAGNOSIS — N320 Bladder-neck obstruction: Secondary | ICD-10-CM | POA: Diagnosis not present

## 2021-12-02 DIAGNOSIS — Z7901 Long term (current) use of anticoagulants: Secondary | ICD-10-CM | POA: Diagnosis not present

## 2021-12-02 DIAGNOSIS — D631 Anemia in chronic kidney disease: Secondary | ICD-10-CM | POA: Diagnosis not present

## 2021-12-02 DIAGNOSIS — M5136 Other intervertebral disc degeneration, lumbar region: Secondary | ICD-10-CM | POA: Diagnosis not present

## 2021-12-02 DIAGNOSIS — Z96651 Presence of right artificial knee joint: Secondary | ICD-10-CM | POA: Diagnosis not present

## 2021-12-02 DIAGNOSIS — I4819 Other persistent atrial fibrillation: Secondary | ICD-10-CM | POA: Diagnosis not present

## 2021-12-02 DIAGNOSIS — Z85828 Personal history of other malignant neoplasm of skin: Secondary | ICD-10-CM | POA: Diagnosis not present

## 2021-12-02 DIAGNOSIS — I2510 Atherosclerotic heart disease of native coronary artery without angina pectoris: Secondary | ICD-10-CM | POA: Diagnosis not present

## 2021-12-02 DIAGNOSIS — D696 Thrombocytopenia, unspecified: Secondary | ICD-10-CM | POA: Diagnosis not present

## 2021-12-02 DIAGNOSIS — E785 Hyperlipidemia, unspecified: Secondary | ICD-10-CM | POA: Diagnosis not present

## 2021-12-02 DIAGNOSIS — Z471 Aftercare following joint replacement surgery: Secondary | ICD-10-CM | POA: Diagnosis not present

## 2021-12-03 DIAGNOSIS — Z96641 Presence of right artificial hip joint: Secondary | ICD-10-CM | POA: Diagnosis not present

## 2021-12-04 DIAGNOSIS — D485 Neoplasm of uncertain behavior of skin: Secondary | ICD-10-CM | POA: Diagnosis not present

## 2021-12-04 DIAGNOSIS — C4442 Squamous cell carcinoma of skin of scalp and neck: Secondary | ICD-10-CM | POA: Diagnosis not present

## 2021-12-13 DIAGNOSIS — Z471 Aftercare following joint replacement surgery: Secondary | ICD-10-CM | POA: Diagnosis not present

## 2021-12-14 ENCOUNTER — Other Ambulatory Visit: Payer: Self-pay | Admitting: Internal Medicine

## 2021-12-14 DIAGNOSIS — N401 Enlarged prostate with lower urinary tract symptoms: Secondary | ICD-10-CM

## 2021-12-16 DIAGNOSIS — E1122 Type 2 diabetes mellitus with diabetic chronic kidney disease: Secondary | ICD-10-CM | POA: Diagnosis not present

## 2021-12-16 DIAGNOSIS — I480 Paroxysmal atrial fibrillation: Secondary | ICD-10-CM | POA: Diagnosis not present

## 2021-12-16 DIAGNOSIS — N183 Chronic kidney disease, stage 3 unspecified: Secondary | ICD-10-CM | POA: Diagnosis not present

## 2021-12-16 DIAGNOSIS — E782 Mixed hyperlipidemia: Secondary | ICD-10-CM | POA: Diagnosis not present

## 2021-12-16 DIAGNOSIS — I10 Essential (primary) hypertension: Secondary | ICD-10-CM | POA: Diagnosis not present

## 2021-12-16 NOTE — Telephone Encounter (Signed)
Requested medication (s) are due for refill today: yes  Requested medication (s) are on the active medication list: yes  Last refill:  02/19/21 #90  1 RF  Future visit scheduled: yes  Notes to clinic:  overdue PSA   Requested Prescriptions  Pending Prescriptions Disp Refills   tamsulosin (FLOMAX) 0.4 MG CAPS capsule [Pharmacy Med Name: TAMSULOSIN CAP 0.'4MG'$ ] 90 capsule 1    Sig: TAKE 1 CAPSULE DAILY AFTER SUPPER     Urology: Alpha-Adrenergic Blocker Failed - 12/14/2021 10:16 AM      Failed - PSA in normal range and within 360 days    PSA  Date Value Ref Range Status  04/10/2017 1.83  Final   Prostate Specific Ag, Serum  Date Value Ref Range Status  02/15/2020 0.7 0.0 - 4.0 ng/mL Final    Comment:    Roche ECLIA methodology. According to the American Urological Association, Serum PSA should decrease and remain at undetectable levels after radical prostatectomy. The AUA defines biochemical recurrence as an initial PSA value 0.2 ng/mL or greater followed by a subsequent confirmatory PSA value 0.2 ng/mL or greater. Values obtained with different assay methods or kits cannot be used interchangeably. Results cannot be interpreted as absolute evidence of the presence or absence of malignant disease.          Passed - Last BP in normal range    BP Readings from Last 1 Encounters:  10/22/21 128/66         Passed - Valid encounter within last 12 months    Recent Outpatient Visits           2 months ago Type II diabetes mellitus with complication [facility])   [place] [facility] Medicine at [facility], Jesse Sans, MD   5 months ago Essential (primary) hypertension   [place] [facility] Care and Sports Medicine at [facility], Jesse Sans, MD   9 months ago Anemia, unspecified type   [facility] Primary Care and Sports Medicine at [facility], Jesse Sans, MD   10 months ago Annual physical exam   [facility] Primary Care and Sports  Medicine at [facility], Jesse Sans, MD   1 year ago Essential (primary) hypertension   [place] Primary Care and Sports Medicine at [facility], Jesse Sans, MD       Future Appointments             In 2 months [name], Jesse Sans, MD [place] Primary Care and Sports Medicine at [facility]

## 2021-12-18 DIAGNOSIS — C4442 Squamous cell carcinoma of skin of scalp and neck: Secondary | ICD-10-CM | POA: Diagnosis not present

## 2021-12-25 ENCOUNTER — Telehealth: Payer: Self-pay | Admitting: *Deleted

## 2021-12-25 DIAGNOSIS — E119 Type 2 diabetes mellitus without complications: Secondary | ICD-10-CM | POA: Diagnosis not present

## 2021-12-25 DIAGNOSIS — H5213 Myopia, bilateral: Secondary | ICD-10-CM | POA: Diagnosis not present

## 2021-12-25 LAB — HM DIABETES EYE EXAM

## 2021-12-25 NOTE — Patient Outreach (Signed)
  Care Coordination   12/25/2021 Name: Maxwell Stanley MRN: [id number] DOB: 1934-03-11   Care Coordination Outreach Attempts:  An unsuccessful telephone outreach was attempted today to offer the patient information about available care coordination services as a benefit of their health plan.   Follow Up Plan:  Additional outreach attempts will be made to offer the patient care coordination information and services.   Encounter Outcome:  No Answer   Care Coordination Interventions:  No, not indicated    Valente David, RN, MSN, [facility] Care Management Care Management Coordinator [phone]

## 2021-12-26 ENCOUNTER — Encounter: Payer: Self-pay | Admitting: *Deleted

## 2021-12-26 ENCOUNTER — Telehealth: Payer: Self-pay | Admitting: *Deleted

## 2021-12-26 DIAGNOSIS — Z08 Encounter for follow-up examination after completed treatment for malignant neoplasm: Secondary | ICD-10-CM | POA: Diagnosis not present

## 2021-12-26 DIAGNOSIS — C44329 Squamous cell carcinoma of skin of other parts of face: Secondary | ICD-10-CM | POA: Diagnosis not present

## 2021-12-26 DIAGNOSIS — Z48817 Encounter for surgical aftercare following surgery on the skin and subcutaneous tissue: Secondary | ICD-10-CM | POA: Diagnosis not present

## 2021-12-26 DIAGNOSIS — C44319 Basal cell carcinoma of skin of other parts of face: Secondary | ICD-10-CM | POA: Diagnosis not present

## 2021-12-26 DIAGNOSIS — D485 Neoplasm of uncertain behavior of skin: Secondary | ICD-10-CM | POA: Diagnosis not present

## 2021-12-26 DIAGNOSIS — Z85828 Personal history of other malignant neoplasm of skin: Secondary | ICD-10-CM | POA: Diagnosis not present

## 2021-12-26 DIAGNOSIS — L304 Erythema intertrigo: Secondary | ICD-10-CM | POA: Diagnosis not present

## 2021-12-26 NOTE — Patient Outreach (Signed)
  Care Coordination   Initial Visit Note   12/26/2021 Name: Maxwell Stanley MRN: [id number] DOB: 1934/11/05  Maxwell Stanley is a 87 y.o. year old male who sees [name], Jesse Sans, MD for primary care. I spoke with  Maxwell Stanley by phone today.  What matters to the patients health and wellness today?  Has history of skin cancer, follow up with dermatology today, state he and wife are "very capable" of managing health.  He will call if he has questions, does not request follow up at this time.     Goals Addressed             This Visit's Progress    COMPLETED: Care coordination activities - No follow up needed       Care Coordination Interventions: Evaluation of current treatment plan related to chronic medical conditions (hip surgery, DM, and HTN) and patient's adherence to plan as established by provider Advised patient to Continue exercising (walking and riding stationary bike) Reviewed medications with patient and discussed adherence and affordability Reviewed scheduled/upcoming provider appointments including [facility] on 1/3 and PCP on 2/16 Discussed plans with patient for ongoing care management follow up and provided patient with direct contact information for care management team Screening for signs and symptoms of depression related to chronic disease state  Assessed social determinant of health barriers Monitors blood sugar on a regular basis, A1C is 6.5 Has recovered well from hip surgery on 10/16          SDOH assessments and interventions completed:  Yes  SDOH Interventions Today    Flowsheet Row Most Recent Value  SDOH Interventions   Food Insecurity Interventions Intervention Not Indicated  Housing Interventions Intervention Not Indicated  Transportation Interventions Intervention Not Indicated        Care Coordination Interventions:  Yes, provided   Follow up plan: No further intervention required.   Encounter Outcome:  Pt. Visit Completed    Valente David, RN, MSN, [place] Care Management Care Management Coordinator [phone]

## 2021-12-26 NOTE — Patient Instructions (Signed)
Visit Information  Thank you for taking time to visit with me today. Please don't hesitate to contact me if I can be of assistance to you.  Following are the goals we discussed today:  Continue exercises. Continue monitoring blood sugar daily.   Please call the Suicide and Crisis Lifeline: 988 call the [place] National Suicide Prevention Lifeline: 9716334119 or TTY: 769 066 7205 TTY 240 731 1231) to talk to a trained counselor call 1-800-273-TALK (toll free, 24 hour hotline) call 911 if you are experiencing a Mental Health or [place] or need someone to talk to.  Patient verbalizes understanding of instructions and care plan provided today and agrees to view in [place]. Active MyChart status and patient understanding of how to access instructions and care plan via MyChart confirmed with patient.     The patient has been provided with contact information for the care management team and has been advised to call with any health related questions or concerns.   Valente David, RN, MSN, [place] Care Management Care Management Coordinator [phone]

## 2022-01-01 ENCOUNTER — Other Ambulatory Visit: Payer: Self-pay | Admitting: Internal Medicine

## 2022-01-01 DIAGNOSIS — K2100 Gastro-esophageal reflux disease with esophagitis, without bleeding: Secondary | ICD-10-CM

## 2022-01-08 ENCOUNTER — Ambulatory Visit: Payer: Medicare HMO

## 2022-01-08 DIAGNOSIS — C44222 Squamous cell carcinoma of skin of right ear and external auricular canal: Secondary | ICD-10-CM | POA: Diagnosis not present

## 2022-01-08 DIAGNOSIS — L989 Disorder of the skin and subcutaneous tissue, unspecified: Secondary | ICD-10-CM | POA: Diagnosis not present

## 2022-01-08 DIAGNOSIS — Z48817 Encounter for surgical aftercare following surgery on the skin and subcutaneous tissue: Secondary | ICD-10-CM | POA: Diagnosis not present

## 2022-01-10 ENCOUNTER — Ambulatory Visit (INDEPENDENT_AMBULATORY_CARE_PROVIDER_SITE_OTHER): Payer: Medicare HMO

## 2022-01-10 DIAGNOSIS — Z0000 Encounter for general adult medical examination without abnormal findings: Secondary | ICD-10-CM | POA: Diagnosis not present

## 2022-01-10 NOTE — Progress Notes (Addendum)
I connected with  [name] on 01/10/22 by a audio enabled telemedicine application and verified that I am speaking with the correct person using two identifiers.  Patient Location: Home  Provider Location: Office/Clinic  I discussed the limitations of evaluation and management by telemedicine. The patient expressed understanding and agreed to proceed.  Subjective:   Maxwell Stanley is a 88 y.o. male who presents for Medicare Annual/Subsequent preventive examination.  Review of Systems    Per HPI unless specifically indicated below.  Cardiac Risk Factors include: advanced age (>81mn, >>1women);male gender          Objective:       10/22/2021    1:00 PM 10/22/2021    7:24 AM 10/22/2021    3:59 AM  Vitals with BMI  Systolic [id number] Diastolic 66 75 74  Pulse  61 63    Today's Vitals   01/10/22 1009  PainSc: 2    There is no height or weight on file to calculate BMI.     10/21/2021    6:40 AM 10/09/2021    1:58 PM 05/28/2021    9:30 AM 01/02/2021   10:57 AM 04/16/2020    4:17 PM 04/04/2020    9:16 AM 03/08/2020    7:55 AM  Advanced Directives  Does Patient Have a Medical Advance Directive? Yes Yes Yes Yes Yes Yes Yes  Type of AParamedicof [place]osewood HeightsLiving will  HBaldwinLiving will HPetalumaLiving will HDecloLiving will HLowryLiving will HCenterportLiving will  Does patient want to make changes to medical advance directive? No - Patient declined    No - Patient declined  No - Patient declined  Copy of [place] Chart? Yes - validated most recent copy scanned in chart (See row information)   No - copy requested No - copy requested No - copy requested No - copy requested    Current Medications (verified) Outpatient Encounter Medications as of 01/10/2022  Medication Sig   acetaminophen (TYLENOL) 500 MG  tablet Take 1,000 mg by mouth 2 (two) times daily.   amLODipine (NORVASC) 2.5 MG tablet TAKE 1 TABLET DAILY   aspirin EC 81 MG tablet Take 81 mg by mouth daily. Swallow whole.   atenolol (TENORMIN) 50 MG tablet TAKE 1 TABLET AT BEDTIME   cholecalciferol (VITAMIN D) 1000 UNITS tablet Take 1,000 Units by mouth daily.   clopidogrel (PLAVIX) 75 MG tablet Take 75 mg by mouth daily.   docusate sodium (COLACE) 100 MG capsule Take 200 mg by mouth daily.    ferrous sulfate 325 (65 FE) MG EC tablet Take 1 tablet by mouth daily.   finasteride (PROSCAR) 5 MG tablet TAKE 1 TABLET DAILY   fluticasone (FLONASE) 50 MCG/ACT nasal spray Place 2 sprays into both nostrils daily. (Patient taking differently: Place 2 sprays into both nostrils as needed for allergies.)   glucose blood (ONETOUCH ULTRA) test strip Use as instructed   losartan (COZAAR) 100 MG tablet TAKE 1/2 TABLET TWICE A DAY (Patient taking differently: Take 50 mg by mouth 2 (two) times daily.)   Melatonin 10 MG CAPS Take 10 mg by mouth at bedtime.   metFORMIN (GLUCOPHAGE-XR) 500 MG 24 hr tablet Take 1 tablet (500 mg total) by mouth 2 (two) times daily with a meal. (Patient taking differently: Take 500 mg by mouth daily with breakfast.)   Misc Natural Products (  OSTEO BI-FLEX TRIPLE STRENGTH PO) Take 2 tablets by mouth daily at 6 (six) AM.   OneTouch Delica Lancets 80D MISC 1 each by Does not apply route daily. Use to test blood sugar once daily.   pantoprazole (PROTONIX) 40 MG tablet Take 1 tablet (40 mg total) by mouth 2 (two) times daily.   Probiotic Product (PROBIOTIC DAILY PO) Take 1 tablet by mouth daily.   rosuvastatin (CRESTOR) 20 MG tablet TAKE 1 TABLET AT BEDTIME   sitaGLIPtin (JANUVIA) 100 MG tablet Take 1 tablet (100 mg total) by mouth daily. (Patient taking differently: Take 50 mg by mouth every morning.)   tacrolimus (PROTOPIC) 0.1 % ointment Apply topically 2 (two) times daily.   tamsulosin (FLOMAX) 0.4 MG CAPS capsule TAKE 1 CAPSULE  DAILY AFTER SUPPER   meloxicam (MOBIC) 7.5 MG tablet Take 7.5 mg by mouth 2 (two) times daily. (Patient not taking: Reported on 01/10/2022)   Multiple Vitamin (MULTIVITAMIN WITH MINERALS) TABS tablet Take 1 tablet by mouth daily. (Patient not taking: Reported on 01/10/2022)   scopolamine (TRANSDERM SCOP, 1.5 MG,) 1 MG/3DAYS Place 1 patch onto the skin See admin instructions. PRN for deep see fishing (Patient not taking: Reported on 01/10/2022)   traMADol (ULTRAM) 50 MG tablet Take 1 tablet (50 mg total) by mouth every 4 (four) hours as needed for moderate pain. (Patient not taking: Reported on 01/10/2022)   [DISCONTINUED] apixaban (ELIQUIS) 2.5 MG TABS tablet Take 2.5 mg by mouth 2 (two) times daily. (Patient not taking: Reported on 01/10/2022)   [DISCONTINUED] mupirocin ointment (BACTROBAN) 2 % APPLY TO AFFECTED AREA EVERY DAY (Patient not taking: Reported on 01/10/2022)   No facility-administered encounter medications on file as of 01/10/2022.    Allergies (verified) Codeine, Ceftriaxone, and Penicillin g   History: Past Medical History:  Diagnosis Date   A-fib ([place])    a.) CHA2DS2VASc = 5 (age x2, HTN, vascular disease history, T2DM);  b.) s/p Watchman 08/21/2021; c.) rate/rhythm maintained on oral atenolol; chronically anticoagulated with reduced dose apixaban with plans to transition to DAPT (ASA + clopidogrel) x 4 months following THA on 10/21/2021   Allergy    Aortic atherosclerosis ([place])    Basal cell carcinoma 11/28/2019   R neck post auricular, EDC  NODULAR AND INFILTRATIVE PATTERNS   Basal cell carcinoma 12/21/2019   R lateral calf, treated wiht EDC 01/26/2020   BCC (basal cell carcinoma) 06/19/2020   left distal calf, EDC 08/09/2020   Bilateral inguinal hernia    a.) s/p repair   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    Carpal tunnel syndrome on left    Cataract 11/2018   Chronic back pain    CKD stage 3 secondary to diabetes ([place])    DDD (degenerative disc disease),  lumbar    Diastolic dysfunction 98/33/8250   a.) TTE 11/19/2020: EF >55%, mild LVH, mod BAE, mild RVE, triv AR/PR, mod MR/TR, G1DD   Elevated PSA    GERD (gastroesophageal reflux disease)    History of basal cell carcinoma (BCC) 08/21/2020   right chest pectoral tx'd with Moh's 08/21/2020   History of squamous cell carcinoma of skin 2021   left ear/Moh's   Hyperlipidemia    Hypertension    IDA (iron deficiency anemia)    Leg varices    Lumbar spinal stenosis    Nocturia    Organic impotence    Osteoarthritis    PONV (postoperative nausea and vomiting)    Presence of Watchman left atrial appendage closure  device 08/21/2021   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   left proximal calf, EDC 08/09/2020   Squamous cell carcinoma of skin 11/28/2019   L ear sup helix  WELL DIFFERENTIATED   T2DM (type 2 diabetes mellitus) ([place])    Thrombophilia (HCC)    Type 2 DM with CKD stage 3 and hypertension ([place])    Umbilical hernia    a.) s/p repair   Past Surgical History:  Procedure Laterality Date   ANKLE ARTHROSCOPY WITH OPEN REDUCTION INTERNAL FIXATION (ORIF) Left 1998   COLONOSCOPY     COLONOSCOPY WITH PROPOFOL N/A 05/28/2021   Procedure: COLONOSCOPY WITH PROPOFOL;  Surgeon: Lucilla Lame, MD;  Location: [facility] ENDOSCOPY;  Service: Endoscopy;  Laterality: N/A;   ESOPHAGOGASTRODUODENOSCOPY (EGD) WITH PROPOFOL N/A 05/28/2021   Procedure: ESOPHAGOGASTRODUODENOSCOPY (EGD) WITH PROPOFOL;  Surgeon: Lucilla Lame, MD;  Location: [facility] ENDOSCOPY;  Service: Endoscopy;  Laterality: N/A;   ETHMOIDECTOMY Left 03/08/2020   Procedure: TOTAL ETHMOIDECTOMY;  Surgeon: Margaretha Sheffield, MD;  Location: [place];  Service: ENT;  Laterality: Left;   FRONTAL SINUS EXPLORATION Left 03/08/2020   Procedure: FRONTAL SINUS EXPLORATION;  Surgeon: Margaretha Sheffield, MD;  Location: [place];  Service: ENT;  Laterality: Left;   GIVENS CAPSULE STUDY N/A 07/11/2021   Procedure: GIVENS CAPSULE STUDY;   Surgeon: Lucilla Lame, MD;  Location[facility] END[facility]COPY;  Service: Endoscopy;  Laterality: N/A;   HEMORRHOID SURGERY     IMAGE GUIDED SINUS SURGERY N/A 03/08/2020   Procedure: IMAGE GUIDED SINUS SURGERY;  Surgeon: Margaretha Sheffield, MD;  Location: [place];  Service: ENT;  Laterality: N/A;  placed disk on OR charge nurse desk 2-21 kp PLACED 2ND DISK ON OR CHARGE NURSE DESK 02-28-20 KP 3RD DISK ON OR CHARGE NURSE DESK 2-25- [place] Right 11/12/2018   Procedure: HERNIA REPAIR INGUINAL ADULT;  Surgeon: Fredirick Maudlin, MD;  Location: [facility] ORS;  Service: General;  Laterality: Right;   INGUINAL HERNIA REPAIR Left 2012   KNEE ARTHROPLASTY Right 04/16/2020   Procedure: COMPUTER ASSISTED TOTAL KNEE ARTHROPLASTY;  Surgeon: Dereck Leep, MD;  Location: [facility] ORS;  Service: Orthopedics;  Laterality: Right;   KNEE ARTHROSCOPY Right 2006   MAXILLARY ANTROSTOMY Left 03/08/2020   Procedure: MAXILLARY ANTROSTOMY WITH TISSUE REMOVAL;  Surgeon: Margaretha Sheffield, MD;  Location: [place];  Service: ENT;  Laterality: Left;   SEPTOPLASTY N/A 03/08/2020   Procedure: SEPTOPLASTY;  Surgeon: Margaretha Sheffield, MD;  Location: [place];  Service: ENT;  Laterality: N/A;   skin cancer resection Right 04/07/2018   ON hand.   THUMB ARTHROSCOPY Right 2013   tendon flap to joint   TONSILLECTOMY     TOTAL HIP ARTHROPLASTY Right 10/21/2021   Procedure: TOTAL HIP ARTHROPLASTY;  Surgeon: Dereck Leep, MD;  Location: [facility] ORS;  Service: Orthopedics;  Laterality: Right;   UMBILICAL HERNIA REPAIR Left 2012   UPPER GI ENDOSCOPY     Family History  Problem Relation Age of Onset   Diabetes Mother    Coronary artery disease Father    Hypertension Father    Hyperlipidemia Father    Leukemia Father    Social History   Socioeconomic History   Marital status: Married    Spouse name: Vaughan Basta   Number of children: 2   Years of education: some college   Highest education level: 12th  grade  Occupational History   Occupation: Retired  Tobacco Use   Smoking status: Never   Smokeless tobacco:  Never  Vaping Use   Vaping Use: Never used  Substance and Sexual Activity   Alcohol use: No    Alcohol/week: 0.0 standard drinks of alcohol   Drug use: No   Sexual activity: Not Currently    Birth control/protection: None  Other Topics Concern   Not on file  Social History Narrative   Caffeine in moderation. Pt stays active working on [employer] and mows 3 yards per week   Social Determinants of Health   Financial Resource Strain: Low Risk  (01/10/2022)   Overall Financial Resource Strain (CARDIA)    Difficulty of Paying Living Expenses: Not hard at all  Food Insecurity: No Food Insecurity (01/10/2022)   Hunger Vital Sign    Worried About Running Out of Food in the Last Year: Never true    Ran Out of Food in the Last Year: Never true  Transportation Needs: No Transportation Needs (01/10/2022)   PRAPARE - Hydrologist (Medical): No    Lack of Transportation (Non-Medical): No  Physical Activity: Insufficiently Active (01/10/2022)   Exercise Vital Sign    Days of Exercise per Week: 4 days    Minutes of Exercise per Session: 30 min  Stress: No Stress Concern Present (01/10/2022)   [place]    Feeling of Stress : Not at all  Social Connections: [place] (01/10/2022)   Social Connection and Isolation Panel [NHANES]    Frequency of Communication with Friends and Family: More than three times a week    Frequency of Social Gatherings with Friends and Family: Once a week    Attends Religious Services: More than 4 times per year    Active Member of [employer] or Organizations: Yes    Attends Music therapist: More than 4 times per year    Marital Status: Married    Tobacco Counseling Counseling given: Not Answered   Clinical Intake:  Pre-visit preparation  completed: No  Pain : 0-10 Pain Score: 2  Pain Type: Chronic pain Pain Location: Ear Pain Orientation: Right Pain Descriptors / Indicators: Aching Pain Onset: More than a month ago Pain Frequency: Intermittent     Nutritional Status: BMI of 19-24  Normal Nutritional Risks: Unintentional weight gain (40lbs in the last yr) Diabetes: Yes CBG done?: Yes CBG resulted in Enter/ Edit results?: Yes Did pt. bring in CBG monitor from home?: Yes Glucose Meter Downloaded?: Yes  How often do you need to have someone help you when you read instructions, pamphlets, or other written materials from your doctor or pharmacy?: 1 - Never  Diabetic?Nutrition Risk Assessment:  Has the patient had any N/V/D within the last 2 months?  No  Does the patient have any non-healing wounds?  No  Has the patient had any unintentional weight loss or weight gain?  Yes   Diabetes:  Is the patient diabetic?  Yes  If diabetic, was a CBG obtained today?  Yes  Did the patient bring in their glucometer from home?  Yes  How often do you monitor your CBG's? Daily .   Financial Strains and Diabetes Management:  Are you having any financial strains with the device, your supplies or your medication? No .  Does the patient want to be seen by Chronic Care Management for management of their diabetes?  No  Would the patient like to be referred to a Nutritionist or for Diabetic Management?  No   Diabetic Exams:  Diabetic  Eye Exam: Completed 2 weeks ago, [facility]  Diabetic Foot Exam: Completed 02/19/2021       Information entered by :: Donnie [place], [place]   Activities of Daily Living    01/10/2022   10:06 AM 10/21/2021   12:00 PM  In your present state of health, do you have any difficulty performing the following activities:  Hearing? 0 0  Vision? 1 0  Comment [facility]   Difficulty concentrating or making decisions? 0 0  Walking or climbing stairs? 0 1  Dressing or bathing? 0 0  Doing  errands, shopping? 0 0    Patient Care Team: Glean Hess, MD as PCP - General (Internal Medicine) Baker Pierini, OD as Consulting Physician (Optometry) Corey Skains, MD as Consulting Physician (Cardiology) Ralene Bathe, MD (Dermatology) Sharlet Salina, MD as Referring Physician (Physical Medicine and Rehabilitation) Valente David, RN as [place] any recent Medical Services you may have received from other than [facility] providers in the past year (date may be approximate).  The patient was seen at [facility] on 10/21/21 for hx of total hip arthroplasty.     Assessment:   This is a routine wellness examination for [place].  Hearing/Vision screen No results found.  Dietary issues and exercise activities discussed: Current Exercise Habits: Home exercise routine;The patient has a physically strenuous job, but has no regular exercise apart from work., Time (Minutes): 30, Frequency (Times/Week): 4, Weekly Exercise (Minutes/Week): 120, Intensity: Mild, Exercise limited by: None identified   Goals Addressed   None    Depression Screen    01/10/2022   10:05 AM 06/20/2021    8:02 AM 02/28/2021   10:42 AM 02/19/2021    9:03 AM 01/02/2021   10:56 AM 10/09/2020    8:22 AM 06/06/2020    8:22 AM  PHQ 2/9 Scores  PHQ - 2 Score 0 0 0 0 0 0 0  PHQ- 9 Score  '2 3 3  2 '$ 0    Fall Risk    01/10/2022   10:06 AM 06/20/2021    8:02 AM 02/28/2021   10:43 AM 02/19/2021    9:03 AM 01/02/2021   10:57 AM  Fall Risk   Falls in the past year? 0 0 0 0 0  Number falls in past yr: 0 0 0 0 0  Injury with Fall? 0 0 0 0 0  Risk for fall due to : No Fall Risks No Fall Risks No Fall Risks No Fall Risks No Fall Risks  Follow up Falls evaluation completed Falls evaluation completed Falls evaluation completed Falls evaluation completed Falls prevention discussed    FALL RISK PREVENTION PERTAINING TO THE HOME:  Any stairs in or around  the home? No  If so, are there any without handrails? No  Home free of loose throw rugs in walkways, pet beds, electrical cords, etc? Yes  Adequate lighting in your home to reduce risk of falls? Yes   ASSISTIVE DEVICES UTILIZED TO PREVENT FALLS:  Life alert? No  Use of a cane, walker or w/c? No  Grab bars in the bathroom? Yes  Shower chair or bench in shower? Yes  Elevated toilet seat or a handicapped toilet? Yes   TIMED UP AND GO:  Was the test performed?  unable to perform, virtual appt .    Cognitive Function:    08/13/2015    9:34 AM  MMSE - Mini Mental State Exam  Orientation  to time 5  Orientation to Place 5  Registration 3  Attention/ Calculation 5  Recall 3  Language- name 2 objects 2        01/10/2022   10:07 AM 11/23/2019    3:00 PM 11/22/2018    3:08 PM 09/09/2017    2:17 PM 08/25/2016    8:38 AM  6CIT Screen  What Year? 0 points 0 points 0 points 0 points 0 points  What month? 0 points 0 points 0 points 0 points 0 points  What time? 0 points 0 points 0 points 0 points 0 points  Count back from 20 0 points 0 points 0 points 0 points 0 points  Months in reverse 0 points 0 points 0 points 0 points 0 points  Repeat phrase 0 points 0 points 0 points 0 points 0 points  Total Score 0 points 0 points 0 points 0 points 0 points    Immunizations Immunization History  Administered Date(s) Administered   Covid-19, Mrna,Vaccine(Spikevax)6yr and older 11/09/2021   Fluad Quad(high Dose 65+) 09/16/2018, 09/27/2019, 10/05/2020   Influenza, High Dose Seasonal PF 10/13/2016, 09/09/2017, 11/09/2021   Influenza,inj,Quad PF,6+ Mos 09/26/2014   Influenza-Unspecified 09/26/2014, 10/13/2016   PFIZER(Purple Top)SARS-COV-2 Vaccination 01/22/2019, 02/13/2019, 10/20/2019, 04/07/2020   Pfizer Covid-19 Vaccine Bivalent Booster 163yr& up 10/05/2020   Pneumococcal Conjugate-13 10/26/2013   Pneumococcal Polysaccharide-23 01/31/2011   Tdap 10/03/2010   Zoster Recombinat (Shingrix)  08/12/2017, 10/22/2017   Zoster, Live 04/16/2000    TDAP status: Due, Education has been provided regarding the importance of this vaccine. Advised may receive this vaccine at local pharmacy or Health Dept. Aware to provide a copy of the vaccination record if obtained from local pharmacy or Health Dept. Verbalized acceptance and understanding.  Flu Vaccine status: Due, Education has been provided regarding the importance of this vaccine. Advised may receive this vaccine at local pharmacy or Health Dept. Aware to provide a copy of the vaccination record if obtained from local pharmacy or Health Dept. Verbalized acceptance and understanding.  Pneumococcal vaccine status: Up to date  Covid-19 vaccine status: Information provided on how to obtain vaccines.   Qualifies for Shingles Vaccine? Yes   Zostavax completed Yes   Shingrix Completed?: Yes  Screening Tests Health Maintenance  Topic Date Due   DTaP/Tdap/Td (2 - Td or Tdap) 10/02/2020   COVID-19 Vaccine (7 - 2023-24 season) 01/04/2022   OPHTHALMOLOGY EXAM  12/18/2021   Diabetic kidney evaluation - Urine ACR  02/19/2022   FOOT EXAM  02/19/2022   HEMOGLOBIN A1C  04/08/2022   Diabetic kidney evaluation - eGFR measurement  10/08/2022   Medicare Annual Wellness (AWV)  01/11/2023   Pneumonia Vaccine 6572Years old  Completed   INFLUENZA VACCINE  Completed   Zoster Vaccines- Shingrix  Completed   HPV VACCINES  Aged Out    Health Maintenance  Health Maintenance Due  Topic Date Due   DTaP/Tdap/Td (2 - Td or Tdap) 10/02/2020   COVID-19 Vaccine (7 - 2023-24 season) 01/04/2022   OPHTHALMOLOGY EXAM  12/18/2021    Colorectal cancer screening: No longer required.   Lung Cancer Screening: (Low Dose CT Chest recommended if Age 86-80ears, 30 pack-year currently smoking OR have quit w/in 15years.) does not qualify.   Lung Cancer Screening Referral: not applicable   Additional Screening:  Hepatitis C Screening: does not  qualify  Vision Screening: Recommended annual ophthalmology exams for early detection of glaucoma and other disorders of the eye. Is the patient up to date with  their annual eye exam?  Yes  Who is the provider or what is the name of the office in which the patient attends annual eye exams?  If pt is not established with a provider, would they like to be referred to a provider to establish care? No .   Dental Screening: Recommended annual dental exams for proper oral hygiene  Community Resource Referral / Chronic Care Management: CRR required this visit?  No   CCM required this visit?  No      Plan:     I have personally reviewed and noted the following in the patient's chart:   Medical and social history Use of alcohol, tobacco or illicit drugs  Current medications and supplements including opioid prescriptions. Patient is not currently taking opioid prescriptions. Functional ability and status Nutritional status Physical activity Advanced directives List of other physicians Hospitalizations, surgeries, and ER visits in previous 12 months Vitals Screenings to include cognitive, depression, and falls Referrals and appointments  In addition, I have reviewed and discussed with patient certain preventive protocols, quality metrics, and best practice recommendations. A written personalized care plan for preventive services as well as general preventive health recommendations were provided to patient.    Mr. Swamy , Thank you for taking time to come for your Medicare Wellness Visit. I appreciate your ongoing commitment to your health goals. Please review the following plan we discussed and let me know if I can assist you in the future.   These are the goals we discussed:  Goals      DIET - INCREASE WATER INTAKE     Recommend to drink at least 6-8 8oz glasses of water per day.     Pharmacy Care plan     CARE PLAN ENTRY (see longitudinal plan of care for additional care  plan information)  Current Barriers:  Chronic Disease Management support, education, and care coordination needs related to Hypertension, Hyperlipidemia, Diabetes, Atrial Fibrillation, GERD, Chronic Kidney Disease, Osteoarthritis, and BPH   Hypertension/ Atrial fibrillation BP Readings from Last 3 Encounters:  01/18/20 126/71  11/23/19 108/62  09/29/19 110/68  Pharmacist Clinical Goal(s): Over the next 90 days, patient will work with PharmD and providers to maintain BP goal <130/80 Current regimen:  Amlodipine 2.5 mg qd Atenolol 50 mg qhs Losartan 50 mg bid HCTZ 12.5 mg qd Eliquis 5 mg bid (last scr 1.37) Interventions: Provided diet and exercise counseling Urgent request to CPA for PAP forms to be mailed to patient Reviewed signs/symptoms of bleeding Reviewed fill history Patient self care activities - Over the next 90 days, patient will: Check BP1-2 times weekly, document, and provide at future appointments Ensure daily salt intake < 2300 mg/day Avoid NSAIDS Contact PharmD if PAP not received in 5-7 days Complete and return PAP   Diabetes Lab Results  Component Value Date/Time   HGBA1C 6.1 (H) 01/18/2020 07:42 AM   HGBA1C 7.7 (H) 09/29/2019 08:49 AM  Pharmacist Clinical Goal(s): Over the next 90 days, patient will work with PharmD and providers to maintain A1c goal <7% Current regimen:  Metformin XR 1500 mg qhs Januvia 100 mg qd  Interventions: Praised patient for attaining goal A1c Reviewed symptoms of hypoglycemia Urgent request to CPA for mailing of PAP application Patient self care activities - Over the next 90  days, patient will: Check blood sugar once daily, document, and provide at future appointments Contact provider with any episodes of hypoglycemia Provide required portion of PAP Contact PharmD if PAP not received in 5-7 days  Medication management Pharmacist Clinical Goal(s): Over the next 90 days, patient will work with PharmD and providers to  maintain optimal medication adherence Current pharmacy: [facility]   Interventions Comprehensive medication review performed. Continue current medication management strategy Patient self care activities - Over the next 90 days, patient will: Focus on medication adherence by fill dates  Take medications as prescribed Report any questions or concerns to PharmD and/or provider(s)  Please see past updates related to this goal by clicking on the "Past Updates" button in the selected goal       Weight (lb) < 200 lb (90.7 kg)     Wants  To lose some weight after gaining over the summer.         This is a list of the screening recommended for you and due dates:  Health Maintenance  Topic Date Due   DTaP/Tdap/Td vaccine (2 - Td or Tdap) 10/02/2020   COVID-19 Vaccine (7 - 2023-24 season) 01/04/2022   Eye exam for diabetics  12/18/2021   Yearly kidney health urinalysis for diabetes  02/19/2022   Complete foot exam   02/19/2022   Hemoglobin A1C  04/08/2022   Yearly kidney function blood test for diabetes  10/08/2022   Medicare Annual Wellness Visit  01/11/2023   Pneumonia Vaccine  Completed   Flu Shot  Completed   Zoster (Shingles) Vaccine  Completed   HPV Vaccine  Aged [address] [place]   01/10/2022   Nurse Notes: Approximately 30 minute Non-Face -To-Face Medicare Wellness Visit

## 2022-01-10 NOTE — Patient Instructions (Signed)
Health Maintenance, Male Adopting a healthy lifestyle and getting preventive care are important in promoting health and wellness. Ask your health care provider about: The right schedule for you to have regular tests and exams. Things you can do on your own to prevent diseases and keep yourself healthy. What should I know about diet, weight, and exercise? Eat a healthy diet  Eat a diet that includes plenty of vegetables, fruits, low-fat dairy products, and lean protein. Do not eat a lot of foods that are high in solid fats, added sugars, or sodium. Maintain a healthy weight Body mass index (BMI) is a measurement that can be used to identify possible weight problems. It estimates body fat based on height and weight. Your health care provider can help determine your BMI and help you achieve or maintain a healthy weight. Get regular exercise Get regular exercise. This is one of the most important things you can do for your health. Most adults should: Exercise for at least 150 minutes each week. The exercise should increase your heart rate and make you sweat (moderate-intensity exercise). Do strengthening exercises at least twice a week. This is in addition to the moderate-intensity exercise. Spend less time sitting. Even light physical activity can be beneficial. Watch cholesterol and blood lipids Have your blood tested for lipids and cholesterol at 86 years of age, then have this test every 5 years. You may need to have your cholesterol levels checked more often if: Your lipid or cholesterol levels are high. You are older than 86 years of age. You are at high risk for heart disease. What should I know about cancer screening? Many types of cancers can be detected early and may often be prevented. Depending on your health history and family history, you may need to have cancer screening at various ages. This may include screening for: Colorectal cancer. Prostate cancer. Skin cancer. Lung  cancer. What should I know about heart disease, diabetes, and high blood pressure? Blood pressure and heart disease High blood pressure causes heart disease and increases the risk of stroke. This is more likely to develop in people who have high blood pressure readings or are overweight. Talk with your health care provider about your target blood pressure readings. Have your blood pressure checked: Every 3-5 years if you are 18-39 years of age. Every year if you are 40 years old or older. If you are between the ages of 65 and 75 and are a current or former smoker, ask your health care provider if you should have a one-time screening for abdominal aortic aneurysm (AAA). Diabetes Have regular diabetes screenings. This checks your fasting blood sugar level. Have the screening done: Once every three years after age 45 if you are at a normal weight and have a low risk for diabetes. More often and at a younger age if you are overweight or have a high risk for diabetes. What should I know about preventing infection? Hepatitis B If you have a higher risk for hepatitis B, you should be screened for this virus. Talk with your health care provider to find out if you are at risk for hepatitis B infection. Hepatitis C Blood testing is recommended for: Everyone born from 1945 through 1965. Anyone with known risk factors for hepatitis C. Sexually transmitted infections (STIs) You should be screened each year for STIs, including gonorrhea and chlamydia, if: You are sexually active and are younger than 86 years of age. You are older than 86 years of age and your   health care provider tells you that you are at risk for this type of infection. Your sexual activity has changed since you were last screened, and you are at increased risk for chlamydia or gonorrhea. Ask your health care provider if you are at risk. Ask your health care provider about whether you are at high risk for HIV. Your health care provider  may recommend a prescription medicine to help prevent HIV infection. If you choose to take medicine to prevent HIV, you should first get tested for HIV. You should then be tested every 3 months for as long as you are taking the medicine. Follow these instructions at home: Alcohol use Do not drink alcohol if your health care provider tells you not to drink. If you drink alcohol: Limit how much you have to 0-2 drinks a day. Know how much alcohol is in your drink. In the [place] one drink equals one 12 oz bottle of beer (355 mL), one 5 oz glass of wine (148 mL), or one 1 oz glass of hard liquor (44 mL). Lifestyle Do not use any products that contain nicotine or tobacco. These products include cigarettes, chewing tobacco, and vaping devices, such as e-cigarettes. If you need help quitting, ask your health care provider. Do not use street drugs. Do not share needles. Ask your health care provider for help if you need support or information about quitting drugs. General instructions Schedule regular health, dental, and eye exams. Stay current with your vaccines. Tell your health care provider if: You often feel depressed. You have ever been abused or do not feel safe at home. Summary Adopting a healthy lifestyle and getting preventive care are important in promoting health and wellness. Follow your health care provider's instructions about healthy diet, exercising, and getting tested or screened for diseases. Follow your health care provider's instructions on monitoring your cholesterol and blood pressure. This information is not intended to replace advice given to you by your health care provider. Make sure you discuss any questions you have with your health care provider. Document Revised: 05/14/2020 Document Reviewed: 05/14/2020 Elsevier Patient Education  2023 [employer] Inc.  

## 2022-01-13 DIAGNOSIS — D2371 Other benign neoplasm of skin of right lower limb, including hip: Secondary | ICD-10-CM | POA: Diagnosis not present

## 2022-01-13 DIAGNOSIS — M79671 Pain in right foot: Secondary | ICD-10-CM | POA: Diagnosis not present

## 2022-01-13 DIAGNOSIS — E1122 Type 2 diabetes mellitus with diabetic chronic kidney disease: Secondary | ICD-10-CM | POA: Diagnosis not present

## 2022-01-13 DIAGNOSIS — N183 Chronic kidney disease, stage 3 unspecified: Secondary | ICD-10-CM | POA: Diagnosis not present

## 2022-01-14 ENCOUNTER — Telehealth: Payer: Self-pay | Admitting: Radiation Oncology

## 2022-01-14 NOTE — Telephone Encounter (Signed)
1/9 @ 2:36 pm Left voicemail for patient to call our office to be schedule for consult.

## 2022-01-15 DIAGNOSIS — Z4802 Encounter for removal of sutures: Secondary | ICD-10-CM | POA: Diagnosis not present

## 2022-01-15 DIAGNOSIS — C4442 Squamous cell carcinoma of skin of scalp and neck: Secondary | ICD-10-CM | POA: Diagnosis not present

## 2022-01-15 NOTE — Progress Notes (Incomplete)
Head and Neck Cancer Location of Tumor / Histology:  Residual carcinoma with perineural invasion on right inferior helix     Patient presented with symptoms of:   Biopsies revealed:      Nutrition Status Yes No Comments  Weight changes? '[]'$  '[]'$    Swallowing concerns? '[]'$  '[]'$    PEG? '[]'$  '[]'$     Referrals Yes No Comments  Social Work? '[]'$  '[]'$    Dentistry? '[]'$  '[]'$    Swallowing therapy? '[]'$  '[]'$    Nutrition? '[]'$  '[]'$    Med/Onc? '[]'$  '[]'$     Safety Issues Yes No Comments  Prior radiation? '[]'$  '[]'$    Pacemaker/ICD? '[]'$  '[]'$    Possible current pregnancy? '[]'$  '[]'$    Is the patient on methotrexate? '[]'$  '[]'$     Tobacco/Marijuana/Snuff/ETOH use: {:18581}  Past/Anticipated interventions by otolaryngology, if any:   Mohs on 01-08-22   Past/Anticipated interventions by medical oncology, if any: none at this time  Current Complaints / other details:  pt has watchman device for afib,

## 2022-01-18 IMAGING — DX DG KNEE 1-2V PORT*R*
2 series · 2 of 2 positions shown · non-contrast
Comparison: None.

CLINICAL DATA: Status post right total knee replacement.

EXAM:
PORTABLE RIGHT KNEE - 1-2 VIEW

[knee ap]
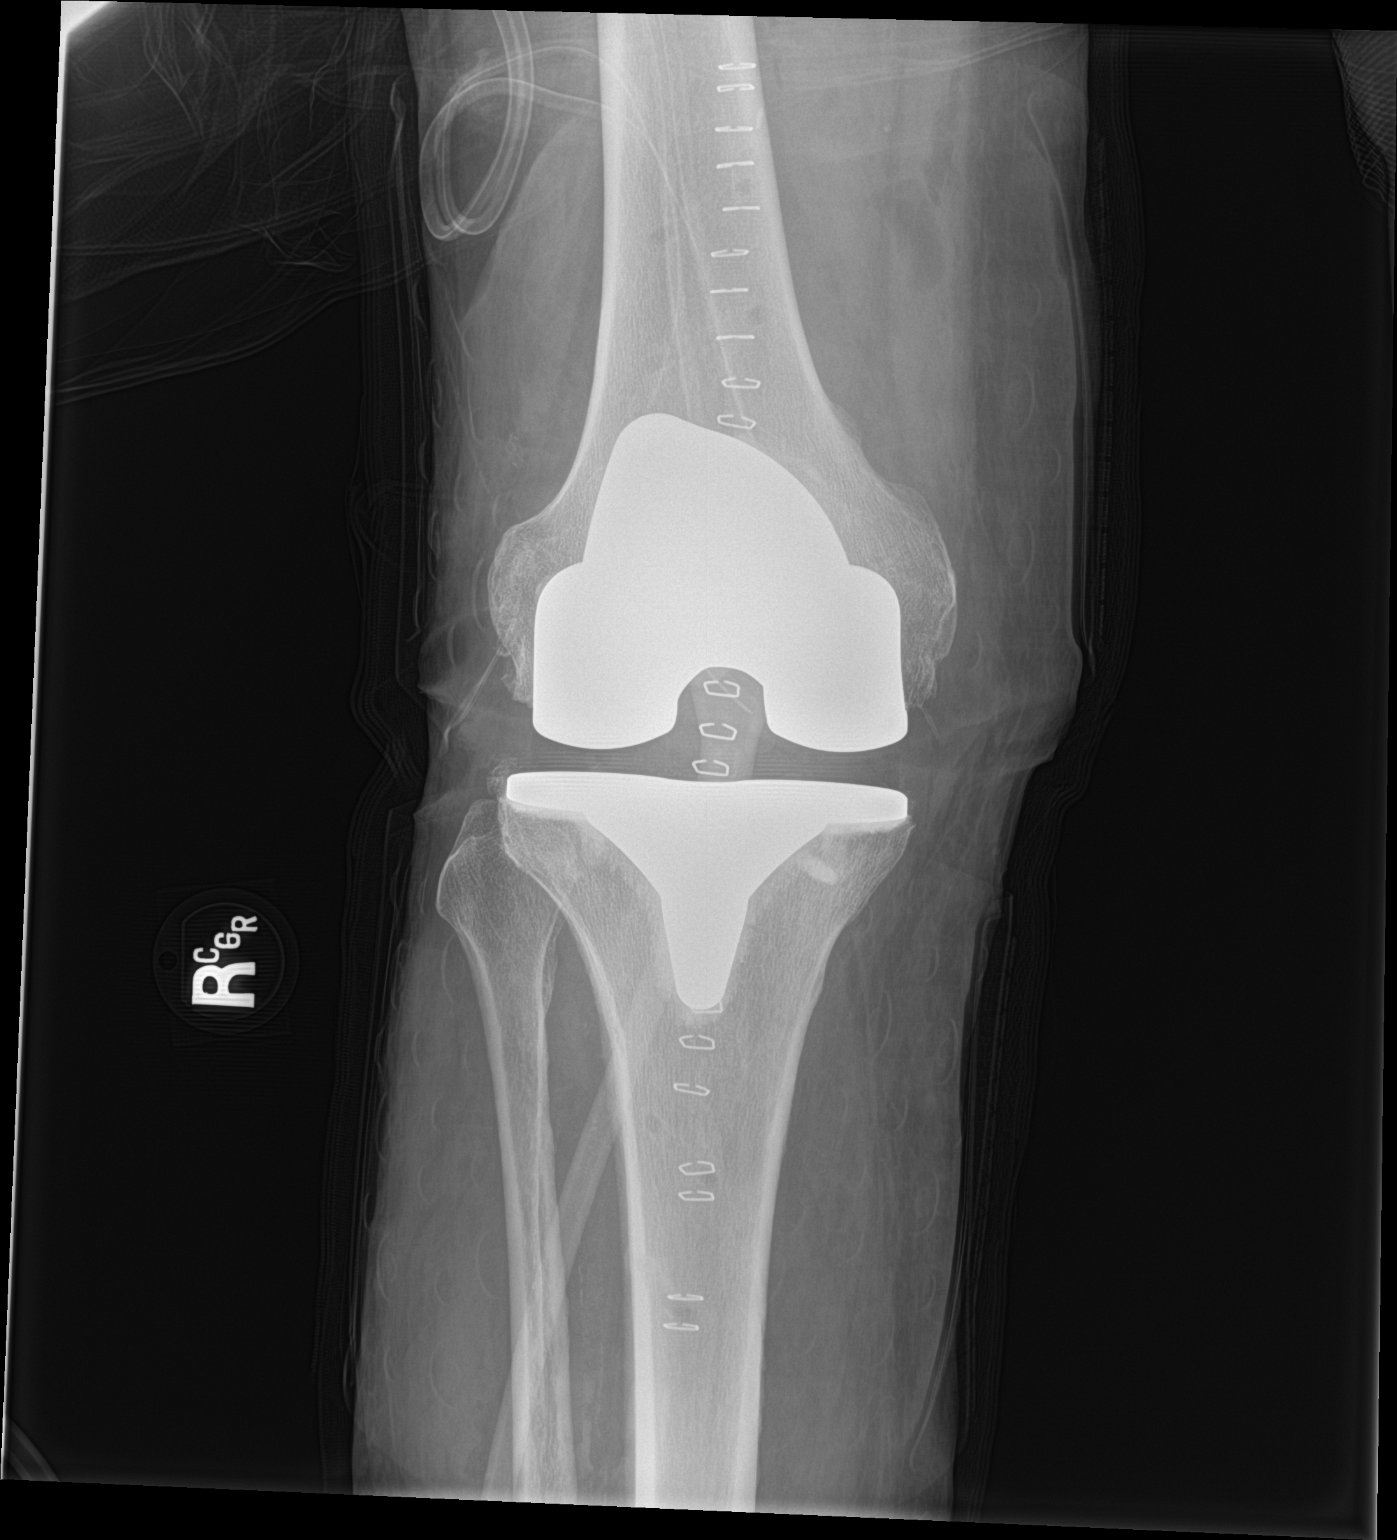

[knee lat]
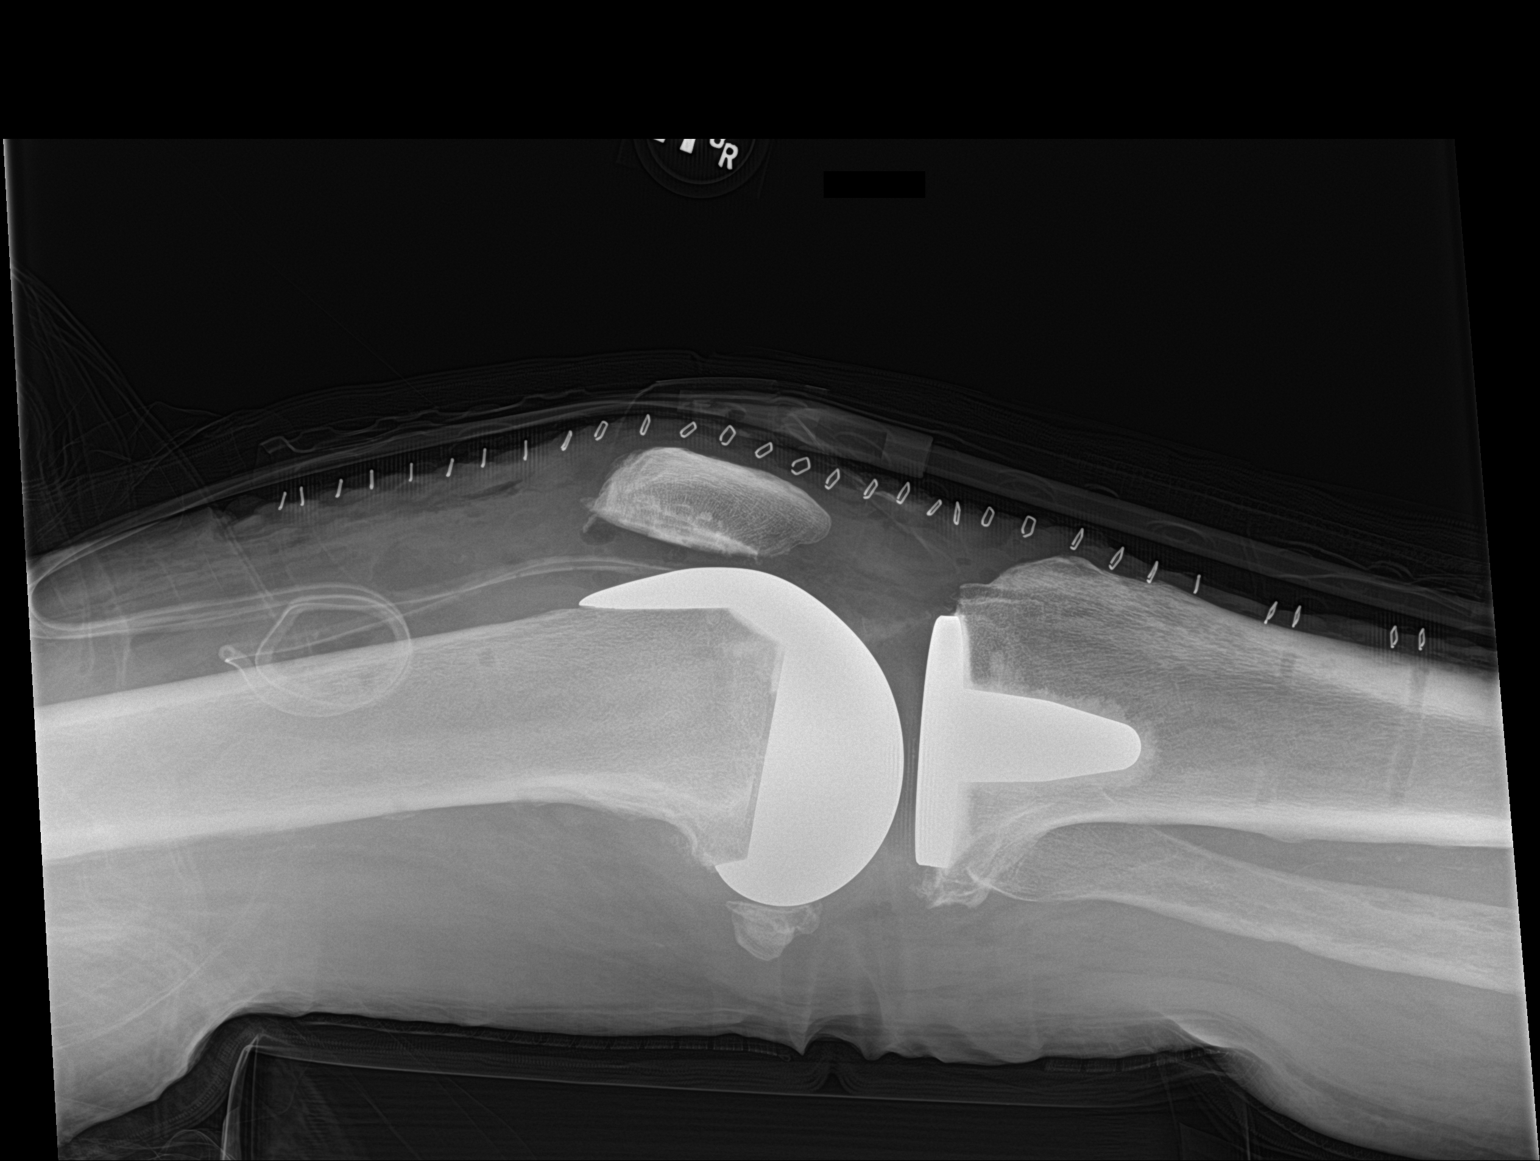

[2 of 2 positions shown; findings below may reference images not displayed]

FINDINGS: The right femoral and tibial components are well situated. Surgical
drain is seen in the soft tissues anterior to the distal femur.
Other expected postoperative changes are noted anteriorly as well.
IMPRESSION: Status post right total knee arthroplasty.

## 2022-01-20 DIAGNOSIS — Z0100 Encounter for examination of eyes and vision without abnormal findings: Secondary | ICD-10-CM | POA: Diagnosis not present

## 2022-01-21 ENCOUNTER — Ambulatory Visit: Payer: Medicare HMO

## 2022-01-21 ENCOUNTER — Ambulatory Visit
Admission: RE | Admit: 2022-01-21 | Discharge: 2022-01-21 | Disposition: A | Discharge status: Home / Self Care | Payer: Medicare HMO | Source: Ambulatory Visit | Attending: Radiation Oncology | Admitting: Radiation Oncology

## 2022-01-21 DIAGNOSIS — C44202 Unspecified malignant neoplasm of skin of right ear and external auricular canal: Secondary | ICD-10-CM

## 2022-01-24 ENCOUNTER — Encounter: Payer: Self-pay | Admitting: Radiation Oncology

## 2022-01-24 ENCOUNTER — Ambulatory Visit
Admission: RE | Admit: 2022-01-24 | Discharge: 2022-01-24 | Disposition: A | Discharge status: Home / Self Care | Payer: Medicare HMO | Source: Ambulatory Visit | Attending: Radiation Oncology | Admitting: Radiation Oncology

## 2022-01-24 VITALS — BP 176/90 | HR 73 | Temp 98.4°F | Resp 16 | Wt 167.0 lb

## 2022-01-24 DIAGNOSIS — I10 Essential (primary) hypertension: Secondary | ICD-10-CM | POA: Insufficient documentation

## 2022-01-24 DIAGNOSIS — N183 Chronic kidney disease, stage 3 unspecified: Secondary | ICD-10-CM | POA: Insufficient documentation

## 2022-01-24 DIAGNOSIS — M5136 Other intervertebral disc degeneration, lumbar region: Secondary | ICD-10-CM | POA: Diagnosis not present

## 2022-01-24 DIAGNOSIS — Z7984 Long term (current) use of oral hypoglycemic drugs: Secondary | ICD-10-CM | POA: Diagnosis not present

## 2022-01-24 DIAGNOSIS — Z806 Family history of leukemia: Secondary | ICD-10-CM | POA: Insufficient documentation

## 2022-01-24 DIAGNOSIS — Z79899 Other long term (current) drug therapy: Secondary | ICD-10-CM | POA: Insufficient documentation

## 2022-01-24 DIAGNOSIS — C44202 Unspecified malignant neoplasm of skin of right ear and external auricular canal: Secondary | ICD-10-CM | POA: Diagnosis not present

## 2022-01-24 DIAGNOSIS — Z7982 Long term (current) use of aspirin: Secondary | ICD-10-CM | POA: Diagnosis not present

## 2022-01-24 DIAGNOSIS — N400 Enlarged prostate without lower urinary tract symptoms: Secondary | ICD-10-CM | POA: Insufficient documentation

## 2022-01-24 DIAGNOSIS — M1990 Unspecified osteoarthritis, unspecified site: Secondary | ICD-10-CM | POA: Diagnosis not present

## 2022-01-24 DIAGNOSIS — K219 Gastro-esophageal reflux disease without esophagitis: Secondary | ICD-10-CM | POA: Insufficient documentation

## 2022-01-24 DIAGNOSIS — E119 Type 2 diabetes mellitus without complications: Secondary | ICD-10-CM | POA: Insufficient documentation

## 2022-01-24 DIAGNOSIS — D509 Iron deficiency anemia, unspecified: Secondary | ICD-10-CM | POA: Insufficient documentation

## 2022-01-24 DIAGNOSIS — Z85828 Personal history of other malignant neoplasm of skin: Secondary | ICD-10-CM | POA: Diagnosis not present

## 2022-01-24 DIAGNOSIS — E785 Hyperlipidemia, unspecified: Secondary | ICD-10-CM | POA: Insufficient documentation

## 2022-01-24 NOTE — Consult Note (Signed)
NEW PATIENT EVALUATION  Name: Maxwell Stanley  MRN: [id number]  Date:   01/24/2022     DOB: [birth year]   This 88 y.o. 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  REFERRING PHYSICIAN: Glean Hess, MD  CHIEF COMPLAINT:  Chief Complaint  Patient presents with   Malignant neoplasm of the right ear    DIAGNOSIS: The encounter diagnosis was Malignant neoplasm of right ear.   PREVIOUS INVESTIGATIONS:  Pathology reports reviewed Clinical notes reviewed   HPI: Patient is a 88 year old male original surgery on right helix back in June for invasive squamous cell carcinoma extending to the deep margin.  Underwent reexcision showing residual carcinoma with perineural invasion involving at least 2 nerves.  Patient is referred today for adjuvant radiation therapy he is doing well he has multiple facial skin cancers has had some excisions of his 6 scalp region.  He is otherwise asymptomatic.  Patient is also had skin cancer removed on the right medial cheek region.  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, Carpal tunnel syndrome on left, Cataract (11/2018), Chronic back pain, CKD stage 3 secondary to diabetes ([place]), DDD (degenerative disc disease), lumbar, Diastolic dysfunction (02/54/2706), Elevated PSA, GERD (gastroesophageal reflux disease), History of basal cell carcinoma (BCC) (08/21/2020), History of squamous cell carcinoma of skin (2021), Hyperlipidemia, Hypertension, IDA (iron deficiency anemia), Leg varices, Lumbar spinal stenosis, Nocturia, Organic  impotence, Osteoarthritis, PONV (postoperative nausea and vomiting), Presence of Watchman left atrial appendage closure device (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    PAST SURGICAL HISTORY:  Past Surgical History:  Procedure Laterality Date   ANKLE ARTHROSCOPY WITH OPEN REDUCTION INTERNAL FIXATION (ORIF) Left 1998   COLONOSCOPY     COLONOSCOPY WITH PROPOFOL N/A 05/28/2021   Procedure: COLONOSCOPY WITH PROPOFOL;  Surgeon: Lucilla Lame, MD;  Location: [facility] ENDOSCOPY;  Service: Endoscopy;  Laterality: N/A;   ESOPHAGOGASTRODUODENOSCOPY (EGD) WITH PROPOFOL N/A 05/28/2021   Procedure: ESOPHAGOGASTRODUODENOSCOPY (EGD) WITH PROPOFOL;  Surgeon: Lucilla Lame, MD;  Location: [facility] ENDOSCOPY;  Service: Endoscopy;  Laterality: N/A;   ETHMOIDECTOMY Left 03/08/2020   Procedure: TOTAL ETHMOIDECTOMY;  Surgeon: Margaretha Sheffield, MD;  Location: [place];  Service: ENT;  Laterality: Left;   FRONTAL SINUS EXPLORATION Left 03/08/2020   Procedure: FRONTAL SINUS EXPLORATION;  Surgeon: Margaretha Sheffield, MD;  Location: [place];  Service: ENT;  Laterality: Left;   GIVENS CAPSULE STUDY N/A 07/11/2021   Procedure: GIVENS CAPSULE STUDY;  Surgeon: Lucilla Lame, MD;  Location: [facility] ENDOSCOPY;  Service: Endoscopy;  Laterality: N/A;   HEMORRHOID SURGERY     IMAGE GUIDED SINUS SURGERY N/A 03/08/2020   Procedure: IMAGE GUIDED SINUS SURGERY;  Surgeon: Margaretha Sheffield, MD;  Location: [place];  Service: ENT;  Laterality: N/A;  placed disk on OR charge nurse desk 2-21 kp PLACED 2ND DISK ON OR CHARGE NURSE DESK 02-28-20 KP 3RD DISK ON OR CHARGE NURSE DESK 2-25- [place] Right 11/12/2018   Procedure: HERNIA REPAIR INGUINAL ADULT;  Surgeon:  Fredirick Maudlin, MD;  Location: [facility] ORS;   Service: General;  Laterality: Right;   INGUINAL HERNIA REPAIR Left 2012   KNEE ARTHROPLASTY Right 04/16/2020   Procedure: COMPUTER ASSISTED TOTAL KNEE ARTHROPLASTY;  Surgeon: Dereck Leep, MD;  Location: [facility] ORS;  Service: Orthopedics;  Laterality: Right;   KNEE ARTHROSCOPY Right 2006   MAXILLARY ANTROSTOMY Left 03/08/2020   Procedure: MAXILLARY ANTROSTOMY WITH TISSUE REMOVAL;  Surgeon: Margaretha Sheffield, MD;  Location: [place];  Service: ENT;  Laterality: Left;   SEPTOPLASTY N/A 03/08/2020   Procedure: SEPTOPLASTY;  Surgeon: Margaretha Sheffield, MD;  Location: [place];  Service: ENT;  Laterality: N/A;   skin cancer resection Right 04/07/2018   ON hand.   THUMB ARTHROSCOPY Right 2013   tendon flap to joint   TONSILLECTOMY     TOTAL HIP ARTHROPLASTY Right 10/21/2021   Procedure: TOTAL HIP ARTHROPLASTY;  Surgeon: Dereck Leep, MD;  Location: [facility] ORS;  Service: Orthopedics;  Laterality: Right;   UMBILICAL HERNIA REPAIR Left 2012   UPPER GI ENDOSCOPY      FAMILY HISTORY: family history includes Coronary artery disease in his father; Diabetes in his mother; Hyperlipidemia in his father; Hypertension in his father; Leukemia in his father.  SOCIAL HISTORY:  reports that he has never smoked. He has never used smokeless tobacco. He reports that he does not drink alcohol and does not use drugs.  ALLERGIES: Codeine, Ceftriaxone, and Penicillin g  MEDICATIONS:  Current Outpatient Medications  Medication Sig Dispense Refill   acetaminophen (TYLENOL) 500 MG tablet Take 1,000 mg by mouth 2 (two) times daily.     amLODipine (NORVASC) 2.5 MG tablet TAKE 1 TABLET DAILY 90 tablet 1   aspirin EC 81 MG tablet Take 81 mg by mouth daily. Swallow whole.     atenolol (TENORMIN) 50 MG tablet TAKE 1 TABLET AT BEDTIME 90 tablet 1   cholecalciferol (VITAMIN D) 1000 UNITS tablet Take 1,000 Units by mouth daily.     clopidogrel (PLAVIX) 75 MG tablet Take 75 mg by mouth daily.     docusate  sodium (COLACE) 100 MG capsule Take 200 mg by mouth daily.      ferrous sulfate 325 (65 FE) MG EC tablet Take 1 tablet by mouth daily.     finasteride (PROSCAR) 5 MG tablet TAKE 1 TABLET DAILY 90 tablet 1   fluticasone (FLONASE) 50 MCG/ACT nasal spray Place 2 sprays into both nostrils daily. (Patient taking differently: Place 2 sprays into both nostrils as needed for allergies.) 48 g 2   glucose blood (ONETOUCH ULTRA) test strip Use as instructed 100 each 12   losartan (COZAAR) 100 MG tablet TAKE 1/2 TABLET TWICE A DAY (Patient taking differently: Take 50 mg by mouth 2 (two) times daily.) 90 tablet 1   Melatonin 10 MG CAPS Take 10 mg by mouth at bedtime.     meloxicam (MOBIC) 7.5 MG tablet Take 7.5 mg by mouth 2 (two) times daily.     metFORMIN (GLUCOPHAGE-XR) 500 MG 24 hr tablet Take 1 tablet (500 mg total) by mouth 2 (two) times daily with a meal. (Patient taking differently: Take 500 mg by mouth daily with breakfast.) 180 tablet 1   Misc Natural Products (OSTEO BI-FLEX TRIPLE STRENGTH PO) Take 2 tablets by mouth daily at 6 (six) AM.     Multiple Vitamin (MULTIVITAMIN WITH MINERALS) TABS tablet Take 1 tablet by mouth daily.     OneTouch Delica Lancets 62M MISC 1 each by Does not  apply route daily. Use to test blood sugar once daily. 100 each 12   pantoprazole (PROTONIX) 40 MG tablet Take 1 tablet (40 mg total) by mouth 2 (two) times daily. 180 tablet 1   Probiotic Product (PROBIOTIC DAILY PO) Take 1 tablet by mouth daily.     rosuvastatin (CRESTOR) 20 MG tablet TAKE 1 TABLET AT BEDTIME 90 tablet 1   scopolamine (TRANSDERM SCOP, 1.5 MG,) 1 MG/3DAYS Place 1 patch onto the skin See admin instructions. PRN for deep see fishing     sitaGLIPtin (JANUVIA) 100 MG tablet Take 1 tablet (100 mg total) by mouth daily. (Patient taking differently: Take 50 mg by mouth every morning.) 90 tablet 1   tacrolimus (PROTOPIC) 0.1 % ointment Apply topically 2 (two) times daily. 60 g 2   tamsulosin (FLOMAX) 0.4 MG CAPS  capsule TAKE 1 CAPSULE DAILY AFTER SUPPER 90 capsule 0   traMADol (ULTRAM) 50 MG tablet Take 1 tablet (50 mg total) by mouth every 4 (four) hours as needed for moderate pain. (Patient not taking: Reported on 01/10/2022) 30 tablet 0   No current facility-administered medications for this encounter.    ECOG PERFORMANCE STATUS:  0 - Asymptomatic  REVIEW OF SYSTEMS: Patient denies any weight loss, fatigue, weakness, fever, chills or night sweats. Patient denies any loss of vision, blurred vision. Patient denies any ringing  of the ears or hearing loss. No irregular heartbeat. Patient denies heart murmur or history of fainting. Patient denies any chest pain or pain radiating to her upper extremities. Patient denies any shortness of breath, difficulty breathing at night, cough or hemoptysis. Patient denies any swelling in the lower legs. Patient denies any nausea vomiting, vomiting of blood, or coffee ground material in the vomitus. Patient denies any stomach pain. Patient states has had normal bowel movements no significant constipation or diarrhea. Patient denies any dysuria, hematuria or significant nocturia. Patient denies any problems walking, swelling in the joints or loss of balance. Patient denies any skin changes, loss of hair or loss of weight. Patient denies any excessive worrying or anxiety or significant depression. Patient denies any problems with insomnia. Patient denies excessive thirst, polyuria, polydipsia. Patient denies any swollen glands, patient denies easy bruising or easy bleeding. Patient denies any recent infections, allergies or URI. Patient "s visual fields have not changed significantly in recent time.   PHYSICAL EXAM: BP (!) 176/90 (BP Location: Right Arm, Patient Position: Sitting, Cuff Size: Normal)   Pulse 73   Temp 98.4 F (36.9 C) (Tympanic)   Resp 16   Wt 167 lb (75.8 kg)   BMI 21.44 kg/m  Had resection of partial of the right helix.  No overt evidence of skin cancer  seen at this time.  No evidence of cervical or supraclavicular adenopathy.  Well-developed well-nourished patient in NAD. HEENT reveals PERLA, EOMI, discs not visualized.  Oral cavity is clear. No oral mucosal lesions are identified. Neck is clear without evidence of cervical or supraclavicular adenopathy. Lungs are clear to A&P. Cardiac examination is essentially unremarkable with regular rate and rhythm without murmur rub or thrill. Abdomen is benign with no organomegaly or masses noted. Motor sensory and DTR levels are equal and symmetric in the upper and lower extremities. Cranial nerves II through XII are grossly intact. Proprioception is intact. No peripheral adenopathy or edema is identified. No motor or sensory levels are noted. Crude visual fields are within normal range.  LABORATORY DATA: Pathology reports reviewed    RADIOLOGY RESULTS: No current films  for review   IMPRESSION: Residual squamous cell carcinoma of the right helix status post surgery x 25 in 88 year old male  PLAN: This time elected to have electron-beam therapy to his right helix.  Would plan on delivering 50 Gray in 25 fractions.  Risks and benefits of treatment including possible fatigue possible skin reaction all were reviewed with the patient.  We use electron beam which will prevent radiation from affecting any other head and neck organs such as his larynx oral cavity or salivary glands.  I have personally set up and ordered CT simulation for next week.  Patient comprehends my recommendations well.  I would like to take this opportunity to thank you for allowing me to participate in the care of your patient.Noreene Filbert, MD

## 2022-01-28 ENCOUNTER — Telehealth: Payer: Self-pay | Admitting: Internal Medicine

## 2022-01-28 NOTE — Telephone Encounter (Signed)
Copied from [place] [id number]. Topic: General - Other >> Jan 28, 2022 10:10 AM Cyndi Bender wrote: Reason for CRM: Pt requests that Jacqulyn Bath returns his call at [phone]

## 2022-01-28 NOTE — Telephone Encounter (Signed)
Called pt rescheduled his CPE.  KP

## 2022-01-29 ENCOUNTER — Other Ambulatory Visit: Payer: Self-pay | Admitting: Internal Medicine

## 2022-01-29 DIAGNOSIS — N401 Enlarged prostate with lower urinary tract symptoms: Secondary | ICD-10-CM

## 2022-01-29 DIAGNOSIS — E118 Type 2 diabetes mellitus with unspecified complications: Secondary | ICD-10-CM

## 2022-01-29 DIAGNOSIS — I10 Essential (primary) hypertension: Secondary | ICD-10-CM

## 2022-01-30 ENCOUNTER — Ambulatory Visit
Admission: RE | Admit: 2022-01-30 | Discharge: 2022-01-30 | Disposition: A | Discharge status: Home / Self Care | Payer: Medicare HMO | Source: Ambulatory Visit | Attending: Radiation Oncology | Admitting: Radiation Oncology

## 2022-01-30 DIAGNOSIS — C44202 Unspecified malignant neoplasm of skin of right ear and external auricular canal: Secondary | ICD-10-CM | POA: Diagnosis not present

## 2022-02-04 DIAGNOSIS — C44202 Unspecified malignant neoplasm of skin of right ear and external auricular canal: Secondary | ICD-10-CM | POA: Diagnosis not present

## 2022-02-10 ENCOUNTER — Ambulatory Visit
Admission: RE | Admit: 2022-02-10 | Discharge: 2022-02-10 | Disposition: A | Discharge status: Home / Self Care | Payer: Medicare HMO | Source: Ambulatory Visit | Attending: Radiation Oncology | Admitting: Radiation Oncology

## 2022-02-10 ENCOUNTER — Other Ambulatory Visit: Payer: Self-pay

## 2022-02-10 DIAGNOSIS — C44202 Unspecified malignant neoplasm of skin of right ear and external auricular canal: Secondary | ICD-10-CM | POA: Insufficient documentation

## 2022-02-10 LAB — RAD ONC ARIA SESSION SUMMARY
Course Elapsed Days: 0
Plan Fractions Treated to Date: 1
Plan Prescribed Dose Per Fraction: 2 Gy
Plan Total Fractions Prescribed: 25
Plan Total Prescribed Dose: 50 Gy
Reference Point Dosage Given to Date: 2 Gy
Reference Point Session Dosage Given: 2 Gy
Session Number: 1

## 2022-02-11 ENCOUNTER — Ambulatory Visit
Admission: RE | Admit: 2022-02-11 | Discharge: 2022-02-11 | Disposition: A | Discharge status: Home / Self Care | Payer: Medicare HMO | Source: Ambulatory Visit | Attending: Radiation Oncology | Admitting: Radiation Oncology

## 2022-02-11 ENCOUNTER — Other Ambulatory Visit: Payer: Self-pay

## 2022-02-11 DIAGNOSIS — C44202 Unspecified malignant neoplasm of skin of right ear and external auricular canal: Secondary | ICD-10-CM | POA: Diagnosis not present

## 2022-02-11 LAB — RAD ONC ARIA SESSION SUMMARY
Course Elapsed Days: 1
Plan Fractions Treated to Date: 2
Plan Prescribed Dose Per Fraction: 2 Gy
Plan Total Fractions Prescribed: 25
Plan Total Prescribed Dose: 50 Gy
Reference Point Dosage Given to Date: 4 Gy
Reference Point Session Dosage Given: 2 Gy
Session Number: 2

## 2022-02-12 ENCOUNTER — Ambulatory Visit
Admission: RE | Admit: 2022-02-12 | Discharge: 2022-02-12 | Disposition: A | Discharge status: Home / Self Care | Payer: Medicare HMO | Source: Ambulatory Visit | Attending: Radiation Oncology | Admitting: Radiation Oncology

## 2022-02-12 ENCOUNTER — Other Ambulatory Visit: Payer: Self-pay

## 2022-02-12 DIAGNOSIS — C44202 Unspecified malignant neoplasm of skin of right ear and external auricular canal: Secondary | ICD-10-CM | POA: Diagnosis not present

## 2022-02-12 LAB — RAD ONC ARIA SESSION SUMMARY
Course Elapsed Days: 2
Plan Fractions Treated to Date: 3
Plan Prescribed Dose Per Fraction: 2 Gy
Plan Total Fractions Prescribed: 25
Plan Total Prescribed Dose: 50 Gy
Reference Point Dosage Given to Date: 6 Gy
Reference Point Session Dosage Given: 2 Gy
Session Number: 3

## 2022-02-13 ENCOUNTER — Other Ambulatory Visit: Payer: Self-pay

## 2022-02-13 ENCOUNTER — Ambulatory Visit
Admission: RE | Admit: 2022-02-13 | Discharge: 2022-02-13 | Disposition: A | Discharge status: Home / Self Care | Payer: Medicare HMO | Source: Ambulatory Visit | Attending: Radiation Oncology | Admitting: Radiation Oncology

## 2022-02-13 DIAGNOSIS — C44202 Unspecified malignant neoplasm of skin of right ear and external auricular canal: Secondary | ICD-10-CM | POA: Diagnosis not present

## 2022-02-13 LAB — RAD ONC ARIA SESSION SUMMARY
Course Elapsed Days: 3
Plan Fractions Treated to Date: 4
Plan Prescribed Dose Per Fraction: 2 Gy
Plan Total Fractions Prescribed: 25
Plan Total Prescribed Dose: 50 Gy
Reference Point Dosage Given to Date: 8 Gy
Reference Point Session Dosage Given: 2 Gy
Session Number: 4

## 2022-02-14 ENCOUNTER — Other Ambulatory Visit: Payer: Self-pay

## 2022-02-14 ENCOUNTER — Ambulatory Visit
Admission: RE | Admit: 2022-02-14 | Discharge: 2022-02-14 | Disposition: A | Discharge status: Home / Self Care | Payer: Medicare HMO | Source: Ambulatory Visit | Attending: Radiation Oncology | Admitting: Radiation Oncology

## 2022-02-14 DIAGNOSIS — Z510 Encounter for antineoplastic radiation therapy: Secondary | ICD-10-CM | POA: Diagnosis not present

## 2022-02-14 DIAGNOSIS — C44202 Unspecified malignant neoplasm of skin of right ear and external auricular canal: Secondary | ICD-10-CM | POA: Diagnosis not present

## 2022-02-14 LAB — RAD ONC ARIA SESSION SUMMARY
Course Elapsed Days: 4
Plan Fractions Treated to Date: 5
Plan Prescribed Dose Per Fraction: 2 Gy
Plan Total Fractions Prescribed: 25
Plan Total Prescribed Dose: 50 Gy
Reference Point Dosage Given to Date: 10 Gy
Reference Point Session Dosage Given: 2 Gy
Session Number: 5

## 2022-02-17 ENCOUNTER — Ambulatory Visit
Admission: RE | Admit: 2022-02-17 | Discharge: 2022-02-17 | Disposition: A | Discharge status: Home / Self Care | Payer: Medicare HMO | Source: Ambulatory Visit | Attending: Radiation Oncology | Admitting: Radiation Oncology

## 2022-02-17 ENCOUNTER — Other Ambulatory Visit: Payer: Self-pay

## 2022-02-17 DIAGNOSIS — C44202 Unspecified malignant neoplasm of skin of right ear and external auricular canal: Secondary | ICD-10-CM | POA: Diagnosis not present

## 2022-02-17 LAB — RAD ONC ARIA SESSION SUMMARY
Course Elapsed Days: 7
Plan Fractions Treated to Date: 6
Plan Prescribed Dose Per Fraction: 2 Gy
Plan Total Fractions Prescribed: 25
Plan Total Prescribed Dose: 50 Gy
Reference Point Dosage Given to Date: 12 Gy
Reference Point Session Dosage Given: 2 Gy
Session Number: 6

## 2022-02-18 ENCOUNTER — Ambulatory Visit
Admission: RE | Admit: 2022-02-18 | Discharge: 2022-02-18 | Disposition: A | Discharge status: Home / Self Care | Payer: Medicare HMO | Source: Ambulatory Visit | Attending: Radiation Oncology | Admitting: Radiation Oncology

## 2022-02-18 ENCOUNTER — Other Ambulatory Visit: Payer: Self-pay

## 2022-02-18 DIAGNOSIS — C44202 Unspecified malignant neoplasm of skin of right ear and external auricular canal: Secondary | ICD-10-CM | POA: Diagnosis not present

## 2022-02-18 LAB — RAD ONC ARIA SESSION SUMMARY
Course Elapsed Days: 8
Plan Fractions Treated to Date: 7
Plan Prescribed Dose Per Fraction: 2 Gy
Plan Total Fractions Prescribed: 25
Plan Total Prescribed Dose: 50 Gy
Reference Point Dosage Given to Date: 14 Gy
Reference Point Session Dosage Given: 2 Gy
Session Number: 7

## 2022-02-19 ENCOUNTER — Other Ambulatory Visit: Payer: Self-pay

## 2022-02-19 ENCOUNTER — Ambulatory Visit
Admission: RE | Admit: 2022-02-19 | Discharge: 2022-02-19 | Disposition: A | Discharge status: Home / Self Care | Payer: Medicare HMO | Source: Ambulatory Visit | Attending: Radiation Oncology | Admitting: Radiation Oncology

## 2022-02-19 DIAGNOSIS — C44202 Unspecified malignant neoplasm of skin of right ear and external auricular canal: Secondary | ICD-10-CM | POA: Diagnosis not present

## 2022-02-19 LAB — RAD ONC ARIA SESSION SUMMARY
Course Elapsed Days: 9
Plan Fractions Treated to Date: 8
Plan Prescribed Dose Per Fraction: 2 Gy
Plan Total Fractions Prescribed: 25
Plan Total Prescribed Dose: 50 Gy
Reference Point Dosage Given to Date: 16 Gy
Reference Point Session Dosage Given: 2 Gy
Session Number: 8

## 2022-02-20 ENCOUNTER — Ambulatory Visit
Admission: RE | Admit: 2022-02-20 | Discharge: 2022-02-20 | Disposition: A | Discharge status: Home / Self Care | Payer: Medicare HMO | Source: Ambulatory Visit | Attending: Radiation Oncology | Admitting: Radiation Oncology

## 2022-02-20 ENCOUNTER — Other Ambulatory Visit: Payer: Self-pay

## 2022-02-20 DIAGNOSIS — C44202 Unspecified malignant neoplasm of skin of right ear and external auricular canal: Secondary | ICD-10-CM | POA: Diagnosis not present

## 2022-02-20 LAB — RAD ONC ARIA SESSION SUMMARY
Course Elapsed Days: 10
Plan Fractions Treated to Date: 9
Plan Prescribed Dose Per Fraction: 2 Gy
Plan Total Fractions Prescribed: 25
Plan Total Prescribed Dose: 50 Gy
Reference Point Dosage Given to Date: 18 Gy
Reference Point Session Dosage Given: 2 Gy
Session Number: 9

## 2022-02-21 ENCOUNTER — Other Ambulatory Visit: Payer: Self-pay

## 2022-02-21 ENCOUNTER — Ambulatory Visit
Admission: RE | Admit: 2022-02-21 | Discharge: 2022-02-21 | Disposition: A | Discharge status: Home / Self Care | Payer: Medicare HMO | Source: Ambulatory Visit | Attending: Radiation Oncology | Admitting: Radiation Oncology

## 2022-02-21 ENCOUNTER — Encounter: Payer: Medicare HMO | Admitting: Internal Medicine

## 2022-02-21 DIAGNOSIS — Z510 Encounter for antineoplastic radiation therapy: Secondary | ICD-10-CM | POA: Diagnosis not present

## 2022-02-21 DIAGNOSIS — C44202 Unspecified malignant neoplasm of skin of right ear and external auricular canal: Secondary | ICD-10-CM | POA: Diagnosis not present

## 2022-02-21 LAB — RAD ONC ARIA SESSION SUMMARY
Course Elapsed Days: 11
Plan Fractions Treated to Date: 10
Plan Prescribed Dose Per Fraction: 2 Gy
Plan Total Fractions Prescribed: 25
Plan Total Prescribed Dose: 50 Gy
Reference Point Dosage Given to Date: 20 Gy
Reference Point Session Dosage Given: 2 Gy
Session Number: 10

## 2022-02-24 ENCOUNTER — Other Ambulatory Visit: Payer: Self-pay

## 2022-02-24 ENCOUNTER — Ambulatory Visit
Admission: RE | Admit: 2022-02-24 | Discharge: 2022-02-24 | Disposition: A | Discharge status: Home / Self Care | Payer: Medicare HMO | Source: Ambulatory Visit | Attending: Radiation Oncology | Admitting: Radiation Oncology

## 2022-02-24 DIAGNOSIS — C44202 Unspecified malignant neoplasm of skin of right ear and external auricular canal: Secondary | ICD-10-CM | POA: Diagnosis not present

## 2022-02-24 LAB — RAD ONC ARIA SESSION SUMMARY
Course Elapsed Days: 14
Plan Fractions Treated to Date: 11
Plan Prescribed Dose Per Fraction: 2 Gy
Plan Total Fractions Prescribed: 25
Plan Total Prescribed Dose: 50 Gy
Reference Point Dosage Given to Date: 22 Gy
Reference Point Session Dosage Given: 2 Gy
Session Number: 11

## 2022-02-25 ENCOUNTER — Other Ambulatory Visit: Payer: Self-pay

## 2022-02-25 ENCOUNTER — Ambulatory Visit
Admission: RE | Admit: 2022-02-25 | Discharge: 2022-02-25 | Disposition: A | Discharge status: Home / Self Care | Payer: Medicare HMO | Source: Ambulatory Visit | Attending: Radiation Oncology | Admitting: Radiation Oncology

## 2022-02-25 DIAGNOSIS — C44202 Unspecified malignant neoplasm of skin of right ear and external auricular canal: Secondary | ICD-10-CM | POA: Diagnosis not present

## 2022-02-25 LAB — RAD ONC ARIA SESSION SUMMARY
Course Elapsed Days: 15
Plan Fractions Treated to Date: 12
Plan Prescribed Dose Per Fraction: 2 Gy
Plan Total Fractions Prescribed: 25
Plan Total Prescribed Dose: 50 Gy
Reference Point Dosage Given to Date: 24 Gy
Reference Point Session Dosage Given: 2 Gy
Session Number: 12

## 2022-02-26 ENCOUNTER — Ambulatory Visit: Payer: Medicare HMO

## 2022-02-27 ENCOUNTER — Ambulatory Visit: Payer: Medicare HMO

## 2022-02-28 ENCOUNTER — Ambulatory Visit: Payer: Medicare HMO

## 2022-03-03 ENCOUNTER — Other Ambulatory Visit: Payer: Self-pay

## 2022-03-03 ENCOUNTER — Ambulatory Visit
Admission: RE | Admit: 2022-03-03 | Discharge: 2022-03-03 | Disposition: A | Discharge status: Home / Self Care | Payer: Medicare HMO | Source: Ambulatory Visit | Attending: Radiation Oncology | Admitting: Radiation Oncology

## 2022-03-03 DIAGNOSIS — C44202 Unspecified malignant neoplasm of skin of right ear and external auricular canal: Secondary | ICD-10-CM | POA: Diagnosis not present

## 2022-03-03 LAB — RAD ONC ARIA SESSION SUMMARY
Course Elapsed Days: 21
Plan Fractions Treated to Date: 13
Plan Prescribed Dose Per Fraction: 2 Gy
Plan Total Fractions Prescribed: 25
Plan Total Prescribed Dose: 50 Gy
Reference Point Dosage Given to Date: 26 Gy
Reference Point Session Dosage Given: 2 Gy
Session Number: 13

## 2022-03-04 ENCOUNTER — Ambulatory Visit
Admission: RE | Admit: 2022-03-04 | Discharge: 2022-03-04 | Disposition: A | Discharge status: Home / Self Care | Payer: Medicare HMO | Source: Ambulatory Visit | Attending: Radiation Oncology | Admitting: Radiation Oncology

## 2022-03-04 ENCOUNTER — Other Ambulatory Visit: Payer: Self-pay

## 2022-03-04 DIAGNOSIS — C44202 Unspecified malignant neoplasm of skin of right ear and external auricular canal: Secondary | ICD-10-CM | POA: Diagnosis not present

## 2022-03-04 LAB — RAD ONC ARIA SESSION SUMMARY
Course Elapsed Days: 22
Plan Fractions Treated to Date: 14
Plan Prescribed Dose Per Fraction: 2 Gy
Plan Total Fractions Prescribed: 25
Plan Total Prescribed Dose: 50 Gy
Reference Point Dosage Given to Date: 28 Gy
Reference Point Session Dosage Given: 2 Gy
Session Number: 14

## 2022-03-05 ENCOUNTER — Ambulatory Visit
Admission: RE | Admit: 2022-03-05 | Discharge: 2022-03-05 | Disposition: A | Discharge status: Home / Self Care | Payer: Medicare HMO | Source: Ambulatory Visit | Attending: Radiation Oncology | Admitting: Radiation Oncology

## 2022-03-05 ENCOUNTER — Other Ambulatory Visit: Payer: Self-pay

## 2022-03-05 DIAGNOSIS — C44202 Unspecified malignant neoplasm of skin of right ear and external auricular canal: Secondary | ICD-10-CM | POA: Diagnosis not present

## 2022-03-05 DIAGNOSIS — Z510 Encounter for antineoplastic radiation therapy: Secondary | ICD-10-CM | POA: Diagnosis not present

## 2022-03-05 LAB — RAD ONC ARIA SESSION SUMMARY
Course Elapsed Days: 23
Plan Fractions Treated to Date: 15
Plan Prescribed Dose Per Fraction: 2 Gy
Plan Total Fractions Prescribed: 25
Plan Total Prescribed Dose: 50 Gy
Reference Point Dosage Given to Date: 30 Gy
Reference Point Session Dosage Given: 2 Gy
Session Number: 15

## 2022-03-06 ENCOUNTER — Other Ambulatory Visit: Payer: Self-pay

## 2022-03-06 ENCOUNTER — Ambulatory Visit
Admission: RE | Admit: 2022-03-06 | Discharge: 2022-03-06 | Disposition: A | Discharge status: Home / Self Care | Payer: Medicare HMO | Source: Ambulatory Visit | Attending: Radiation Oncology | Admitting: Radiation Oncology

## 2022-03-06 DIAGNOSIS — C44202 Unspecified malignant neoplasm of skin of right ear and external auricular canal: Secondary | ICD-10-CM | POA: Diagnosis not present

## 2022-03-06 LAB — RAD ONC ARIA SESSION SUMMARY
Course Elapsed Days: 24
Plan Fractions Treated to Date: 16
Plan Prescribed Dose Per Fraction: 2 Gy
Plan Total Fractions Prescribed: 25
Plan Total Prescribed Dose: 50 Gy
Reference Point Dosage Given to Date: 32 Gy
Reference Point Session Dosage Given: 2 Gy
Session Number: 16

## 2022-03-07 ENCOUNTER — Other Ambulatory Visit: Payer: Self-pay

## 2022-03-07 ENCOUNTER — Ambulatory Visit
Admission: RE | Admit: 2022-03-07 | Discharge: 2022-03-07 | Disposition: A | Discharge status: Home / Self Care | Payer: Medicare HMO | Source: Ambulatory Visit | Attending: Radiation Oncology | Admitting: Radiation Oncology

## 2022-03-07 DIAGNOSIS — C44202 Unspecified malignant neoplasm of skin of right ear and external auricular canal: Secondary | ICD-10-CM | POA: Diagnosis not present

## 2022-03-07 LAB — RAD ONC ARIA SESSION SUMMARY
Course Elapsed Days: 25
Plan Fractions Treated to Date: 17
Plan Prescribed Dose Per Fraction: 2 Gy
Plan Total Fractions Prescribed: 25
Plan Total Prescribed Dose: 50 Gy
Reference Point Dosage Given to Date: 34 Gy
Reference Point Session Dosage Given: 2 Gy
Session Number: 17

## 2022-03-10 ENCOUNTER — Ambulatory Visit
Admission: RE | Admit: 2022-03-10 | Discharge: 2022-03-10 | Disposition: A | Discharge status: Home / Self Care | Payer: Medicare HMO | Source: Ambulatory Visit | Attending: Radiation Oncology | Admitting: Radiation Oncology

## 2022-03-10 ENCOUNTER — Other Ambulatory Visit: Payer: Self-pay

## 2022-03-10 DIAGNOSIS — G5601 Carpal tunnel syndrome, right upper limb: Secondary | ICD-10-CM | POA: Diagnosis not present

## 2022-03-10 DIAGNOSIS — C44202 Unspecified malignant neoplasm of skin of right ear and external auricular canal: Secondary | ICD-10-CM | POA: Diagnosis not present

## 2022-03-10 LAB — RAD ONC ARIA SESSION SUMMARY
Course Elapsed Days: 28
Plan Fractions Treated to Date: 18
Plan Prescribed Dose Per Fraction: 2 Gy
Plan Total Fractions Prescribed: 25
Plan Total Prescribed Dose: 50 Gy
Reference Point Dosage Given to Date: 36 Gy
Reference Point Session Dosage Given: 2 Gy
Session Number: 18

## 2022-03-11 ENCOUNTER — Other Ambulatory Visit: Payer: Self-pay

## 2022-03-11 ENCOUNTER — Ambulatory Visit
Admission: RE | Admit: 2022-03-11 | Discharge: 2022-03-11 | Disposition: A | Discharge status: Home / Self Care | Payer: Medicare HMO | Source: Ambulatory Visit | Attending: Radiation Oncology | Admitting: Radiation Oncology

## 2022-03-11 DIAGNOSIS — C44202 Unspecified malignant neoplasm of skin of right ear and external auricular canal: Secondary | ICD-10-CM | POA: Diagnosis not present

## 2022-03-11 LAB — RAD ONC ARIA SESSION SUMMARY
Course Elapsed Days: 29
Plan Fractions Treated to Date: 19
Plan Prescribed Dose Per Fraction: 2 Gy
Plan Total Fractions Prescribed: 25
Plan Total Prescribed Dose: 50 Gy
Reference Point Dosage Given to Date: 38 Gy
Reference Point Session Dosage Given: 2 Gy
Session Number: 19

## 2022-03-12 ENCOUNTER — Ambulatory Visit: Payer: Medicare HMO

## 2022-03-12 ENCOUNTER — Other Ambulatory Visit: Payer: Self-pay

## 2022-03-12 ENCOUNTER — Ambulatory Visit
Admission: RE | Admit: 2022-03-12 | Discharge: 2022-03-12 | Disposition: A | Discharge status: Home / Self Care | Payer: Medicare HMO | Source: Ambulatory Visit | Attending: Radiation Oncology | Admitting: Radiation Oncology

## 2022-03-12 DIAGNOSIS — C44202 Unspecified malignant neoplasm of skin of right ear and external auricular canal: Secondary | ICD-10-CM | POA: Diagnosis not present

## 2022-03-12 DIAGNOSIS — R200 Anesthesia of skin: Secondary | ICD-10-CM | POA: Diagnosis not present

## 2022-03-12 DIAGNOSIS — R202 Paresthesia of skin: Secondary | ICD-10-CM | POA: Diagnosis not present

## 2022-03-12 DIAGNOSIS — Z510 Encounter for antineoplastic radiation therapy: Secondary | ICD-10-CM | POA: Diagnosis not present

## 2022-03-12 LAB — RAD ONC ARIA SESSION SUMMARY
Course Elapsed Days: 30
Plan Fractions Treated to Date: 20
Plan Prescribed Dose Per Fraction: 2 Gy
Plan Total Fractions Prescribed: 25
Plan Total Prescribed Dose: 50 Gy
Reference Point Dosage Given to Date: 40 Gy
Reference Point Session Dosage Given: 2 Gy
Session Number: 20

## 2022-03-13 ENCOUNTER — Other Ambulatory Visit: Payer: Self-pay

## 2022-03-13 ENCOUNTER — Ambulatory Visit: Payer: Medicare HMO

## 2022-03-13 ENCOUNTER — Ambulatory Visit
Admission: RE | Admit: 2022-03-13 | Discharge: 2022-03-13 | Disposition: A | Discharge status: Home / Self Care | Payer: Medicare HMO | Source: Ambulatory Visit | Attending: Radiation Oncology | Admitting: Radiation Oncology

## 2022-03-13 DIAGNOSIS — C44202 Unspecified malignant neoplasm of skin of right ear and external auricular canal: Secondary | ICD-10-CM | POA: Diagnosis not present

## 2022-03-13 LAB — RAD ONC ARIA SESSION SUMMARY
Course Elapsed Days: 31
Plan Fractions Treated to Date: 21
Plan Prescribed Dose Per Fraction: 2 Gy
Plan Total Fractions Prescribed: 25
Plan Total Prescribed Dose: 50 Gy
Reference Point Dosage Given to Date: 42 Gy
Reference Point Session Dosage Given: 2 Gy
Session Number: 21

## 2022-03-14 ENCOUNTER — Ambulatory Visit: Payer: Medicare HMO

## 2022-03-14 ENCOUNTER — Other Ambulatory Visit: Payer: Self-pay

## 2022-03-14 ENCOUNTER — Ambulatory Visit
Admission: RE | Admit: 2022-03-14 | Discharge: 2022-03-14 | Disposition: A | Discharge status: Home / Self Care | Payer: Medicare HMO | Source: Ambulatory Visit | Attending: Radiation Oncology | Admitting: Radiation Oncology

## 2022-03-14 DIAGNOSIS — C44202 Unspecified malignant neoplasm of skin of right ear and external auricular canal: Secondary | ICD-10-CM | POA: Diagnosis not present

## 2022-03-14 LAB — RAD ONC ARIA SESSION SUMMARY
Course Elapsed Days: 32
Plan Fractions Treated to Date: 22
Plan Prescribed Dose Per Fraction: 2 Gy
Plan Total Fractions Prescribed: 25
Plan Total Prescribed Dose: 50 Gy
Reference Point Dosage Given to Date: 44 Gy
Reference Point Session Dosage Given: 2 Gy
Session Number: 22

## 2022-03-17 ENCOUNTER — Other Ambulatory Visit: Payer: Self-pay

## 2022-03-17 ENCOUNTER — Ambulatory Visit
Admission: RE | Admit: 2022-03-17 | Discharge: 2022-03-17 | Disposition: A | Discharge status: Home / Self Care | Payer: Medicare HMO | Source: Ambulatory Visit | Attending: Radiation Oncology | Admitting: Radiation Oncology

## 2022-03-17 DIAGNOSIS — C44202 Unspecified malignant neoplasm of skin of right ear and external auricular canal: Secondary | ICD-10-CM | POA: Diagnosis not present

## 2022-03-17 LAB — RAD ONC ARIA SESSION SUMMARY
Course Elapsed Days: 35
Plan Fractions Treated to Date: 23
Plan Prescribed Dose Per Fraction: 2 Gy
Plan Total Fractions Prescribed: 25
Plan Total Prescribed Dose: 50 Gy
Reference Point Dosage Given to Date: 46 Gy
Reference Point Session Dosage Given: 2 Gy
Session Number: 23

## 2022-03-18 ENCOUNTER — Other Ambulatory Visit: Payer: Self-pay

## 2022-03-18 ENCOUNTER — Ambulatory Visit
Admission: RE | Admit: 2022-03-18 | Discharge: 2022-03-18 | Disposition: A | Discharge status: Home / Self Care | Payer: Medicare HMO | Source: Ambulatory Visit | Attending: Radiation Oncology | Admitting: Radiation Oncology

## 2022-03-18 DIAGNOSIS — C44202 Unspecified malignant neoplasm of skin of right ear and external auricular canal: Secondary | ICD-10-CM | POA: Diagnosis not present

## 2022-03-18 LAB — RAD ONC ARIA SESSION SUMMARY
Course Elapsed Days: 36
Plan Fractions Treated to Date: 24
Plan Prescribed Dose Per Fraction: 2 Gy
Plan Total Fractions Prescribed: 25
Plan Total Prescribed Dose: 50 Gy
Reference Point Dosage Given to Date: 48 Gy
Reference Point Session Dosage Given: 2 Gy
Session Number: 24

## 2022-03-19 ENCOUNTER — Ambulatory Visit
Admission: RE | Admit: 2022-03-19 | Discharge: 2022-03-19 | Disposition: A | Discharge status: Home / Self Care | Payer: Medicare HMO | Source: Ambulatory Visit | Attending: Radiation Oncology | Admitting: Radiation Oncology

## 2022-03-19 ENCOUNTER — Other Ambulatory Visit: Payer: Self-pay

## 2022-03-19 DIAGNOSIS — Z510 Encounter for antineoplastic radiation therapy: Secondary | ICD-10-CM | POA: Diagnosis not present

## 2022-03-19 DIAGNOSIS — C44202 Unspecified malignant neoplasm of skin of right ear and external auricular canal: Secondary | ICD-10-CM | POA: Diagnosis not present

## 2022-03-19 LAB — RAD ONC ARIA SESSION SUMMARY
Course Elapsed Days: 37
Plan Fractions Treated to Date: 25
Plan Prescribed Dose Per Fraction: 2 Gy
Plan Total Fractions Prescribed: 25
Plan Total Prescribed Dose: 50 Gy
Reference Point Dosage Given to Date: 50 Gy
Reference Point Session Dosage Given: 2 Gy
Session Number: 25

## 2022-04-04 ENCOUNTER — Encounter: Payer: Self-pay | Admitting: *Deleted

## 2022-04-09 ENCOUNTER — Encounter: Payer: Self-pay | Admitting: Internal Medicine

## 2022-04-09 ENCOUNTER — Ambulatory Visit (INDEPENDENT_AMBULATORY_CARE_PROVIDER_SITE_OTHER): Payer: Medicare HMO | Admitting: Internal Medicine

## 2022-04-09 VITALS — BP 128/62 | HR 66 | Ht 74.0 in | Wt 172.6 lb

## 2022-04-09 DIAGNOSIS — E1169 Type 2 diabetes mellitus with other specified complication: Secondary | ICD-10-CM | POA: Diagnosis not present

## 2022-04-09 DIAGNOSIS — E118 Type 2 diabetes mellitus with unspecified complications: Secondary | ICD-10-CM | POA: Diagnosis not present

## 2022-04-09 DIAGNOSIS — H1132 Conjunctival hemorrhage, left eye: Secondary | ICD-10-CM

## 2022-04-09 DIAGNOSIS — I4819 Other persistent atrial fibrillation: Secondary | ICD-10-CM

## 2022-04-09 DIAGNOSIS — E1122 Type 2 diabetes mellitus with diabetic chronic kidney disease: Secondary | ICD-10-CM

## 2022-04-09 DIAGNOSIS — N183 Chronic kidney disease, stage 3 unspecified: Secondary | ICD-10-CM

## 2022-04-09 DIAGNOSIS — E785 Hyperlipidemia, unspecified: Secondary | ICD-10-CM

## 2022-04-09 DIAGNOSIS — D509 Iron deficiency anemia, unspecified: Secondary | ICD-10-CM | POA: Diagnosis not present

## 2022-04-09 DIAGNOSIS — I10 Essential (primary) hypertension: Secondary | ICD-10-CM

## 2022-04-09 DIAGNOSIS — K8689 Other specified diseases of pancreas: Secondary | ICD-10-CM | POA: Diagnosis not present

## 2022-04-09 DIAGNOSIS — K2100 Gastro-esophageal reflux disease with esophagitis, without bleeding: Secondary | ICD-10-CM

## 2022-04-09 DIAGNOSIS — Z0000 Encounter for general adult medical examination without abnormal findings: Secondary | ICD-10-CM

## 2022-04-09 NOTE — Assessment & Plan Note (Addendum)
Clinically stable without s/s of hypoglycemia. Tolerating metformin well without side effects or other concerns. Resumed bid dosing when glucoses increased. Lab Results  Component Value Date   HGBA1C 6.5 (H) 10/07/2021

## 2022-04-09 NOTE — Assessment & Plan Note (Signed)
Symptoms well controlled on daily PPI No red flag signs such as weight loss, n/v, melena  

## 2022-04-09 NOTE — Assessment & Plan Note (Addendum)
Followed by Cardiology - afib eliminated by Watchman procedure Not on anticoagulation other than ASA 81 mg

## 2022-04-09 NOTE — Assessment & Plan Note (Signed)
Tolerating statin medications without concerns LDL is  Lab Results  Component Value Date   LDLCALC 61 02/19/2021  On Crestor 20 mg - with a goal of < 70. Current dose will be adjusted if needed.

## 2022-04-09 NOTE — Progress Notes (Signed)
Date:  04/09/2022   Name:  Maxwell Stanley   DOB:  Sep 06, 1934   MRN:  [id number]   Chief Complaint: Annual Exam Maxwell Stanley is a 88 y.o. male who presents today for his Complete Annual Exam. He feels fairly well. He reports exercising some. He reports he is sleeping fairly well. His weight loss has stabilized.  He does not have the appetite he used to.  Colonoscopy: 05/2021 aged out  Immunization History  Administered Date(s) Administered   Covid-19, Mrna,Vaccine(Spikevax)75yrs and older 11/09/2021   Fluad Quad(high Dose 65+) 09/16/2018, 09/27/2019, 10/05/2020   Influenza, High Dose Seasonal PF 10/13/2016, 09/09/2017, 11/09/2021   Influenza,inj,Quad PF,6+ Mos 09/26/2014   Influenza-Unspecified 09/26/2014, 10/13/2016   PFIZER(Purple Top)SARS-COV-2 Vaccination 01/22/2019, 02/13/2019, 10/20/2019, 04/07/2020   Pfizer Covid-19 Vaccine Bivalent Booster 8yrs & up 10/05/2020   Pneumococcal Conjugate-13 10/26/2013   Pneumococcal Polysaccharide-23 01/31/2011   Respiratory Syncytial Virus Vaccine,Recomb Aduvanted(Arexvy) 11/25/2021   Tdap 10/03/2010   Zoster Recombinat (Shingrix) 08/12/2017, 10/22/2017   Zoster, Live 04/16/2000   Health Maintenance Due  Topic Date Due   DTaP/Tdap/Td (2 - Td or Tdap) 10/02/2020   OPHTHALMOLOGY EXAM  12/18/2021   COVID-19 Vaccine (7 - 2023-24 season) 01/04/2022   Diabetic kidney evaluation - Urine ACR  02/19/2022   HEMOGLOBIN A1C  04/08/2022    Lab Results  Component Value Date   PSA1 0.7 02/15/2020   PSA1 0.6 02/02/2019   PSA1 0.9 12/22/2017   PSA 1.83 04/10/2017     Hypertension This is a chronic problem. The problem is controlled. Pertinent negatives include no chest pain, headaches, palpitations or shortness of breath. Past treatments include beta blockers, calcium channel blockers and angiotensin blockers. The current treatment provides significant improvement. Hypertensive end-organ damage includes kidney disease. There is no history  of CAD/MI or CVA.  Hyperlipidemia This is a chronic problem. The problem is controlled. Pertinent negatives include no chest pain, myalgias or shortness of breath. Current antihyperlipidemic treatment includes statins. The current treatment provides significant improvement of lipids.  Gastroesophageal Reflux He complains of heartburn. He reports no abdominal pain, no chest pain, no choking or no wheezing. This is a recurrent problem. The problem occurs rarely. The problem has been unchanged. Pertinent negatives include no fatigue. He has tried a PPI for the symptoms.    Lab Results  Component Value Date   NA 138 10/07/2021   K 4.8 10/07/2021   CO2 21 10/07/2021   GLUCOSE 126 (H) 10/07/2021   BUN 21 10/07/2021   CREATININE 1.33 (H) 10/07/2021   CALCIUM 9.2 10/07/2021   EGFR 53 (L) 10/07/2021   GFRNONAA >60 04/04/2020   Lab Results  Component Value Date   CHOL 123 02/19/2021   HDL 39 (L) 02/19/2021   LDLCALC 61 02/19/2021   TRIG 127 02/19/2021   CHOLHDL 3.2 02/19/2021   Lab Results  Component Value Date   TSH 2.500 02/19/2021   Lab Results  Component Value Date   HGBA1C 6.5 (H) 10/07/2021   Lab Results  Component Value Date   WBC 6.5 10/07/2021   HGB 12.0 (L) 10/07/2021   HCT 36.0 (L) 10/07/2021   MCV 91 10/07/2021   PLT 236 10/07/2021   Lab Results  Component Value Date   ALT 16 10/07/2021   AST 25 10/07/2021   ALKPHOS 92 10/07/2021   BILITOT 0.3 10/07/2021   No results found for: "25OHVITD2", "25OHVITD3", "VD25OH"   Review of Systems  Constitutional:  Positive for appetite change (weight loss stablized).  Negative for chills, diaphoresis, fatigue and unexpected weight change.  HENT:  Negative for hearing loss, tinnitus and trouble swallowing.   Eyes:  Negative for visual disturbance.  Respiratory:  Negative for choking, shortness of breath and wheezing.   Cardiovascular:  Negative for chest pain, palpitations and leg swelling.  Gastrointestinal:  Positive for  heartburn. Negative for abdominal pain, blood in stool, constipation and diarrhea.  Genitourinary:  Negative for difficulty urinating, dysuria and frequency.  Musculoskeletal:  Positive for arthralgias. Negative for back pain and myalgias.  Skin:  Positive for wound (skin cancer site right ear). Negative for color change and rash.  Neurological:  Positive for numbness (and coldness in feet). Negative for dizziness, syncope and headaches.  Hematological:  Negative for adenopathy.  Psychiatric/Behavioral:  Negative for dysphoric mood and sleep disturbance. The patient is not nervous/anxious.     Patient Active Problem List   Diagnosis Date Noted   Hx of total hip arthroplasty, right 10/21/2021   Bilateral carotid artery stenosis 05/22/2021   Anemia 02/28/2021   Total knee replacement status 04/16/2020   Primary osteoarthritis of both knees 05/19/2019   Gastroesophageal reflux disease with esophagitis 02/02/2019   Personal history of other malignant neoplasm of skin 12/17/2018   Persistent atrial fibrillation 10/29/2018   Reducible right inguinal hernia 09/16/2018   DDD (degenerative disc disease), lumbar 12/23/2017   Onychomycosis of toenail 08/19/2017   Allergic rhinitis 08/08/2016   Carpal tunnel syndrome on left 12/12/2015   Chronic maxillary sinusitis 04/09/2015   Type II diabetes mellitus with complication 123XX123   Chronic low back pain 09/21/2014   Abnormal prostate specific antigen 04/17/2014   Enlarged prostate with lower urinary tract symptoms (LUTS) 04/17/2014   CKD stage 3 secondary to diabetes 04/17/2014   Hyperlipidemia associated with type 2 diabetes mellitus 04/17/2014   ED (erectile dysfunction) of organic origin 04/17/2014   Essential (primary) hypertension 04/17/2014   Back muscle spasm 04/17/2014   Degenerative arthritis of finger 04/17/2014   Breast development in males 04/17/2014   Leg varices 04/17/2014    Allergies  Allergen Reactions   Codeine Other  (See Comments)    Made his "Wild"   Ceftriaxone Rash   Penicillin G Rash    Did it involve swelling of the face/tongue/throat, SOB, or low BP? No Did it involve sudden or severe rash/hives, skin peeling, or any reaction on the inside of your mouth or nose? No Did you need to seek medical attention at a hospital or doctor's office? No When did it last happen?       If all above answers are "NO", may proceed with cephalosporin use.  IgE = 35 (WNL) on 01/18/2020     Past Surgical History:  Procedure Laterality Date   ANKLE ARTHROSCOPY WITH OPEN REDUCTION INTERNAL FIXATION (ORIF) Left 1998   COLONOSCOPY     COLONOSCOPY WITH PROPOFOL N/A 05/28/2021   Procedure: COLONOSCOPY WITH PROPOFOL;  Surgeon: Lucilla Lame, MD;  Location: [facility] ENDOSCOPY;  Service: Endoscopy;  Laterality: N/A;   ESOPHAGOGASTRODUODENOSCOPY (EGD) WITH PROPOFOL N/A 05/28/2021   Procedure: ESOPHAGOGASTRODUODENOSCOPY (EGD) WITH PROPOFOL;  Surgeon: Lucilla Lame, MD;  Location: [facility] ENDOSCOPY;  Service: Endoscopy;  Laterality: N/A;   ETHMOIDECTOMY Left 03/08/2020   Procedure: TOTAL ETHMOIDECTOMY;  Surgeon: Margaretha Sheffield, MD;  Location: [place];  Service: ENT;  Laterality: Left;   FRONTAL SINUS EXPLORATION Left 03/08/2020   Procedure: FRONTAL SINUS EXPLORATION;  Surgeon: Margaretha Sheffield, MD;  Location: [place];  Service: ENT;  Laterality: Left;  GIVENS CAPSULE STUDY N/A 07/11/2021   Procedure: GIVENS CAPSULE STUDY;  Surgeon: Lucilla Lame, MD;  Location: [facility] ENDOSCOPY;  Service: Endoscopy;  Laterality: N/A;   HEMORRHOID SURGERY     IMAGE GUIDED SINUS SURGERY N/A 03/08/2020   Procedure: IMAGE GUIDED SINUS SURGERY;  Surgeon: Margaretha Sheffield, MD;  Location: [place];  Service: ENT;  Laterality: N/A;  placed disk on OR charge nurse desk 2-21 kp PLACED 2ND DISK ON OR CHARGE NURSE DESK 02-28-20 KP 3RD DISK ON OR CHARGE NURSE DESK 2-25- [place] Right 11/12/2018   Procedure: HERNIA  REPAIR INGUINAL ADULT;  Surgeon: Fredirick Maudlin, MD;  Location: [facility] ORS;  Service: General;  Laterality: Right;   INGUINAL HERNIA REPAIR Left 2012   KNEE ARTHROPLASTY Right 04/16/2020   Procedure: COMPUTER ASSISTED TOTAL KNEE ARTHROPLASTY;  Surgeon: Dereck Leep, MD;  Location: [facility] ORS;  Service: Orthopedics;  Laterality: Right;   KNEE ARTHROSCOPY Right 2006   MAXILLARY ANTROSTOMY Left 03/08/2020   Procedure: MAXILLARY ANTROSTOMY WITH TISSUE REMOVAL;  Surgeon: Margaretha Sheffield, MD;  Location: [place];  Service: ENT;  Laterality: Left;   SEPTOPLASTY N/A 03/08/2020   Procedure: SEPTOPLASTY;  Surgeon: Margaretha Sheffield, MD;  Location: [place];  Service: ENT;  Laterality: N/A;   skin cancer resection Right 04/07/2018   ON hand.   THUMB ARTHROSCOPY Right 2013   tendon flap to joint   TONSILLECTOMY     TOTAL HIP ARTHROPLASTY Right 10/21/2021   Procedure: TOTAL HIP ARTHROPLASTY;  Surgeon: Dereck Leep, MD;  Location: [facility] ORS;  Service: Orthopedics;  Laterality: Right;   UMBILICAL HERNIA REPAIR Left 2012   UPPER GI ENDOSCOPY      Social History   Tobacco Use   Smoking status: Never   Smokeless tobacco: Never  Vaping Use   Vaping Use: Never used  Substance Use Topics   Alcohol use: No    Alcohol/week: 0.0 standard drinks of alcohol   Drug use: No     Medication list has been reviewed and updated.  Current Meds  Medication Sig   acetaminophen (TYLENOL) 500 MG tablet Take 1,000 mg by mouth 2 (two) times daily.   amLODipine (NORVASC) 2.5 MG tablet TAKE 1 TABLET DAILY   aspirin EC 81 MG tablet Take 81 mg by mouth daily. Swallow whole.   atenolol (TENORMIN) 50 MG tablet TAKE 1 TABLET AT BEDTIME   cholecalciferol (VITAMIN D) 1000 UNITS tablet Take 1,000 Units by mouth daily.   clopidogrel (PLAVIX) 75 MG tablet Take 75 mg by mouth daily.   docusate sodium (COLACE) 100 MG capsule Take 200 mg by mouth daily.    ferrous sulfate 325 (65 FE) MG EC tablet Take 1  tablet by mouth daily.   finasteride (PROSCAR) 5 MG tablet TAKE 1 TABLET DAILY   fluticasone (FLONASE) 50 MCG/ACT nasal spray Place 2 sprays into both nostrils daily. (Patient taking differently: Place 2 sprays into both nostrils as needed for allergies.)   glucose blood (ONETOUCH ULTRA) test strip Use as instructed   losartan (COZAAR) 100 MG tablet Take 0.5 tablets (50 mg total) by mouth 2 (two) times daily.   Melatonin 10 MG CAPS Take 10 mg by mouth at bedtime.   metFORMIN (GLUCOPHAGE-XR) 500 MG 24 hr tablet Take 1 tablet (500 mg total) by mouth 2 (two) times daily with a meal. (Patient taking differently: Take 500 mg by mouth daily with breakfast.)   Multiple Vitamin (MULTIVITAMIN WITH MINERALS) TABS tablet Take 1  tablet by mouth daily.   OneTouch Delica Lancets 99991111 MISC 1 each by Does not apply route daily. Use to test blood sugar once daily.   pantoprazole (PROTONIX) 40 MG tablet Take 1 tablet (40 mg total) by mouth 2 (two) times daily.   Probiotic Product (PROBIOTIC DAILY PO) Take 1 tablet by mouth daily.   rosuvastatin (CRESTOR) 20 MG tablet TAKE 1 TABLET AT BEDTIME   scopolamine (TRANSDERM SCOP, 1.5 MG,) 1 MG/3DAYS Place 1 patch onto the skin See admin instructions. PRN for deep see fishing   sitaGLIPtin (JANUVIA) 100 MG tablet TAKE 1 TABLET DAILY   tamsulosin (FLOMAX) 0.4 MG CAPS capsule TAKE 1 CAPSULE DAILY AFTER SUPPER       04/09/2022    9:00 AM 06/20/2021    8:02 AM 02/28/2021   10:43 AM 02/19/2021    9:03 AM  GAD 7 : Generalized Anxiety Score  Nervous, Anxious, on Edge 0 0 0 0  Control/stop worrying 0 0 0 0  Worry too much - different things 0 0 0 0  Trouble relaxing 0 0 0 0  Restless 0 0 0 0  Easily annoyed or irritable 0 0 0 0  Afraid - awful might happen 0 0 0 0  Total GAD 7 Score 0 0 0 0  Anxiety Difficulty Not difficult at all Not difficult at all         04/09/2022    9:00 AM 01/10/2022   10:05 AM 06/20/2021    8:02 AM  Depression screen PHQ 2/9  Decreased Interest  0 0 0  Down, Depressed, Hopeless 0 0 0  PHQ - 2 Score 0 0 0  Altered sleeping 0  0  Tired, decreased energy 0  0  Change in appetite 0  2  Feeling bad or failure about yourself  0  0  Trouble concentrating 0  0  Moving slowly or fidgety/restless 0  0  Suicidal thoughts 0  0  PHQ-9 Score 0  2  Difficult doing work/chores Not difficult at all  Not difficult at all    BP Readings from Last 3 Encounters:  04/09/22 128/62  01/24/22 (!) 176/90  10/22/21 128/66    Physical Exam Vitals and nursing note reviewed.  Constitutional:      Appearance: Normal appearance. He is well-developed.  HENT:     Head: Normocephalic.     Right Ear: Tympanic membrane and ear canal normal.     Left Ear: Tympanic membrane, ear canal and external ear normal.     Nose: Nose normal.  Eyes:     Conjunctiva/sclera:     Left eye: Hemorrhage present.     Pupils: Pupils are equal, round, and reactive to light.  Neck:     Thyroid: No thyroid mass or thyromegaly.     Vascular: No carotid bruit.  Cardiovascular:     Rate and Rhythm: Normal rate and regular rhythm.     Pulses:          Dorsalis pedis pulses are 1+ on the right side and 1+ on the left side.     Heart sounds: Normal heart sounds.  Pulmonary:     Effort: Pulmonary effort is normal.     Breath sounds: Normal breath sounds. No wheezing.  Chest:  Breasts:    Right: No mass.     Left: No mass.  Abdominal:     General: Abdomen is flat and scaphoid. Bowel sounds are normal.     Palpations: Abdomen is  soft.     Tenderness: There is no abdominal tenderness.  Musculoskeletal:     Cervical back: Normal range of motion and neck supple.     Left knee: Bony tenderness present. Decreased range of motion.     Right lower leg: No edema.     Left lower leg: No edema.  Lymphadenopathy:     Cervical: No cervical adenopathy.  Skin:    General: Skin is warm and dry.  Neurological:     Mental Status: He is alert and oriented to person, place, and time.      Deep Tendon Reflexes: Reflexes are normal and symmetric.  Psychiatric:        Attention and Perception: Attention normal.        Mood and Affect: Mood normal.        Thought Content: Thought content normal.    Diabetic Foot Exam - Simple   Simple Foot Form Diabetic Foot exam was performed with the following findings: Yes 04/09/2022  9:22 AM  Visual Inspection No deformities, no ulcerations, no other skin breakdown bilaterally: Yes Sensation Testing See comments: Yes Pulse Check Posterior Tibialis and Dorsalis pulse intact bilaterally: Yes Comments Decreased sensation both distal feet      Wt Readings from Last 3 Encounters:  04/09/22 172 lb 9.6 oz (78.3 kg)  01/24/22 167 lb (75.8 kg)  10/21/21 174 lb 2.6 oz (79 kg)    BP 128/62 (BP Location: Left Arm, Cuff Size: Normal)   Pulse 66   Ht 6\' 2"  (1.88 m)   Wt 172 lb 9.6 oz (78.3 kg)   SpO2 98%   BMI 22.16 kg/m   Assessment and Plan:  Problem List Items Addressed This Visit       Cardiovascular and Mediastinum   Essential (primary) hypertension (Chronic)    Clinically stable exam with well controlled BP on amlodipine, losartan and atenolol. Tolerating medications without side effects. Pt to continue current regimen and low sodium diet.       Relevant Orders   CBC with Differential/Platelet   Persistent atrial fibrillation (Chronic)    Followed by Cardiology - afib eliminated by Watchman procedure Not on anticoagulation other than ASA 81 mg        Digestive   Gastroesophageal reflux disease with esophagitis (Chronic)    Symptoms well controlled on daily PPI No red flag signs such as weight loss, n/v, melena       Relevant Orders   CBC with Differential/Platelet     Endocrine   CKD stage 3 secondary to diabetes (Chronic)    CKD stable with GFR in the 53's Seen by Nephrology in 2019 with no intervention - released to follow up with PCP      Relevant Orders   Comprehensive metabolic panel    Hyperlipidemia associated with type 2 diabetes mellitus (Chronic)    Tolerating statin medications without concerns LDL is  Lab Results  Component Value Date   LDLCALC 61 02/19/2021  On Crestor 20 mg - with a goal of < 70. Current dose will be adjusted if needed.       Relevant Orders   Lipid panel   Type II diabetes mellitus with complication (Chronic)    Clinically stable without s/s of hypoglycemia. Tolerating metformin well without side effects or other concerns. Resumed bid dosing when glucoses increased. Lab Results  Component Value Date   HGBA1C 6.5 (H) 10/07/2021        Relevant Orders   Comprehensive metabolic panel  Hemoglobin A1c   Microalbumin / creatinine urine ratio     Other   Anemia   Relevant Orders   CBC with Differential/Platelet   Iron, TIBC and Ferritin Panel   Other Visit Diagnoses     Annual physical exam    -  Primary   Pancreatic mass       Relevant Orders   MR ABDOMEN WITH MRCP W CONTRAST   Scleral hemorrhage of left eye       likely due to minor trauma (unknown) should resolve without complications       Return in about 4 months (around 08/09/2022) for DM, HTN.   Partially dictated using [place], any errors are not intentional.  Glean Hess, MD [place], [place]

## 2022-04-09 NOTE — Assessment & Plan Note (Signed)
Clinically stable exam with well controlled BP on amlodipine, losartan and atenolol. Tolerating medications without side effects. Pt to continue current regimen and low sodium diet.

## 2022-04-09 NOTE — Assessment & Plan Note (Signed)
CKD stable with GFR in the 53's Seen by Nephrology in 2019 with no intervention - released to follow up with PCP

## 2022-04-10 DIAGNOSIS — I700 Atherosclerosis of aorta: Secondary | ICD-10-CM | POA: Diagnosis not present

## 2022-04-10 DIAGNOSIS — G5601 Carpal tunnel syndrome, right upper limb: Secondary | ICD-10-CM | POA: Diagnosis not present

## 2022-04-10 LAB — LIPID PANEL
Chol/HDL Ratio: 3.5 ratio (ref 0.0–5.0)
Cholesterol, Total: 138 mg/dL (ref 100–199)
HDL: 40 mg/dL (ref >39)
LDL Chol Calc (NIH): 76 mg/dL (ref 0–99)
Triglycerides: 119 mg/dL (ref 0–149)
VLDL Cholesterol Cal: 22 mg/dL (ref 5–40)

## 2022-04-10 LAB — CBC WITH DIFFERENTIAL/PLATELET
Basophils Absolute: 0.1 10*3/uL (ref 0.0–0.2)
Basos: 1 %
EOS (ABSOLUTE): 0.4 10*3/uL (ref 0.0–0.4)
Eos: 6 %
Hematocrit: 39.4 % (ref 37.5–51.0)
Hemoglobin: 13.8 g/dL (ref 13.0–17.7)
Immature Grans (Abs): 0 10*3/uL (ref 0.0–0.1)
Immature Granulocytes: 0 %
Lymphocytes Absolute: 2.2 10*3/uL (ref 0.7–3.1)
Lymphs: 33 %
MCH: 32.8 pg (ref 26.6–33.0)
MCHC: 35 g/dL (ref 31.5–35.7)
MCV: 94 fL (ref 79–97)
Monocytes Absolute: 0.7 10*3/uL (ref 0.1–0.9)
Monocytes: 10 %
Neutrophils Absolute: 3.3 10*3/uL (ref 1.4–7.0)
Neutrophils: 50 %
Platelets: 214 10*3/uL (ref 150–450)
RBC: 4.21 x10E6/uL (ref 4.14–5.80)
RDW: 13.5 % (ref 11.6–15.4)
WBC: 6.6 10*3/uL (ref 3.4–10.8)

## 2022-04-10 LAB — MICROALBUMIN / CREATININE URINE RATIO
Creatinine, Urine: 85.6 mg/dL
Microalb/Creat Ratio: 18 mg/g creat (ref 0–29)
Microalbumin, Urine: 15.1 ug/mL

## 2022-04-10 LAB — COMPREHENSIVE METABOLIC PANEL
ALT: 19 IU/L (ref 0–44)
AST: 27 IU/L (ref 0–40)
Albumin/Globulin Ratio: 2.1 (ref 1.2–2.2)
Albumin: 4.5 g/dL (ref 3.7–4.7)
Alkaline Phosphatase: 75 IU/L (ref 44–121)
BUN/Creatinine Ratio: 14 (ref 10–24)
BUN: 19 mg/dL (ref 8–27)
Bilirubin Total: 0.6 mg/dL (ref 0.0–1.2)
CO2: 23 mmol/L (ref 20–29)
Calcium: 9.7 mg/dL (ref 8.6–10.2)
Chloride: 102 mmol/L (ref 96–106)
Creatinine, Ser: 1.32 mg/dL — ABNORMAL HIGH (ref 0.76–1.27)
Globulin, Total: 2.1 g/dL (ref 1.5–4.5)
Glucose: 145 mg/dL — ABNORMAL HIGH (ref 70–99)
Potassium: 4.3 mmol/L (ref 3.5–5.2)
Sodium: 140 mmol/L (ref 134–144)
Total Protein: 6.6 g/dL (ref 6.0–8.5)
eGFR: 53 mL/min/{1.73_m2} — ABNORMAL LOW (ref >59)

## 2022-04-10 LAB — HEMOGLOBIN A1C
Est. average glucose Bld gHb Est-mCnc: 151 mg/dL
Hgb A1c MFr Bld: 6.9 % — ABNORMAL HIGH (ref 4.8–5.6)

## 2022-04-10 LAB — IRON,TIBC AND FERRITIN PANEL
Ferritin: 50 ng/mL (ref 30–400)
Iron Saturation: 38 % (ref 15–55)
Iron: 108 ug/dL (ref 38–169)
Total Iron Binding Capacity: 281 ug/dL (ref 250–450)
UIBC: 173 ug/dL (ref 111–343)

## 2022-04-11 ENCOUNTER — Encounter: Payer: Self-pay | Admitting: Internal Medicine

## 2022-04-14 NOTE — Progress Notes (Signed)
PC to pt, pt viewed results online. Will call with questions or concerns.

## 2022-04-15 ENCOUNTER — Other Ambulatory Visit: Payer: Self-pay | Admitting: Internal Medicine

## 2022-04-15 DIAGNOSIS — K8689 Other specified diseases of pancreas: Secondary | ICD-10-CM

## 2022-04-16 ENCOUNTER — Ambulatory Visit
Admission: RE | Admit: 2022-04-16 | Discharge: 2022-04-16 | Disposition: A | Discharge status: Home / Self Care | Payer: Medicare HMO | Source: Ambulatory Visit | Attending: Internal Medicine | Admitting: Internal Medicine

## 2022-04-16 DIAGNOSIS — K862 Cyst of pancreas: Secondary | ICD-10-CM | POA: Diagnosis not present

## 2022-04-16 DIAGNOSIS — K8689 Other specified diseases of pancreas: Secondary | ICD-10-CM | POA: Diagnosis not present

## 2022-04-16 MED ORDER — GADOBUTROL 1 MMOL/ML IV SOLN
7.5000 mL | Freq: Once | INTRAVENOUS | Status: AC | PRN
  Administered 2022-04-16: 7.5 mL via INTRAVENOUS

## 2022-04-17 ENCOUNTER — Telehealth: Payer: Self-pay | Admitting: Internal Medicine

## 2022-04-17 ENCOUNTER — Encounter: Payer: Self-pay | Admitting: Internal Medicine

## 2022-04-17 DIAGNOSIS — D490 Neoplasm of unspecified behavior of digestive system: Secondary | ICD-10-CM | POA: Insufficient documentation

## 2022-04-17 NOTE — Telephone Encounter (Signed)
Copied from CRM [id number]. Topic: General - Other >> Apr 17, 2022  2:31 PM Franchot Heidelberg wrote: Reason for CRM: Pt called requesting to speak to [facility] regarding information he needs to record regarding his MRI. Please advise

## 2022-04-17 NOTE — Telephone Encounter (Signed)
Called pt went over MRI to let him know what kind of cyst he had (hypertense cystic foci). Pt verbalized understanding.  KP

## 2022-04-19 ENCOUNTER — Other Ambulatory Visit: Payer: Self-pay | Admitting: Internal Medicine

## 2022-04-19 DIAGNOSIS — E118 Type 2 diabetes mellitus with unspecified complications: Secondary | ICD-10-CM

## 2022-04-19 DIAGNOSIS — E1169 Type 2 diabetes mellitus with other specified complication: Secondary | ICD-10-CM

## 2022-04-19 DIAGNOSIS — I10 Essential (primary) hypertension: Secondary | ICD-10-CM

## 2022-04-21 ENCOUNTER — Ambulatory Visit: Payer: Medicare HMO | Admitting: Radiation Oncology

## 2022-04-29 ENCOUNTER — Ambulatory Visit: Payer: Self-pay

## 2022-04-29 ENCOUNTER — Encounter: Payer: Self-pay | Admitting: Internal Medicine

## 2022-04-29 ENCOUNTER — Ambulatory Visit (INDEPENDENT_AMBULATORY_CARE_PROVIDER_SITE_OTHER): Payer: Medicare HMO | Admitting: Internal Medicine

## 2022-04-29 VITALS — BP 134/80 | HR 76 | Ht 74.0 in | Wt 175.4 lb

## 2022-04-29 DIAGNOSIS — B029 Zoster without complications: Secondary | ICD-10-CM | POA: Diagnosis not present

## 2022-04-29 MED ORDER — VALACYCLOVIR HCL 1 G PO TABS
1000.0000 mg | ORAL_TABLET | Freq: Three times a day (TID) | ORAL | 0 refills | Status: AC
Start: 2022-04-29 — End: 2022-05-09

## 2022-04-29 MED ORDER — GABAPENTIN 100 MG PO CAPS
100.0000 mg | ORAL_CAPSULE | Freq: Every day | ORAL | 0 refills | Status: DC
Start: 2022-04-29 — End: 2022-05-09

## 2022-04-29 NOTE — Progress Notes (Signed)
Date:  04/29/2022   Name:  Maxwell Stanley   DOB:  [birth year]   MRN:  [id number]   Chief Complaint: Rash  Rash This is a new problem. Episode onset: 3 days ago. The problem has been gradually worsening since onset. The affected locations include the chest and left shoulder. He was exposed to nothing. Pertinent negatives include no fatigue, fever, joint pain or shortness of breath. Treatments tried: heat. The treatment provided mild relief.    Lab Results  Component Value Date   NA 140 04/09/2022   K 4.3 04/09/2022   CO2 23 04/09/2022   GLUCOSE 145 (H) 04/09/2022   BUN 19 04/09/2022   CREATININE 1.32 (H) 04/09/2022   CALCIUM 9.7 04/09/2022   EGFR 53 (L) 04/09/2022   GFRNONAA >60 04/04/2020   Lab Results  Component Value Date   CHOL 138 04/09/2022   HDL 40 04/09/2022   LDLCALC 76 04/09/2022   TRIG 119 04/09/2022   CHOLHDL 3.5 04/09/2022   Lab Results  Component Value Date   TSH 2.500 02/19/2021   Lab Results  Component Value Date   HGBA1C 6.9 (H) 04/09/2022   Lab Results  Component Value Date   WBC 6.6 04/09/2022   HGB 13.8 04/09/2022   HCT 39.4 04/09/2022   MCV 94 04/09/2022   PLT 214 04/09/2022   Lab Results  Component Value Date   ALT 19 04/09/2022   AST 27 04/09/2022   ALKPHOS 75 04/09/2022   BILITOT 0.6 04/09/2022   No results found for: "25OHVITD2", "25OHVITD3", "VD25OH"   Review of Systems  Constitutional:  Negative for chills, diaphoresis, fatigue and fever.  Respiratory:  Negative for chest tightness and shortness of breath.   Musculoskeletal:  Positive for arthralgias, neck pain and neck stiffness. Negative for joint pain.  Skin:  Positive for rash.  Psychiatric/Behavioral:  Negative for dysphoric mood and sleep disturbance. The patient is not nervous/anxious.     Patient Active Problem List   Diagnosis Date Noted   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   Hx of total hip arthroplasty, right 10/21/2021    Bilateral carotid artery stenosis 05/22/2021   Anemia 02/28/2021   Total knee replacement status 04/16/2020   Primary osteoarthritis of both knees 05/19/2019   Gastroesophageal reflux disease with esophagitis 02/02/2019   Personal history of other malignant neoplasm of skin 12/17/2018   Persistent atrial fibrillation 10/29/2018   Reducible right inguinal hernia 09/16/2018   DDD (degenerative disc disease), lumbar 12/23/2017   Onychomycosis of toenail 08/19/2017   Allergic rhinitis 08/08/2016   Carpal tunnel syndrome on left 12/12/2015   Chronic maxillary sinusitis 04/09/2015   Type II diabetes mellitus with complication 11/28/2014   Chronic low back pain 09/21/2014   Abnormal prostate specific antigen 04/17/2014   Enlarged prostate with lower urinary tract symptoms (LUTS) 04/17/2014   CKD stage 3 secondary to diabetes 04/17/2014   Hyperlipidemia associated with type 2 diabetes mellitus 04/17/2014   ED (erectile dysfunction) of organic origin 04/17/2014   Essential (primary) hypertension 04/17/2014   Back muscle spasm 04/17/2014   Degenerative arthritis of finger 04/17/2014   Breast development in males 04/17/2014   Leg varices 04/17/2014    Allergies  Allergen Reactions   Codeine Other (See Comments)    Made his "Wild"   Ceftriaxone Rash   Penicillin G Rash    Did it involve swelling of the face/tongue/throat, SOB, or low BP? No Did it involve sudden or severe rash/hives, skin peeling, or  any reaction on the inside of your mouth or nose? No Did you need to seek medical attention at a hospital or doctor's office? No When did it last happen?       If all above answers are "NO", may proceed with cephalosporin use.  IgE = 35 (WNL) on 01/18/2020     Past Surgical History:  Procedure Laterality Date   ANKLE ARTHROSCOPY WITH OPEN REDUCTION INTERNAL FIXATION (ORIF) Left 1998   COLONOSCOPY     COLONOSCOPY WITH PROPOFOL N/A 05/28/2021   Procedure: COLONOSCOPY WITH PROPOFOL;   Surgeon: Midge Minium, MD;  Location: [facility] ENDOSCOPY;  Service: Endoscopy;  Laterality: N/A;   ESOPHAGOGASTRODUODENOSCOPY (EGD) WITH PROPOFOL N/A 05/28/2021   Procedure: ESOPHAGOGASTRODUODENOSCOPY (EGD) WITH PROPOFOL;  Surgeon: Midge Minium, MD;  Location: [facility] ENDOSCOPY;  Service: Endoscopy;  Laterality: N/A;   ETHMOIDECTOMY Left 03/08/2020   Procedure: TOTAL ETHMOIDECTOMY;  Surgeon: Vernie Murders, MD;  Location: [facility] SURGERY CNTR;  Service: ENT;  Laterality: Left;   FRONTAL SINUS EXPLORATION Left 03/08/2020   Procedure: FRONTAL SINUS EXPLORATION;  Surgeon: Vernie Murders, MD;  Location: [facility] SURGERY CNTR;  Service: ENT;  Laterality: Left;   GIVENS CAPSULE STUDY N/A 07/11/2021   Procedure: GIVENS CAPSULE STUDY;  Surgeon: Midge Minium, MD;  Location: [facility] ENDOSCOPY;  Service: Endoscopy;  Laterality: N/A;   HEMORRHOID SURGERY     IMAGE GUIDED SINUS SURGERY N/A 03/08/2020   Procedure: IMAGE GUIDED SINUS SURGERY;  Surgeon: Vernie Murders, MD;  Location: [facility] SURGERY CNTR;  Service: ENT;  Laterality: N/A;  placed disk on OR charge nurse desk 2-21 kp PLACED 2ND DISK ON OR CHARGE NURSE DESK 02-28-20 KP 3RD DISK ON OR CHARGE NURSE DESK 2-25- KP   INGUINAL HERNIA REPAIR Right 11/12/2018   Procedure: HERNIA REPAIR INGUINAL ADULT;  Surgeon: Duanne Guess, MD;  Location: [facility] ORS;  Service: General;  Laterality: Right;   INGUINAL HERNIA REPAIR Left 2012   KNEE ARTHROPLASTY Right 04/16/2020   Procedure: COMPUTER ASSISTED TOTAL KNEE ARTHROPLASTY;  Surgeon: Donato Heinz, MD;  Location: [facility] ORS;  Service: Orthopedics;  Laterality: Right;   KNEE ARTHROSCOPY Right 2006   MAXILLARY ANTROSTOMY Left 03/08/2020   Procedure: MAXILLARY ANTROSTOMY WITH TISSUE REMOVAL;  Surgeon: Vernie Murders, MD;  Location: [facility] SURGERY CNTR;  Service: ENT;  Laterality: Left;   SEPTOPLASTY N/A 03/08/2020   Procedure: SEPTOPLASTY;  Surgeon: Vernie Murders, MD;  Location: [facility] SURGERY CNTR;  Service: ENT;  Laterality: N/A;    skin cancer resection Right 04/07/2018   ON hand.   THUMB ARTHROSCOPY Right 2013   tendon flap to joint   TONSILLECTOMY     TOTAL HIP ARTHROPLASTY Right 10/21/2021   Procedure: TOTAL HIP ARTHROPLASTY;  Surgeon: Donato Heinz, MD;  Location: [facility] ORS;  Service: Orthopedics;  Laterality: Right;   UMBILICAL HERNIA REPAIR Left 2012   UPPER GI ENDOSCOPY      Social History   Tobacco Use   Smoking status: Never   Smokeless tobacco: Never  Vaping Use   Vaping Use: Never used  Substance Use Topics   Alcohol use: No    Alcohol/week: 0.0 standard drinks of alcohol   Drug use: No     Medication list has been reviewed and updated.  Current Meds  Medication Sig   acetaminophen (TYLENOL) 500 MG tablet Take 1,000 mg by mouth 2 (two) times daily.   amLODipine (NORVASC) 2.5 MG tablet TAKE 1 TABLET DAILY   aspirin EC 81 MG tablet Take 81 mg by mouth daily. Swallow whole.  atenolol (TENORMIN) 50 MG tablet TAKE 1 TABLET AT BEDTIME   cholecalciferol (VITAMIN D) 1000 UNITS tablet Take 1,000 Units by mouth daily.   clindamycin (CLEOCIN) 300 MG capsule Take 300 mg by mouth 3 (three) times daily.   clopidogrel (PLAVIX) 75 MG tablet Take 75 mg by mouth daily.   docusate sodium (COLACE) 100 MG capsule Take 200 mg by mouth daily.    ferrous sulfate 325 (65 FE) MG EC tablet Take 1 tablet by mouth daily.   finasteride (PROSCAR) 5 MG tablet TAKE 1 TABLET DAILY   fluticasone (FLONASE) 50 MCG/ACT nasal spray Place 2 sprays into both nostrils daily. (Patient taking differently: Place 2 sprays into both nostrils as needed for allergies.)   gabapentin (NEURONTIN) 100 MG capsule Take 1 capsule (100 mg total) by mouth at bedtime.   glucose blood (ONETOUCH ULTRA) test strip Use as instructed   losartan (COZAAR) 100 MG tablet Take 0.5 tablets (50 mg total) by mouth 2 (two) times daily.   Melatonin 10 MG CAPS Take 10 mg by mouth at bedtime.   metFORMIN (GLUCOPHAGE-XR) 500 MG 24 hr tablet TAKE 1 TABLET TWICE  A DAY  WITH MEALS   Multiple Vitamin (MULTIVITAMIN WITH MINERALS) TABS tablet Take 1 tablet by mouth daily.   OneTouch Delica Lancets 33G MISC 1 each by Does not apply route daily. Use to test blood sugar once daily.   pantoprazole (PROTONIX) 40 MG tablet Take 1 tablet (40 mg total) by mouth 2 (two) times daily.   Probiotic Product (PROBIOTIC DAILY PO) Take 1 tablet by mouth daily.   rosuvastatin (CRESTOR) 20 MG tablet TAKE 1 TABLET AT BEDTIME   scopolamine (TRANSDERM SCOP, 1.5 MG,) 1 MG/3DAYS Place 1 patch onto the skin See admin instructions. PRN for deep see fishing   sitaGLIPtin (JANUVIA) 100 MG tablet TAKE 1 TABLET DAILY   tamsulosin (FLOMAX) 0.4 MG CAPS capsule TAKE 1 CAPSULE DAILY AFTER SUPPER   valACYclovir (VALTREX) 1000 MG tablet Take 1 tablet (1,000 mg total) by mouth 3 (three) times daily for 10 days.       04/29/2022    8:59 AM 04/09/2022    9:00 AM 06/20/2021    8:02 AM 02/28/2021   10:43 AM  GAD 7 : Generalized Anxiety Score  Nervous, Anxious, on Edge 0 0 0 0  Control/stop worrying 0 0 0 0  Worry too much - different things 0 0 0 0  Trouble relaxing 0 0 0 0  Restless 0 0 0 0  Easily annoyed or irritable 0 0 0 0  Afraid - awful might happen 0 0 0 0  Total GAD 7 Score 0 0 0 0  Anxiety Difficulty Not difficult at all Not difficult at all Not difficult at all        04/29/2022    8:59 AM 04/09/2022    9:00 AM 01/10/2022   10:05 AM  Depression screen PHQ 2/9  Decreased Interest 0 0 0  Down, Depressed, Hopeless 0 0 0  PHQ - 2 Score 0 0 0  Altered sleeping 2 0   Tired, decreased energy 0 0   Change in appetite 0 0   Feeling bad or failure about yourself  0 0   Trouble concentrating 0 0   Moving slowly or fidgety/restless 0 0   Suicidal thoughts 0 0   PHQ-9 Score 2 0   Difficult doing work/chores Not difficult at all Not difficult at all     BP Readings from Last 3  Encounters:  04/29/22 134/80  04/09/22 128/62  01/24/22 (!) 176/90    Physical Exam Constitutional:       Appearance: Normal appearance.  Cardiovascular:     Rate and Rhythm: Normal rate and regular rhythm.  Pulmonary:     Effort: Pulmonary effort is normal.     Breath sounds: No wheezing or rhonchi.  Musculoskeletal:     Cervical back: Normal range of motion.  Skin:    Findings: Erythema and rash present.          Comments: Scattered erythematous lesions in the distribution noted  Neurological:     Mental Status: He is alert.     Wt Readings from Last 3 Encounters:  04/29/22 175 lb 6.4 oz (79.6 kg)  04/09/22 172 lb 9.6 oz (78.3 kg)  01/24/22 167 lb (75.8 kg)    BP 134/80   Pulse 76   Ht 6\' 2"  (1.88 m)   Wt 175 lb 6.4 oz (79.6 kg)   SpO2 96%   BMI 22.52 kg/m   Assessment and Plan:  Problem List Items Addressed This Visit   None Visit Diagnoses     Herpes zoster without complication    -  Primary   can continue Mobic Valtrex x 10 days and gabapentin 100 mg at bedtime call Ortho to see if CTR needs to be rescheduled.   Relevant Medications   clindamycin (CLEOCIN) 300 MG capsule   valACYclovir (VALTREX) 1000 MG tablet   gabapentin (NEURONTIN) 100 MG capsule       No follow-ups on file.   Partially dictated using Dragon software, any errors are not intentional.  Reubin Milan, MD [facility] [place], [place]

## 2022-04-29 NOTE — Telephone Encounter (Signed)
  Chief Complaint: Widespread painful rash Symptoms: Red lumps painful Frequency: Saturday Pertinent Negatives: Patient denies Fever Disposition: ED /[] Urgent Care (no appt availability in office) / Appointment(In office/virtual)/  [place] [facility]/ Home Care/ Refused Recommended Disposition /[] [place] [facility]/  Follow-up with PCP Additional Notes: PT states that this started Saturday and has spread. Pt has "knots" under skin, some as big as the end of a finger and painful.    Reason for Disposition  Mild widespread rash  (Exception: Heat rash lasting 3 days or less.)  Answer Assessment - Initial Assessment Questions 1. APPEARANCE of RASH: "Describe the rash." (e.g., spots, blisters, raised areas, skin peeling, scaly)     Red raised bumps 2. SIZE: "How big are the spots?" (e.g., tip of pen, eraser, coin; inches, centimeters)     As large as the dime and some dime 3. LOCATION: "Where is the rash located?"     Neck chest 4. COLOR: "What color is the rash?" (Note: It is difficult to assess rash color in people with darker-colored skin. When this situation occurs, simply ask the caller to describe what they see.)     red 5. ONSET: "When did the rash begin?"     Saturday 6. FEVER: "Do you have a fever?" If Yes, ask: "What is your temperature, how was it measured, and when did it start?"     no 7. ITCHING: "Does the rash itch?" If Yes, ask: "How bad is the itch?" (Scale 1-10; or mild, moderate, severe)     none 8. CAUSE: "What do you think is causing the rash?"     No idea 9. MEDICINE FACTORS: "Have you started any new medicines within the last 2 weeks?" (e.g., antibiotics)      no 10. OTHER SYMPTOMS: "Do you have any other symptoms?" (e.g., dizziness, headache, sore throat, joint pain)       no  Protocols used: Rash or Redness - [facility]

## 2022-05-01 ENCOUNTER — Ambulatory Visit: Payer: Medicare HMO | Admitting: Radiation Oncology

## 2022-05-05 DIAGNOSIS — G5601 Carpal tunnel syndrome, right upper limb: Secondary | ICD-10-CM | POA: Diagnosis not present

## 2022-05-09 ENCOUNTER — Other Ambulatory Visit: Payer: Self-pay | Admitting: Internal Medicine

## 2022-05-09 ENCOUNTER — Ambulatory Visit: Payer: Self-pay | Admitting: *Deleted

## 2022-05-09 DIAGNOSIS — B029 Zoster without complications: Secondary | ICD-10-CM

## 2022-05-09 MED ORDER — GABAPENTIN 100 MG PO CAPS: ORAL_CAPSULE | ORAL | 0 refills | Status: DC

## 2022-05-09 NOTE — Telephone Encounter (Signed)
Patient informed. Patient did not give alternative med he can take besides codeine. Patient will try gabapentin as prescribed.  - Maxwell Stanley

## 2022-05-09 NOTE — Telephone Encounter (Signed)
  Chief Complaint: Diagnosed with Shingles by Dr. Judithann Graves on 04/29/2022.    Having severe pain.   Can she prescribe something for the pain? Symptoms: Can't sleep due to the pain.   This is the worst pain I think I've ever had in my 87 years. Frequency: Due to Shingles Pertinent Negatives: Patient denies N/A Disposition: [] ED /[] Urgent Care (no appt availability in office) / [] Appointment(In office/virtual)/ []  [place] [facility]/ [] Home Care/ [] Refused Recommended Disposition /[] [place] [facility]/ [x]  Follow-up with PCP Additional Notes: High priority message sent to Dr. Judithann Graves with pt's request for pain medication for the Shingles.   Has one more pill left of the antiviral she prescribed for him on the 23rd.    Please call into the [facility] in [place].

## 2022-05-09 NOTE — Telephone Encounter (Signed)
Please review.  KP

## 2022-05-09 NOTE — Telephone Encounter (Signed)
Reason for Disposition  SEVERE pain (e.g., excruciating)  Answer Assessment - Initial Assessment Questions 1. APPEARANCE of RASH: "Describe the rash."      I have Shingles and I'm in a lot of pain.  I'm out of the medicine.   I have one pill left.     She saw me on the 23rd.    It's blisters.   My shoulder neck and back.   It's very painful.       2. LOCATION: "Where is the rash located?"      My shoulder, neck and back 3. ONSET: "When did the rash start?"      I saw Dr. Judithann Graves on 04/29/2022 and she prescribed me the medicine for Shingles.   I have one pill left. 4. ITCHING: "Does the rash itch?" If Yes, ask: "How bad is the itch?"  (Scale 1-10; or mild, moderate, severe)     Not asked 5. PAIN: "Does the rash hurt?" If Yes, ask: "How bad is the pain?"  (Scale 0-10; or none, mild, moderate, severe)    - NONE (0): no pain    - MILD (1-3): doesn't interfere with normal activities     - MODERATE (4-7): interferes with normal activities or awakens from sleep     - SEVERE (8-10): excruciating pain, unable to do any normal activities     Severe pain 6. OTHER SYMPTOMS: "Do you have any other symptoms?" (e.g., fever)     No 7. PREGNANCY: "Is there any chance you are pregnant?" "When was your last menstrual period?"     N/A  Protocols used: Shingles (Zoster)-A-AH

## 2022-05-15 ENCOUNTER — Encounter: Payer: Self-pay | Admitting: Radiation Oncology

## 2022-05-15 ENCOUNTER — Ambulatory Visit
Admission: RE | Admit: 2022-05-15 | Discharge: 2022-05-15 | Disposition: A | Discharge status: Home / Self Care | Payer: Medicare HMO | Source: Ambulatory Visit | Attending: Radiation Oncology | Admitting: Radiation Oncology

## 2022-05-15 VITALS — BP 141/86 | HR 67 | Temp 96.6°F | Resp 20 | Wt 172.9 lb

## 2022-05-15 DIAGNOSIS — Z923 Personal history of irradiation: Secondary | ICD-10-CM | POA: Diagnosis not present

## 2022-05-15 DIAGNOSIS — C44202 Unspecified malignant neoplasm of skin of right ear and external auricular canal: Secondary | ICD-10-CM | POA: Diagnosis not present

## 2022-05-15 NOTE — Progress Notes (Signed)
Radiation Oncology Follow up Note  Name: Maxwell Stanley   Date:   05/15/2022 MRN:  [id number] DOB: [birth year]    This 88 y.o. 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  REFERRING PROVIDER: Reubin Milan, MD  HPI: Patient is a 88 year old male now out 1 month having electron-beam therapy to his right ear for squamous cell carcinoma status post Mohs surgery with residual deep margin involvement.  Seen today in follow-up he is doing well.  He is asymptomatic cosmetic result is excellent no evidence of viable tumor is noted..  Patient is getting over a bout of shingles in his upper back.  COMPLICATIONS OF TREATMENT: none  FOLLOW UP COMPLIANCE: keeps appointments   PHYSICAL EXAM:  BP (!) 141/86   Pulse 67   Temp (!) 96.6 F (35.9 C)   Resp 20   Wt 172 lb 14.4 oz (78.4 kg)   SpO2 100%   BMI 22.20 kg/m  Examination of the ear shows no evidence of viable tumor.  Area is well-healed.  No evidence of submental or cervical nodes are identified.  Well-developed well-nourished patient in NAD. HEENT reveals PERLA, EOMI, discs not visualized.  Oral cavity is clear. No oral mucosal lesions are identified. Neck is clear without evidence of cervical or supraclavicular adenopathy. Lungs are clear to A&P. Cardiac examination is essentially unremarkable with regular rate and rhythm without murmur rub or thrill. Abdomen is benign with no organomegaly or masses noted. Motor sensory and DTR levels are equal and symmetric in the upper and lower extremities. Cranial nerves II through XII are grossly intact. Proprioception is intact. No peripheral adenopathy or edema is identified. No motor or sensory levels are noted. Crude visual fields are within normal range.  RADIOLOGY RESULTS: No current films for review  PLAN: Present time patient is doing  well excellent cosmetic result and no evidence of viable tumor.  I have asked to see the patient back 1 more time in 5 months and then will turn follow-up care over to dermatology.  Patient comprehends my recommendations well.  I would like to take this opportunity to thank you for allowing me to participate in the care of your patient.Carmina Miller, MD

## 2022-05-16 ENCOUNTER — Encounter: Payer: Self-pay | Admitting: Internal Medicine

## 2022-05-16 ENCOUNTER — Ambulatory Visit (INDEPENDENT_AMBULATORY_CARE_PROVIDER_SITE_OTHER): Payer: Medicare HMO | Admitting: Internal Medicine

## 2022-05-16 VITALS — BP 128/60 | HR 76 | Ht 74.0 in | Wt 172.0 lb

## 2022-05-16 DIAGNOSIS — B0229 Other postherpetic nervous system involvement: Secondary | ICD-10-CM

## 2022-05-16 NOTE — Progress Notes (Signed)
Date:  05/16/2022   Name:  OZIAH BOBST   DOB:  [birth year]   MRN:  [id number]   Chief Complaint: Herpes Zoster (Shingles is on Neck, left shoulder, and left chest.)  Neck Pain  This is a new problem. The current episode started 1 to 4 weeks ago. The problem occurs constantly. The problem has been unchanged. Associated with: due to Zoster. Associated symptoms include chest pain. Pertinent negatives include no numbness.    Lab Results  Component Value Date   NA 140 04/09/2022   K 4.3 04/09/2022   CO2 23 04/09/2022   GLUCOSE 145 (H) 04/09/2022   BUN 19 04/09/2022   CREATININE 1.32 (H) 04/09/2022   CALCIUM 9.7 04/09/2022   EGFR 53 (L) 04/09/2022   GFRNONAA >60 04/04/2020   Lab Results  Component Value Date   CHOL 138 04/09/2022   HDL 40 04/09/2022   LDLCALC 76 04/09/2022   TRIG 119 04/09/2022   CHOLHDL 3.5 04/09/2022   Lab Results  Component Value Date   TSH 2.500 02/19/2021   Lab Results  Component Value Date   HGBA1C 6.9 (H) 04/09/2022   Lab Results  Component Value Date   WBC 6.6 04/09/2022   HGB 13.8 04/09/2022   HCT 39.4 04/09/2022   MCV 94 04/09/2022   PLT 214 04/09/2022   Lab Results  Component Value Date   ALT 19 04/09/2022   AST 27 04/09/2022   ALKPHOS 75 04/09/2022   BILITOT 0.6 04/09/2022   No results found for: "25OHVITD2", "25OHVITD3", "VD25OH"   Review of Systems  Cardiovascular:  Positive for chest pain. Negative for palpitations.  Musculoskeletal:  Positive for neck pain.  Skin:  Positive for rash.  Neurological:  Negative for numbness.    Patient Active Problem List   Diagnosis Date Noted   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   Hx of total hip arthroplasty, right 10/21/2021   Bilateral carotid artery stenosis 05/22/2021   Anemia 02/28/2021   Total knee replacement status 04/16/2020   Primary osteoarthritis of both knees 05/19/2019   Gastroesophageal reflux disease with esophagitis 02/02/2019    Personal history of other malignant neoplasm of skin 12/17/2018   Persistent atrial fibrillation (HCC) 10/29/2018   Reducible right inguinal hernia 09/16/2018   DDD (degenerative disc disease), lumbar 12/23/2017   Onychomycosis of toenail 08/19/2017   Allergic rhinitis 08/08/2016   Carpal tunnel syndrome on left 12/12/2015   Chronic maxillary sinusitis 04/09/2015   Type II diabetes mellitus with complication (HCC) 11/28/2014   Chronic low back pain 09/21/2014   Abnormal prostate specific antigen 04/17/2014   Enlarged prostate with lower urinary tract symptoms (LUTS) 04/17/2014   CKD stage 3 secondary to diabetes (HCC) 04/17/2014   Hyperlipidemia associated with type 2 diabetes mellitus (HCC) 04/17/2014   ED (erectile dysfunction) of organic origin 04/17/2014   Essential (primary) hypertension 04/17/2014   Back muscle spasm 04/17/2014   Degenerative arthritis of finger 04/17/2014   Breast development in males 04/17/2014   Leg varices 04/17/2014    Allergies  Allergen Reactions   Codeine Other (See Comments)    Made his "Wild"   Ceftriaxone Rash   Penicillin G Rash    Did it involve swelling of the face/tongue/throat, SOB, or low BP? No Did it involve sudden or severe rash/hives, skin peeling, or any reaction on the inside of your mouth or nose? No Did you need to seek medical attention at a hospital or doctor's office? No When did it  last happen?       If all above answers are "NO", may proceed with cephalosporin use.  IgE = 35 (WNL) on 01/18/2020     Past Surgical History:  Procedure Laterality Date   ANKLE ARTHROSCOPY WITH OPEN REDUCTION INTERNAL FIXATION (ORIF) Left 1998   COLONOSCOPY     COLONOSCOPY WITH PROPOFOL N/A 05/28/2021   Procedure: COLONOSCOPY WITH PROPOFOL;  Surgeon: Midge Minium, MD;  Location: [facility] ENDOSCOPY;  Service: Endoscopy;  Laterality: N/A;   ESOPHAGOGASTRODUODENOSCOPY (EGD) WITH PROPOFOL N/A 05/28/2021   Procedure: ESOPHAGOGASTRODUODENOSCOPY (EGD)  WITH PROPOFOL;  Surgeon: Midge Minium, MD;  Location: [facility] ENDOSCOPY;  Service: Endoscopy;  Laterality: N/A;   ETHMOIDECTOMY Left 03/08/2020   Procedure: TOTAL ETHMOIDECTOMY;  Surgeon: Vernie Murders, MD;  Location: [facility] SURGERY CNTR;  Service: ENT;  Laterality: Left;   FRONTAL SINUS EXPLORATION Left 03/08/2020   Procedure: FRONTAL SINUS EXPLORATION;  Surgeon: Vernie Murders, MD;  Location: [facility] SURGERY CNTR;  Service: ENT;  Laterality: Left;   GIVENS CAPSULE STUDY N/A 07/11/2021   Procedure: GIVENS CAPSULE STUDY;  Surgeon: Midge Minium, MD;  Location: [facility] ENDOSCOPY;  Service: Endoscopy;  Laterality: N/A;   HEMORRHOID SURGERY     IMAGE GUIDED SINUS SURGERY N/A 03/08/2020   Procedure: IMAGE GUIDED SINUS SURGERY;  Surgeon: Vernie Murders, MD;  Location: [facility] SURGERY CNTR;  Service: ENT;  Laterality: N/A;  placed disk on OR charge nurse desk 2-21 kp PLACED 2ND DISK ON OR CHARGE NURSE DESK 02-28-20 KP 3RD DISK ON OR CHARGE NURSE DESK 2-25- KP   INGUINAL HERNIA REPAIR Right 11/12/2018   Procedure: HERNIA REPAIR INGUINAL ADULT;  Surgeon: Duanne Guess, MD;  Location: [facility] ORS;  Service: General;  Laterality: Right;   INGUINAL HERNIA REPAIR Left 2012   KNEE ARTHROPLASTY Right 04/16/2020   Procedure: COMPUTER ASSISTED TOTAL KNEE ARTHROPLASTY;  Surgeon: Donato Heinz, MD;  Location: [facility] ORS;  Service: Orthopedics;  Laterality: Right;   KNEE ARTHROSCOPY Right 2006   MAXILLARY ANTROSTOMY Left 03/08/2020   Procedure: MAXILLARY ANTROSTOMY WITH TISSUE REMOVAL;  Surgeon: Vernie Murders, MD;  Location: [facility] SURGERY CNTR;  Service: ENT;  Laterality: Left;   SEPTOPLASTY N/A 03/08/2020   Procedure: SEPTOPLASTY;  Surgeon: Vernie Murders, MD;  Location: [facility] SURGERY CNTR;  Service: ENT;  Laterality: N/A;   skin cancer resection Right 04/07/2018   ON hand.   THUMB ARTHROSCOPY Right 2013   tendon flap to joint   TONSILLECTOMY     TOTAL HIP ARTHROPLASTY Right 10/21/2021   Procedure: TOTAL HIP  ARTHROPLASTY;  Surgeon: Donato Heinz, MD;  Location: [facility] ORS;  Service: Orthopedics;  Laterality: Right;   UMBILICAL HERNIA REPAIR Left 2012   UPPER GI ENDOSCOPY      Social History   Tobacco Use   Smoking status: Never   Smokeless tobacco: Never  Vaping Use   Vaping Use: Never used  Substance Use Topics   Alcohol use: No    Alcohol/week: 0.0 standard drinks of alcohol   Drug use: No     Medication list has been reviewed and updated.  Current Meds  Medication Sig   acetaminophen (TYLENOL) 500 MG tablet Take 1,000 mg by mouth 2 (two) times daily.   amLODipine (NORVASC) 2.5 MG tablet TAKE 1 TABLET DAILY   aspirin EC 81 MG tablet Take 81 mg by mouth daily. Swallow whole.   atenolol (TENORMIN) 50 MG tablet TAKE 1 TABLET AT BEDTIME   cholecalciferol (VITAMIN D) 1000 UNITS tablet Take 1,000 Units by mouth daily.   clindamycin (  CLEOCIN) 300 MG capsule Take 300 mg by mouth 3 (three) times daily.   clopidogrel (PLAVIX) 75 MG tablet Take 75 mg by mouth daily.   docusate sodium (COLACE) 100 MG capsule Take 200 mg by mouth daily.    ferrous sulfate 325 (65 FE) MG EC tablet Take 1 tablet by mouth daily.   finasteride (PROSCAR) 5 MG tablet TAKE 1 TABLET DAILY   fluticasone (FLONASE) 50 MCG/ACT nasal spray Place 2 sprays into both nostrils daily. (Patient taking differently: Place 2 sprays into both nostrils as needed for allergies.)   gabapentin (NEURONTIN) 100 MG capsule Take 2 caps in the AM,  2 caps at midday and 4 caps at bedtime (Patient taking differently: 100 mg 4 (four) times daily. Take 2 caps in the AM,  2 caps at midday and 4 caps at bedtime)   glucose blood (ONETOUCH ULTRA) test strip Use as instructed   lidocaine (LMX) 4 % cream Apply 1 Application topically as needed.   losartan (COZAAR) 100 MG tablet Take 0.5 tablets (50 mg total) by mouth 2 (two) times daily.   Melatonin 10 MG CAPS Take 10 mg by mouth at bedtime.   metFORMIN (GLUCOPHAGE-XR) 500 MG 24 hr tablet TAKE 1  TABLET TWICE A DAY  WITH MEALS   Multiple Vitamin (MULTIVITAMIN WITH MINERALS) TABS tablet Take 1 tablet by mouth daily.   OneTouch Delica Lancets 33G MISC 1 each by Does not apply route daily. Use to test blood sugar once daily.   pantoprazole (PROTONIX) 40 MG tablet Take 1 tablet (40 mg total) by mouth 2 (two) times daily.   Probiotic Product (PROBIOTIC DAILY PO) Take 1 tablet by mouth daily.   rosuvastatin (CRESTOR) 20 MG tablet TAKE 1 TABLET AT BEDTIME   scopolamine (TRANSDERM SCOP, 1.5 MG,) 1 MG/3DAYS Place 1 patch onto the skin See admin instructions. PRN for deep see fishing   sitaGLIPtin (JANUVIA) 100 MG tablet TAKE 1 TABLET DAILY   tamsulosin (FLOMAX) 0.4 MG CAPS capsule TAKE 1 CAPSULE DAILY AFTER SUPPER   [DISCONTINUED] HYDROcodone-acetaminophen (NORCO/VICODIN) 5-325 MG tablet Take 1 tablet by mouth every 6 (six) hours as needed.   [DISCONTINUED] valACYclovir (VALTREX) 1000 MG tablet Take 1,000 mg by mouth 2 (two) times daily.       04/29/2022    8:59 AM 04/09/2022    9:00 AM 06/20/2021    8:02 AM 02/28/2021   10:43 AM  GAD 7 : Generalized Anxiety Score  Nervous, Anxious, on Edge 0 0 0 0  Control/stop worrying 0 0 0 0  Worry too much - different things 0 0 0 0  Trouble relaxing 0 0 0 0  Restless 0 0 0 0  Easily annoyed or irritable 0 0 0 0  Afraid - awful might happen 0 0 0 0  Total GAD 7 Score 0 0 0 0  Anxiety Difficulty Not difficult at all Not difficult at all Not difficult at all        04/29/2022    8:59 AM 04/09/2022    9:00 AM 01/10/2022   10:05 AM  Depression screen PHQ 2/9  Decreased Interest 0 0 0  Down, Depressed, Hopeless 0 0 0  PHQ - 2 Score 0 0 0  Altered sleeping 2 0   Tired, decreased energy 0 0   Change in appetite 0 0   Feeling bad or failure about yourself  0 0   Trouble concentrating 0 0   Moving slowly or fidgety/restless 0 0   Suicidal  thoughts 0 0   PHQ-9 Score 2 0   Difficult doing work/chores Not difficult at all Not difficult at all     BP  Readings from Last 3 Encounters:  05/16/22 128/60  05/15/22 (!) 141/86  04/29/22 134/80    Physical Exam Vitals and nursing note reviewed.  Constitutional:      Appearance: Normal appearance. He is well-developed.  HENT:     Head: Normocephalic.     Right Ear: Tympanic membrane, ear canal and external ear normal.     Left Ear: Tympanic membrane, ear canal and external ear normal.     Nose: Nose normal.  Eyes:     Conjunctiva/sclera: Conjunctivae normal.     Pupils: Pupils are equal, round, and reactive to light.  Neck:     Thyroid: No thyromegaly.     Vascular: No carotid bruit.  Cardiovascular:     Rate and Rhythm: Normal rate and regular rhythm.     Heart sounds: Normal heart sounds.  Pulmonary:     Effort: Pulmonary effort is normal.     Breath sounds: Normal breath sounds. No wheezing.  Chest:  Breasts:    Right: No mass.     Left: No mass.  Abdominal:     General: Bowel sounds are normal.     Palpations: Abdomen is soft.     Tenderness: There is no abdominal tenderness.  Musculoskeletal:     Cervical back: Normal range of motion and neck supple.     Right lower leg: No edema.     Left lower leg: No edema.  Lymphadenopathy:     Cervical: No cervical adenopathy.  Skin:    General: Skin is warm and dry.     Findings: Erythema and rash present. Rash is crusting.          Comments: Multiple red healing vesicles in the distribution noted No s/s bacterial infection  Neurological:     Mental Status: He is alert and oriented to person, place, and time.     Deep Tendon Reflexes: Reflexes are normal and symmetric.  Psychiatric:        Attention and Perception: Attention normal.        Mood and Affect: Mood normal.     Wt Readings from Last 3 Encounters:  05/16/22 172 lb (78 kg)  05/15/22 172 lb 14.4 oz (78.4 kg)  04/29/22 175 lb 6.4 oz (79.6 kg)    BP 128/60   Pulse 76   Ht 6\' 2"  (1.88 m)   Wt 172 lb (78 kg)   SpO2 96%   BMI 22.08 kg/m   Assessment and  Plan:  Problem List Items Addressed This Visit   None Visit Diagnoses     PHN (postherpetic neuralgia)    -  Primary   Lesion are healing but he still has significant pain continue gabapentin 100 mg 4 per day add Mobic in the AM and Hydrocodone at HS       No follow-ups on file.   Partially dictated using Dragon software, any errors are not intentional.  Reubin Milan, MD [facility] Medicine [place], [place]

## 2022-05-16 NOTE — Patient Instructions (Signed)
Take a Meloxicam in the morning.  Continue to take gabapentin 4 per day.  Take Hydrocodone at bedtime.

## 2022-05-20 ENCOUNTER — Encounter: Payer: Self-pay | Admitting: Internal Medicine

## 2022-05-20 ENCOUNTER — Ambulatory Visit (INDEPENDENT_AMBULATORY_CARE_PROVIDER_SITE_OTHER): Payer: Medicare HMO | Admitting: Internal Medicine

## 2022-05-20 ENCOUNTER — Ambulatory Visit: Payer: Self-pay

## 2022-05-20 VITALS — BP 142/74 | HR 88 | Ht 74.0 in | Wt 178.0 lb

## 2022-05-20 DIAGNOSIS — B0229 Other postherpetic nervous system involvement: Secondary | ICD-10-CM

## 2022-05-20 DIAGNOSIS — L309 Dermatitis, unspecified: Secondary | ICD-10-CM | POA: Diagnosis not present

## 2022-05-20 DIAGNOSIS — L989 Disorder of the skin and subcutaneous tissue, unspecified: Secondary | ICD-10-CM | POA: Diagnosis not present

## 2022-05-20 MED ORDER — DULOXETINE HCL 60 MG PO CPEP
60.0000 mg | ORAL_CAPSULE | Freq: Every day | ORAL | 2 refills | Status: DC
Start: 2022-05-20 — End: 2022-06-12

## 2022-05-20 NOTE — Patient Instructions (Signed)
Lidoderm 4% patches - use up to 3 patches at one time - on in the morning and off at bedtime.  Continue Meloxicam and gabapentin.  Add Duloxetine 60 mg in the AM.

## 2022-05-20 NOTE — Progress Notes (Signed)
Date:  05/20/2022   Name:  Maxwell Stanley   DOB:  12-Apr-1934   MRN:  [id number]   Chief Complaint: Herpes Zoster  Neck Pain  This is a new problem. Episode onset: three weeks ago with Zoster. The problem has been unchanged. The quality of the pain is described as shooting and stabbing. The pain is moderate. Pertinent negatives include no chest pain or fever.    Lab Results  Component Value Date   NA 140 04/09/2022   K 4.3 04/09/2022   CO2 23 04/09/2022   GLUCOSE 145 (H) 04/09/2022   BUN 19 04/09/2022   CREATININE 1.32 (H) 04/09/2022   CALCIUM 9.7 04/09/2022   EGFR 53 (L) 04/09/2022   GFRNONAA >60 04/04/2020   Lab Results  Component Value Date   CHOL 138 04/09/2022   HDL 40 04/09/2022   LDLCALC 76 04/09/2022   TRIG 119 04/09/2022   CHOLHDL 3.5 04/09/2022   Lab Results  Component Value Date   TSH 2.500 02/19/2021   Lab Results  Component Value Date   HGBA1C 6.9 (H) 04/09/2022   Lab Results  Component Value Date   WBC 6.6 04/09/2022   HGB 13.8 04/09/2022   HCT 39.4 04/09/2022   MCV 94 04/09/2022   PLT 214 04/09/2022   Lab Results  Component Value Date   ALT 19 04/09/2022   AST 27 04/09/2022   ALKPHOS 75 04/09/2022   BILITOT 0.6 04/09/2022   No results found for: "25OHVITD2", "25OHVITD3", "VD25OH"   Review of Systems  Constitutional:  Negative for chills, fatigue and fever.  Respiratory:  Negative for chest tightness and shortness of breath.   Cardiovascular:  Negative for chest pain.  Musculoskeletal:  Positive for neck pain.  Skin:  Positive for rash.  Psychiatric/Behavioral:  Negative for sleep disturbance (sleeping better with 2 gabapentin and hydrocodone).     Patient Active Problem List   Diagnosis Date Noted   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   Hx of total hip arthroplasty, right 10/21/2021   Bilateral carotid artery stenosis 05/22/2021   Anemia 02/28/2021   Total knee replacement status 04/16/2020    Primary osteoarthritis of both knees 05/19/2019   Gastroesophageal reflux disease with esophagitis 02/02/2019   Personal history of other malignant neoplasm of skin 12/17/2018   Persistent atrial fibrillation (HCC) 10/29/2018   Reducible right inguinal hernia 09/16/2018   DDD (degenerative disc disease), lumbar 12/23/2017   Onychomycosis of toenail 08/19/2017   Allergic rhinitis 08/08/2016   Carpal tunnel syndrome on left 12/12/2015   Chronic maxillary sinusitis 04/09/2015   Type II diabetes mellitus with complication (HCC) 11/28/2014   Chronic low back pain 09/21/2014   Abnormal prostate specific antigen 04/17/2014   Enlarged prostate with lower urinary tract symptoms (LUTS) 04/17/2014   CKD stage 3 secondary to diabetes (HCC) 04/17/2014   Hyperlipidemia associated with type 2 diabetes mellitus (HCC) 04/17/2014   ED (erectile dysfunction) of organic origin 04/17/2014   Essential (primary) hypertension 04/17/2014   Back muscle spasm 04/17/2014   Degenerative arthritis of finger 04/17/2014   Breast development in males 04/17/2014   Leg varices 04/17/2014    Allergies  Allergen Reactions   Codeine Other (See Comments)    Made his "Wild"   Ceftriaxone Rash   Penicillin G Rash    Did it involve swelling of the face/tongue/throat, SOB, or low BP? No Did it involve sudden or severe rash/hives, skin peeling, or any reaction on the inside of your mouth  or nose? No Did you need to seek medical attention at a hospital or doctor's office? No When did it last happen?       If all above answers are "NO", may proceed with cephalosporin use.  IgE = 35 (WNL) on 01/18/2020     Past Surgical History:  Procedure Laterality Date   ANKLE ARTHROSCOPY WITH OPEN REDUCTION INTERNAL FIXATION (ORIF) Left 1998   COLONOSCOPY     COLONOSCOPY WITH PROPOFOL N/A 05/28/2021   Procedure: COLONOSCOPY WITH PROPOFOL;  Surgeon: Midge Minium, MD;  Location: [facility] ENDOSCOPY;  Service: Endoscopy;  Laterality: N/A;    ESOPHAGOGASTRODUODENOSCOPY (EGD) WITH PROPOFOL N/A 05/28/2021   Procedure: ESOPHAGOGASTRODUODENOSCOPY (EGD) WITH PROPOFOL;  Surgeon: Midge Minium, MD;  Location: [facility] ENDOSCOPY;  Service: Endoscopy;  Laterality: N/A;   ETHMOIDECTOMY Left 03/08/2020   Procedure: TOTAL ETHMOIDECTOMY;  Surgeon: Vernie Murders, MD;  Location: [facility] SURGERY CNTR;  Service: ENT;  Laterality: Left;   FRONTAL SINUS EXPLORATION Left 03/08/2020   Procedure: FRONTAL SINUS EXPLORATION;  Surgeon: Vernie Murders, MD;  Location: [facility] SURGERY [facility];  Service: ENT;  Laterality: Left;   GIVENS CAPSULE STUDY N/A 07/11/2021   Procedure: GIVENS CAPSULE STUDY;  Surgeon: Midge Minium, MD;  Location: [facility] ENDOSCOPY;  Service: Endoscopy;  Laterality: N/A;   HEMORRHOID SURGERY     IMAGE GUIDED SINUS SURGERY N/A 03/08/2020   Procedure: IMAGE GUIDED SINUS SURGERY;  Surgeon: Vernie Murders, MD;  Location: [facility] SURGERY CNTR;  Service: ENT;  Laterality: N/A;  placed disk on OR charge nurse desk 2-21 kp PLACED 2ND DISK ON OR CHARGE NURSE DESK 02-28-20 KP 3RD DISK ON OR CHARGE NURSE DESK 2-25- KP   INGUINAL HERNIA REPAIR Right 11/12/2018   Procedure: HERNIA REPAIR INGUINAL ADULT;  Surgeon: Duanne Guess, MD;  Location: [facility] ORS;  Service: General;  Laterality: Right;   INGUINAL HERNIA REPAIR Left 2012   KNEE ARTHROPLASTY Right 04/16/2020   Procedure: COMPUTER ASSISTED TOTAL KNEE ARTHROPLASTY;  Surgeon: Donato Heinz, MD;  Location: [facility] ORS;  Service: Orthopedics;  Laterality: Right;   KNEE ARTHROSCOPY Right 2006   MAXILLARY ANTROSTOMY Left 03/08/2020   Procedure: MAXILLARY ANTROSTOMY WITH TISSUE REMOVAL;  Surgeon: Vernie Murders, MD;  Location: [facility] SURGERY CNTR;  Service: ENT;  Laterality: Left;   SEPTOPLASTY N/A 03/08/2020   Procedure: SEPTOPLASTY;  Surgeon: Vernie Murders, MD;  Location: [facility] SURGERY CNTR;  Service: ENT;  Laterality: N/A;   skin cancer resection Right 04/07/2018   ON hand.   THUMB ARTHROSCOPY Right 2013    tendon flap to joint   TONSILLECTOMY     TOTAL HIP ARTHROPLASTY Right 10/21/2021   Procedure: TOTAL HIP ARTHROPLASTY;  Surgeon: Donato Heinz, MD;  Location: [facility] ORS;  Service: Orthopedics;  Laterality: Right;   UMBILICAL HERNIA REPAIR Left 2012   UPPER GI ENDOSCOPY      Social History   Tobacco Use   Smoking status: Never   Smokeless tobacco: Never  Vaping Use   Vaping Use: Never used  Substance Use Topics   Alcohol use: No    Alcohol/week: 0.0 standard drinks of alcohol   Drug use: No     Medication list has been reviewed and updated.  Current Meds  Medication Sig   acetaminophen (TYLENOL) 500 MG tablet Take 1,000 mg by mouth 2 (two) times daily.   amLODipine (NORVASC) 2.5 MG tablet TAKE 1 TABLET DAILY   aspirin EC 81 MG tablet Take 81 mg by mouth daily. Swallow whole.   atenolol (TENORMIN) 50 MG tablet TAKE 1  TABLET AT BEDTIME   cholecalciferol (VITAMIN D) 1000 UNITS tablet Take 1,000 Units by mouth daily.   clindamycin (CLEOCIN) 300 MG capsule Take 300 mg by mouth 3 (three) times daily.   clopidogrel (PLAVIX) 75 MG tablet Take 75 mg by mouth daily.   docusate sodium (COLACE) 100 MG capsule Take 200 mg by mouth daily.    DULoxetine (CYMBALTA) 60 MG capsule Take 1 capsule (60 mg total) by mouth daily.   ferrous sulfate 325 (65 FE) MG EC tablet Take 1 tablet by mouth daily.   finasteride (PROSCAR) 5 MG tablet TAKE 1 TABLET DAILY   fluticasone (FLONASE) 50 MCG/ACT nasal spray Place 2 sprays into both nostrils daily. (Patient taking differently: Place 2 sprays into both nostrils as needed for allergies.)   gabapentin (NEURONTIN) 100 MG capsule Take 2 caps in the AM,  2 caps at midday and 4 caps at bedtime (Patient taking differently: 100 mg 4 (four) times daily. Take 2 caps in the AM,  2 caps at midday and 4 caps at bedtime)   glucose blood (ONETOUCH ULTRA) test strip Use as instructed   HYDROcodone-acetaminophen (NORCO/VICODIN) 5-325 MG tablet Take 1 tablet by mouth every 6  (six) hours as needed for moderate pain.   lidocaine (LMX) 4 % cream Apply 1 Application topically as needed.   losartan (COZAAR) 100 MG tablet Take 0.5 tablets (50 mg total) by mouth 2 (two) times daily.   Melatonin 10 MG CAPS Take 10 mg by mouth at bedtime.   metFORMIN (GLUCOPHAGE-XR) 500 MG 24 hr tablet TAKE 1 TABLET TWICE A DAY  WITH MEALS   Multiple Vitamin (MULTIVITAMIN WITH MINERALS) TABS tablet Take 1 tablet by mouth daily.   OneTouch Delica Lancets 33G MISC 1 each by Does not apply route daily. Use to test blood sugar once daily.   pantoprazole (PROTONIX) 40 MG tablet Take 1 tablet (40 mg total) by mouth 2 (two) times daily.   Probiotic Product (PROBIOTIC DAILY PO) Take 1 tablet by mouth daily.   rosuvastatin (CRESTOR) 20 MG tablet TAKE 1 TABLET AT BEDTIME   scopolamine (TRANSDERM SCOP, 1.5 MG,) 1 MG/3DAYS Place 1 patch onto the skin See admin instructions. PRN for deep see fishing   sitaGLIPtin (JANUVIA) 100 MG tablet TAKE 1 TABLET DAILY   tamsulosin (FLOMAX) 0.4 MG CAPS capsule TAKE 1 CAPSULE DAILY AFTER SUPPER       05/20/2022    1:35 PM 04/29/2022    8:59 AM 04/09/2022    9:00 AM 06/20/2021    8:02 AM  GAD 7 : Generalized Anxiety Score  Nervous, Anxious, on Edge 0 0 0 0  Control/stop worrying 0 0 0 0  Worry too much - different things 0 0 0 0  Trouble relaxing 0 0 0 0  Restless 0 0 0 0  Easily annoyed or irritable 0 0 0 0  Afraid - awful might happen 0 0 0 0  Total GAD 7 Score 0 0 0 0  Anxiety Difficulty Not difficult at all Not difficult at all Not difficult at all Not difficult at all       05/20/2022    1:34 PM 04/29/2022    8:59 AM 04/09/2022    9:00 AM  Depression screen PHQ 2/9  Decreased Interest 0 0 0  Down, Depressed, Hopeless 0 0 0  PHQ - 2 Score 0 0 0  Altered sleeping 0 2 0  Tired, decreased energy 0 0 0  Change in appetite 0 0 0  Feeling bad or failure about yourself  0 0 0  Trouble concentrating 0 0 0  Moving slowly or fidgety/restless 0 0 0  Suicidal  thoughts 0 0 0  PHQ-9 Score 0 2 0  Difficult doing work/chores Not difficult at all Not difficult at all Not difficult at all    BP Readings from Last 3 Encounters:  05/20/22 (!) 142/74  05/16/22 128/60  05/15/22 (!) 141/86    Physical Exam Vitals and nursing note reviewed.  Constitutional:      General: He is in acute distress (appears moderately uncomfortable).     Appearance: He is well-developed. He is not toxic-appearing or diaphoretic.  HENT:     Head: Normocephalic and atraumatic.  Pulmonary:     Effort: Pulmonary effort is normal. No respiratory distress.  Skin:    General: Skin is warm and dry.     Findings: Rash present.          Comments: Less erythema than last week; vesicles are mostly healed.  Neurological:     Mental Status: He is alert and oriented to person, place, and time.  Psychiatric:        Mood and Affect: Mood normal.        Behavior: Behavior normal.     Wt Readings from Last 3 Encounters:  05/20/22 178 lb (80.7 kg)  05/16/22 172 lb (78 kg)  05/15/22 172 lb 14.4 oz (78.4 kg)    BP (!) 142/74   Pulse 88   Ht 6\' 2"  (1.88 m)   Wt 178 lb (80.7 kg)   SpO2 96%   BMI 22.85 kg/m   Assessment and Plan:  Problem List Items Addressed This Visit   None Visit Diagnoses     PHN (postherpetic neuralgia)    -  Primary   now three weeks since onset continue Mobic, Gabapentin, Vicodin add Lidoderm patches and Duloxetine   Relevant Medications   DULoxetine (CYMBALTA) 60 MG capsule       No follow-ups on file.   Partially dictated using Dragon software, any errors are not intentional.  Reubin Milan, MD [facility] Primary [facility] and Sports Medicine [place], [place]

## 2022-05-20 NOTE — Telephone Encounter (Signed)
  Chief Complaint: shingles Symptoms: shingles rash on L shoulder and upper chest getting worse, painful and swelling Frequency: ongoing since 04/29/22 Pertinent Negatives: Patient denies itching Disposition: [] ED /[] Urgent Care (no appt availability in office) / [x] Appointment(In office/virtual)/ []  [place] [facility]/ [] Home Care/ [] Refused Recommended Disposition /[] [place] [facility]/ []  Follow-up with PCP Additional Notes: pt states he feels like the rash is getting worse and parts are swelling and feels like pressure built up. Worst pain he's felt, taking gabapentin still and a hydrocodone at night time to help with sleep. Denies itching. Advised to be seen again since LOV 04/29/22. Pt scheduled for today at 1340 with PCP.   Summary: Shingles   Patient states that he started being treated for Shingles on 4/23 and they keep getting worse. Patient wants to know if he needs to come in again to be seen or if this will just take time to heal. Please advise.       Reason for Disposition  [1] Shingles rash AND [2] onset > 72 hours ago (3 days)  Answer Assessment - Initial Assessment Questions 1. APPEARANCE of RASH: "Describe the rash."      Blisters getting worse  2. LOCATION: "Where is the rash located?"      L shoulder and Chest  3. ONSET: "When did the rash start?"      04/29/22 4. ITCHING: "Does the rash itch?" If Yes, ask: "How bad is the itch?"  (Scale 1-10; or mild, moderate, severe)     no 5. PAIN: "Does the rash hurt?" If Yes, ask: "How bad is the pain?"  (Scale 0-10; or none, mild, moderate, severe)    - NONE (0): no pain    - MILD (1-3): doesn't interfere with normal activities     - MODERATE (4-7): interferes with normal activities or awakens from sleep     - SEVERE (8-10): excruciating pain, unable to do any normal activities     Severe  6. OTHER SYMPTOMS: "Do you have any other symptoms?" (e.g., fever)     Feels like rash getting worse and swelling and  painful  Protocols used: Shingles (Zoster)-A-AH

## 2022-05-26 DIAGNOSIS — R897 Abnormal histological findings in specimens from other organs, systems and tissues: Secondary | ICD-10-CM | POA: Diagnosis not present

## 2022-05-26 DIAGNOSIS — D2371 Other benign neoplasm of skin of right lower limb, including hip: Secondary | ICD-10-CM | POA: Diagnosis not present

## 2022-06-04 ENCOUNTER — Other Ambulatory Visit: Payer: Self-pay | Admitting: Internal Medicine

## 2022-06-04 DIAGNOSIS — K2100 Gastro-esophageal reflux disease with esophagitis, without bleeding: Secondary | ICD-10-CM

## 2022-06-11 DIAGNOSIS — L309 Dermatitis, unspecified: Secondary | ICD-10-CM | POA: Diagnosis not present

## 2022-06-11 DIAGNOSIS — C44222 Squamous cell carcinoma of skin of right ear and external auricular canal: Secondary | ICD-10-CM | POA: Diagnosis not present

## 2022-06-11 DIAGNOSIS — D485 Neoplasm of uncertain behavior of skin: Secondary | ICD-10-CM | POA: Diagnosis not present

## 2022-06-11 DIAGNOSIS — C44319 Basal cell carcinoma of skin of other parts of face: Secondary | ICD-10-CM | POA: Diagnosis not present

## 2022-06-11 DIAGNOSIS — L570 Actinic keratosis: Secondary | ICD-10-CM | POA: Diagnosis not present

## 2022-06-12 ENCOUNTER — Other Ambulatory Visit: Payer: Self-pay | Admitting: Internal Medicine

## 2022-06-12 DIAGNOSIS — G8929 Other chronic pain: Secondary | ICD-10-CM | POA: Diagnosis not present

## 2022-06-12 DIAGNOSIS — M1612 Unilateral primary osteoarthritis, left hip: Secondary | ICD-10-CM | POA: Diagnosis not present

## 2022-06-12 DIAGNOSIS — M25552 Pain in left hip: Secondary | ICD-10-CM | POA: Diagnosis not present

## 2022-06-12 DIAGNOSIS — B0229 Other postherpetic nervous system involvement: Secondary | ICD-10-CM

## 2022-06-17 DIAGNOSIS — M1612 Unilateral primary osteoarthritis, left hip: Secondary | ICD-10-CM | POA: Diagnosis not present

## 2022-06-28 ENCOUNTER — Other Ambulatory Visit: Payer: Self-pay | Admitting: Internal Medicine

## 2022-06-28 DIAGNOSIS — N401 Enlarged prostate with lower urinary tract symptoms: Secondary | ICD-10-CM

## 2022-06-28 DIAGNOSIS — I10 Essential (primary) hypertension: Secondary | ICD-10-CM

## 2022-06-29 NOTE — Discharge Instructions (Addendum)
Instructions after Total Hip Replacement   James P. Angie Fava., M.D.    Dept. of Orthopaedics & Sports Medicine [facility] [address] [place], [place]  [address]  Phone: [phone]   Fax: [phone]        [email or web address]        DIET: Drink plenty of non-alcoholic fluids. Resume your normal diet. Include foods high in fiber.  ACTIVITY:  You may use crutches or a walker with weight-bearing as tolerated, unless instructed otherwise. You may be weaned off of the walker or crutches by your Physical Therapist.  Do NOT reach below the level of your knees or cross your legs until allowed.    Continue doing gentle exercises. Exercising will reduce the pain and swelling, increase motion, and prevent muscle weakness.   Please continue to use the TED compression stockings for 6 weeks. You may remove the stockings at night, but should reapply them in the morning. Do not drive or operate any equipment until instructed.  WOUND CARE:  Continue to use ice packs periodically to reduce pain and swelling. Keep the incision clean and dry. You may bathe or shower after the staples are removed at the first office visit following surgery. The Aquacel bandage remains in place for 7 days postoperatively.  You will be sent home with honeycomb dressings to change out the Aquacel.  PT may help with this  MEDICATIONS: You may resume your regular medications. Please take the pain medication as prescribed on the medication. Do not take pain medication on an empty stomach. You will take a 81 mg aspirin twice a day to help prevent DVT.  Continue this regimen for 6 weeks Pain medications and iron supplements can cause constipation. Use a stool softener (Senokot or Colace) on a daily basis and a laxative (dulcolax or miralax) as needed. Do not drive or drink alcoholic beverages when taking pain medications.  CALL THE OFFICE FOR: Temperature above 101 degrees Excessive bleeding or  drainage on the dressing. Excessive swelling, coldness, or paleness of the toes. Persistent nausea and vomiting.  FOLLOW-UP:  You should have an appointment to return to the office in 6 weeks after surgery. Arrangements have been made for continuation of Physical Therapy (either home therapy or outpatient therapy).     [facility] Department Directory         [email or web address]       [email or web address]          Cardiology  Appointments: [place] - [phone]  Endocrinology  Appointments: [place] [phone] [place] - [phone]  Gastroenterology  Appointments: [place] [phone] [place] - [phone]        General Surgery   Appointments: [facility]  Internal Medicine/Family Medicine  Appointments: [facility] [place] - [phone] [place] - [phone]  Metabolic and Weigh Loss Surgery  Appointments: [facility]        Neurology  Appointments: [place] [phone] [place] - [phone]  Neurosurgery  Appointments: [place]  Obstetrics & Gynecology  Appointments: [place] [phone] [place] - [phone]        Pediatrics  Appointments: [place] [phone] [place] - [phone]  Physiatry  Appointments: [place] [phone]  Physical Therapy  Appointments: [place] - [phone]        Podiatry  Appointments: [place] [phone] [place] - [phone]  Pulmonology  Appointments: [place]  Rheumatology  Appointments: [place] [phone]        [place] Location: [facility]  [address] [place], [place]  [address]  [place] Location: [facility] [address] [place], [place]  [address]  [place] Location: [facility] [address] [place], [place]  [address]

## 2022-06-29 NOTE — H&P (Signed)
ORTHOPAEDIC HISTORY & PHYSICAL Maxwell Stanley, [name]., MD - 06/17/2022 4:00 PM EDT Formatting of this note is different from the original. Images from the original note were not included. Chief Complaint: Chief Complaint Patient presents with Left hip degenerative arthrosis  Reason for Visit: The patient is a 88 y.o. male who presents today for reevaluation of his left hip. He reports a long history of left hip and groin pain is significantly increased in severity over the last 2 months. The pain is worse with weight bearing. He reports some near giving way at start up. The hip pain limits the patient's ability to ambulate long distances. The patient has not appreciated any significant improvement despite Tylenol, gabapentin, activity modification, and ambulatory aids. He is using a cane for ambulation. The patient states that the hip pain has progressed to the point that it is significantly interfering with his activities of daily living.  Medications: Current Outpatient Medications Medication Sig Dispense Refill acetaminophen (TYLENOL) 500 MG tablet Take 1,000 mg by mouth 2 (two) times daily amLODIPine (NORVASC) 2.5 MG tablet TAKE 1 TABLET BY MOUTH EVERY DAY aspirin 81 MG EC tablet Take 1 tablet (81 mg total) by mouth once daily 30 tablet 3 atenolol (TENORMIN) 50 MG tablet Take 50 mg by mouth once daily. blood glucose diagnostic (ONETOUCH ULTRA TEST) test strip once daily cholecalciferol (VITAMIN D3) 1000 unit tablet Take 1,000 Units by mouth once daily docusate (COLACE) 100 MG capsule Take 100 mg by mouth 2 (two) times daily DULoxetine (CYMBALTA) 60 MG DR capsule Take 60 mg by mouth once daily ferrous sulfate 325 (65 FE) MG EC tablet Take 325 mg by mouth once daily finasteride (PROSCAR) 5 mg tablet Take 1 tablet (5 mg total) by mouth once daily fluticasone propionate (FLONASE) 50 mcg/actuation nasal spray Place 1 spray into both nostrils as needed fluticasone propionate (FLONASE) 50  mcg/actuation nasal spray Place 2 sprays into one nostril once daily gabapentin (NEURONTIN) 100 MG capsule Take 100 mg by mouth 2 (two) times daily Lactobac comb 3-FOS-pantethine 300-250 million cell-mg Cap Take 1 capsule by mouth once daily losartan (COZAAR) 100 MG tablet Take 50 mg by mouth 2 (two) times daily melatonin 10 mg Cap Take 1 capsule by mouth nightly as needed metFORMIN (GLUCOPHAGE) 500 MG tablet Take 500 mg by mouth 2 (two) times daily with meals multivitamin tablet Take 1 tablet by mouth once daily. mupirocin (BACTROBAN) 2 % ointment 1 application 2 (two) times daily. pantoprazole (PROTONIX) 40 MG DR tablet Take 1 tablet by mouth 2 (two) times daily rosuvastatin (CRESTOR) 20 MG tablet Take 20 mg by mouth once daily. SITagliptin (JANUVIA) 50 MG tablet Take 50 mg by mouth once daily tamsulosin (FLOMAX) 0.4 mg capsule Take 0.4 mg by mouth once daily Take 30 minutes after same meal each day.  No current facility-administered medications for this visit.  Allergies: Allergies Allergen Reactions Codeine Hallucination Altered mental status Penicillin G Rash Did it involve swelling of the face/tongue/throat, SOB, or low BP? No Did it involve sudden or severe rash/hives, skin peeling, or any reaction on the inside of your mouth or nose? No Did you need to seek medical attention at a hospital or doctor's office? No When did it last happen? If all above answers are "NO", may proceed with cephalosporin use. Rocephin [Ceftriaxone] Rash  Past Medical History: Past Medical History: Diagnosis Date Alcohol abuse Anemia 02/28/2021 Atrial fibrillation (CMS/HHS-HCC) 09/28/2018 Benign positional vertigo Bilateral carotid artery stenosis 05/22/2021 Bladder neck obstruction CKD stage 3 secondary  to diabetes (CMS/HHS-HCC) Diabetes mellitus type 2, uncomplicated (CMS/HHS-HCC) GERD (gastroesophageal reflux disease) Hemorrhoids Hernia History of hyperglycemia History of squamous cell  carcinoma Right ear and head. Hyperlipidemia Hypertension Motion sickness Osteoarthritis Paroxysmal A-fib (CMS/HHS-HCC) Persistent atrial fibrillation (CMS/HHS-HCC) Personal history of other malignant neoplasm of skin 12/17/2018 Type 2 diabetes mellitus with diabetic nephropathy, without long-term current use of insulin (CMS/HHS-HCC)  Past Surgical History: Past Surgical History: Procedure Laterality Date Left ankle surgery 1999 Hardware in place. Right knee surgery 2006 Squamous cell carcinoma removed 2012 Squamous cell carcinoma removed from right ear and head. HERNIA REPAIR 04/01/2010 Right thumb [facility] surgery 02/03/2011 Tendon interpositional arthroplasty [facility] joint right thumb. FUNCTIONAL ENDOSCOPIC SINUS SURGERY 03/08/2020 Right total knee arthroplasty using computer-assisted navigation 04/16/2020 Dr Ernest Pine Right total hip arthroplasty 10/21/2021 Dr Ernest Pine Hemorrhoidectomy Reattachment of detached retina TONSILLECTOMY  Social History: Social History  Socioeconomic History Marital status: Married Spouse name: Bonita Quin Number of children: 2 Years of education: 14 Occupational History Occupation: Retired- Retail banker Tobacco Use Smoking status: Never Smokeless tobacco: Never Vaping Use Vaping status: Never Used Substance and Sexual Activity Alcohol use: Never Alcohol/week: 0.0 standard drinks of alcohol Drug use: Never Sexual activity: Yes Partners: Female Birth control/protection: None  Social Determinants of Health  Financial Resource Strain: Low Risk (01/10/2022) Received from [facility], [place] Overall Financial Resource Strain (CARDIA) Difficulty of Paying Living Expenses: Not hard at all Food Insecurity: No Food Insecurity (01/10/2022) Received from [facility], [place] Hunger Vital Sign Worried About Running Out of Food in the Last Year: Never true Ran Out of Food in the Last Year: Never true Transportation Needs: No Transportation Needs  (01/10/2022) Received from [facility], [place] PRAPARE - Transportation Lack of Transportation (Medical): No Lack of Transportation (Non-Medical): No Physical Activity: Insufficiently Active (01/10/2022) Received from [facility], [place] Exercise Vital Sign Days of Exercise per Week: 4 days Minutes of Exercise per Session: 30 min Stress: No Stress Concern Present (01/10/2022) Received from [facility], [facility] of Occupational Health - Occupational Stress Questionnaire Feeling of Stress : Not at all Social Connections: Socially Integrated (01/10/2022) Received from [facility], [place] Social Connection and Isolation Panel [NHANES] Frequency of Communication with Friends and Family: More than three times a week Frequency of Social Gatherings with Friends and Family: Once a week Attends Religious Services: More than 4 times per year Active Member of [employer] or Organizations: Yes Attends Engineer, structural: More than 4 times per year Marital Status: Married  Family History: Family History Problem Relation Name Age of Onset Osteoporosis (Thinning of bones) Mother Diabetes type II Mother Gallbladder disease Mother Myocardial Infarction (Heart attack) Father HOLLIS Anesthesia problems Neg Hx Malignant hyperthermia Neg Hx  Review of Systems: A comprehensive 14 point ROS was performed, reviewed, and the pertinent orthopaedic findings are documented in the HPI.  Exam BP (!) 142/84  Ht 188 cm (6\' 2" )  Wt 77.9 kg (171 lb 12.8 oz)  BMI 22.06 kg/m  General: Well-developed, well-nourished male seen in no acute distress. Antalgic gait without evidence of significant abductor lurch.  HEENT: Atraumatic, normocephalic. Pupils are equal and reactive to light. Extraocular motion is intact. Sclera are clear. Oropharynx is clear with moist mucosa.  Neck: Supple, nontender, and with good ROM. No thyromegaly, adenopathy, JVD, or carotid  bruits.  Lungs: Clear to auscultation bilaterally.  Cardiovascular: Regular rate and rhythm. Normal S1, S2. No murmur . No appreciable gallops or rubs. Peripheral pulses are palpable. No lower extremity edema.  Homan`s test is negative.  Abdomen: Soft, nontender, nondistended. Bowel sounds are present.  Spine: Alignment: No gross scoliosis. Normal lumbar lordosis. Sacroiliac joints: Nontender to palpation Patrick`s test: Negative Flip test: Negative Tenderness: None Paraspinous spasm: None Range of motion: Fair range of motion with both flexion and extension  Extremities: Good strength, stability, and range of motion of the upper extremities. Good range of motion of the knees and ankles.  Left Hip: Pelvic tilt: Negative Limb lengths: Equal with the patient standing Soft tissue swelling: Negative Erythema: Negative Crepitance: Negative Tenderness: Greater trochanter is nontender to palpation. Moderate pain is elicited by axial compression or extremes of rotation. Atrophy: No atrophy. Fair to good hip flexor and abductor strength. Range of Motion: EXT/FLEX: 0/0/90 ADD/ABD: 20/0/20 IR/ER: 10/0/20  Vascular: Peripheral pulses are palpable. Good capillary refill. No gross pretibial or ankle edema. Homans test is negative.  Neurologic: Awake, alert, and oriented. Sensory function is intact to pinprick and light touch. Motor strength is judged to be 5/5. Motor coordination is grossly within normal limits. No apparent clonus. No tremor. Deep tendon reflexes are symmetric.  X-rays: I reviewed the left hip radiographs that were performed at [facility] on 06/12/2022. There is significant narrowing of the cartilage space with bone-on-bone articulation. Subchondral sclerosis and subchondral cyst formation are noted. Osteophyte formation is present.  Impression: Degenerative arthrosis of the left hip  Plan: The findings were discussed in detail with the patient. The patient  was given informational material on total hip replacement. Conservative treatment options were reviewed with the patient. We discussed the risks and benefits of surgical intervention. The usual perioperative course was also discussed in detail. The patient expressed understanding of the risks and benefits of surgical intervention and would like to proceed with plans for left total hip arthroplasty.  Hemoglobin A1c is an indication of glucose control. Uncontrolled diabetes has been associated with perioperative complications including poor wound healing and surgical site infections. To decrease the risk of perioperative complications, hemoglobin A1c must be less than 8.0 prior to surgery. The patient is encouraged to work with his primary care physician and/or endocrinologist to optimize glucose management.  Hemoglobin A1c was 6.9 on 04/09/2022 [facility]).  I spent a total of 40 minutes in both face-to-face and non-face-to-face activities, excluding procedures performed, for this visit on the date of this encounter.  MEDICAL CLEARANCE: Per anesthesiology ACTIVITIES: As tolerated. WORK STATUS: Not applicable. THERAPY: Preoperative physical therapy evaluation. MEDICATIONS: Requested Prescriptions  No prescriptions requested or ordered in this encounter  FOLLOW-UP: Return for preop History & Physical pending surgery date.  Burdette Forehand P. Angie Fava., M.D.  This note was generated in part with voice recognition software and I apologize for any typographical errors that were not detected and corrected. Electronically signed by Shari Heritage., MD at 06/22/2022 3:08 PM EDT

## 2022-06-30 ENCOUNTER — Other Ambulatory Visit: Payer: Self-pay

## 2022-06-30 ENCOUNTER — Encounter
Admission: RE | Admit: 2022-06-30 | Discharge: 2022-06-30 | Disposition: A | Discharge status: Home / Self Care | Payer: Medicare HMO | Source: Ambulatory Visit | Attending: Orthopedic Surgery | Admitting: Orthopedic Surgery

## 2022-06-30 VITALS — BP 126/81 | HR 68 | Resp 16 | Ht 76.0 in | Wt 175.0 lb

## 2022-06-30 DIAGNOSIS — E118 Type 2 diabetes mellitus with unspecified complications: Secondary | ICD-10-CM | POA: Insufficient documentation

## 2022-06-30 DIAGNOSIS — D509 Iron deficiency anemia, unspecified: Secondary | ICD-10-CM | POA: Insufficient documentation

## 2022-06-30 DIAGNOSIS — N183 Chronic kidney disease, stage 3 unspecified: Secondary | ICD-10-CM | POA: Insufficient documentation

## 2022-06-30 DIAGNOSIS — Z01818 Encounter for other preprocedural examination: Secondary | ICD-10-CM | POA: Insufficient documentation

## 2022-06-30 DIAGNOSIS — E1122 Type 2 diabetes mellitus with diabetic chronic kidney disease: Secondary | ICD-10-CM | POA: Insufficient documentation

## 2022-06-30 DIAGNOSIS — M1612 Unilateral primary osteoarthritis, left hip: Secondary | ICD-10-CM | POA: Diagnosis not present

## 2022-06-30 LAB — URINALYSIS, ROUTINE W REFLEX MICROSCOPIC
Bacteria, UA: NONE SEEN
Bilirubin Urine: NEGATIVE
Glucose, UA: NEGATIVE mg/dL
Hgb urine dipstick: NEGATIVE
Ketones, ur: 5 mg/dL — AB
Leukocytes,Ua: NEGATIVE
Nitrite: NEGATIVE
Protein, ur: 30 mg/dL — AB
Specific Gravity, Urine: 1.029 (ref 1.005–1.030)
pH: 5 (ref 5.0–8.0)

## 2022-06-30 LAB — CBC WITH DIFFERENTIAL/PLATELET
Abs Immature Granulocytes: 0.03 10*3/uL (ref 0.00–0.07)
Basophils Absolute: 0 10*3/uL (ref 0.0–0.1)
Basophils Relative: 1 %
Eosinophils Absolute: 0.4 10*3/uL (ref 0.0–0.5)
Eosinophils Relative: 6 %
HCT: 38.8 % — ABNORMAL LOW (ref 39.0–52.0)
Hemoglobin: 13.3 g/dL (ref 13.0–17.0)
Immature Granulocytes: 0 %
Lymphocytes Relative: 28 %
Lymphs Abs: 1.9 10*3/uL (ref 0.7–4.0)
MCH: 33.3 pg (ref 26.0–34.0)
MCHC: 34.3 g/dL (ref 30.0–36.0)
MCV: 97 fL (ref 80.0–100.0)
Monocytes Absolute: 0.6 10*3/uL (ref 0.1–1.0)
Monocytes Relative: 9 %
Neutro Abs: 3.9 10*3/uL (ref 1.7–7.7)
Neutrophils Relative %: 56 %
Platelets: 231 10*3/uL (ref 150–400)
RBC: 4 MIL/uL — ABNORMAL LOW (ref 4.22–5.81)
RDW: 13.4 % (ref 11.5–15.5)
WBC: 6.9 10*3/uL (ref 4.0–10.5)
nRBC: 0 % (ref 0.0–0.2)

## 2022-06-30 LAB — COMPREHENSIVE METABOLIC PANEL
ALT: 22 U/L (ref 0–44)
AST: 26 U/L (ref 15–41)
Albumin: 4.2 g/dL (ref 3.5–5.0)
Alkaline Phosphatase: 75 U/L (ref 38–126)
Anion gap: 8 (ref 5–15)
BUN: 23 mg/dL (ref 8–23)
CO2: 24 mmol/L (ref 22–32)
Calcium: 9.4 mg/dL (ref 8.9–10.3)
Chloride: 105 mmol/L (ref 98–111)
Creatinine, Ser: 1.28 mg/dL — ABNORMAL HIGH (ref 0.61–1.24)
GFR, Estimated: 55 mL/min — ABNORMAL LOW (ref >60)
Glucose, Bld: 153 mg/dL — ABNORMAL HIGH (ref 70–99)
Potassium: 4.4 mmol/L (ref 3.5–5.1)
Sodium: 137 mmol/L (ref 135–145)
Total Bilirubin: 0.8 mg/dL (ref 0.3–1.2)
Total Protein: 6.9 g/dL (ref 6.5–8.1)

## 2022-06-30 LAB — SURGICAL PCR SCREEN
MRSA, PCR: NEGATIVE
Staphylococcus aureus: POSITIVE — AB

## 2022-06-30 LAB — SEDIMENTATION RATE: Sed Rate: 11 mm/hr (ref 0–20)

## 2022-06-30 LAB — HEMOGLOBIN A1C
Hgb A1c MFr Bld: 6.6 % — ABNORMAL HIGH (ref 4.8–5.6)
Mean Plasma Glucose: 142.72 mg/dL

## 2022-06-30 LAB — C-REACTIVE PROTEIN: CRP: 1.5 mg/dL — ABNORMAL HIGH (ref <1.0)

## 2022-06-30 NOTE — Patient Instructions (Addendum)
Your procedure is scheduled on: Monday 07/14/22 To find out your arrival time, please call [phone] between 1PM - 3PM on:   Friday 07/11/22 Report to the Registration Desk on the 1st floor of the [facility]. Free Valet parking is available.  If your arrival time is 6:00 am, do not arrive before that time as the [facility] entrance doors do not open until 6:00 am.  REMEMBER: Instructions that are not followed completely may result in serious medical risk, up to and including death; or upon the discretion of your surgeon and anesthesiologist your surgery may need to be rescheduled.  Do not eat food after midnight the night before surgery.  No gum chewing or hard candies.  You may however, drink CLEAR liquids up to 2 hours before you are scheduled to arrive for your surgery. Do not drink anything within 2 hours of your scheduled arrival time.  Type 1 and Type 2 diabetics should only drink water.  In addition, your doctor has ordered for you to drink the provided:   Gatorade G2 Drinking this  up to two hours before surgery helps to reduce insulin resistance and improve patient outcomes. Please complete drinking 2 hours before scheduled arrival time.  One week prior to surgery: Stop Anti-inflammatories (NSAIDS) such as Advil, Aleve, Ibuprofen, Motrin, Naproxen, Naprosyn and Aspirin based products such as Excedrin, Goody's Powder, BC Powder. You may however, continue to take Tylenol if needed for pain up until the day of surgery.  Stop ANY OVER THE COUNTER supplements until after surgery. Last doses Sunday 07/06/22 may continue your iron and stool softener,  Continue taking all prescribed medications with the exception of the following: Metformin, hold for 2 days. Last dose will be Friday night 07/11/22  Follow recommendations from Cardiologist or PCP regarding stopping blood thinners. Hold aspirin as previously recommended.  TAKE ONLY THESE MEDICATIONS THE MORNING OF SURGERY WITH A SIP  OF WATER:  amLODipine (NORVASC) 2.5 MG tablet  finasteride (PROSCAR) 5 MG tablet  pantoprazole (PROTONIX) 40 MG tablet Antacid (take one the night before and one on the morning of surgery - helps to prevent nausea after surgery.  No Alcohol for 24 hours before or after surgery.  No Smoking including e-cigarettes for 24 hours before surgery.  No chewable tobacco products for at least 6 hours before surgery.  No nicotine patches on the day of surgery.  Do not use any "recreational" drugs for at least a week (preferably 2 weeks) before your surgery.  Please be advised that the combination of cocaine and anesthesia may have negative outcomes, up to and including death. If you test positive for cocaine, your surgery will be cancelled.  On the morning of surgery brush your teeth with toothpaste and water, you may rinse your mouth with mouthwash if you wish. Do not swallow any toothpaste or mouthwash.  Use CHG Soap or wipes as directed on instruction sheet. Shower daily for 5 days starting on Thursday 07/10/22  Do not wear lotions, powders, or perfumes on the day of your surgery.  Do not shave body hair from the neck down 48 hours before surgery.  Wear comfortable clothing (specific to your surgery type) to the hospital.  Do not wear jewelry, make-up, hairpins, clips or nail polish.  Contact lenses, hearing aids and dentures may not be worn into surgery.  Do not bring valuables to the hospital. [facility] is not responsible for any missing/lost belongings or valuables.   Notify your doctor if there is  any change in your medical condition (cold, fever, infection).  If you are being discharged the day of surgery, you will not be allowed to drive home. You will need a responsible individual to drive you home and stay with you for 24 hours after surgery.   If you are taking public transportation, you will need to have a responsible individual with you.  If you are being admitted to the  hospital overnight, leave your suitcase in the car. After surgery it may be brought to your room.  In case of increased patient census, it may be necessary for you, the patient, to continue your postoperative care in the Same Day Surgery department.  After surgery, you can help prevent lung complications by doing breathing exercises.  Take deep breaths and cough every 1-2 hours. Your doctor may order a device called an Incentive Spirometer to help you take deep breaths. When coughing or sneezing, hold a pillow firmly against your incision with both hands. This is called "splinting." Doing this helps protect your incision. It also decreases belly discomfort.  Surgery Visitation Policy:  Patients undergoing a surgery or procedure may have two family members or support persons with them as long as the person is not COVID-19 positive or experiencing its symptoms.   Inpatient Visitation:    Visiting hours are 7 a.m. to 8 p.m. Up to four visitors are allowed at one time in a patient room. The visitors may rotate out with other people during the day. One designated support person (adult) may remain overnight.  Please call the Pre-admissions Testing Dept. at [phone] if you have any questions about these instructions.    Pre-operative 5 CHG Bath Instructions   You can play a key role in reducing the risk of infection after surgery. Your skin needs to be as free of germs as possible. You can reduce the number of germs on your skin by washing with CHG (chlorhexidine gluconate) soap before surgery. CHG is an antiseptic soap that kills germs and continues to kill germs even after washing.   DO NOT use if you have an allergy to chlorhexidine/CHG or antibacterial soaps. If your skin becomes reddened or irritated, stop using the CHG and notify one of our RNs at [phone].   Please shower with the CHG soap starting 4 days before surgery using the following schedule:     Please keep in mind  the following:  DO NOT shave, including legs and underarms, starting the day of your first shower.   You may shave your face at any point before/day of surgery.  Place clean sheets on your bed the day you start using CHG soap. Use a clean washcloth (not used since being washed) for each shower. DO NOT sleep with pets once you start using the CHG.   CHG Shower Instructions:  If you choose to wash your hair and private area, wash first with your normal shampoo/soap.  After you use shampoo/soap, rinse your hair and body thoroughly to remove shampoo/soap residue.  Turn the water OFF and apply about 3 tablespoons (45 ml) of CHG soap to a CLEAN washcloth.  Apply CHG soap ONLY FROM YOUR NECK DOWN TO YOUR TOES (washing for 3-5 minutes)  DO NOT use CHG soap on face, private areas, open wounds, or sores.  Pay special attention to the area where your surgery is being performed.  If you are having back surgery, having someone wash your back for you may be helpful. Wait 2 minutes after CHG  soap is applied, then you may rinse off the CHG soap.  Pat dry with a clean towel  Put on clean clothes/pajamas   If you choose to wear lotion, please use ONLY the CHG-compatible lotions on the back of this paper.     Additional instructions for the day of surgery: DO NOT APPLY any lotions, deodorants, cologne, or perfumes.   Put on clean/comfortable clothes.  Brush your teeth.  Ask your nurse before applying any prescription medications to the skin.      CHG Compatible Lotions   Aveeno Moisturizing lotion  Cetaphil Moisturizing Cream  Cetaphil Moisturizing Lotion  Clairol Herbal Essence Moisturizing Lotion, Dry Skin  Clairol Herbal Essence Moisturizing Lotion, Extra Dry Skin  Clairol Herbal Essence Moisturizing Lotion, Normal Skin  Curel Age Defying Therapeutic Moisturizing Lotion with Alpha Hydroxy  Curel Extreme Care Body Lotion  Curel Soothing Hands Moisturizing Hand Lotion  Curel Therapeutic  Moisturizing Cream, Fragrance-Free  Curel Therapeutic Moisturizing Lotion, Fragrance-Free  Curel Therapeutic Moisturizing Lotion, Original Formula  Eucerin Daily Replenishing Lotion  Eucerin Dry Skin Therapy Plus Alpha Hydroxy Crme  Eucerin Dry Skin Therapy Plus Alpha Hydroxy Lotion  Eucerin Original Crme  Eucerin Original Lotion  Eucerin Plus Crme Eucerin Plus Lotion  Eucerin TriLipid Replenishing Lotion  Keri Anti-Bacterial Hand Lotion  Keri Deep Conditioning Original Lotion Dry Skin Formula Softly Scented  Keri Deep Conditioning Original Lotion, Fragrance Free Sensitive Skin Formula  Keri Lotion Fast Absorbing Fragrance Free Sensitive Skin Formula  Keri Lotion Fast Absorbing Softly Scented Dry Skin Formula  Keri Original Lotion  Keri Skin Renewal Lotion Keri Silky Smooth Lotion  Keri Silky Smooth Sensitive Skin Lotion  Nivea Body Creamy Conditioning Oil  Nivea Body Extra Enriched Lotion  Nivea Body Original Lotion  Nivea Body Sheer Moisturizing Lotion Nivea Crme  Nivea Skin Firming Lotion  NutraDerm 30 Skin Lotion  NutraDerm Skin Lotion  NutraDerm Therapeutic Skin Cream  NutraDerm Therapeutic Skin Lotion  ProShield Protective Hand Cream  Provon moisturizing lotion  How to Use an Incentive Spirometer  An incentive spirometer is a tool that measures how well you are filling your lungs with each breath. Learning to take long, deep breaths using this tool can help you keep your lungs clear and active. This may help to reverse or lessen your chance of developing breathing (pulmonary) problems, especially infection. You may be asked to use a spirometer: After a surgery. If you have a lung problem or a history of smoking. After a long period of time when you have been unable to move or be active. If the spirometer includes an indicator to show the highest number that you have reached, your health care provider or respiratory therapist will help you set a goal. Keep a log of your  progress as told by your health care provider. What are the risks? Breathing too quickly may cause dizziness or cause you to pass out. Take your time so you do not get dizzy or light-headed. If you are in pain, you may need to take pain medicine before doing incentive spirometry. It is harder to take a deep breath if you are having pain. How to use your incentive spirometer  Sit up on the edge of your bed or on a chair. Hold the incentive spirometer so that it is in an upright position. Before you use the spirometer, breathe out normally. Place the mouthpiece in your mouth. Make sure your lips are closed tightly around it. Breathe in slowly and as deeply as you  can through your mouth, causing the piston or the ball to rise toward the top of the chamber. Hold your breath for 3-5 seconds, or for as long as possible. If the spirometer includes a coach indicator, use this to guide you in breathing. Slow down your breathing if the indicator goes above the marked areas. Remove the mouthpiece from your mouth and breathe out normally. The piston or ball will return to the bottom of the chamber. Rest for a few seconds, then repeat the steps 10 or more times. Take your time and take a few normal breaths between deep breaths so that you do not get dizzy or light-headed. Do this every 1-2 hours when you are awake. If the spirometer includes a goal marker to show the highest number you have reached (best effort), use this as a goal to work toward during each repetition. After each set of 10 deep breaths, cough a few times. This will help to make sure that your lungs are clear. If you have an incision on your chest or abdomen from surgery, place a pillow or a rolled-up towel firmly against the incision when you cough. This can help to reduce pain while taking deep breaths and coughing. General tips When you are able to get out of bed: Walk around often. Continue to take deep breaths and cough in order to  clear your lungs. Keep using the incentive spirometer until your health care provider says it is okay to stop using it. If you have been in the hospital, you may be told to keep using the spirometer at home. Contact a health care provider if: You are having difficulty using the spirometer. You have trouble using the spirometer as often as instructed. Your pain medicine is not giving enough relief for you to use the spirometer as told. You have a fever. Get help right away if: You develop shortness of breath. You develop a cough with bloody mucus from the lungs. You have fluid or blood coming from an incision site after you cough. Summary An incentive spirometer is a tool that can help you learn to take long, deep breaths to keep your lungs clear and active. You may be asked to use a spirometer after a surgery, if you have a lung problem or a history of smoking, or if you have been inactive for a long period of time. Use your incentive spirometer as instructed every 1-2 hours while you are awake. If you have an incision on your chest or abdomen, place a pillow or a rolled-up towel firmly against your incision when you cough. This will help to reduce pain. Get help right away if you have shortness of breath, you cough up bloody mucus, or blood comes from your incision when you cough. This information is not intended to replace advice given to you by your health care provider. Make sure you discuss any questions you have with your health care provider. Document Revised: 03/14/2019 Document Reviewed: 03/14/2019 Elsevier Patient Education  2023 Elsevier Inc.    Preoperative Educational Videos for Total Hip, Knee and Shoulder Replacements  To better prepare for surgery, please view our videos that explain the physical activity and discharge planning required to have the best surgical recovery at [facility].  [email or web address]  Questions? Call  [phone] or email [email or web address]

## 2022-07-13 ENCOUNTER — Encounter: Payer: Self-pay | Admitting: Orthopedic Surgery

## 2022-07-13 MED ORDER — DEXAMETHASONE SODIUM PHOSPHATE 10 MG/ML IJ SOLN
8.0000 mg | Freq: Once | INTRAMUSCULAR | Status: AC
  Administered 2022-07-14: 8 mg via INTRAVENOUS

## 2022-07-13 MED ORDER — CEFAZOLIN SODIUM-DEXTROSE 2-4 GM/100ML-% IV SOLN
2.0000 g | INTRAVENOUS | Status: AC
  Administered 2022-07-14: 2 g via INTRAVENOUS

## 2022-07-13 MED ORDER — ORAL CARE MOUTH RINSE: 15.0000 mL | Freq: Once | OROMUCOSAL | Status: AC

## 2022-07-13 MED ORDER — GABAPENTIN 300 MG PO CAPS
300.0000 mg | ORAL_CAPSULE | Freq: Once | ORAL | Status: AC
  Administered 2022-07-14: 300 mg via ORAL

## 2022-07-13 MED ORDER — SODIUM CHLORIDE 0.9 % IV SOLN: INTRAVENOUS | Status: DC

## 2022-07-13 MED ORDER — CHLORHEXIDINE GLUCONATE 0.12 % MT SOLN
15.0000 mL | Freq: Once | OROMUCOSAL | Status: AC
  Administered 2022-07-14: 15 mL via OROMUCOSAL

## 2022-07-13 MED ORDER — CELECOXIB 200 MG PO CAPS
400.0000 mg | ORAL_CAPSULE | Freq: Once | ORAL | Status: AC
  Administered 2022-07-14: 400 mg via ORAL

## 2022-07-13 MED ORDER — TRANEXAMIC ACID-NACL 1000-0.7 MG/100ML-% IV SOLN
1000.0000 mg | INTRAVENOUS | Status: AC
  Administered 2022-07-14: 1000 mg via INTRAVENOUS

## 2022-07-13 MED ORDER — CHLORHEXIDINE GLUCONATE 4 % EX SOLN: 60.0000 mL | Freq: Once | CUTANEOUS | Status: DC

## 2022-07-14 ENCOUNTER — Other Ambulatory Visit: Payer: Self-pay

## 2022-07-14 ENCOUNTER — Ambulatory Visit: Payer: Medicare HMO | Admitting: Urgent Care

## 2022-07-14 ENCOUNTER — Observation Stay: Payer: Medicare HMO

## 2022-07-14 ENCOUNTER — Encounter: Admission: RE | Payer: Self-pay | Source: Home / Self Care

## 2022-07-14 ENCOUNTER — Encounter: Payer: Self-pay | Admitting: Orthopedic Surgery

## 2022-07-14 ENCOUNTER — Encounter
Admission: RE | Disposition: A | Discharge status: Home / Self Care | Payer: Self-pay | Source: Home / Self Care | Attending: Orthopedic Surgery

## 2022-07-14 ENCOUNTER — Observation Stay
Admission: RE | Admit: 2022-07-14 | Discharge: 2022-07-15 | Disposition: A | Discharge status: Home / Self Care | Payer: Medicare HMO | Attending: Orthopedic Surgery | Admitting: Orthopedic Surgery

## 2022-07-14 DIAGNOSIS — Z96651 Presence of right artificial knee joint: Secondary | ICD-10-CM | POA: Diagnosis not present

## 2022-07-14 DIAGNOSIS — M1612 Unilateral primary osteoarthritis, left hip: Principal | ICD-10-CM | POA: Insufficient documentation

## 2022-07-14 DIAGNOSIS — Z85828 Personal history of other malignant neoplasm of skin: Secondary | ICD-10-CM | POA: Insufficient documentation

## 2022-07-14 DIAGNOSIS — Z01812 Encounter for preprocedural laboratory examination: Secondary | ICD-10-CM

## 2022-07-14 DIAGNOSIS — N183 Chronic kidney disease, stage 3 unspecified: Secondary | ICD-10-CM | POA: Insufficient documentation

## 2022-07-14 DIAGNOSIS — Z7984 Long term (current) use of oral hypoglycemic drugs: Secondary | ICD-10-CM | POA: Diagnosis not present

## 2022-07-14 DIAGNOSIS — Z96642 Presence of left artificial hip joint: Secondary | ICD-10-CM

## 2022-07-14 DIAGNOSIS — Z96641 Presence of right artificial hip joint: Secondary | ICD-10-CM | POA: Diagnosis not present

## 2022-07-14 DIAGNOSIS — I129 Hypertensive chronic kidney disease with stage 1 through stage 4 chronic kidney disease, or unspecified chronic kidney disease: Secondary | ICD-10-CM | POA: Diagnosis not present

## 2022-07-14 DIAGNOSIS — Z7982 Long term (current) use of aspirin: Secondary | ICD-10-CM | POA: Insufficient documentation

## 2022-07-14 DIAGNOSIS — D509 Iron deficiency anemia, unspecified: Secondary | ICD-10-CM

## 2022-07-14 DIAGNOSIS — Z79899 Other long term (current) drug therapy: Secondary | ICD-10-CM | POA: Diagnosis not present

## 2022-07-14 DIAGNOSIS — I4891 Unspecified atrial fibrillation: Secondary | ICD-10-CM | POA: Diagnosis not present

## 2022-07-14 DIAGNOSIS — I480 Paroxysmal atrial fibrillation: Secondary | ICD-10-CM | POA: Diagnosis not present

## 2022-07-14 DIAGNOSIS — M25552 Pain in left hip: Secondary | ICD-10-CM

## 2022-07-14 DIAGNOSIS — E118 Type 2 diabetes mellitus with unspecified complications: Secondary | ICD-10-CM

## 2022-07-14 DIAGNOSIS — E1122 Type 2 diabetes mellitus with diabetic chronic kidney disease: Secondary | ICD-10-CM | POA: Diagnosis not present

## 2022-07-14 HISTORY — PX: TOTAL HIP ARTHROPLASTY: SHX124

## 2022-07-14 LAB — GLUCOSE, CAPILLARY
Glucose-Capillary: 159 mg/dL — ABNORMAL HIGH (ref 70–99)
Glucose-Capillary: 207 mg/dL — ABNORMAL HIGH (ref 70–99)
Glucose-Capillary: 219 mg/dL — ABNORMAL HIGH (ref 70–99)
Glucose-Capillary: 259 mg/dL — ABNORMAL HIGH (ref 70–99)

## 2022-07-14 LAB — TYPE AND SCREEN
ABO/RH(D): O POS
Antibody Screen: NEGATIVE

## 2022-07-14 SURGERY — ARTHROPLASTY, HIP, TOTAL,POSTERIOR APPROACH
Anesthesia: Choice | Site: Hip | Laterality: Left

## 2022-07-14 SURGERY — ARTHROPLASTY, HIP, TOTAL,POSTERIOR APPROACH
Anesthesia: Spinal | Site: Hip | Laterality: Left

## 2022-07-14 MED ORDER — EPHEDRINE 5 MG/ML INJ
INTRAVENOUS | Status: AC
  Filled 2022-07-14: qty 5

## 2022-07-14 MED ORDER — PHENYLEPHRINE 80 MCG/ML (10ML) SYRINGE FOR IV PUSH (FOR BLOOD PRESSURE SUPPORT)
PREFILLED_SYRINGE | INTRAVENOUS | Status: AC
  Filled 2022-07-14: qty 10

## 2022-07-14 MED ORDER — METOCLOPRAMIDE HCL 10 MG PO TABS
ORAL_TABLET | ORAL | Status: AC
  Filled 2022-07-14: qty 1

## 2022-07-14 MED ORDER — SENNOSIDES-DOCUSATE SODIUM 8.6-50 MG PO TABS
1.0000 | ORAL_TABLET | Freq: Two times a day (BID) | ORAL | Status: DC
  Administered 2022-07-14 – 2022-07-15 (×3): 1 via ORAL

## 2022-07-14 MED ORDER — CELECOXIB 200 MG PO CAPS
ORAL_CAPSULE | ORAL | Status: AC
  Filled 2022-07-14: qty 2

## 2022-07-14 MED ORDER — ONDANSETRON HCL 4 MG PO TABS: 4.0000 mg | ORAL_TABLET | Freq: Four times a day (QID) | ORAL | Status: DC | PRN

## 2022-07-14 MED ORDER — FENTANYL CITRATE (PF) 100 MCG/2ML IJ SOLN
INTRAMUSCULAR | Status: AC
  Filled 2022-07-14: qty 2

## 2022-07-14 MED ORDER — MIDAZOLAM HCL 5 MG/5ML IJ SOLN
INTRAMUSCULAR | Status: DC | PRN
  Administered 2022-07-14 (×2): 1 mg via INTRAVENOUS

## 2022-07-14 MED ORDER — CEFAZOLIN SODIUM-DEXTROSE 2-4 GM/100ML-% IV SOLN
INTRAVENOUS | Status: AC
  Filled 2022-07-14: qty 100

## 2022-07-14 MED ORDER — PANTOPRAZOLE SODIUM 40 MG PO TBEC
DELAYED_RELEASE_TABLET | ORAL | Status: AC
  Filled 2022-07-14: qty 1

## 2022-07-14 MED ORDER — TRANEXAMIC ACID-NACL 1000-0.7 MG/100ML-% IV SOLN
INTRAVENOUS | Status: AC
  Filled 2022-07-14: qty 100

## 2022-07-14 MED ORDER — ACETAMINOPHEN 10 MG/ML IV SOLN
INTRAVENOUS | Status: AC
  Filled 2022-07-14: qty 100

## 2022-07-14 MED ORDER — KETAMINE HCL 10 MG/ML IJ SOLN
INTRAMUSCULAR | Status: DC | PRN
  Administered 2022-07-14 (×2): 20 mg via INTRAVENOUS
  Administered 2022-07-14: 10 mg via INTRAVENOUS

## 2022-07-14 MED ORDER — PHENOL 1.4 % MT LIQD: 1.0000 | OROMUCOSAL | Status: DC | PRN

## 2022-07-14 MED ORDER — INSULIN ASPART 100 UNIT/ML IJ SOLN
INTRAMUSCULAR | Status: AC
  Filled 2022-07-14: qty 1

## 2022-07-14 MED ORDER — FLEET ENEMA 7-19 GM/118ML RE ENEM: 1.0000 | ENEMA | Freq: Once | RECTAL | Status: DC | PRN

## 2022-07-14 MED ORDER — EPHEDRINE SULFATE (PRESSORS) 50 MG/ML IJ SOLN
INTRAMUSCULAR | Status: DC | PRN
  Administered 2022-07-14: 5 mg via INTRAVENOUS
  Administered 2022-07-14 (×2): 10 mg via INTRAVENOUS

## 2022-07-14 MED ORDER — PROPOFOL 1000 MG/100ML IV EMUL
INTRAVENOUS | Status: AC
  Filled 2022-07-14: qty 100

## 2022-07-14 MED ORDER — FENTANYL CITRATE (PF) 100 MCG/2ML IJ SOLN
25.0000 ug | INTRAMUSCULAR | Status: DC | PRN
  Administered 2022-07-14 (×4): 25 ug via INTRAVENOUS

## 2022-07-14 MED ORDER — AMLODIPINE BESYLATE 5 MG PO TABS
2.5000 mg | ORAL_TABLET | Freq: Every day | ORAL | Status: DC
  Filled 2022-07-14: qty 0.5

## 2022-07-14 MED ORDER — RISAQUAD PO CAPS
1.0000 | ORAL_CAPSULE | Freq: Every day | ORAL | Status: DC
  Administered 2022-07-15: 1 via ORAL
  Filled 2022-07-14: qty 1

## 2022-07-14 MED ORDER — SURGIRINSE WOUND IRRIGATION SYSTEM - OPTIME
TOPICAL | Status: DC | PRN
  Administered 2022-07-14: 450 mL via TOPICAL

## 2022-07-14 MED ORDER — PANTOPRAZOLE SODIUM 40 MG PO TBEC
40.0000 mg | DELAYED_RELEASE_TABLET | Freq: Two times a day (BID) | ORAL | Status: DC
  Administered 2022-07-14 – 2022-07-15 (×2): 40 mg via ORAL

## 2022-07-14 MED ORDER — CELECOXIB 200 MG PO CAPS
200.0000 mg | ORAL_CAPSULE | Freq: Two times a day (BID) | ORAL | Status: DC
  Administered 2022-07-15: 200 mg via ORAL

## 2022-07-14 MED ORDER — CHLORHEXIDINE GLUCONATE 0.12 % MT SOLN
OROMUCOSAL | Status: AC
  Filled 2022-07-14: qty 15

## 2022-07-14 MED ORDER — DIPHENHYDRAMINE HCL 12.5 MG/5ML PO ELIX: 12.5000 mg | ORAL_SOLUTION | ORAL | Status: DC | PRN

## 2022-07-14 MED ORDER — FLUTICASONE PROPIONATE 50 MCG/ACT NA SUSP: 2.0000 | Freq: Every day | NASAL | Status: DC | PRN

## 2022-07-14 MED ORDER — ASPIRIN 81 MG PO CHEW
81.0000 mg | CHEWABLE_TABLET | Freq: Two times a day (BID) | ORAL | Status: DC
  Administered 2022-07-14 – 2022-07-15 (×2): 81 mg via ORAL

## 2022-07-14 MED ORDER — OXYCODONE HCL 5 MG/5ML PO SOLN: 5.0000 mg | Freq: Once | ORAL | Status: DC | PRN

## 2022-07-14 MED ORDER — LOSARTAN POTASSIUM 50 MG PO TABS
ORAL_TABLET | ORAL | Status: AC
  Filled 2022-07-14: qty 1

## 2022-07-14 MED ORDER — ACETAMINOPHEN 10 MG/ML IV SOLN
INTRAVENOUS | Status: DC | PRN
  Administered 2022-07-14: 1000 mg via INTRAVENOUS

## 2022-07-14 MED ORDER — LINAGLIPTIN 5 MG PO TABS
5.0000 mg | ORAL_TABLET | Freq: Every day | ORAL | Status: DC
  Administered 2022-07-14 – 2022-07-15 (×2): 5 mg via ORAL

## 2022-07-14 MED ORDER — ATENOLOL 50 MG PO TABS
50.0000 mg | ORAL_TABLET | Freq: Every day | ORAL | Status: DC
  Administered 2022-07-14: 50 mg via ORAL

## 2022-07-14 MED ORDER — TAMSULOSIN HCL 0.4 MG PO CAPS
ORAL_CAPSULE | ORAL | Status: AC
  Filled 2022-07-14: qty 1

## 2022-07-14 MED ORDER — TAMSULOSIN HCL 0.4 MG PO CAPS
0.4000 mg | ORAL_CAPSULE | Freq: Every day | ORAL | Status: DC
  Administered 2022-07-14: 0.4 mg via ORAL

## 2022-07-14 MED ORDER — OXYCODONE HCL 5 MG PO TABS: 5.0000 mg | ORAL_TABLET | ORAL | Status: DC | PRN

## 2022-07-14 MED ORDER — SODIUM CHLORIDE 0.9 % IR SOLN
Status: DC | PRN
  Administered 2022-07-14: 3000 mL

## 2022-07-14 MED ORDER — OXYCODONE HCL 5 MG PO TABS: 5.0000 mg | ORAL_TABLET | Freq: Once | ORAL | Status: DC | PRN

## 2022-07-14 MED ORDER — ACETAMINOPHEN 325 MG PO TABS: 325.0000 mg | ORAL_TABLET | Freq: Four times a day (QID) | ORAL | Status: DC | PRN

## 2022-07-14 MED ORDER — LOSARTAN POTASSIUM 50 MG PO TABS
50.0000 mg | ORAL_TABLET | Freq: Every day | ORAL | Status: DC
  Administered 2022-07-14: 50 mg via ORAL

## 2022-07-14 MED ORDER — METOCLOPRAMIDE HCL 10 MG PO TABS
10.0000 mg | ORAL_TABLET | Freq: Three times a day (TID) | ORAL | Status: DC
  Administered 2022-07-14 – 2022-07-15 (×4): 10 mg via ORAL

## 2022-07-14 MED ORDER — INSULIN ASPART 100 UNIT/ML IJ SOLN
0.0000 [IU] | Freq: Every day | INTRAMUSCULAR | Status: DC
  Administered 2022-07-14: 3 [IU] via SUBCUTANEOUS

## 2022-07-14 MED ORDER — LIDOCAINE HCL (PF) 2 % IJ SOLN
INTRAMUSCULAR | Status: AC
  Filled 2022-07-14: qty 5

## 2022-07-14 MED ORDER — OXYCODONE HCL 5 MG PO TABS: 10.0000 mg | ORAL_TABLET | ORAL | Status: DC | PRN

## 2022-07-14 MED ORDER — ONDANSETRON HCL 4 MG/2ML IJ SOLN
INTRAMUSCULAR | Status: AC
  Filled 2022-07-14: qty 2

## 2022-07-14 MED ORDER — MENTHOL 3 MG MT LOZG: 1.0000 | LOZENGE | OROMUCOSAL | Status: DC | PRN

## 2022-07-14 MED ORDER — PROPOFOL 10 MG/ML IV BOLUS
INTRAVENOUS | Status: AC
  Filled 2022-07-14: qty 20

## 2022-07-14 MED ORDER — MAGNESIUM HYDROXIDE 400 MG/5ML PO SUSP
30.0000 mL | Freq: Every day | ORAL | Status: DC
  Administered 2022-07-14 – 2022-07-15 (×2): 30 mL via ORAL

## 2022-07-14 MED ORDER — MIDAZOLAM HCL 2 MG/2ML IJ SOLN
INTRAMUSCULAR | Status: AC
  Filled 2022-07-14: qty 2

## 2022-07-14 MED ORDER — CEFAZOLIN SODIUM-DEXTROSE 2-4 GM/100ML-% IV SOLN
2.0000 g | Freq: Four times a day (QID) | INTRAVENOUS | Status: AC
  Administered 2022-07-14 (×2): 2 g via INTRAVENOUS

## 2022-07-14 MED ORDER — SENNOSIDES-DOCUSATE SODIUM 8.6-50 MG PO TABS
ORAL_TABLET | ORAL | Status: AC
  Filled 2022-07-14: qty 1

## 2022-07-14 MED ORDER — BUPIVACAINE HCL (PF) 0.5 % IJ SOLN
INTRAMUSCULAR | Status: DC | PRN
  Administered 2022-07-14: 3 mL via INTRATHECAL

## 2022-07-14 MED ORDER — PROPOFOL 500 MG/50ML IV EMUL
INTRAVENOUS | Status: DC | PRN
  Administered 2022-07-14: 75 ug/kg/min via INTRAVENOUS

## 2022-07-14 MED ORDER — ONDANSETRON HCL 4 MG/2ML IJ SOLN
INTRAMUSCULAR | Status: DC | PRN
  Administered 2022-07-14: 4 mg via INTRAVENOUS

## 2022-07-14 MED ORDER — TRANEXAMIC ACID-NACL 1000-0.7 MG/100ML-% IV SOLN
1000.0000 mg | Freq: Once | INTRAVENOUS | Status: AC
  Administered 2022-07-14: 1000 mg via INTRAVENOUS

## 2022-07-14 MED ORDER — ACETAMINOPHEN 325 MG PO TABS
ORAL_TABLET | ORAL | Status: AC
  Filled 2022-07-14: qty 2

## 2022-07-14 MED ORDER — ASPIRIN 81 MG PO CHEW
CHEWABLE_TABLET | ORAL | Status: AC
  Filled 2022-07-14: qty 1

## 2022-07-14 MED ORDER — PHENYLEPHRINE HCL (PRESSORS) 10 MG/ML IV SOLN
INTRAVENOUS | Status: DC | PRN
  Administered 2022-07-14: 160 ug via INTRAVENOUS
  Administered 2022-07-14 (×3): 80 ug via INTRAVENOUS
  Administered 2022-07-14 (×7): 160 ug via INTRAVENOUS

## 2022-07-14 MED ORDER — METFORMIN HCL ER 500 MG PO TB24
ORAL_TABLET | ORAL | Status: AC
  Filled 2022-07-14: qty 1

## 2022-07-14 MED ORDER — BUPIVACAINE HCL (PF) 0.5 % IJ SOLN
INTRAMUSCULAR | Status: AC
  Filled 2022-07-14: qty 10

## 2022-07-14 MED ORDER — TRAMADOL HCL 50 MG PO TABS: 50.0000 mg | ORAL_TABLET | ORAL | Status: DC | PRN

## 2022-07-14 MED ORDER — BISACODYL 10 MG RE SUPP: 10.0000 mg | Freq: Every day | RECTAL | Status: DC | PRN

## 2022-07-14 MED ORDER — ONDANSETRON HCL 4 MG/2ML IJ SOLN: 4.0000 mg | Freq: Four times a day (QID) | INTRAMUSCULAR | Status: DC | PRN

## 2022-07-14 MED ORDER — LINAGLIPTIN 5 MG PO TABS
ORAL_TABLET | ORAL | Status: AC
  Filled 2022-07-14: qty 1

## 2022-07-14 MED ORDER — FINASTERIDE 5 MG PO TABS
5.0000 mg | ORAL_TABLET | Freq: Every day | ORAL | Status: DC
  Administered 2022-07-15: 5 mg via ORAL
  Filled 2022-07-14: qty 1

## 2022-07-14 MED ORDER — ACETAMINOPHEN 10 MG/ML IV SOLN
1000.0000 mg | Freq: Four times a day (QID) | INTRAVENOUS | Status: DC
  Administered 2022-07-14 – 2022-07-15 (×3): 1000 mg via INTRAVENOUS

## 2022-07-14 MED ORDER — GABAPENTIN 300 MG PO CAPS
ORAL_CAPSULE | ORAL | Status: AC
  Filled 2022-07-14: qty 1

## 2022-07-14 MED ORDER — SODIUM CHLORIDE 0.9 % IV SOLN: INTRAVENOUS | Status: DC

## 2022-07-14 MED ORDER — DEXAMETHASONE SODIUM PHOSPHATE 10 MG/ML IJ SOLN
INTRAMUSCULAR | Status: AC
  Filled 2022-07-14: qty 1

## 2022-07-14 MED ORDER — ATENOLOL 50 MG PO TABS
ORAL_TABLET | ORAL | Status: AC
  Filled 2022-07-14: qty 1

## 2022-07-14 MED ORDER — LACTATED RINGERS IV SOLN: INTRAVENOUS | Status: DC | PRN

## 2022-07-14 MED ORDER — ROSUVASTATIN CALCIUM 20 MG PO TABS
20.0000 mg | ORAL_TABLET | Freq: Every day | ORAL | Status: DC
  Administered 2022-07-14: 20 mg via ORAL

## 2022-07-14 MED ORDER — ALUM & MAG HYDROXIDE-SIMETH 200-200-20 MG/5ML PO SUSP: 30.0000 mL | ORAL | Status: DC | PRN

## 2022-07-14 MED ORDER — HYDROMORPHONE HCL 1 MG/ML IJ SOLN: 0.5000 mg | INTRAMUSCULAR | Status: DC | PRN

## 2022-07-14 MED ORDER — KETAMINE HCL 50 MG/5ML IJ SOSY
PREFILLED_SYRINGE | INTRAMUSCULAR | Status: AC
  Filled 2022-07-14: qty 5

## 2022-07-14 MED ORDER — ROSUVASTATIN CALCIUM 20 MG PO TABS
ORAL_TABLET | ORAL | Status: AC
  Filled 2022-07-14: qty 1

## 2022-07-14 MED ORDER — METFORMIN HCL ER 500 MG PO TB24
500.0000 mg | ORAL_TABLET | Freq: Two times a day (BID) | ORAL | Status: DC
  Administered 2022-07-14 – 2022-07-15 (×2): 500 mg via ORAL

## 2022-07-14 MED ORDER — INSULIN ASPART 100 UNIT/ML IJ SOLN
0.0000 [IU] | Freq: Three times a day (TID) | INTRAMUSCULAR | Status: DC
  Administered 2022-07-14 (×2): 5 [IU] via SUBCUTANEOUS
  Administered 2022-07-15: 2 [IU] via SUBCUTANEOUS

## 2022-07-14 MED ORDER — MAGNESIUM HYDROXIDE 400 MG/5ML PO SUSP
ORAL | Status: AC
  Filled 2022-07-14: qty 30

## 2022-07-14 MED ORDER — 0.9 % SODIUM CHLORIDE (POUR BTL) OPTIME
TOPICAL | Status: DC | PRN
  Administered 2022-07-14: 1000 mL

## 2022-07-14 SURGICAL SUPPLY — 56 items
ACETAB CUP 64 PIN100 (MISCELLANEOUS) ×1
BLADE SAW 90X25X1.19 OSCILLAT (BLADE) ×1 IMPLANT
BRUSH SCRUB EZ PLAIN DRY (MISCELLANEOUS) ×1 IMPLANT
CUP ACETAB 64 PIN100 (MISCELLANEOUS) IMPLANT
DRAPE 3/4 80X56 (DRAPES) ×1 IMPLANT
DRAPE INCISE IOBAN 66X60 STRL (DRAPES) ×1 IMPLANT
DRSG AQUACEL AG ADV 3.5X14 (GAUZE/BANDAGES/DRESSINGS) ×1 IMPLANT
DRSG MEPILEX SACRM 8.7X9.8 (GAUZE/BANDAGES/DRESSINGS) ×1 IMPLANT
DRSG NON-ADHERENT DERMACEA 3X4 (GAUZE/BANDAGES/DRESSINGS) ×1 IMPLANT
DRSG TEGADERM 4X4.75 (GAUZE/BANDAGES/DRESSINGS) ×1 IMPLANT
DURAPREP 26ML APPLICATOR (WOUND CARE) ×2 IMPLANT
ELECT CAUTERY BLADE 6.4 (BLADE) ×1 IMPLANT
ELECT REM PT RETURN 9FT ADLT (ELECTROSURGICAL) ×1
ELECTRODE REM PT RTRN 9FT ADLT (ELECTROSURGICAL) ×1 IMPLANT
GLOVE BIOGEL M STRL SZ7.5 (GLOVE) ×4 IMPLANT
GLOVE SRG 8 PF TXTR STRL LF DI (GLOVE) ×2 IMPLANT
GLOVE SURG UNDER POLY LF SZ8 (GLOVE) ×2
GOWN STRL REUS W/ TWL LRG LVL3 (GOWN DISPOSABLE) ×2 IMPLANT
GOWN STRL REUS W/ TWL XL LVL3 (GOWN DISPOSABLE) ×1 IMPLANT
GOWN STRL REUS W/TWL LRG LVL3 (GOWN DISPOSABLE) ×2
GOWN STRL REUS W/TWL XL LVL3 (GOWN DISPOSABLE) ×1
GOWN TOGA ZIPPER T7+ PEEL AWAY (MISCELLANEOUS) ×1 IMPLANT
HANDLE YANKAUER SUCT OPEN TIP (MISCELLANEOUS) ×1 IMPLANT
HEAD M SROM 36MM PLUS 1.5 (Hips) IMPLANT
HEMOVAC 400CC 10FR (MISCELLANEOUS) ×1 IMPLANT
HOLDER FOLEY CATH W/STRAP (MISCELLANEOUS) ×1 IMPLANT
HOOD PEEL AWAY T7 (MISCELLANEOUS) ×1 IMPLANT
IV NS IRRIG 3000ML ARTHROMATIC (IV SOLUTION) ×1 IMPLANT
KIT PEG BOARD PINK (KITS) ×1 IMPLANT
KIT TURNOVER KIT A (KITS) ×1 IMPLANT
LINER MARA 4 10D 36X64 PL4 IMPLANT
MANIFOLD NEPTUNE II (INSTRUMENTS) ×2 IMPLANT
NS IRRIG 500ML POUR BTL (IV SOLUTION) ×1 IMPLANT
PACK HIP PROSTHESIS (MISCELLANEOUS) ×1 IMPLANT
PIN STEIN THRED 5/32 (Pin) IMPLANT
PULSAVAC PLUS IRRIG FAN TIP (DISPOSABLE) ×1
SOL PREP PVP 2OZ (MISCELLANEOUS) ×1
SOLUTION IRRIG SURGIPHOR (IV SOLUTION) ×1 IMPLANT
SOLUTION PREP PVP 2OZ (MISCELLANEOUS) ×1 IMPLANT
SPONGE DRAIN TRACH 4X4 STRL 2S (GAUZE/BANDAGES/DRESSINGS) ×1 IMPLANT
SROM M HEAD 36MM PLUS 1.5 (Hips) ×1 IMPLANT
STAPLER SKIN PROX 35W (STAPLE) ×1 IMPLANT
STEM FEM ACTIS HIGH SZ7 (Stem) IMPLANT
SUT ETHIBOND #5 BRAIDED 30INL (SUTURE) ×1 IMPLANT
SUT VIC AB 0 CT1 36 (SUTURE) ×2 IMPLANT
SUT VIC AB 1 CT1 36 (SUTURE) ×2 IMPLANT
SUT VIC AB 2-0 CT1 27 (SUTURE) ×1
SUT VIC AB 2-0 CT1 TAPERPNT 27 (SUTURE) ×1 IMPLANT
TAPE CLOTH 3X10 WHT NS LF (GAUZE/BANDAGES/DRESSINGS) ×1 IMPLANT
TIP FAN IRRIG PULSAVAC PLUS (DISPOSABLE) ×1 IMPLANT
TOWEL OR 17X26 4PK STRL BLUE (TOWEL DISPOSABLE) IMPLANT
TRAP FLUID SMOKE EVACUATOR (MISCELLANEOUS) ×1 IMPLANT
TRAY FOLEY MTR SLVR 16FR STAT (SET/KITS/TRAYS/PACK) ×1 IMPLANT
TUBING CONNECTING 10 (TUBING) ×2 IMPLANT
WATER STERILE IRR 1000ML POUR (IV SOLUTION) ×1 IMPLANT
pinnacle porocoat acetabular shell 100 series 64mm ×1 IMPLANT

## 2022-07-14 NOTE — Interval H&P Note (Signed)
History and Physical Interval Note:  07/14/2022 6:03 AM  Maxwell Stanley  has presented today for surgery, with the diagnosis of PRIMARY OSTEOARTHRITIS OF LEFT HIP.[name]  The various methods of treatment have been discussed with the patient and family. After consideration of risks, benefits and other options for treatment, the patient has consented to  Procedure(s): TOTAL HIP ARTHROPLASTY (Left) as a surgical intervention.  The patient's history has been reviewed, patient examined, no change in status, stable for surgery.  I have reviewed the patient's chart and labs.  Questions were answered to the patient's satisfaction.     [name] P Vilas Edgerly

## 2022-07-14 NOTE — Op Note (Signed)
OPERATIVE NOTE  DATE OF SURGERY:  07/14/2022  PATIENT NAME:  Maxwell Stanley   DOB: Jan 13, 1934  MRN: [id number]  PRE-OPERATIVE DIAGNOSIS: Degenerative arthrosis of the left hip, primary  POST-OPERATIVE DIAGNOSIS:  Same  PROCEDURE:  Left total hip arthroplasty  SURGEON:  Jena Gauss. M.D.  ASSISTANT:  Gean Birchwood, PA-C (present and scrubbed throughout the case, critical for assistance with exposure, retraction, instrumentation, and closure)  ANESTHESIA: spinal  ESTIMATED BLOOD LOSS: 75 mL  FLUIDS REPLACED: 1000 mL of crystalloid  DRAINS: 2 medium Hemovac drains  IMPLANTS UTILIZED: DePuy size 7 high offset Actis femoral stem, 64 mm OD Pinnacle 100 acetabular component, +4 mm degree Pinnacle Marathon polyethylene insert, and a 36 mm M-SPEC +1.5 mm hip ball  INDICATIONS FOR SURGERY: Maxwell Stanley is a 88 y.o. year old male with a long history of progressive hip and groin  pain. X-rays demonstrated severe degenerative changes. The patient had not seen any significant improvement despite conservative nonsurgical intervention. After discussion of the risks and benefits of surgical intervention, the patient expressed understanding of the risks benefits and agree with plans for total hip arthroplasty.   The risks, benefits, and alternatives were discussed at length including but not limited to the risks of infection, bleeding, nerve injury, stiffness, blood clots, the need for revision surgery, limb length inequality, dislocation, cardiopulmonary complications, among others, and they were willing to proceed.  PROCEDURE IN DETAIL: The patient was brought into the operating room and, after adequate spinal anesthesia was achieved, the patient was placed in a right lateral decubitus position. Axillary roll was placed and all bony prominences were well-padded. The patient's left hip was cleaned and prepped with alcohol and DuraPrep and draped in the usual sterile fashion. A "timeout"  was performed as per usual protocol. A lateral curvilinear incision was made gently curving towards the posterior superior iliac spine. The IT band was incised in line with the skin incision and the fibers of the gluteus maximus were split in line. The piriformis tendon was identified, skeletonized, and incised at its insertion to the proximal femur and reflected posteriorly. A T type posterior capsulotomy was performed. Prior to dislocation of the femoral head, a threaded Steinmann pin was inserted through a separate stab incision into the pelvis superior to the acetabulum and bent in the form of a stylus so as to assess limb length and hip offset throughout the procedure. The femoral head was then dislocated posteriorly. Inspection of the femoral head demonstrated severe degenerative changes with full-thickness loss of articular cartilage. The femoral neck cut was performed using an oscillating saw. The anterior capsule was elevated off of the femoral neck using a periosteal elevator. Attention was then directed to the acetabulum. The remnant of the labrum was excised using electrocautery. Inspection of the acetabulum also demonstrated significant degenerative changes. The acetabulum was reamed in sequential fashion up to a 63 mm diameter. Good punctate bleeding bone was encountered. A 64 mm Pinnacle 100 acetabular component was positioned and impacted into place. Good scratch fit was appreciated. A +4 mm neutral polyethylene trial was inserted.  Attention was then directed to the proximal femur.  Femoral broaches were inserted in a sequential fashion up to a size 7 broach. Calcar region was planed and a trial reduction was performed using a high offset neck and a 36 mm hip ball with a +1.5 mm neck length.  Reasonably good stability was noted but was elected to trial with a +4 mm 10 degree polyethylene  trial with the high side at the 4 o'clock position.  Good equalization of limb lengths and hip offset was  appreciated and excellent stability was noted both anteriorly and posteriorly. Trial components were removed. The acetabular shell was irrigated with copious amounts of normal saline with antibiotic solution and suctioned dry. A +4 mm 10 degree Pinnacle Marathon polyethylene insert was positioned the high side at the 4 o'clock position and impacted into place. Next, a size 7 high offset Actis femoral stem was positioned and impacted into place. Excellent scratch fit was appreciated. A trial reduction was again performed with a 36 mm hip ball with a +1.5 mm neck length. Again, good equalization of limb lengths was appreciated and excellent stability appreciated both anteriorly and posteriorly. The hip was then dislocated and the trial hip ball was removed. The Morse taper was cleaned and dried. A 36 mm M-SPEC hip ball with a +1.5 mm neck length was placed on the trunnion and impacted into place. The hip was then reduced and placed through range of motion. Excellent stability was appreciated both anteriorly and posteriorly.  The wound was irrigated with copious amounts of normal saline followed by 450 ml of Surgiphor and suctioned dry. Good hemostasis was appreciated. The posterior capsulotomy was repaired using #5 Ethibond. Piriformis tendon was reapproximated to the undersurface of the gluteus medius tendon using #5 Ethibond. The IT band was reapproximated using interrupted sutures of #1 Vicryl. Subcutaneous tissue was approximated using first #0 Vicryl followed by #2-0 Vicryl. The skin was closed with skin staples.  The patient tolerated the procedure well and was transported to the recovery room in stable condition.   Jena Gauss., M.D.

## 2022-07-14 NOTE — Anesthesia Procedure Notes (Signed)
Procedure Name: General with mask airway Date/Time: 07/14/2022 7:10 AM  Performed by: Lily Lovings, CRNAPre-anesthesia Checklist: Patient identified, Emergency Drugs available, Suction available, Patient being monitored and Timeout performed Patient Re-evaluated:Patient Re-evaluated prior to induction Oxygen Delivery Method: Simple face mask Preoxygenation: Pre-oxygenation with 100% oxygen

## 2022-07-14 NOTE — TOC Progression Note (Signed)
Transition of Care [facility]) - Progression Note    Patient Details  Name: Maxwell Stanley MRN: [id number] Date of Birth: February 02, 1934  [facility] CM/SW Contact  Marlowe Sax, RN Phone Number: 07/14/2022, 3:08 PM  Clinical Narrative:    The patient is set up with [facility] for [facility] prior to Surgery by the Surgeons office        Expected Discharge Plan and Services                                               Social Determinants of Health (SDOH) Interventions SDOH Screenings   Food Insecurity: No Food Insecurity (07/14/2022)  Housing: Low Risk  (07/14/2022)  Transportation Needs: No Transportation Needs (07/14/2022)  Utilities: Not At Risk (07/14/2022)  Alcohol Screen: Low Risk  (01/02/2021)  Depression (PHQ2-9): Low Risk  (05/20/2022)  Financial Resource Strain: Low Risk  (01/10/2022)  Physical Activity: Insufficiently Active (01/10/2022)  Social Connections: Socially Integrated (01/10/2022)  Stress: No Stress Concern Present (01/10/2022)  Tobacco Use: Low Risk  (07/14/2022)    Readmission Risk Interventions     No data to display

## 2022-07-14 NOTE — Progress Notes (Signed)
Physical Therapy Evaluation Patient Details Name: PERMAN YAHOLA MRN: [id number] DOB: [birth year] Today's Date: 07/14/2022  History of Present Illness  ZVI FURSE is a 88 y.o. year old male with history of progressive hip and groin pain. X-rays demonstrated severe degenerative changes. The patient had minimal improvement despite conservative management. Patient is s/p elective L THA, at date of evaluation is POD 0. PMH includes: HTN, A-fib, DM, skin CA, CKD 3, and DDD.   Clinical Impression  Patient supine in bed upon arrival, agreeable to PT evaluation. Patient is POD 0 at time of evaluation. Patient reports mild incisional pain, and tolerated functional tasks with no increase reported. PT educating on WBing precautions and posterior hip precautions, handout provided. Patient able to complete bed mobility without assistance, but increased time required. Patient able to tolerate ambulation of approx 150' and ascend/descend 4 stairs with single rail on R to simulate entrance into home environment. Patient overall requiring Min Guard with mobility at this time. Patient will benefit from skilled acute PT services to address impairments (see below for details) and return to PLOF. RN notified of mobility and patient position at end of session.       Assistance Recommended at Discharge PRN  If plan is discharge home, recommend the following:  Can travel by private vehicle  A little help with walking and/or transfers;A little help with bathing/dressing/bathroom;Help with stairs or ramp for entrance;Assist for transportation        Equipment Recommendations None recommended by PT  Recommendations for Other Services       Functional Status Assessment Patient has had a recent decline in their functional status and demonstrates the ability to make significant improvements in function in a reasonable and predictable amount of time.     Precautions / Restrictions Precautions Precautions:  Posterior Hip Precaution Booklet Issued: Yes (comment) Precaution Comments: Educated on Posterior Hip Precautions, and techniques for maintaining precautions with functional transfers/ambulation. Restrictions Weight Bearing Restrictions: Yes LLE Weight Bearing: Weight bearing as tolerated      Mobility  Bed Mobility Overal bed mobility: Needs Assistance Bed Mobility: Supine to Sit     Supine to sit: Supervision     General bed mobility comments: Supervision for bed mobility, increased time required to obtain seeated EOB.    Transfers Overall transfer level: Needs assistance Equipment used: Rolling walker (2 wheels) Transfers: Sit to/from Stand, Bed to chair/wheelchair/BSC Sit to Stand: Min guard   Step pivot transfers: Min guard       General transfer comment: cues for maintaining precautions with transfers, able to maintain consistently following demonstrating proper carryover. Min Guard for standing from EOB and transferring to Recliner.    Ambulation/Gait Ambulation/Gait assistance: Min guard Gait Distance (Feet): 150 Feet Assistive device: Rolling walker (2 wheels) Gait Pattern/deviations: Step-through pattern, Decreased step length - right, Decreased step length - left, Decreased stance time - left Gait velocity: Decreased     General Gait Details: Pt demonstrating step through pattern, shortened step length however. Good stability noted with use of RW, able to ambulate x 150 ft with CGA  Stairs Stairs: Yes Stairs assistance: Min guard Stair Management: One rail Right Number of Stairs: 4 General stair comments: Patient able to verbalize sequencing for stair negoitation, demonstrating proper sequencing and able to complete with single rail on R with CGA  Wheelchair Mobility     Tilt Bed    Modified Rankin (Stroke Patients Only)       Balance Overall balance assessment: Needs assistance  Sitting-balance support: Feet supported, No upper extremity  supported Sitting balance-Leahy Scale: Normal Sitting balance - Comments: Normal Sitting Balance at EOB   Standing balance support: During functional activity, Reliant on assistive device for balance, Bilateral upper extremity supported Standing balance-Leahy Scale: Good Standing balance comment: Reliant on RW, Min Guard for steadying intermittent with functional transfers                             Pertinent Vitals/Pain Pain Assessment Pain Assessment: 0-10 Pain Score: 5  Pain Location: L Hip Pain Descriptors / Indicators: Discomfort Pain Intervention(s): Limited activity within patient's tolerance, Monitored during session    Home Living Family/patient expects to be discharged to:: Private residence Living Arrangements: Spouse/significant other Available Help at Discharge: Family;Available 24 hours/day Type of Home: House Home Access: Stairs to enter Entrance Stairs-Rails: Right Entrance Stairs-Number of Steps: 5   Home Layout: One level Home Equipment: Cane - single point;Shower seat - built Charity fundraiser (2 wheels);Adaptive equipment      Prior Function Prior Level of Function : Independent/Modified Independent;Driving             Mobility Comments: Reports use of SPC more recently due to pain. Denies history of falls ADLs Comments: Independent with ADL/IADLs     Hand Dominance   Dominant Hand: Right    Extremity/Trunk Assessment        Lower Extremity Assessment Lower Extremity Assessment: Generalized weakness       Communication   Communication: No difficulties  Cognition Arousal/Alertness: Awake/alert Behavior During Therapy: WFL for tasks assessed/performed Overall Cognitive Status: Within Functional Limits for tasks assessed                                 General Comments: Alert and Oriented        General Comments      Exercises Other Exercises Other Exercises: Provided and educated on Total Hip HEP,  reviewed and patient able to demonstrate properly.   Assessment/Plan    PT Assessment Patient needs continued PT services  PT Problem List Decreased strength;Decreased range of motion;Decreased activity tolerance;Decreased mobility;Pain       PT Treatment Interventions Gait training;DME instruction;Stair training;Functional mobility training;Therapeutic activities;Therapeutic exercise;Patient/family education;Manual techniques    PT Goals (Current goals can be found in the Care Plan section)  Acute Rehab PT Goals Patient Stated Goal: Get to walking better PT Goal Formulation: With patient Time For Goal Achievement: 07/28/22 Potential to Achieve Goals: Good    Frequency BID     Co-evaluation               AM-PAC PT "6 Clicks" Mobility  Outcome Measure Help needed turning from your back to your side while in a flat bed without using bedrails?: A Little Help needed moving from lying on your back to sitting on the side of a flat bed without using bedrails?: A Little Help needed moving to and from a bed to a chair (including a wheelchair)?: A Little Help needed standing up from a chair using your arms (e.g., wheelchair or bedside chair)?: A Little Help needed to walk in hospital room?: A Little Help needed climbing 3-5 steps with a railing? : A Little 6 Click Score: 18    End of Session Equipment Utilized During Treatment: Gait belt Activity Tolerance: Patient tolerated treatment well Patient left: in chair;with call bell/phone within reach Nurse  Communication: Mobility status PT Visit Diagnosis: Other abnormalities of gait and mobility (R26.89);Muscle weakness (generalized) (M62.81);Difficulty in walking, not elsewhere classified (R26.2)    Time: 1610-9604 PT Time Calculation (min) (ACUTE ONLY): 36 min   Charges:   PT Evaluation $PT Eval Low Complexity: 1 Low PT Treatments $Gait Training: 8-22 mins PT General Charges $$ ACUTE PT VISIT: 1 Visit          Creed Copper Fairly, PT, DPT 07/14/22 3:05 PM

## 2022-07-14 NOTE — Progress Notes (Addendum)
Released patient sliding scale order for novolog in PACU per Dr. Karle Barr request for elevated blood glucose of 207.

## 2022-07-14 NOTE — Plan of Care (Signed)
  Problem: Pain Management: Goal: Pain level will decrease with appropriate interventions Outcome: Progressing   Problem: Skin Integrity: Goal: Will show signs of wound healing Outcome: Progressing   

## 2022-07-14 NOTE — Anesthesia Procedure Notes (Addendum)
Spinal  Patient location during procedure: OR Start time: 07/14/2022 7:15 AM End time: 07/14/2022 7:25 AM Reason for block: procedure for pain Staffing Performed: resident/CRNA  Anesthesiologist: Louie Boston, MD Resident/CRNA: Lily Lovings, CRNA Performed by: Lily Lovings, CRNA Authorized by: Louie Boston, MD   Preanesthetic Checklist Completed: patient identified, IV checked, site marked, risks and benefits discussed, surgical consent, monitors and equipment checked and pre-op evaluation Spinal Block Patient position: sitting Prep: ChloraPrep Patient monitoring: heart rate, cardiac monitor, continuous pulse ox and blood pressure Approach: midline Location: L3-4 Injection technique: single-shot Needle Needle type: Pencan and Introducer  Needle gauge: 24 G Needle length: 10 cm Assessment Events: CSF return Additional Notes 3 ml 0.5 % PF sensorcaine injected

## 2022-07-14 NOTE — Transfer of Care (Signed)
Immediate Anesthesia Transfer of Care Note  Patient: KEATS CAMERINO  Procedure(s) Performed: TOTAL HIP ARTHROPLASTY (Left: Hip)  Patient Location: PACU  Anesthesia Type:Spinal  Level of Consciousness: drowsy and patient cooperative  Airway & Oxygen Therapy: Patient Spontanous Breathing and Patient connected to face mask oxygen  Post-op Assessment: Report given to RN and Post -op Vital signs reviewed and stable  Post vital signs: Reviewed and stable  Last Vitals:  Vitals Value Taken Time  BP    Temp    Pulse 65 07/14/22 1045  Resp 11 07/14/22 1045  SpO2 98 % 07/14/22 1045  Vitals shown include unvalidated device data.  Last Pain:  Vitals:   07/14/22 0628  TempSrc: Oral         Complications: No notable events documented.

## 2022-07-14 NOTE — Anesthesia Preprocedure Evaluation (Addendum)
Anesthesia Evaluation  Patient identified by MRN, date of birth, ID band Patient awake    Reviewed: Allergy & Precautions, NPO status , Patient's Chart, lab work & pertinent test results  History of Anesthesia Complications (+) PONV and history of anesthetic complications  Airway Mallampati: III  TM Distance: >3 FB Neck ROM: full    Dental no notable dental hx.    Pulmonary neg pulmonary ROS   Pulmonary exam normal        Cardiovascular hypertension, On Medications + dysrhythmias (s/p watchman) Atrial Fibrillation   Echo 10/23  CONCLUSIONS ------------------------------------------------------------------    1. Limited study to evaluate Watchman device    2. Watchman device well seated in left atrial appendage without thrombus    or leak    3. No ASD by color Doppler    4. Normal LV function     Neuro/Psych  Neuromuscular disease  negative psych ROS   GI/Hepatic Neg liver ROS,GERD  Controlled and Medicated,,  Endo/Other  diabetes, Well Controlled, Type 2, Oral Hypoglycemic Agents    Renal/GU Renal disease     Musculoskeletal  (+) Arthritis , Osteoarthritis,    Abdominal   Peds  Hematology   Anesthesia Other Findings Past Medical History: No date: A-fib [facility])     Comment:  a.) CHA2DS2VASc = 5 (age x2, HTN, vascular disease               history, T2DM);  b.) s/p Watchman 08/21/2021; c.)               rate/rhythm maintained on oral atenolol; chronically               anticoagulated with reduced dose apixaban with plans to               transition to DAPT (ASA + clopidogrel) x 4 months               following THA on 10/21/2021 No date: Allergy No date: Aortic atherosclerosis (HCC) 11/28/2019: Basal cell carcinoma     Comment:  R neck post auricular, EDC  NODULAR AND INFILTRATIVE               PATTERNS 12/21/2019: Basal cell carcinoma     Comment:  R lateral calf, treated wiht [facility] 01/26/2020 06/19/2020: BCC  (basal cell carcinoma)     Comment:  left distal calf, EDC 08/09/2020 No date: Bilateral inguinal hernia     Comment:  a.) s/p repair No date: 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 No date: Carpal tunnel syndrome on left 11/2018: Cataract No date: Chronic back pain No date: CKD stage 3 secondary to diabetes (HCC) No date: DDD (degenerative disc disease), lumbar 11/19/2020: Diastolic dysfunction     Comment:  a.) TTE 11/19/2020: EF >55%, mild LVH, mod BAE, mild               RVE, triv AR/PR, mod MR/TR, G1DD No date: Elevated PSA No date: GERD (gastroesophageal reflux disease) 08/21/2020: History of basal cell carcinoma (BCC)     Comment:  right chest pectoral tx'd with Moh's 08/21/2020 2021: History of squamous cell carcinoma of skin     Comment:  left ear/Moh's No date: Hyperlipidemia No date: Hypertension No date: IDA (iron deficiency anemia) No date: Leg varices No date: Lumbar spinal stenosis No date: Nocturia 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 No date: Osteoarthritis No date: PONV (postoperative nausea and vomiting) 08/21/2021: Presence of Watchman left atrial appendage closure device 06/19/2020: Squamous cell carcinoma  in situ (SCCIS)     Comment:  left proximal calf, EDC 08/09/2020 11/28/2019: Squamous cell carcinoma of skin     Comment:  L ear sup helix  WELL DIFFERENTIATED No date: T2DM (type 2 diabetes mellitus) (HCC) No date: Thrombophilia (HCC) No date: Type 2 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) No date: Umbilical hernia     Comment:  a.) s/p repair  Past Surgical History: 1998: ANKLE ARTHROSCOPY WITH OPEN REDUCTION INTERNAL FIXATION (ORIF);  Left No date: COLONOSCOPY 05/28/2021: COLONOSCOPY WITH PROPOFOL; N/A     Comment:  Procedure: COLONOSCOPY WITH PROPOFOL;  Surgeon: Midge Minium, MD;  Location: [facility] ENDOSCOPY;  Service:               Endoscopy;  Laterality: N/A; 05/28/2021: ESOPHAGOGASTRODUODENOSCOPY (EGD) WITH PROPOFOL; N/A      Comment:  Procedure: ESOPHAGOGASTRODUODENOSCOPY (EGD) WITH               PROPOFOL;  Surgeon: Midge Minium, MD;  Location: [facility]               ENDOSCOPY;  Service: Endoscopy;  Laterality: N/A; 03/08/2020: ETHMOIDECTOMY; Left     Comment:  Procedure: TOTAL ETHMOIDECTOMY;  Surgeon: Vernie Murders,              MD;  Location: [facility];  Service: ENT;                Laterality: Left; 03/08/2020: FRONTAL SINUS EXPLORATION; Left     Comment:  Procedure: FRONTAL SINUS EXPLORATION;  Surgeon: Vernie Murders, MD;  Location: [facility];  Service: ENT;               Laterality: Left; 07/11/2021: GIVENS CAPSULE STUDY; N/A     Comment:  Procedure: GIVENS CAPSULE STUDY;  Surgeon: Midge Minium,              MD;  Location: [facility] ENDOSCOPY;  Service: Endoscopy;                Laterality: N/A; No date: HEMORRHOID SURGERY 03/08/2020: IMAGE GUIDED SINUS SURGERY; N/A     Comment:  Procedure: IMAGE GUIDED SINUS SURGERY;  Surgeon:               Vernie Murders, MD;  Location: [facility];                Service: ENT;  Laterality: N/A;  placed disk on OR charge              nurse desk 2-21 kp PLACED 2ND DISK ON OR CHARGE NURSE               DESK 02-28-20 KP 3RD DISK ON OR CHARGE NURSE DESK 2-25-               KP 11/12/2018: INGUINAL HERNIA REPAIR; Right     Comment:  Procedure: HERNIA REPAIR INGUINAL ADULT;  Surgeon:               Duanne Guess, MD;  Location: [facility] ORS;  Service:               General;  Laterality: Right; 2012: INGUINAL HERNIA REPAIR; Left No date: JOINT REPLACEMENT 04/16/2020: KNEE ARTHROPLASTY; Right     Comment:  Procedure: COMPUTER ASSISTED TOTAL KNEE ARTHROPLASTY;  Surgeon: Donato Heinz, MD;  Location: [facility] ORS;                Service: Orthopedics;  Laterality: Right; 2006: KNEE ARTHROSCOPY; Right 03/08/2020: MAXILLARY ANTROSTOMY; Left     Comment:  Procedure: MAXILLARY ANTROSTOMY WITH TISSUE REMOVAL;                Surgeon:  Vernie Murders, MD;  Location: [facility]               [facility];  Service: ENT;  Laterality: Left; 03/08/2020: SEPTOPLASTY; N/A     Comment:  Procedure: SEPTOPLASTY;  Surgeon: Vernie Murders, MD;                Location: [facility];  Service: ENT;                Laterality: N/A; 04/07/2018: skin cancer resection; Right     Comment:  ON hand. 2013: THUMB ARTHROSCOPY; Right     Comment:  tendon flap to joint No date: TONSILLECTOMY 10/21/2021: TOTAL HIP ARTHROPLASTY; Right     Comment:  Procedure: TOTAL HIP ARTHROPLASTY;  Surgeon: Donato Heinz, MD;  Location: [facility] ORS;  Service: Orthopedics;               Laterality: Right; 2012: UMBILICAL HERNIA REPAIR; Left No date: UPPER GI ENDOSCOPY  BMI    Body Mass Index: 21.30 kg/m      Reproductive/Obstetrics negative OB ROS                             Anesthesia Physical Anesthesia Plan  ASA: 3  Anesthesia Plan: Spinal   Post-op Pain Management: Toradol IV (intra-op)* and Ofirmev IV (intra-op)*   Induction: Intravenous  PONV Risk Score and Plan: 2 and Propofol infusion and TIVA  Airway Management Planned: Natural Airway and Nasal Cannula  Additional Equipment:   Intra-op Plan:   Post-operative Plan:   Informed Consent: I have reviewed the patients History and Physical, chart, labs and discussed the procedure including the risks, benefits and alternatives for the proposed anesthesia with the patient or authorized representative who has indicated his/her understanding and acceptance.     Dental Advisory Given  Plan Discussed with: Anesthesiologist, CRNA and Surgeon  Anesthesia Plan Comments: (Patient reports no bleeding problems and no anticoagulant use.  Plan for spinal with backup GA  Patient consented for risks of anesthesia including but not limited to:  - adverse reactions to medications - damage to eyes, teeth, lips or other oral mucosa - nerve damage due to  positioning  - risk of bleeding, infection and or nerve damage from spinal that could lead to paralysis - risk of headache or failed spinal - damage to teeth, lips or other oral mucosa - sore throat or hoarseness - damage to heart, brain, nerves, lungs, other parts of body or loss of life  Patient voiced understanding.)       Anesthesia Quick Evaluation

## 2022-07-15 ENCOUNTER — Encounter: Payer: Self-pay | Admitting: Orthopedic Surgery

## 2022-07-15 DIAGNOSIS — Z96651 Presence of right artificial knee joint: Secondary | ICD-10-CM | POA: Diagnosis not present

## 2022-07-15 DIAGNOSIS — M1612 Unilateral primary osteoarthritis, left hip: Secondary | ICD-10-CM | POA: Diagnosis not present

## 2022-07-15 DIAGNOSIS — E1122 Type 2 diabetes mellitus with diabetic chronic kidney disease: Secondary | ICD-10-CM | POA: Diagnosis not present

## 2022-07-15 DIAGNOSIS — I480 Paroxysmal atrial fibrillation: Secondary | ICD-10-CM | POA: Diagnosis not present

## 2022-07-15 DIAGNOSIS — Z7982 Long term (current) use of aspirin: Secondary | ICD-10-CM | POA: Diagnosis not present

## 2022-07-15 DIAGNOSIS — Z96641 Presence of right artificial hip joint: Secondary | ICD-10-CM | POA: Diagnosis not present

## 2022-07-15 DIAGNOSIS — N183 Chronic kidney disease, stage 3 unspecified: Secondary | ICD-10-CM | POA: Diagnosis not present

## 2022-07-15 DIAGNOSIS — Z85828 Personal history of other malignant neoplasm of skin: Secondary | ICD-10-CM | POA: Diagnosis not present

## 2022-07-15 DIAGNOSIS — Z7984 Long term (current) use of oral hypoglycemic drugs: Secondary | ICD-10-CM | POA: Diagnosis not present

## 2022-07-15 DIAGNOSIS — I129 Hypertensive chronic kidney disease with stage 1 through stage 4 chronic kidney disease, or unspecified chronic kidney disease: Secondary | ICD-10-CM | POA: Diagnosis not present

## 2022-07-15 LAB — GLUCOSE, CAPILLARY: Glucose-Capillary: 155 mg/dL — ABNORMAL HIGH (ref 70–99)

## 2022-07-15 MED ORDER — TRAMADOL HCL 50 MG PO TABS: 50.0000 mg | ORAL_TABLET | ORAL | 0 refills | Status: DC | PRN

## 2022-07-15 MED ORDER — METFORMIN HCL ER 500 MG PO TB24
ORAL_TABLET | ORAL | Status: AC
  Filled 2022-07-15: qty 1

## 2022-07-15 MED ORDER — MAGNESIUM HYDROXIDE 400 MG/5ML PO SUSP
ORAL | Status: AC
  Filled 2022-07-15: qty 30

## 2022-07-15 MED ORDER — ASPIRIN 81 MG PO CHEW
CHEWABLE_TABLET | ORAL | Status: AC
  Filled 2022-07-15: qty 1

## 2022-07-15 MED ORDER — METFORMIN HCL 500 MG PO TABS
ORAL_TABLET | ORAL | Status: AC
  Filled 2022-07-15: qty 1

## 2022-07-15 MED ORDER — ACETAMINOPHEN 10 MG/ML IV SOLN
INTRAVENOUS | Status: AC
  Filled 2022-07-15: qty 100

## 2022-07-15 MED ORDER — METOCLOPRAMIDE HCL 10 MG PO TABS
ORAL_TABLET | ORAL | Status: AC
  Filled 2022-07-15: qty 1

## 2022-07-15 MED ORDER — INSULIN ASPART 100 UNIT/ML IJ SOLN
INTRAMUSCULAR | Status: AC
  Filled 2022-07-15: qty 1

## 2022-07-15 MED ORDER — CELECOXIB 200 MG PO CAPS: 200.0000 mg | ORAL_CAPSULE | Freq: Two times a day (BID) | ORAL | 1 refills | Status: AC

## 2022-07-15 MED ORDER — OXYCODONE HCL 5 MG PO TABS: 5.0000 mg | ORAL_TABLET | ORAL | 0 refills | Status: DC | PRN

## 2022-07-15 MED ORDER — LINAGLIPTIN 5 MG PO TABS
ORAL_TABLET | ORAL | Status: AC
  Filled 2022-07-15: qty 1

## 2022-07-15 MED ORDER — ASPIRIN 81 MG PO TBEC: 81.0000 mg | DELAYED_RELEASE_TABLET | Freq: Two times a day (BID) | ORAL | 12 refills | Status: AC

## 2022-07-15 MED ORDER — SENNOSIDES-DOCUSATE SODIUM 8.6-50 MG PO TABS
ORAL_TABLET | ORAL | Status: AC
  Filled 2022-07-15: qty 1

## 2022-07-15 MED ORDER — PANTOPRAZOLE SODIUM 40 MG PO TBEC
DELAYED_RELEASE_TABLET | ORAL | Status: AC
  Filled 2022-07-15: qty 1

## 2022-07-15 MED ORDER — CELECOXIB 200 MG PO CAPS
ORAL_CAPSULE | ORAL | Status: AC
  Filled 2022-07-15: qty 1

## 2022-07-15 NOTE — Discharge Summary (Signed)
Physician Discharge Summary  Subjective: 1 Day Post-Op Procedure(s) (LRB): TOTAL HIP ARTHROPLASTY (Left) Patient reports pain as mild.   Patient seen in rounds with Dr. Ernest Pine. Patient is well, and has had no acute complaints or problems Patient is ready to go home  Physician Discharge Summary  Patient ID: Maxwell [place] MRN: [id number] DOB/AGE: May 31, 1934 88 y.o.  Admit date: 07/14/2022 Discharge date: 07/15/2022  Admission Diagnoses:  Discharge Diagnoses:  Principal Problem:   Status post total hip replacement, left   Discharged Condition: good  Hospital Course: Patient presented to the hospital on 07/14/2022 for an elective left total hip arthroplasty performed by Dr. Ernest Pine.  Patient was given 1 g of TXA and 2 g of Ancef perioperatively.  The patient tolerated surgery well, see operative details below.  Postoperatively, the patient was able to get up and work with PT the day of surgery later that afternoon.  The patient tolerated PT well, was able to ambulate about 150 feet and do steps the day of surgery.  He has continued to work well with PT.  Denies having any problems such as N/V/SOB/chest pain.  Vital signs are stable.  Patient states that he is ready to go home.  Patient stable for discharge.  PROCEDURE:  Left total hip arthroplasty   SURGEON:  Jena Gauss. M.D.   ASSISTANT:  Gean Birchwood, PA-C (present and scrubbed throughout the case, critical for assistance with exposure, retraction, instrumentation, and closure)   ANESTHESIA: spinal   ESTIMATED BLOOD LOSS: 75 mL   FLUIDS REPLACED: 1000 mL of crystalloid   DRAINS: 2 medium Hemovac drains   IMPLANTS UTILIZED: DePuy size 7 high offset Actis femoral stem, 64 mm OD Pinnacle 100 acetabular component, +4 mm degree Pinnacle Marathon polyethylene insert, and a 36 mm M-SPEC +1.5 mm hip ball   Treatments: None  Discharge Exam: Blood pressure 139/69, pulse 71, temperature 98.9 F (37.2 C), temperature source  Oral, resp. rate 16, height 6\' 4"  (1.93 m), weight 79.4 kg, SpO2 98 %.   Disposition: Home   Allergies as of 07/15/2022       Reactions   Codeine Other (See Comments)   Made his "Wild"   Ceftriaxone Rash   Penicillin G Rash   Did it involve swelling of the face/tongue/throat, SOB, or low BP? No Did it involve sudden or severe rash/hives, skin peeling, or any reaction on the inside of your mouth or nose? No Did you need to seek medical attention at a hospital or doctor's office? No When did it last happen?       If all above answers are "NO", may proceed with cephalosporin use. IgE = 35 (WNL) on 01/18/2020        Medication List     STOP taking these medications    clindamycin 300 MG capsule Commonly known as: CLEOCIN   HYDROcodone-acetaminophen 5-325 MG tablet Commonly known as: NORCO/VICODIN       TAKE these medications    acetaminophen 500 MG tablet Commonly known as: TYLENOL Take 1,000 mg by mouth 2 (two) times daily.   amLODipine 2.5 MG tablet Commonly known as: NORVASC TAKE 1 TABLET DAILY   aspirin EC 81 MG tablet Take 1 tablet (81 mg total) by mouth in the morning and at bedtime. Swallow whole. What changed: when to take this   atenolol 50 MG tablet Commonly known as: TENORMIN TAKE 1 TABLET AT BEDTIME   celecoxib 200 MG capsule Commonly known as: CELEBREX Take 1 capsule (200  mg total) by mouth 2 (two) times daily.   cholecalciferol 25 MCG (1000 UNIT) tablet Commonly known as: VITAMIN D3 Take 1,000 Units by mouth daily.   docusate sodium 100 MG capsule Commonly known as: COLACE Take 200 mg by mouth daily.   DULoxetine 60 MG capsule Commonly known as: CYMBALTA TAKE 1 CAPSULE BY MOUTH EVERY DAY   ferrous sulfate 325 (65 FE) MG EC tablet Take 1 tablet by mouth daily.   finasteride 5 MG tablet Commonly known as: PROSCAR TAKE 1 TABLET DAILY   fluticasone 50 MCG/ACT nasal spray Commonly known as: FLONASE USE 2 SPRAYS IN EACH       NOSTRIL  DAILY What changed:  when to take this reasons to take this   gabapentin 100 MG capsule Commonly known as: NEURONTIN Take 2 caps in the AM,  2 caps at midday and 4 caps at bedtime   Januvia 100 MG tablet Generic drug: sitaGLIPtin TAKE 1 TABLET DAILY   lidocaine 4 % cream Commonly known as: LMX Apply 1 Application topically as needed.   losartan 100 MG tablet Commonly known as: COZAAR TAKE 1/2 TABLET (50MG       TOTAL) TWO TIMES A DAY   Melatonin 10 MG Caps Take 10 mg by mouth at bedtime.   metFORMIN 500 MG 24 hr tablet Commonly known as: GLUCOPHAGE-XR TAKE 1 TABLET TWICE A DAY  WITH MEALS   multivitamin with minerals Tabs tablet Take 1 tablet by mouth daily.   OneTouch Delica Lancets 33G Misc 1 each by Does not apply route daily. Use to test blood sugar once daily.   OneTouch Ultra test strip Generic drug: glucose blood Use as instructed   OSTEO BI-FLEX TRIPLE STRENGTH PO Take 1 tablet by mouth daily.   oxyCODONE 5 MG immediate release tablet Commonly known as: Oxy IR/ROXICODONE Take 1 tablet (5 mg total) by mouth every 4 (four) hours as needed for moderate pain (pain score 4-6).   pantoprazole 40 MG tablet Commonly known as: PROTONIX TAKE 1 TABLET TWICE A DAY   PROBIOTIC DAILY PO Take 1 tablet by mouth daily.   rosuvastatin 20 MG tablet Commonly known as: CRESTOR TAKE 1 TABLET AT BEDTIME   tamsulosin 0.4 MG Caps capsule Commonly known as: FLOMAX TAKE 1 CAPSULE DAILY AFTER SUPPER   traMADol 50 MG tablet Commonly known as: ULTRAM Take 1-2 tablets (50-100 mg total) by mouth every 4 (four) hours as needed for moderate pain.   Transderm Scop (1.5 MG) 1 MG/3DAYS Generic drug: scopolamine Place 1 patch onto the skin See admin instructions. PRN for deep see fishing               Durable Medical Equipment  (From admission, onward)           Start     Ordered   07/14/22 1152  DME Walker rolling  Once       Question:  Patient needs a walker to  treat with the following condition  Answer:  S/P total hip arthroplasty   07/14/22 1151   07/14/22 1152  DME Bedside commode  Once       Comments: Patient is not able to walk the distance required to go the bathroom, or he/she is unable to safely negotiate stairs required to access the bathroom.  A 3in1 BSC will alleviate this problem  Question:  Patient needs a bedside commode to treat with the following condition  Answer:  S/P total hip arthroplasty   07/14/22 1151  Follow-up Information     Hooten, Illene Labrador, MD Follow up on 08/26/2022.   Specialty: Orthopedic Surgery Why: at 2:30pm Contact information: [address] [facility] [place] [address] [phone]                 Signed: Gean Birchwood 07/15/2022, 8:17 AM   Objective: Vital signs in last 24 hours: Temp:  [97.2 F (36.2 C)-98.9 F (37.2 C)] 98.9 F (37.2 C) (07/09 0732) Pulse Rate:  [62-85] 71 (07/09 0732) Resp:  [10-16] 16 (07/09 0732) BP: (112-139)/(62-75) 139/69 (07/09 0732) SpO2:  [95 %-99 %] 98 % (07/09 0732)  Intake/Output from previous day:  Intake/Output Summary (Last 24 hours) at 07/15/2022 0817 Last data filed at 07/15/2022 0452 Gross per 24 hour  Intake 1669.07 ml  Output 1400 ml  Net 269.07 ml    Intake/Output this shift: No intake/output data recorded.  Labs: No results for input(s): "HGB" in the last 72 hours. No results for input(s): "WBC", "RBC", "HCT", "PLT" in the last 72 hours. No results for input(s): "NA", "K", "CL", "CO2", "BUN", "CREATININE", "GLUCOSE", "CALCIUM" in the last 72 hours. No results for input(s): "LABPT", "INR" in the last 72 hours.  EXAM: General - Patient is Alert, Appropriate, and Oriented Extremity - Neurologically intact ABD soft Neurovascular intact Sensation intact distally Intact pulses distally Dorsiflexion/Plantar flexion intact No cellulitis present Compartment soft Dressing - dressing C/D/I and no  drainage Motor Function - intact, moving foot and toes well on exam. Patient able to SLR, plantar and dorsi flex with good strength and ROM.  Patient is neurovascularly intact through all lower leg dermatomes on his left side.  Posterior tibial pulses appreciated 2+ JP drain pulled without difficulty.  Intact  Assessment/Plan: 1 Day Post-Op Procedure(s) (LRB): TOTAL HIP ARTHROPLASTY (Left) Procedure(s) (LRB): TOTAL HIP ARTHROPLASTY (Left) Past Medical History:  Diagnosis Date   A-fib [facility])    a.) CHA2DS2VASc = 5 (age x2, HTN, vascular disease history, T2DM);  b.) s/p Watchman 08/21/2021; c.) rate/rhythm maintained on oral atenolol; chronically anticoagulated with reduced dose apixaban with plans to transition to DAPT (ASA + clopidogrel) x 4 months following THA on 10/21/2021   Allergy    Aortic atherosclerosis (HCC)    Basal cell carcinoma 11/28/2019   R neck post auricular, EDC  NODULAR AND INFILTRATIVE PATTERNS   Basal cell carcinoma 12/21/2019   R lateral calf, treated wiht EDC 01/26/2020   BCC (basal cell carcinoma) 06/19/2020   left distal calf, EDC 08/09/2020   Bilateral inguinal hernia    a.) s/p repair   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    Carpal tunnel syndrome on left    Cataract 11/2018   Chronic back pain    CKD stage 3 secondary to diabetes (HCC)    DDD (degenerative disc disease), lumbar    Diastolic dysfunction 11/19/2020   a.) TTE 11/19/2020: EF >55%, mild LVH, mod BAE, mild RVE, triv AR/PR, mod MR/TR, G1DD   Elevated PSA    GERD (gastroesophageal reflux disease)    History of basal cell carcinoma (BCC) 08/21/2020   right chest pectoral tx'd with Moh's 08/21/2020   History of squamous cell carcinoma of skin 2021   left ear/Moh's   Hyperlipidemia    Hypertension    IDA (iron deficiency anemia)    Leg varices    Lumbar spinal stenosis    Nocturia    Organic impotence    Osteoarthritis    PONV (postoperative nausea and vomiting)  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   Weight as of this encounter: 79.4 kg. Advance diet Up with therapy - continue to work with at home physical therapy on strengthening and range of motion.  Plan to transition to outpatient PT at 2 weeks  Continue to utilize ice over the incision site as this may be beneficial for swelling and inflammation Discussed sending the patient home with medications including oxycodone, tramadol for as needed pain.  Patient will also have Celebrex for swelling and inflammation.  Patient will take baby aspirin 81 mg twice daily for DVT prophylaxis.   Urged the patient to continue to wear TED hose stockings on bilateral legs for DVT prophylaxis  Aquacel bandage remains in place for 7 days postoperatively.  Patient will be sent home with honeycomb bandages to replace the Aquacel.   DVT Prophylaxis - Aspirin, TED hose JP drain pulled, intact Hip Preacutions discussed  Diet - Regular diet Follow up - in 6 weeks at [facility] orthopedics for reevaluation and new imaging Activity - WBAT Disposition - Home Condition Upon Discharge - Good   Danise Edge, PA-C Orthopaedic Surgery 07/15/2022, 8:17 AM

## 2022-07-15 NOTE — Anesthesia Postprocedure Evaluation (Signed)
Anesthesia Post Note  Patient: Maxwell [place]  Procedure(s) Performed: TOTAL HIP ARTHROPLASTY (Left: Hip)  Patient location during evaluation: Nursing Unit Anesthesia Type: Spinal Level of consciousness: awake, awake and alert and oriented Pain management: pain level controlled Vital Signs Assessment: post-procedure vital signs reviewed and stable Respiratory status: spontaneous breathing, nonlabored ventilation and respiratory function stable Cardiovascular status: blood pressure returned to baseline and stable Postop Assessment: no headache and no backache Anesthetic complications: no  No notable events documented.   Last Vitals:  Vitals:   07/15/22 0500 07/15/22 0732  BP:  139/69  Pulse:  71  Resp:  16  Temp: 36.7 C 37.2 C  SpO2:  98%    Last Pain:  Vitals:   07/15/22 0732  TempSrc: Temporal  PainSc:                  Ginger Carne

## 2022-07-15 NOTE — Progress Notes (Signed)
Physical Therapy Treatment Patient Details Name: Maxwell [place] MRN: [id number] DOB: May 14, 1934 Today's Date: 07/15/2022   History of Present Illness Maxwell [place] is a 88 y.o. year old male with history of progressive hip and groin pain. X-rays demonstrated severe degenerative changes. The patient had minimal improvement despite conservative management. Patient is s/p elective L THA, at date of evaluation is POD 0. PMH includes: HTN, A-fib, DM, skin CA, CKD 3, and DDD.    PT Comments  Pt did very well with POD1 PT session and was able to confidently ambulate with consistent cadence and minimal UE reliance, he had some minimal issue with rising from low bed setting w/o elevated UE support, but did rise with only CGA when scooted over to have a elevated arm rest available.  He showed good confidence on the steps and was able to safely ( and w/in hip precautions) negotiate up/down steps w/o issue.  Pt doing well, safe to d/c home.  Will benefit from continued PT per total hip protocols.      Assistance Recommended at Discharge PRN  If plan is discharge home, recommend the following:  Can travel by private vehicle    A little help with walking and/or transfers;A little help with bathing/dressing/bathroom;Help with stairs or ramp for entrance;Assist for transportation      Equipment Recommendations  None recommended by PT    Recommendations for Other Services       Precautions / Restrictions Precautions Precautions: Posterior Hip Restrictions Weight Bearing Restrictions: Yes LLE Weight Bearing: Weight bearing as tolerated     Mobility  Bed Mobility Overal bed mobility: Modified Independent             General bed mobility comments: Pt was able to get himself to sitting EOB w/o assist    Transfers Overall transfer level: Needs assistance Equipment used: Rolling walker (2 wheels) Transfers: Sit to/from Stand Sit to Stand: Supervision           General transfer  comment: cues for maintaining precautions with transfers, able to maintain consistently following demonstrating proper carryover. Initially struggled with getting up from standard height bed but with cuing and reset for UE se pt able to rise w/o direct assist; Min Guard for standing from EOB    Ambulation/Gait Ambulation/Gait assistance: Supervision Gait Distance (Feet): 250 Feet Assistive device: Rolling walker (2 wheels)         General Gait Details: Pt was able to assume consistent and confident cadence from the first step.  He had no increased pain, fatigue or other issues, PT encouraged him to continue using the walker so as not to over-do it but he was able to take some steps w/o RW with good confidence and no overt safety concerns   Stairs Stairs: Yes Stairs assistance: Supervision Stair Management: One rail Right Number of Stairs: 8 General stair comments: up down practice steps X 2, pt displayed good safety, awareness about sequencing and precautions.   Wheelchair Mobility     Tilt Bed    Modified Rankin (Stroke Patients Only)       Balance Overall balance assessment: Needs assistance Sitting-balance support: Feet supported, No upper extremity supported Sitting balance-Leahy Scale: Normal     Standing balance support: During functional activity, Reliant on assistive device for balance, Bilateral upper extremity supported Standing balance-Leahy Scale: Good  Cognition Arousal/Alertness: Awake/alert Behavior During Therapy: WFL for tasks assessed/performed Overall Cognitive Status: Within Functional Limits for tasks assessed                                 General Comments: Alert and Oriented        Exercises Total Joint Exercises Ankle Circles/Pumps: 10 reps, Strengthening Quad Sets: Strengthening, 10 reps Short Arc Quad: AROM, 10 reps, Strengthening Heel Slides: Strengthening, 10 reps (with resisted  leg ext) Hip ABduction/ADduction: Strengthening, 10 reps Straight Leg Raises: AROM, 10 reps    General Comments General comments (skin integrity, edema, etc.): Pt recalled 2/3 hip precautions      Pertinent Vitals/Pain Pain Assessment Pain Score: 1  Pain Location: L Hip    Home Living Family/patient expects to be discharged to:: Private residence Living Arrangements: Spouse/significant other Available Help at Discharge: Family;Available 24 hours/day Type of Home: House Home Access: Stairs to enter Entrance Stairs-Rails: Right Entrance Stairs-Number of Steps: 5   Home Layout: One level Home Equipment: Cane - single point;Shower seat - built Charity fundraiser (2 wheels);Adaptive equipment;Grab bars - toilet;Grab bars - tub/shower;Hand held shower head Additional Comments: Pt has a toilet riser he prefers to utilize    Prior Function            PT Goals (current goals can now be found in the care plan section) Progress towards PT goals: Progressing toward goals    Frequency    BID      PT Plan Current plan remains appropriate    Co-evaluation              AM-PAC PT "6 Clicks" Mobility   Outcome Measure  Help needed turning from your back to your side while in a flat bed without using bedrails?: None Help needed moving from lying on your back to sitting on the side of a flat bed without using bedrails?: None Help needed moving to and from a bed to a chair (including a wheelchair)?: A Little Help needed standing up from a chair using your arms (e.g., wheelchair or bedside chair)?: A Little Help needed to walk in hospital room?: None Help needed climbing 3-5 steps with a railing? : A Little 6 Click Score: 21    End of Session Equipment Utilized During Treatment: Gait belt Activity Tolerance: Patient tolerated treatment well Patient left: in chair;with call bell/phone within reach;with nursing/sitter in room Nurse Communication: Mobility status PT Visit  Diagnosis: Other abnormalities of gait and mobility (R26.89);Muscle weakness (generalized) (M62.81);Difficulty in walking, not elsewhere classified (R26.2)     Time: 8295-6213 PT Time Calculation (min) (ACUTE ONLY): 25 min  Charges:    $Gait Training: 8-22 mins $Therapeutic Exercise: 8-22 mins PT General Charges $$ ACUTE PT VISIT: 1 Visit                     Malachi Pro, DPT 07/15/2022, 11:02 AM

## 2022-07-15 NOTE — Evaluation (Signed)
Occupational Therapy Evaluation Patient Details Name: Maxwell Stanley MRN: [id number] DOB: [birth year] Today's Date: 07/15/2022   History of Present Illness Maxwell Stanley is a 88 y.o. year old male with history of progressive hip and groin pain. X-rays demonstrated severe degenerative changes. The patient had minimal improvement despite conservative management. Patient is s/p elective L THA, 07/14/22. PMH includes: HTN, A-fib, DM, skin CA, CKD 3, and DDD.   Clinical Impression   Chart reviewed, pt greeted in chair, agreeable to OT evaluation. Pt seen for OT evaluation this date, POD#1 from above surgery. PTA pt is mod I-I in ADL/IADL PTA. Amb with SPC PRN. Pt able to recall 1/3 posterior total hip precautions at start of session however able to verbalize demonstrate how to implement during ADL and mobility. Pt instructed in posterior total hip precautions and how to implement, self care skills, falls prevention strategies, home/routines modifications, DME/AE for LB bathing and dressing tasks, compression stocking mgt strategies  Hand out provided.Pt would benefit from additional instruction in self care skills and techniques to help maintain precautions with or without assistive devices to support recall and carryover prior to discharge. OT will continue to follow acutely.      Recommendations for follow up therapy are one component of a multi-disciplinary discharge planning process, led by the attending physician.  Recommendations may be updated based on patient status, additional functional criteria and insurance authorization.   Assistance Recommended at Discharge Intermittent Supervision/Assistance  Patient can return home with the following A little help with bathing/dressing/bathroom;Assist for transportation;Help with stairs or ramp for entrance    Functional Status Assessment  Patient has had a recent decline in their functional status and demonstrates the ability to make significant  improvements in function in a reasonable and predictable amount of time.  Equipment Recommendations  Other (comment) (pt has recommended equipment- shower seat and raised toilet with grab bars)    Recommendations for Other Services       Precautions / Restrictions Precautions Precautions: Posterior Hip Restrictions Weight Bearing Restrictions: Yes LLE Weight Bearing: Weight bearing as tolerated      Mobility Bed Mobility               General bed mobility comments: NT in recliner pre/post session    Transfers Overall transfer level: Needs assistance Equipment used: Rolling walker (2 wheels) Transfers: Sit to/from Stand Sit to Stand: Supervision                  Balance Overall balance assessment: Needs assistance Sitting-balance support: Feet supported, No upper extremity supported Sitting balance-Leahy Scale: Normal     Standing balance support: During functional activity, Reliant on assistive device for balance, Bilateral upper extremity supported Standing balance-Leahy Scale: Good                             ADL either performed or assessed with clinical judgement   ADL Overall ADL's : Needs assistance/impaired Eating/Feeding: Set up;Sitting   Grooming: Wash/dry hands;Wash/dry face;Supervision/safety;Standing Grooming Details (indicate cue type and reason): sink level with RW         Upper Body Dressing : Set up;Sitting   Lower Body Dressing: Sitting/lateral leans;Moderate assistance;Maximal assistance Lower Body Dressing Details (indicate cue type and reason): simulated, in chair Toilet Transfer: Supervision/safety;Ambulation;Rolling walker (2 wheels);BSC/3in1   Toileting- Clothing Manipulation and Hygiene: Supervision/safety;Sit to/from stand Toileting - Clothing Manipulation Details (indicate cue type and reason): anticipated  Functional mobility during ADLs: Supervision/safety;Rolling walker (2 wheels) (approx 30' 2 attempts  with RW) General ADL Comments: good safety throughout, no vcs required for technique/safety with AD/DME     Vision Baseline Vision/History: 1 Wears glasses Patient Visual Report: No change from baseline       Perception     Praxis      Pertinent Vitals/Pain Pain Assessment Pain Assessment: 0-10 Pain Score: 2  Pain Location: L Hip Pain Descriptors / Indicators: Discomfort Pain Intervention(s): Monitored during session, Ice applied     Hand Dominance Right   Extremity/Trunk Assessment Upper Extremity Assessment Upper Extremity Assessment: Overall WFL for tasks assessed   Lower Extremity Assessment Lower Extremity Assessment: Generalized weakness;LLE deficits/detail LLE Deficits / Details: s/p L THA   Cervical / Trunk Assessment Cervical / Trunk Assessment: Normal   Communication Communication Communication: No difficulties   Cognition Arousal/Alertness: Awake/alert Behavior During Therapy: WFL for tasks assessed/performed Overall Cognitive Status: Within Functional Limits for tasks assessed                                       General Comments  pt recalls 1/3 posterior hip precautions at start of session however demonstrates good adherence to precautions throughout    Exercises     Shoulder Instructions      Home Living Family/patient expects to be discharged to:: Private residence Living Arrangements: Spouse/significant other Available Help at Discharge: Family;Available 24 hours/day Type of Home: House Home Access: Stairs to enter [employer] of Steps: 5 Entrance Stairs-Rails: Right Home Layout: One level     Bathroom Shower/Tub: Producer, television/film/video: Handicapped height Bathroom Accessibility: Yes   Home Equipment: Cane - single point;Shower seat - built Charity fundraiser (2 wheels);Adaptive equipment;Grab bars - toilet;Grab bars - tub/shower;Hand held shower head Adaptive Equipment: Reacher Additional  Comments: Pt has a toilet riser he prefers to utilize      Prior Functioning/Environment Prior Level of Function : Independent/Modified Independent;Driving             Mobility Comments: SPC PRN ADLs Comments: MOD I-I with ADL/IADL        OT Problem List: Decreased activity tolerance;Decreased knowledge of use of DME or AE;Decreased knowledge of precautions      OT Treatment/Interventions: Self-care/ADL training;DME and/or AE instruction;Therapeutic activities;Balance training;Patient/family education    OT Goals(Current goals can be found in the care plan section) Acute Rehab OT Goals Patient Stated Goal: travel again OT Goal Formulation: With patient Time For Goal Achievement: 07/29/22 Potential to Achieve Goals: Good ADL Goals Pt Will Perform Grooming: with modified independence;standing Pt Will Perform Lower Body Dressing: with modified independence;with adaptive equipment;sitting/lateral leans;sit to/from stand Pt Will Transfer to Toilet: with modified independence;ambulating  OT Frequency: Min 1X/week    Co-evaluation              AM-PAC OT "6 Clicks" Daily Activity     Outcome Measure Help from another person eating meals?: None Help from another person taking care of personal grooming?: None Help from another person toileting, which includes using toliet, bedpan, or urinal?: None Help from another person bathing (including washing, rinsing, drying)?: A Little Help from another person to put on and taking off regular upper body clothing?: None Help from another person to put on and taking off regular lower body clothing?: A Lot 6 Click Score: 21   End of Session Equipment Utilized  During Treatment: Rolling walker (2 wheels)  Activity Tolerance: Patient tolerated treatment well Patient left: in chair;with call bell/phone within reach  OT Visit Diagnosis: Other abnormalities of gait and mobility (R26.89)                Time: 1610-9604 OT Time Calculation  (min): 15 min Charges:  OT General Charges $OT Visit: 1 Visit OT Evaluation $OT Eval Low Complexity: 1 Low  Oleta Mouse, OTD OTR/L  07/15/22, 10:00 AM

## 2022-07-15 NOTE — Progress Notes (Signed)
Subjective: 1 Day Post-Op Procedure(s) (LRB): TOTAL HIP ARTHROPLASTY (Left) Patient reports pain as mild.   Patient seen in rounds with Dr. Ernest Pine. Patient is well, and has had no acute complaints or problems. Patient has already been up working with PT and has done well. Denies CP, SOB, N/V or fevers. Has been able to go to the bathroom. We will continue therapy today.  Plan is to go Home after hospital stay.  Objective: Vital signs in last 24 hours: Temp:  [97.2 F (36.2 C)-98.9 F (37.2 C)] 98.9 F (37.2 C) (07/09 0732) Pulse Rate:  [62-85] 71 (07/09 0732) Resp:  [10-16] 16 (07/09 0732) BP: (112-139)/(62-75) 139/69 (07/09 0732) SpO2:  [95 %-99 %] 98 % (07/09 0732)  Intake/Output from previous day:  Intake/Output Summary (Last 24 hours) at 07/15/2022 0809 Last data filed at 07/15/2022 0452 Gross per 24 hour  Intake 1669.07 ml  Output 1400 ml  Net 269.07 ml    Intake/Output this shift: No intake/output data recorded.  Labs: No results for input(s): "HGB" in the last 72 hours. No results for input(s): "WBC", "RBC", "HCT", "PLT" in the last 72 hours. No results for input(s): "NA", "K", "CL", "CO2", "BUN", "CREATININE", "GLUCOSE", "CALCIUM" in the last 72 hours. No results for input(s): "LABPT", "INR" in the last 72 hours.  EXAM General - Patient is Alert, Appropriate, and Oriented Extremity - Neurologically intact ABD soft Neurovascular intact Sensation intact distally Intact pulses distally Dorsiflexion/Plantar flexion intact No cellulitis present Compartment soft Dressing - dressing C/D/I and no drainage Motor Function - intact, moving foot and toes well on exam. Patient able to SLR, plantar and dorsi flex with good strength and ROM.  Patient is neurovascularly intact through all lower leg dermatomes on his left side.  Posterior tibial pulses appreciated 2+ JP drain pulled without difficulty.  Intact  Past Medical History:  Diagnosis Date   A-fib [facility])    a.)  CHA2DS2VASc = 5 (age x2, HTN, vascular disease history, T2DM);  b.) s/p Watchman 08/21/2021; c.) rate/rhythm maintained on oral atenolol; chronically anticoagulated with reduced dose apixaban with plans to transition to DAPT (ASA + clopidogrel) x 4 months following THA on 10/21/2021   Allergy    Aortic atherosclerosis (HCC)    Basal cell carcinoma 11/28/2019   R neck post auricular, EDC  NODULAR AND INFILTRATIVE PATTERNS   Basal cell carcinoma 12/21/2019   R lateral calf, treated wiht EDC 01/26/2020   BCC (basal cell carcinoma) 06/19/2020   left distal calf, EDC 08/09/2020   Bilateral inguinal hernia    a.) s/p repair   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    Carpal tunnel syndrome on left    Cataract 11/2018   Chronic back pain    CKD stage 3 secondary to diabetes (HCC)    DDD (degenerative disc disease), lumbar    Diastolic dysfunction 11/19/2020   a.) TTE 11/19/2020: EF >55%, mild LVH, mod BAE, mild RVE, triv AR/PR, mod MR/TR, G1DD   Elevated PSA    GERD (gastroesophageal reflux disease)    History of basal cell carcinoma (BCC) 08/21/2020   right chest pectoral tx'd with Moh's 08/21/2020   History of squamous cell carcinoma of skin 2021   left ear/Moh's   Hyperlipidemia    Hypertension    IDA (iron deficiency anemia)    Leg varices    Lumbar spinal stenosis    Nocturia    Organic impotence    Osteoarthritis    PONV (postoperative nausea and vomiting)  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   Weight as of this encounter: 79.4 kg. Advance diet Up with therapy -continue to work with therapy today, patient has already passed his PT goals and is cleared for discharge however may be beneficial to have 1 more therapy session before discharge  Continue to utilize ice over the incision site as this may be beneficial for swelling and inflammation Discussed sending the patient home with medications including oxycodone, tramadol for as needed pain.  Patient will also have Celebrex for swelling and inflammation.  Patient will take baby aspirin 81 mg twice daily for DVT prophylaxis.  Urged the patient to continue to wear TED hose stockings on bilateral legs for DVT prophylaxis  DVT Prophylaxis - Aspirin Weight Bearing As Tolerated left leg JP drain pulled, intact Continue with therapy Hip Preacutions  Patient will follow-up with [facility] clinic orthopedics in 6 weeks  Rayburn Go, PA-C [facility] 07/15/2022, 8:09 AM

## 2022-07-15 NOTE — Plan of Care (Signed)
  Problem: Pain Management: Goal: Pain level will decrease with appropriate interventions Outcome: Progressing   Problem: Skin Integrity: Goal: Will show signs of wound healing Outcome: Progressing   

## 2022-07-15 NOTE — Progress Notes (Signed)
DISCHARGE NOTE:  Pt given discharge instructions and verbalized understanding. TED hose on both legs, 2 honeycomb dressing sent with pt. Pt wheeled to car by staff, family providing transportation.

## 2022-07-16 ENCOUNTER — Encounter: Payer: Self-pay | Admitting: Orthopedic Surgery

## 2022-07-16 DIAGNOSIS — K219 Gastro-esophageal reflux disease without esophagitis: Secondary | ICD-10-CM | POA: Diagnosis not present

## 2022-07-16 DIAGNOSIS — G5602 Carpal tunnel syndrome, left upper limb: Secondary | ICD-10-CM | POA: Diagnosis not present

## 2022-07-16 DIAGNOSIS — D631 Anemia in chronic kidney disease: Secondary | ICD-10-CM | POA: Diagnosis not present

## 2022-07-16 DIAGNOSIS — Z7984 Long term (current) use of oral hypoglycemic drugs: Secondary | ICD-10-CM | POA: Diagnosis not present

## 2022-07-16 DIAGNOSIS — Z791 Long term (current) use of non-steroidal anti-inflammatories (NSAID): Secondary | ICD-10-CM | POA: Diagnosis not present

## 2022-07-16 DIAGNOSIS — R351 Nocturia: Secondary | ICD-10-CM | POA: Diagnosis not present

## 2022-07-16 DIAGNOSIS — I6523 Occlusion and stenosis of bilateral carotid arteries: Secondary | ICD-10-CM | POA: Diagnosis not present

## 2022-07-16 DIAGNOSIS — I700 Atherosclerosis of aorta: Secondary | ICD-10-CM | POA: Diagnosis not present

## 2022-07-16 DIAGNOSIS — M5136 Other intervertebral disc degeneration, lumbar region: Secondary | ICD-10-CM | POA: Diagnosis not present

## 2022-07-16 DIAGNOSIS — G8929 Other chronic pain: Secondary | ICD-10-CM | POA: Diagnosis not present

## 2022-07-16 DIAGNOSIS — E1122 Type 2 diabetes mellitus with diabetic chronic kidney disease: Secondary | ICD-10-CM | POA: Diagnosis not present

## 2022-07-16 DIAGNOSIS — E1136 Type 2 diabetes mellitus with diabetic cataract: Secondary | ICD-10-CM | POA: Diagnosis not present

## 2022-07-16 DIAGNOSIS — Z7982 Long term (current) use of aspirin: Secondary | ICD-10-CM | POA: Diagnosis not present

## 2022-07-16 DIAGNOSIS — F1010 Alcohol abuse, uncomplicated: Secondary | ICD-10-CM | POA: Diagnosis not present

## 2022-07-16 DIAGNOSIS — E785 Hyperlipidemia, unspecified: Secondary | ICD-10-CM | POA: Diagnosis not present

## 2022-07-16 DIAGNOSIS — Z471 Aftercare following joint replacement surgery: Secondary | ICD-10-CM | POA: Diagnosis not present

## 2022-07-16 DIAGNOSIS — N401 Enlarged prostate with lower urinary tract symptoms: Secondary | ICD-10-CM | POA: Diagnosis not present

## 2022-07-16 DIAGNOSIS — N183 Chronic kidney disease, stage 3 unspecified: Secondary | ICD-10-CM | POA: Diagnosis not present

## 2022-07-16 DIAGNOSIS — I10 Essential (primary) hypertension: Secondary | ICD-10-CM | POA: Diagnosis not present

## 2022-07-16 DIAGNOSIS — K5900 Constipation, unspecified: Secondary | ICD-10-CM | POA: Diagnosis not present

## 2022-07-16 DIAGNOSIS — M48061 Spinal stenosis, lumbar region without neurogenic claudication: Secondary | ICD-10-CM | POA: Diagnosis not present

## 2022-07-16 DIAGNOSIS — I4819 Other persistent atrial fibrillation: Secondary | ICD-10-CM | POA: Diagnosis not present

## 2022-07-16 DIAGNOSIS — D509 Iron deficiency anemia, unspecified: Secondary | ICD-10-CM | POA: Diagnosis not present

## 2022-07-16 DIAGNOSIS — Z96643 Presence of artificial hip joint, bilateral: Secondary | ICD-10-CM | POA: Diagnosis not present

## 2022-07-17 DIAGNOSIS — J309 Allergic rhinitis, unspecified: Secondary | ICD-10-CM | POA: Diagnosis not present

## 2022-07-17 DIAGNOSIS — N529 Male erectile dysfunction, unspecified: Secondary | ICD-10-CM | POA: Diagnosis not present

## 2022-07-17 DIAGNOSIS — M1990 Unspecified osteoarthritis, unspecified site: Secondary | ICD-10-CM | POA: Diagnosis not present

## 2022-07-17 DIAGNOSIS — Z79891 Long term (current) use of opiate analgesic: Secondary | ICD-10-CM | POA: Diagnosis not present

## 2022-07-17 DIAGNOSIS — I10 Essential (primary) hypertension: Secondary | ICD-10-CM | POA: Diagnosis not present

## 2022-07-17 DIAGNOSIS — I25119 Atherosclerotic heart disease of native coronary artery with unspecified angina pectoris: Secondary | ICD-10-CM | POA: Diagnosis not present

## 2022-07-17 DIAGNOSIS — N400 Enlarged prostate without lower urinary tract symptoms: Secondary | ICD-10-CM | POA: Diagnosis not present

## 2022-07-17 DIAGNOSIS — K5900 Constipation, unspecified: Secondary | ICD-10-CM | POA: Diagnosis not present

## 2022-07-17 DIAGNOSIS — E1142 Type 2 diabetes mellitus with diabetic polyneuropathy: Secondary | ICD-10-CM | POA: Diagnosis not present

## 2022-07-17 DIAGNOSIS — F419 Anxiety disorder, unspecified: Secondary | ICD-10-CM | POA: Diagnosis not present

## 2022-07-18 DIAGNOSIS — M5136 Other intervertebral disc degeneration, lumbar region: Secondary | ICD-10-CM | POA: Diagnosis not present

## 2022-07-18 DIAGNOSIS — E1122 Type 2 diabetes mellitus with diabetic chronic kidney disease: Secondary | ICD-10-CM | POA: Diagnosis not present

## 2022-07-18 DIAGNOSIS — N183 Chronic kidney disease, stage 3 unspecified: Secondary | ICD-10-CM | POA: Diagnosis not present

## 2022-07-18 DIAGNOSIS — G8929 Other chronic pain: Secondary | ICD-10-CM | POA: Diagnosis not present

## 2022-07-18 DIAGNOSIS — Z96643 Presence of artificial hip joint, bilateral: Secondary | ICD-10-CM | POA: Diagnosis not present

## 2022-07-18 DIAGNOSIS — Z471 Aftercare following joint replacement surgery: Secondary | ICD-10-CM | POA: Diagnosis not present

## 2022-07-18 DIAGNOSIS — D631 Anemia in chronic kidney disease: Secondary | ICD-10-CM | POA: Diagnosis not present

## 2022-07-18 DIAGNOSIS — I10 Essential (primary) hypertension: Secondary | ICD-10-CM | POA: Diagnosis not present

## 2022-07-18 DIAGNOSIS — I4819 Other persistent atrial fibrillation: Secondary | ICD-10-CM | POA: Diagnosis not present

## 2022-07-18 DIAGNOSIS — E1136 Type 2 diabetes mellitus with diabetic cataract: Secondary | ICD-10-CM | POA: Diagnosis not present

## 2022-07-21 DIAGNOSIS — I10 Essential (primary) hypertension: Secondary | ICD-10-CM | POA: Diagnosis not present

## 2022-07-21 DIAGNOSIS — N183 Chronic kidney disease, stage 3 unspecified: Secondary | ICD-10-CM | POA: Diagnosis not present

## 2022-07-21 DIAGNOSIS — G8929 Other chronic pain: Secondary | ICD-10-CM | POA: Diagnosis not present

## 2022-07-21 DIAGNOSIS — M5136 Other intervertebral disc degeneration, lumbar region: Secondary | ICD-10-CM | POA: Diagnosis not present

## 2022-07-21 DIAGNOSIS — I4819 Other persistent atrial fibrillation: Secondary | ICD-10-CM | POA: Diagnosis not present

## 2022-07-21 DIAGNOSIS — E1136 Type 2 diabetes mellitus with diabetic cataract: Secondary | ICD-10-CM | POA: Diagnosis not present

## 2022-07-21 DIAGNOSIS — Z471 Aftercare following joint replacement surgery: Secondary | ICD-10-CM | POA: Diagnosis not present

## 2022-07-21 DIAGNOSIS — Z96643 Presence of artificial hip joint, bilateral: Secondary | ICD-10-CM | POA: Diagnosis not present

## 2022-07-21 DIAGNOSIS — D631 Anemia in chronic kidney disease: Secondary | ICD-10-CM | POA: Diagnosis not present

## 2022-07-21 DIAGNOSIS — E1122 Type 2 diabetes mellitus with diabetic chronic kidney disease: Secondary | ICD-10-CM | POA: Diagnosis not present

## 2022-07-24 DIAGNOSIS — I10 Essential (primary) hypertension: Secondary | ICD-10-CM | POA: Diagnosis not present

## 2022-07-24 DIAGNOSIS — E1136 Type 2 diabetes mellitus with diabetic cataract: Secondary | ICD-10-CM | POA: Diagnosis not present

## 2022-07-24 DIAGNOSIS — M5136 Other intervertebral disc degeneration, lumbar region: Secondary | ICD-10-CM | POA: Diagnosis not present

## 2022-07-24 DIAGNOSIS — E1122 Type 2 diabetes mellitus with diabetic chronic kidney disease: Secondary | ICD-10-CM | POA: Diagnosis not present

## 2022-07-24 DIAGNOSIS — G8929 Other chronic pain: Secondary | ICD-10-CM | POA: Diagnosis not present

## 2022-07-24 DIAGNOSIS — I4819 Other persistent atrial fibrillation: Secondary | ICD-10-CM | POA: Diagnosis not present

## 2022-07-24 DIAGNOSIS — D631 Anemia in chronic kidney disease: Secondary | ICD-10-CM | POA: Diagnosis not present

## 2022-07-24 DIAGNOSIS — Z471 Aftercare following joint replacement surgery: Secondary | ICD-10-CM | POA: Diagnosis not present

## 2022-07-24 DIAGNOSIS — N183 Chronic kidney disease, stage 3 unspecified: Secondary | ICD-10-CM | POA: Diagnosis not present

## 2022-07-24 DIAGNOSIS — Z96643 Presence of artificial hip joint, bilateral: Secondary | ICD-10-CM | POA: Diagnosis not present

## 2022-07-28 DIAGNOSIS — M5136 Other intervertebral disc degeneration, lumbar region: Secondary | ICD-10-CM | POA: Diagnosis not present

## 2022-07-28 DIAGNOSIS — Z471 Aftercare following joint replacement surgery: Secondary | ICD-10-CM | POA: Diagnosis not present

## 2022-07-28 DIAGNOSIS — Z96643 Presence of artificial hip joint, bilateral: Secondary | ICD-10-CM | POA: Diagnosis not present

## 2022-07-28 DIAGNOSIS — E1122 Type 2 diabetes mellitus with diabetic chronic kidney disease: Secondary | ICD-10-CM | POA: Diagnosis not present

## 2022-07-28 DIAGNOSIS — I4819 Other persistent atrial fibrillation: Secondary | ICD-10-CM | POA: Diagnosis not present

## 2022-07-28 DIAGNOSIS — E1136 Type 2 diabetes mellitus with diabetic cataract: Secondary | ICD-10-CM | POA: Diagnosis not present

## 2022-07-28 DIAGNOSIS — N183 Chronic kidney disease, stage 3 unspecified: Secondary | ICD-10-CM | POA: Diagnosis not present

## 2022-07-28 DIAGNOSIS — I10 Essential (primary) hypertension: Secondary | ICD-10-CM | POA: Diagnosis not present

## 2022-07-28 DIAGNOSIS — G8929 Other chronic pain: Secondary | ICD-10-CM | POA: Diagnosis not present

## 2022-07-28 DIAGNOSIS — D631 Anemia in chronic kidney disease: Secondary | ICD-10-CM | POA: Diagnosis not present

## 2022-07-31 DIAGNOSIS — M5136 Other intervertebral disc degeneration, lumbar region: Secondary | ICD-10-CM | POA: Diagnosis not present

## 2022-07-31 DIAGNOSIS — G8929 Other chronic pain: Secondary | ICD-10-CM | POA: Diagnosis not present

## 2022-07-31 DIAGNOSIS — Z471 Aftercare following joint replacement surgery: Secondary | ICD-10-CM | POA: Diagnosis not present

## 2022-07-31 DIAGNOSIS — I10 Essential (primary) hypertension: Secondary | ICD-10-CM | POA: Diagnosis not present

## 2022-07-31 DIAGNOSIS — E1136 Type 2 diabetes mellitus with diabetic cataract: Secondary | ICD-10-CM | POA: Diagnosis not present

## 2022-07-31 DIAGNOSIS — N183 Chronic kidney disease, stage 3 unspecified: Secondary | ICD-10-CM | POA: Diagnosis not present

## 2022-07-31 DIAGNOSIS — E1122 Type 2 diabetes mellitus with diabetic chronic kidney disease: Secondary | ICD-10-CM | POA: Diagnosis not present

## 2022-07-31 DIAGNOSIS — Z96643 Presence of artificial hip joint, bilateral: Secondary | ICD-10-CM | POA: Diagnosis not present

## 2022-07-31 DIAGNOSIS — D631 Anemia in chronic kidney disease: Secondary | ICD-10-CM | POA: Diagnosis not present

## 2022-07-31 DIAGNOSIS — I4819 Other persistent atrial fibrillation: Secondary | ICD-10-CM | POA: Diagnosis not present

## 2022-08-05 DIAGNOSIS — E119 Type 2 diabetes mellitus without complications: Secondary | ICD-10-CM | POA: Diagnosis not present

## 2022-08-05 DIAGNOSIS — M79674 Pain in right toe(s): Secondary | ICD-10-CM | POA: Diagnosis not present

## 2022-08-05 DIAGNOSIS — B351 Tinea unguium: Secondary | ICD-10-CM | POA: Diagnosis not present

## 2022-08-05 DIAGNOSIS — M79675 Pain in left toe(s): Secondary | ICD-10-CM | POA: Diagnosis not present

## 2022-08-05 DIAGNOSIS — L97511 Non-pressure chronic ulcer of other part of right foot limited to breakdown of skin: Secondary | ICD-10-CM | POA: Diagnosis not present

## 2022-08-06 DIAGNOSIS — G8929 Other chronic pain: Secondary | ICD-10-CM | POA: Diagnosis not present

## 2022-08-06 DIAGNOSIS — E1136 Type 2 diabetes mellitus with diabetic cataract: Secondary | ICD-10-CM | POA: Diagnosis not present

## 2022-08-06 DIAGNOSIS — N183 Chronic kidney disease, stage 3 unspecified: Secondary | ICD-10-CM | POA: Diagnosis not present

## 2022-08-06 DIAGNOSIS — E1122 Type 2 diabetes mellitus with diabetic chronic kidney disease: Secondary | ICD-10-CM | POA: Diagnosis not present

## 2022-08-06 DIAGNOSIS — D631 Anemia in chronic kidney disease: Secondary | ICD-10-CM | POA: Diagnosis not present

## 2022-08-06 DIAGNOSIS — I10 Essential (primary) hypertension: Secondary | ICD-10-CM | POA: Diagnosis not present

## 2022-08-06 DIAGNOSIS — I4819 Other persistent atrial fibrillation: Secondary | ICD-10-CM | POA: Diagnosis not present

## 2022-08-06 DIAGNOSIS — M5136 Other intervertebral disc degeneration, lumbar region: Secondary | ICD-10-CM | POA: Diagnosis not present

## 2022-08-06 DIAGNOSIS — Z96643 Presence of artificial hip joint, bilateral: Secondary | ICD-10-CM | POA: Diagnosis not present

## 2022-08-06 DIAGNOSIS — Z471 Aftercare following joint replacement surgery: Secondary | ICD-10-CM | POA: Diagnosis not present

## 2022-08-11 ENCOUNTER — Encounter: Payer: Self-pay | Admitting: Internal Medicine

## 2022-08-11 ENCOUNTER — Ambulatory Visit (INDEPENDENT_AMBULATORY_CARE_PROVIDER_SITE_OTHER): Payer: Medicare HMO | Admitting: Internal Medicine

## 2022-08-11 VITALS — BP 128/70 | HR 76 | Ht 76.0 in | Wt 178.0 lb

## 2022-08-11 DIAGNOSIS — E118 Type 2 diabetes mellitus with unspecified complications: Secondary | ICD-10-CM

## 2022-08-11 DIAGNOSIS — Z7984 Long term (current) use of oral hypoglycemic drugs: Secondary | ICD-10-CM | POA: Diagnosis not present

## 2022-08-11 DIAGNOSIS — G8929 Other chronic pain: Secondary | ICD-10-CM | POA: Diagnosis not present

## 2022-08-11 DIAGNOSIS — N183 Chronic kidney disease, stage 3 unspecified: Secondary | ICD-10-CM | POA: Diagnosis not present

## 2022-08-11 DIAGNOSIS — I4819 Other persistent atrial fibrillation: Secondary | ICD-10-CM | POA: Diagnosis not present

## 2022-08-11 DIAGNOSIS — E1122 Type 2 diabetes mellitus with diabetic chronic kidney disease: Secondary | ICD-10-CM | POA: Diagnosis not present

## 2022-08-11 DIAGNOSIS — M5136 Other intervertebral disc degeneration, lumbar region: Secondary | ICD-10-CM | POA: Diagnosis not present

## 2022-08-11 DIAGNOSIS — I10 Essential (primary) hypertension: Secondary | ICD-10-CM

## 2022-08-11 DIAGNOSIS — Z96643 Presence of artificial hip joint, bilateral: Secondary | ICD-10-CM | POA: Diagnosis not present

## 2022-08-11 DIAGNOSIS — E1136 Type 2 diabetes mellitus with diabetic cataract: Secondary | ICD-10-CM | POA: Diagnosis not present

## 2022-08-11 DIAGNOSIS — Z471 Aftercare following joint replacement surgery: Secondary | ICD-10-CM | POA: Diagnosis not present

## 2022-08-11 DIAGNOSIS — D631 Anemia in chronic kidney disease: Secondary | ICD-10-CM | POA: Diagnosis not present

## 2022-08-11 LAB — POCT GLYCOSYLATED HEMOGLOBIN (HGB A1C): Hemoglobin A1C: 6.2 % — AB (ref 4.0–5.6)

## 2022-08-11 NOTE — Assessment & Plan Note (Signed)
BP well controlled. No medications side effects or concerns

## 2022-08-11 NOTE — Assessment & Plan Note (Addendum)
Blood sugars stable without hypoglycemic symptoms or events. Current regimen is metformin bid and Januvia 50 mg. Changes made last visit are none. Lab Results  Component Value Date   HGBA1C 6.6 (H) 06/30/2022  A1C today = 6.2 Continue current therapy

## 2022-08-11 NOTE — Progress Notes (Signed)
Date:  08/11/2022   Name:  Maxwell Stanley   DOB:  07-11-1934   MRN:  [id number]   Chief Complaint: Hypertension and Diabetes He is doing well - one month since knee replacement.  Getting around well with minimal pain.  Hypertension Pertinent negatives include no chest pain, headaches, palpitations or shortness of breath.  Diabetes He presents for his follow-up diabetic visit. He has type 2 diabetes mellitus. Pertinent negatives for hypoglycemia include no headaches or tremors. Pertinent negatives for diabetes include no chest pain, no fatigue, no polydipsia and no polyuria.    Lab Results  Component Value Date   NA 137 06/30/2022   K 4.4 06/30/2022   CO2 24 06/30/2022   GLUCOSE 153 (H) 06/30/2022   BUN 23 06/30/2022   CREATININE 1.28 (H) 06/30/2022   CALCIUM 9.4 06/30/2022   EGFR 53 (L) 04/09/2022   GFRNONAA 55 (L) 06/30/2022   Lab Results  Component Value Date   CHOL 138 04/09/2022   HDL 40 04/09/2022   LDLCALC 76 04/09/2022   TRIG 119 04/09/2022   CHOLHDL 3.5 04/09/2022   Lab Results  Component Value Date   TSH 2.500 02/19/2021   Lab Results  Component Value Date   HGBA1C 6.6 (H) 06/30/2022   Lab Results  Component Value Date   WBC 6.9 06/30/2022   HGB 13.3 06/30/2022   HCT 38.8 (L) 06/30/2022   MCV 97.0 06/30/2022   PLT 231 06/30/2022   Lab Results  Component Value Date   ALT 22 06/30/2022   AST 26 06/30/2022   ALKPHOS 75 06/30/2022   BILITOT 0.8 06/30/2022   No results found for: "25OHVITD2", "25OHVITD3", "VD25OH"   Review of Systems  Constitutional:  Negative for appetite change, fatigue and unexpected weight change.  Eyes:  Negative for visual disturbance.  Respiratory:  Negative for cough, shortness of breath and wheezing.   Cardiovascular:  Negative for chest pain, palpitations and leg swelling.  Gastrointestinal:  Negative for abdominal pain and blood in stool.  Endocrine: Negative for polydipsia and polyuria.  Genitourinary:  Negative  for dysuria and hematuria.  Skin:  Negative for color change and rash.  Neurological:  Negative for tremors, numbness and headaches.  Psychiatric/Behavioral:  Negative for dysphoric mood.     Patient Active Problem List   Diagnosis Date Noted   Status post total hip replacement, left 07/14/2022   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   Hx of total hip arthroplasty, right 10/21/2021   Bilateral carotid artery stenosis 05/22/2021   Anemia 02/28/2021   Total knee replacement status 04/16/2020   Primary osteoarthritis of both knees 05/19/2019   Gastroesophageal reflux disease with esophagitis 02/02/2019   Personal history of other malignant neoplasm of skin 12/17/2018   History of atrial fibrillation 10/29/2018   Reducible right inguinal hernia 09/16/2018   DDD (degenerative disc disease), lumbar 12/23/2017   Onychomycosis of toenail 08/19/2017   Allergic rhinitis 08/08/2016   Carpal tunnel syndrome on left 12/12/2015   Chronic maxillary sinusitis 04/09/2015   Type II diabetes mellitus with complication (HCC) 11/28/2014   Chronic low back pain 09/21/2014   Abnormal prostate specific antigen 04/17/2014   Enlarged prostate with lower urinary tract symptoms (LUTS) 04/17/2014   CKD stage 3 secondary to diabetes (HCC) 04/17/2014   Hyperlipidemia associated with type 2 diabetes mellitus (HCC) 04/17/2014   ED (erectile dysfunction) of organic origin 04/17/2014   Essential (primary) hypertension 04/17/2014   Back muscle spasm 04/17/2014   Degenerative arthritis  of finger 04/17/2014   Breast development in males 04/17/2014   Leg varices 04/17/2014    Allergies  Allergen Reactions   Codeine Other (See Comments)    Made his "Wild"   Ceftriaxone Rash   Penicillin G Rash    Did it involve swelling of the face/tongue/throat, SOB, or low BP? No Did it involve sudden or severe rash/hives, skin peeling, or any reaction on the inside of your mouth or nose? No Did you  need to seek medical attention at a hospital or doctor's office? No When did it last happen?       If all above answers are "NO", may proceed with cephalosporin use.  IgE = 35 (WNL) on 01/18/2020     Past Surgical History:  Procedure Laterality Date   ANKLE ARTHROSCOPY WITH OPEN REDUCTION INTERNAL FIXATION (ORIF) Left 1998   COLONOSCOPY     COLONOSCOPY WITH PROPOFOL N/A 05/28/2021   Procedure: COLONOSCOPY WITH PROPOFOL;  Surgeon: Midge Minium, MD;  Location: [facility] ENDOSCOPY;  Service: Endoscopy;  Laterality: N/A;   ESOPHAGOGASTRODUODENOSCOPY (EGD) WITH PROPOFOL N/A 05/28/2021   Procedure: ESOPHAGOGASTRODUODENOSCOPY (EGD) WITH PROPOFOL;  Surgeon: Midge Minium, MD;  Location: [facility] ENDOSCOPY;  Service: Endoscopy;  Laterality: N/A;   ETHMOIDECTOMY Left 03/08/2020   Procedure: TOTAL ETHMOIDECTOMY;  Surgeon: Vernie Murders, MD;  Location: [facility];  Service: ENT;  Laterality: Left;   FRONTAL SINUS EXPLORATION Left 03/08/2020   Procedure: FRONTAL SINUS EXPLORATION;  Surgeon: Vernie Murders, MD;  Location: [facility] SURGERY [facility];  Service: ENT;  Laterality: Left;   GIVENS CAPSULE STUDY N/A 07/11/2021   Procedure: GIVENS CAPSULE STUDY;  Surgeon: Midge Minium, MD;  Location: [facility] ENDOSCOPY;  Service: Endoscopy;  Laterality: N/A;   HEMORRHOID SURGERY     IMAGE GUIDED SINUS SURGERY N/A 03/08/2020   Procedure: IMAGE GUIDED SINUS SURGERY;  Surgeon: Vernie Murders, MD;  Location: [facility] SURGERY [facility]TR;  Service: ENT;  Laterality: N/A;  placed disk on OR charge nurse desk 2-21 kp PLACED 2ND DISK ON OR CHARGE NURSE DESK 02-28-20 KP 3RD DISK ON OR CHARGE NURSE DESK 2-25- KP   INGUINAL HERNIA REPAIR Right 11/12/2018   Procedure: HERNIA REPAIR INGUINAL ADULT;  Surgeon: Duanne Guess, MD;  Location: [facility] ORS;  Service: General;  Laterality: Right;   INGUINAL HERNIA REPAIR Left 2012   JOINT REPLACEMENT     KNEE ARTHROPLASTY Right 04/16/2020   Procedure: COMPUTER ASSISTED TOTAL KNEE ARTHROPLASTY;   Surgeon: Donato Heinz, MD;  Location: [facility] ORS;  Service: Orthopedics;  Laterality: Right;   KNEE ARTHROSCOPY Right 2006   MAXILLARY ANTROSTOMY Left 03/08/2020   Procedure: MAXILLARY ANTROSTOMY WITH TISSUE REMOVAL;  Surgeon: Vernie Murders, MD;  Location: [facility] SURGERY [facility];  Service: ENT;  Laterality: Left;   SEPTOPLASTY N/A 03/08/2020   Procedure: SEPTOPLASTY;  Surgeon: Vernie Murders, MD;  Location: [facility] SURGERY CN[facility];  Service: ENT;  Laterality: N/A;   skin cancer resection Right 04/07/2018   ON hand.   THUMB ARTHROSCOPY Right 2013   tendon flap to joint   TONSILLECTOMY     TOTAL HIP ARTHROPLASTY Right 10/21/2021   Procedure: TOTAL HIP ARTHROPLASTY;  Surgeon: Donato Heinz, MD;  Location: [facility] ORS;  Service: Orthopedics;  Laterality: Right;   TOTAL HIP ARTHROPLASTY Left 07/14/2022   Procedure: TOTAL HIP ARTHROPLASTY;  Surgeon: Donato Heinz, MD;  Location: [facility] ORS;  Service: Orthopedics;  Laterality: Left;   UMBILICAL HERNIA REPAIR Left 2012   UPPER GI ENDOSCOPY      Social History   Tobacco  Use   Smoking status: Never   Smokeless tobacco: Never  Vaping Use   Vaping status: Never Used  Substance Use Topics   Alcohol use: No    Alcohol/week: 0.0 standard drinks of alcohol   Drug use: No     Medication list has been reviewed and updated.  Current Meds  Medication Sig   acetaminophen (TYLENOL) 500 MG tablet Take 1,000 mg by mouth 2 (two) times daily.   amLODipine (NORVASC) 2.5 MG tablet TAKE 1 TABLET DAILY   aspirin EC 81 MG tablet Take 1 tablet (81 mg total) by mouth in the morning and at bedtime. Swallow whole.   atenolol (TENORMIN) 50 MG tablet TAKE 1 TABLET AT BEDTIME   celecoxib (CELEBREX) 200 MG capsule Take 1 capsule (200 mg total) by mouth 2 (two) times daily.   cholecalciferol (VITAMIN D) 1000 UNITS tablet Take 1,000 Units by mouth daily.   docusate sodium (COLACE) 100 MG capsule Take 200 mg by mouth daily.    ferrous sulfate 325 (65 FE) MG EC tablet Take 1  tablet by mouth daily.   finasteride (PROSCAR) 5 MG tablet TAKE 1 TABLET DAILY   fluticasone (FLONASE) 50 MCG/ACT nasal spray USE 2 SPRAYS IN EACH       NOSTRIL DAILY (Patient taking differently: Place 2 sprays into both nostrils daily as needed for allergies or rhinitis.)   gabapentin (NEURONTIN) 100 MG capsule Take 2 caps in the AM,  2 caps at midday and 4 caps at bedtime   glucose blood (ONETOUCH ULTRA) test strip Use as instructed   lidocaine (LMX) 4 % cream Apply 1 Application topically as needed.   losartan (COZAAR) 100 MG tablet TAKE 1/2 TABLET (50MG       TOTAL) TWO TIMES A DAY   Melatonin 10 MG CAPS Take 10 mg by mouth at bedtime.   metFORMIN (GLUCOPHAGE-XR) 500 MG 24 hr tablet TAKE 1 TABLET TWICE A DAY  WITH MEALS   Misc Natural Products (OSTEO BI-FLEX TRIPLE STRENGTH PO) Take 1 tablet by mouth daily.   Multiple Vitamin (MULTIVITAMIN WITH MINERALS) TABS tablet Take 1 tablet by mouth daily.   OneTouch Delica Lancets 33G MISC 1 each by Does not apply route daily. Use to test blood sugar once daily.   pantoprazole (PROTONIX) 40 MG tablet TAKE 1 TABLET TWICE A DAY   Probiotic Product (PROBIOTIC DAILY PO) Take 1 tablet by mouth daily.   rosuvastatin (CRESTOR) 20 MG tablet TAKE 1 TABLET AT BEDTIME   sitaGLIPtin (JANUVIA) 100 MG tablet TAKE 1 TABLET DAILY   tamsulosin (FLOMAX) 0.4 MG CAPS capsule TAKE 1 CAPSULE DAILY AFTER SUPPER   [DISCONTINUED] traMADol (ULTRAM) 50 MG tablet Take 1-2 tablets (50-100 mg total) by mouth every 4 (four) hours as needed for moderate pain.       08/11/2022    8:21 AM 05/20/2022    1:35 PM 04/29/2022    8:59 AM 04/09/2022    9:00 AM  GAD 7 : Generalized Anxiety Score  Nervous, Anxious, on Edge 0 0 0 0  Control/stop worrying 0 0 0 0  Worry too much - different things 0 0 0 0  Trouble relaxing 0 0 0 0  Restless 0 0 0 0  Easily annoyed or irritable 0 0 0 0  Afraid - awful might happen 0 0 0 0  Total GAD 7 Score 0 0 0 0  Anxiety Difficulty Not difficult at all  Not difficult at all Not difficult at all Not difficult at all  08/11/2022    8:21 AM 05/20/2022    1:34 PM 04/29/2022    8:59 AM  Depression screen PHQ 2/9  Decreased Interest 0 0 0  Down, Depressed, Hopeless 0 0 0  PHQ - 2 Score 0 0 0  Altered sleeping 0 0 2  Tired, decreased energy 0 0 0  Change in appetite 0 0 0  Feeling bad or failure about yourself  0 0 0  Trouble concentrating 0 0 0  Moving slowly or fidgety/restless 0 0 0  Suicidal thoughts 0 0 0  PHQ-9 Score 0 0 2  Difficult doing work/chores Not difficult at all Not difficult at all Not difficult at all    BP Readings from Last 3 Encounters:  08/11/22 128/70  07/15/22 139/69  06/30/22 126/81    Physical Exam Vitals and nursing note reviewed.  Constitutional:      General: He is not in acute distress.    Appearance: Normal appearance. He is well-developed.  HENT:     Head: Normocephalic and atraumatic.  Neck:     Vascular: No carotid bruit.  Cardiovascular:     Rate and Rhythm: Normal rate and regular rhythm.  Pulmonary:     Effort: Pulmonary effort is normal. No respiratory distress.     Breath sounds: No wheezing or rhonchi.  Musculoskeletal:     Cervical back: Normal range of motion.  Lymphadenopathy:     Cervical: No cervical adenopathy.  Skin:    General: Skin is warm and dry.     Findings: No rash.  Neurological:     Mental Status: He is alert and oriented to person, place, and time.  Psychiatric:        Mood and Affect: Mood normal.        Behavior: Behavior normal.     Wt Readings from Last 3 Encounters:  08/11/22 178 lb (80.7 kg)  07/14/22 175 lb (79.4 kg)  06/30/22 175 lb (79.4 kg)    BP 128/70   Pulse 76   Ht 6\' 4"  (1.93 m)   Wt 178 lb (80.7 kg)   SpO2 97%   BMI 21.67 kg/m   Assessment and Plan:  Problem List Items Addressed This Visit       Unprioritized   Type II diabetes mellitus with complication (HCC) (Chronic)    Blood sugars stable without hypoglycemic symptoms  or events. Current regimen is metformin bid and Januvia 50 mg. Changes made last visit are none. Lab Results  Component Value Date   HGBA1C 6.6 (H) 06/30/2022  A1C today = 6.2 Continue current therapy       Relevant Orders   POCT glycosylated hemoglobin (Hb A1C)   Essential (primary) hypertension - Primary (Chronic)    BP well controlled. No medications side effects or concerns      Other Visit Diagnoses     Long term current use of oral hypoglycemic drug           Return in about 4 months (around 12/11/2022) for DM, HTN.    Reubin Milan, MD [facility] [place]

## 2022-08-18 DIAGNOSIS — D509 Iron deficiency anemia, unspecified: Secondary | ICD-10-CM | POA: Diagnosis not present

## 2022-08-18 DIAGNOSIS — Z96643 Presence of artificial hip joint, bilateral: Secondary | ICD-10-CM | POA: Diagnosis not present

## 2022-08-18 DIAGNOSIS — Z791 Long term (current) use of non-steroidal anti-inflammatories (NSAID): Secondary | ICD-10-CM | POA: Diagnosis not present

## 2022-08-18 DIAGNOSIS — M5136 Other intervertebral disc degeneration, lumbar region: Secondary | ICD-10-CM | POA: Diagnosis not present

## 2022-08-18 DIAGNOSIS — I700 Atherosclerosis of aorta: Secondary | ICD-10-CM | POA: Diagnosis not present

## 2022-08-18 DIAGNOSIS — K219 Gastro-esophageal reflux disease without esophagitis: Secondary | ICD-10-CM | POA: Diagnosis not present

## 2022-08-18 DIAGNOSIS — R351 Nocturia: Secondary | ICD-10-CM | POA: Diagnosis not present

## 2022-08-18 DIAGNOSIS — E1122 Type 2 diabetes mellitus with diabetic chronic kidney disease: Secondary | ICD-10-CM | POA: Diagnosis not present

## 2022-08-18 DIAGNOSIS — F1010 Alcohol abuse, uncomplicated: Secondary | ICD-10-CM | POA: Diagnosis not present

## 2022-08-18 DIAGNOSIS — E785 Hyperlipidemia, unspecified: Secondary | ICD-10-CM | POA: Diagnosis not present

## 2022-08-18 DIAGNOSIS — E1136 Type 2 diabetes mellitus with diabetic cataract: Secondary | ICD-10-CM | POA: Diagnosis not present

## 2022-08-18 DIAGNOSIS — G5602 Carpal tunnel syndrome, left upper limb: Secondary | ICD-10-CM | POA: Diagnosis not present

## 2022-08-18 DIAGNOSIS — N401 Enlarged prostate with lower urinary tract symptoms: Secondary | ICD-10-CM | POA: Diagnosis not present

## 2022-08-18 DIAGNOSIS — M48061 Spinal stenosis, lumbar region without neurogenic claudication: Secondary | ICD-10-CM | POA: Diagnosis not present

## 2022-08-18 DIAGNOSIS — I10 Essential (primary) hypertension: Secondary | ICD-10-CM | POA: Diagnosis not present

## 2022-08-18 DIAGNOSIS — K5900 Constipation, unspecified: Secondary | ICD-10-CM | POA: Diagnosis not present

## 2022-08-18 DIAGNOSIS — Z7984 Long term (current) use of oral hypoglycemic drugs: Secondary | ICD-10-CM | POA: Diagnosis not present

## 2022-08-18 DIAGNOSIS — N183 Chronic kidney disease, stage 3 unspecified: Secondary | ICD-10-CM | POA: Diagnosis not present

## 2022-08-18 DIAGNOSIS — D631 Anemia in chronic kidney disease: Secondary | ICD-10-CM | POA: Diagnosis not present

## 2022-08-18 DIAGNOSIS — G8929 Other chronic pain: Secondary | ICD-10-CM | POA: Diagnosis not present

## 2022-08-18 DIAGNOSIS — I6523 Occlusion and stenosis of bilateral carotid arteries: Secondary | ICD-10-CM | POA: Diagnosis not present

## 2022-08-18 DIAGNOSIS — I4819 Other persistent atrial fibrillation: Secondary | ICD-10-CM | POA: Diagnosis not present

## 2022-08-18 DIAGNOSIS — Z7982 Long term (current) use of aspirin: Secondary | ICD-10-CM | POA: Diagnosis not present

## 2022-08-18 DIAGNOSIS — Z471 Aftercare following joint replacement surgery: Secondary | ICD-10-CM | POA: Diagnosis not present

## 2022-08-19 ENCOUNTER — Other Ambulatory Visit: Payer: Self-pay | Admitting: Internal Medicine

## 2022-08-19 ENCOUNTER — Ambulatory Visit: Payer: Self-pay | Admitting: *Deleted

## 2022-08-19 DIAGNOSIS — T753XXA Motion sickness, initial encounter: Secondary | ICD-10-CM

## 2022-08-19 DIAGNOSIS — Z471 Aftercare following joint replacement surgery: Secondary | ICD-10-CM | POA: Diagnosis not present

## 2022-08-19 MED ORDER — SCOPOLAMINE 1 MG/3DAYS TD PT72
1.0000 | MEDICATED_PATCH | TRANSDERMAL | 0 refills | Status: AC
Start: 2022-08-19 — End: ?

## 2022-08-19 NOTE — Telephone Encounter (Signed)
Patient informed.  Maxwell Stanley 

## 2022-08-19 NOTE — Telephone Encounter (Signed)
Summary: needs medication for motion sickness   Patient called and stated he is going deep sea fishing in a couple of weeks and needs medication, transderm-scop, called in for motion sickness. Please advise.    [id number] [place], [place] - [address] Phone: [phone] Fax: [phone]     Patients callback # [phone]        Chief Complaint: NA Symptoms: NA Frequency: NA Pertinent Negatives: Patient denies NA Disposition: [] ED /[] Urgent Care (no appt availability in office) / [] Appointment(In office/virtual)/ []  [place] [facility]/ [] Home Care/ [] Refused Recommended Disposition /[]  Mobile Bus/ [x]  Follow-up with PCP Additional Notes:  Pt states he has used many times in past. States dramamine,  other OTC meds "Do nothing for me." Please advise.  Reason for Disposition  [1] Caller has NON-URGENT medicine question about med that PCP prescribed AND [2] triager unable to answer question  Answer Assessment - Initial Assessment Questions 1. NAME of MEDICINE: "What medicine(s) are you calling about?"     Scopolamine patch 2. QUESTION: "What is your question?" (e.g., double dose of medicine, side effect)     Order for trip in 2 weeks  Protocols used: Medication Question Call-A-AH

## 2022-08-25 DIAGNOSIS — Z471 Aftercare following joint replacement surgery: Secondary | ICD-10-CM | POA: Diagnosis not present

## 2022-08-25 DIAGNOSIS — G5602 Carpal tunnel syndrome, left upper limb: Secondary | ICD-10-CM | POA: Diagnosis not present

## 2022-08-25 DIAGNOSIS — M48061 Spinal stenosis, lumbar region without neurogenic claudication: Secondary | ICD-10-CM | POA: Diagnosis not present

## 2022-08-25 DIAGNOSIS — N401 Enlarged prostate with lower urinary tract symptoms: Secondary | ICD-10-CM | POA: Diagnosis not present

## 2022-08-25 DIAGNOSIS — K219 Gastro-esophageal reflux disease without esophagitis: Secondary | ICD-10-CM | POA: Diagnosis not present

## 2022-08-25 DIAGNOSIS — E1136 Type 2 diabetes mellitus with diabetic cataract: Secondary | ICD-10-CM | POA: Diagnosis not present

## 2022-08-25 DIAGNOSIS — Z7984 Long term (current) use of oral hypoglycemic drugs: Secondary | ICD-10-CM | POA: Diagnosis not present

## 2022-08-25 DIAGNOSIS — D631 Anemia in chronic kidney disease: Secondary | ICD-10-CM | POA: Diagnosis not present

## 2022-08-25 DIAGNOSIS — Z7982 Long term (current) use of aspirin: Secondary | ICD-10-CM | POA: Diagnosis not present

## 2022-08-25 DIAGNOSIS — K5900 Constipation, unspecified: Secondary | ICD-10-CM | POA: Diagnosis not present

## 2022-08-25 DIAGNOSIS — M5136 Other intervertebral disc degeneration, lumbar region: Secondary | ICD-10-CM | POA: Diagnosis not present

## 2022-08-25 DIAGNOSIS — G8929 Other chronic pain: Secondary | ICD-10-CM | POA: Diagnosis not present

## 2022-08-25 DIAGNOSIS — R351 Nocturia: Secondary | ICD-10-CM | POA: Diagnosis not present

## 2022-08-25 DIAGNOSIS — D509 Iron deficiency anemia, unspecified: Secondary | ICD-10-CM | POA: Diagnosis not present

## 2022-08-25 DIAGNOSIS — N183 Chronic kidney disease, stage 3 unspecified: Secondary | ICD-10-CM | POA: Diagnosis not present

## 2022-08-25 DIAGNOSIS — Z96643 Presence of artificial hip joint, bilateral: Secondary | ICD-10-CM | POA: Diagnosis not present

## 2022-08-25 DIAGNOSIS — E1122 Type 2 diabetes mellitus with diabetic chronic kidney disease: Secondary | ICD-10-CM | POA: Diagnosis not present

## 2022-08-25 DIAGNOSIS — F1010 Alcohol abuse, uncomplicated: Secondary | ICD-10-CM | POA: Diagnosis not present

## 2022-08-25 DIAGNOSIS — E785 Hyperlipidemia, unspecified: Secondary | ICD-10-CM | POA: Diagnosis not present

## 2022-08-25 DIAGNOSIS — I700 Atherosclerosis of aorta: Secondary | ICD-10-CM | POA: Diagnosis not present

## 2022-08-25 DIAGNOSIS — I4819 Other persistent atrial fibrillation: Secondary | ICD-10-CM | POA: Diagnosis not present

## 2022-08-25 DIAGNOSIS — Z791 Long term (current) use of non-steroidal anti-inflammatories (NSAID): Secondary | ICD-10-CM | POA: Diagnosis not present

## 2022-08-25 DIAGNOSIS — I6523 Occlusion and stenosis of bilateral carotid arteries: Secondary | ICD-10-CM | POA: Diagnosis not present

## 2022-08-25 DIAGNOSIS — I10 Essential (primary) hypertension: Secondary | ICD-10-CM | POA: Diagnosis not present

## 2022-08-26 DIAGNOSIS — Z96642 Presence of left artificial hip joint: Secondary | ICD-10-CM | POA: Diagnosis not present

## 2022-08-26 DIAGNOSIS — M1712 Unilateral primary osteoarthritis, left knee: Secondary | ICD-10-CM | POA: Diagnosis not present

## 2022-09-08 ENCOUNTER — Other Ambulatory Visit: Payer: Self-pay | Admitting: Internal Medicine

## 2022-09-08 DIAGNOSIS — I10 Essential (primary) hypertension: Secondary | ICD-10-CM

## 2022-09-08 DIAGNOSIS — E785 Hyperlipidemia, unspecified: Secondary | ICD-10-CM

## 2022-09-08 DIAGNOSIS — E118 Type 2 diabetes mellitus with unspecified complications: Secondary | ICD-10-CM

## 2022-09-09 DIAGNOSIS — C4442 Squamous cell carcinoma of skin of scalp and neck: Secondary | ICD-10-CM | POA: Diagnosis not present

## 2022-09-09 DIAGNOSIS — D485 Neoplasm of uncertain behavior of skin: Secondary | ICD-10-CM | POA: Diagnosis not present

## 2022-09-09 DIAGNOSIS — C44222 Squamous cell carcinoma of skin of right ear and external auricular canal: Secondary | ICD-10-CM | POA: Diagnosis not present

## 2022-10-06 DIAGNOSIS — M1712 Unilateral primary osteoarthritis, left knee: Secondary | ICD-10-CM | POA: Diagnosis not present

## 2022-10-13 DIAGNOSIS — M1712 Unilateral primary osteoarthritis, left knee: Secondary | ICD-10-CM | POA: Diagnosis not present

## 2022-10-14 DIAGNOSIS — C4491 Basal cell carcinoma of skin, unspecified: Secondary | ICD-10-CM | POA: Diagnosis not present

## 2022-10-14 DIAGNOSIS — D485 Neoplasm of uncertain behavior of skin: Secondary | ICD-10-CM | POA: Diagnosis not present

## 2022-10-14 DIAGNOSIS — C4442 Squamous cell carcinoma of skin of scalp and neck: Secondary | ICD-10-CM | POA: Diagnosis not present

## 2022-10-14 DIAGNOSIS — C4449 Other specified malignant neoplasm of skin of scalp and neck: Secondary | ICD-10-CM | POA: Diagnosis not present

## 2022-10-14 DIAGNOSIS — C44629 Squamous cell carcinoma of skin of left upper limb, including shoulder: Secondary | ICD-10-CM | POA: Diagnosis not present

## 2022-10-16 ENCOUNTER — Encounter: Payer: Self-pay | Admitting: Radiation Oncology

## 2022-10-16 ENCOUNTER — Ambulatory Visit
Admission: RE | Admit: 2022-10-16 | Discharge: 2022-10-16 | Disposition: A | Discharge status: Home / Self Care | Payer: Medicare HMO | Source: Ambulatory Visit | Attending: Radiation Oncology | Admitting: Radiation Oncology

## 2022-10-16 VITALS — BP 146/89 | HR 68 | Temp 97.3°F | Wt 174.8 lb

## 2022-10-16 DIAGNOSIS — C44202 Unspecified malignant neoplasm of skin of right ear and external auricular canal: Secondary | ICD-10-CM | POA: Diagnosis not present

## 2022-10-16 DIAGNOSIS — Z923 Personal history of irradiation: Secondary | ICD-10-CM | POA: Diagnosis not present

## 2022-10-16 NOTE — Progress Notes (Signed)
Radiation Oncology Follow up Note  Name: Maxwell Stanley   Date:   10/16/2022 MRN:  [id number] DOB: 03/31/1934    This 88 y.o. 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  Seen today in routine follow-up he is doing well he is without complaint.  He has had a few mother lesions recently treated in his left neck by dermatology..  COMPLICATIONS OF TREATMENT: none  FOLLOW UP COMPLIANCE: keeps appointments   PHYSICAL EXAM:  BP (!) 146/89   Pulse 68   Temp (!) 97.3 F (36.3 C) (Tympanic)   Wt 174 lb 12.8 oz (79.3 kg)   BMI 21.28 kg/m  No evidence of residual disease or progressive disease in the right ear is noted.  No submandibular cervical or supraclavicular adenopathy is identified.  Lungs are clear to AMP.  RADIOLOGY RESULTS: No current films for review  PLAN: Present time patient is doing well with no evidence of disease.  I am turning follow-up care over to his dermatologist.  Be happy to reevaluate him at any time for any further areas that need my attention.  Patient knows to call with any concerns.  I would like to take this opportunity to thank you for allowing me to participate in the care of your patient.Carmina Miller, MD

## 2022-10-20 DIAGNOSIS — M1712 Unilateral primary osteoarthritis, left knee: Secondary | ICD-10-CM | POA: Diagnosis not present

## 2022-11-04 DIAGNOSIS — C44319 Basal cell carcinoma of skin of other parts of face: Secondary | ICD-10-CM | POA: Diagnosis not present

## 2022-11-10 DIAGNOSIS — C44622 Squamous cell carcinoma of skin of right upper limb, including shoulder: Secondary | ICD-10-CM | POA: Diagnosis not present

## 2022-11-10 DIAGNOSIS — C44629 Squamous cell carcinoma of skin of left upper limb, including shoulder: Secondary | ICD-10-CM | POA: Diagnosis not present

## 2022-11-20 DIAGNOSIS — E1122 Type 2 diabetes mellitus with diabetic chronic kidney disease: Secondary | ICD-10-CM | POA: Diagnosis not present

## 2022-11-20 DIAGNOSIS — E119 Type 2 diabetes mellitus without complications: Secondary | ICD-10-CM | POA: Diagnosis not present

## 2022-11-20 DIAGNOSIS — N183 Chronic kidney disease, stage 3 unspecified: Secondary | ICD-10-CM | POA: Diagnosis not present

## 2022-11-20 DIAGNOSIS — M7671 Peroneal tendinitis, right leg: Secondary | ICD-10-CM | POA: Diagnosis not present

## 2022-12-05 ENCOUNTER — Other Ambulatory Visit: Payer: Self-pay | Admitting: Internal Medicine

## 2022-12-05 DIAGNOSIS — K2100 Gastro-esophageal reflux disease with esophagitis, without bleeding: Secondary | ICD-10-CM

## 2022-12-09 NOTE — Telephone Encounter (Signed)
Requested Prescriptions  Pending Prescriptions Disp Refills   pantoprazole (PROTONIX) 40 MG tablet [Pharmacy Med Name: PANTOPRAZOLE TAB 40MG  DR] 180 tablet 1    Sig: TAKE 1 TABLET TWICE A DAY     Gastroenterology: Proton Pump Inhibitors Passed - 12/05/2022  5:11 PM      Passed - Valid encounter within last 12 months    Recent Outpatient Visits           4 months ago Essential (primary) hypertension   [place] [facility] at [facility], Nyoka Cowden, MD   6 months ago PHN (postherpetic neuralgia)   [place] [facility] at [facility], Nyoka Cowden, MD   6 months ago PHN (postherpetic neuralgia)   [place] [facility] at [facility], Nyoka Cowden, MD   7 months ago Herpes zoster without complication   [facility] at [facility], Nyoka Cowden, MD   8 months ago Annual physical exam   [facility] at [facility], Nyoka Cowden, MD       Future Appointments             In 2 weeks Judithann Graves, Nyoka Cowden, MD [facility] at [facility], [facility]

## 2022-12-17 ENCOUNTER — Ambulatory Visit: Payer: Medicare HMO | Admitting: Internal Medicine

## 2022-12-17 DIAGNOSIS — I480 Paroxysmal atrial fibrillation: Secondary | ICD-10-CM | POA: Diagnosis not present

## 2022-12-17 DIAGNOSIS — N183 Chronic kidney disease, stage 3 unspecified: Secondary | ICD-10-CM | POA: Diagnosis not present

## 2022-12-17 DIAGNOSIS — I10 Essential (primary) hypertension: Secondary | ICD-10-CM | POA: Diagnosis not present

## 2022-12-17 DIAGNOSIS — Z8679 Personal history of other diseases of the circulatory system: Secondary | ICD-10-CM | POA: Diagnosis not present

## 2022-12-17 DIAGNOSIS — E782 Mixed hyperlipidemia: Secondary | ICD-10-CM | POA: Diagnosis not present

## 2022-12-17 DIAGNOSIS — E1122 Type 2 diabetes mellitus with diabetic chronic kidney disease: Secondary | ICD-10-CM | POA: Diagnosis not present

## 2022-12-23 ENCOUNTER — Ambulatory Visit: Payer: Medicare HMO | Admitting: Internal Medicine

## 2023-01-06 ENCOUNTER — Ambulatory Visit: Payer: Medicare HMO | Admitting: Internal Medicine

## 2023-01-06 ENCOUNTER — Ambulatory Visit (INDEPENDENT_AMBULATORY_CARE_PROVIDER_SITE_OTHER): Payer: Medicare HMO | Admitting: Internal Medicine

## 2023-01-06 ENCOUNTER — Encounter: Payer: Self-pay | Admitting: Internal Medicine

## 2023-01-06 VITALS — BP 116/74 | HR 61 | Ht 76.0 in | Wt 178.0 lb

## 2023-01-06 DIAGNOSIS — I10 Essential (primary) hypertension: Secondary | ICD-10-CM | POA: Diagnosis not present

## 2023-01-06 DIAGNOSIS — Z7984 Long term (current) use of oral hypoglycemic drugs: Secondary | ICD-10-CM

## 2023-01-06 DIAGNOSIS — E118 Type 2 diabetes mellitus with unspecified complications: Secondary | ICD-10-CM | POA: Diagnosis not present

## 2023-01-06 NOTE — Assessment & Plan Note (Signed)
 Controlled BP with normal exam. Current regimen is amlodipine, atenolol and losartan. Will continue same medications; encourage continued reduced sodium diet.

## 2023-01-06 NOTE — Assessment & Plan Note (Signed)
 Blood sugars stable without hypoglycemic symptoms or events. Currently managed with metformin and Januvia. Changes made last visit are none. Lab Results  Component Value Date   HGBA1C 6.2 (A) 08/11/2022

## 2023-01-06 NOTE — Progress Notes (Signed)
 Date:  01/06/2023   Name:  Maxwell Stanley   DOB:  1934/08/22   MRN:  [id number]   Chief Complaint: Hypertension and Diabetes  Hypertension This is a chronic problem. The problem is controlled. Pertinent negatives include no chest pain, headaches, palpitations or shortness of breath. Past treatments include angiotensin blockers, calcium  channel blockers and beta blockers. There are no compliance problems.  Hypertensive end-organ damage includes kidney disease. There is no history of CAD/MI or CVA.  Diabetes He presents for his follow-up diabetic visit. He has type 2 diabetes mellitus. Pertinent negatives for hypoglycemia include no headaches or tremors. Pertinent negatives for diabetes include no chest pain, no fatigue, no polydipsia and no polyuria. Pertinent negatives for diabetic complications include no CVA. An ACE inhibitor/angiotensin II receptor blocker is being taken.    Review of Systems  Constitutional:  Negative for appetite change, fatigue and unexpected weight change.  Eyes:  Negative for visual disturbance.  Respiratory:  Negative for cough, shortness of breath and wheezing.   Cardiovascular:  Negative for chest pain, palpitations and leg swelling.  Gastrointestinal:  Negative for abdominal pain and blood in stool.  Endocrine: Negative for polydipsia and polyuria.  Genitourinary:  Negative for dysuria and hematuria.  Skin:  Negative for color change and rash.  Neurological:  Negative for tremors, numbness and headaches.  Psychiatric/Behavioral:  Negative for dysphoric mood.      Lab Results  Component Value Date   NA 137 06/30/2022   K 4.4 06/30/2022   CO2 24 06/30/2022   GLUCOSE 153 (H) 06/30/2022   BUN 23 06/30/2022   CREATININE 1.28 (H) 06/30/2022   CALCIUM  9.4 06/30/2022   EGFR 53 (L) 04/09/2022   GFRNONAA 55 (L) 06/30/2022   Lab Results  Component Value Date   CHOL 138 04/09/2022   HDL 40 04/09/2022   LDLCALC 76 04/09/2022   TRIG 119 04/09/2022    CHOLHDL 3.5 04/09/2022   Lab Results  Component Value Date   TSH 2.500 02/19/2021   Lab Results  Component Value Date   HGBA1C 6.2 (A) 08/11/2022   Lab Results  Component Value Date   WBC 6.9 06/30/2022   HGB 13.3 06/30/2022   HCT 38.8 (L) 06/30/2022   MCV 97.0 06/30/2022   PLT 231 06/30/2022   Lab Results  Component Value Date   ALT 22 06/30/2022   AST 26 06/30/2022   ALKPHOS 75 06/30/2022   BILITOT 0.8 06/30/2022   No results found for: MARIEN BOLLS, VD25OH   Patient Active Problem List   Diagnosis Date Noted   Status post total hip replacement, left 07/14/2022   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   Hx of total hip arthroplasty, right 10/21/2021   Bilateral carotid artery stenosis 05/22/2021   Anemia 02/28/2021   Total knee replacement status 04/16/2020   Primary osteoarthritis of both knees 05/19/2019   Gastroesophageal reflux disease with esophagitis 02/02/2019   Personal history of other malignant neoplasm of skin 12/17/2018   History of atrial fibrillation 10/29/2018   Reducible right inguinal hernia 09/16/2018   DDD (degenerative disc disease), lumbar 12/23/2017   Onychomycosis of toenail 08/19/2017   Allergic rhinitis 08/08/2016   Carpal tunnel syndrome on left 12/12/2015   Chronic maxillary sinusitis 04/09/2015   Type II diabetes mellitus with complication (HCC) 11/28/2014   Chronic low back pain 09/21/2014   Abnormal prostate specific antigen 04/17/2014   Enlarged prostate with lower urinary tract symptoms (LUTS) 04/17/2014   CKD stage 3  secondary to diabetes (HCC) 04/17/2014   Hyperlipidemia associated with type 2 diabetes mellitus (HCC) 04/17/2014   ED (erectile dysfunction) of organic origin 04/17/2014   Essential (primary) hypertension 04/17/2014   Back muscle spasm 04/17/2014   Degenerative arthritis of finger 04/17/2014   Breast development in males 04/17/2014   Leg varices 04/17/2014    Allergies   Allergen Reactions   Codeine Other (See Comments)    Made his Wild   Ceftriaxone Rash   Penicillin G Rash    Did it involve swelling of the face/tongue/throat, SOB, or low BP? No Did it involve sudden or severe rash/hives, skin peeling, or any reaction on the inside of your mouth or nose? No Did you need to seek medical attention at a hospital or doctor's office? No When did it last happen?       If all above answers are NO, may proceed with cephalosporin use.  IgE = 35 (WNL) on 01/18/2020     Past Surgical History:  Procedure Laterality Date   ANKLE ARTHROSCOPY WITH OPEN REDUCTION INTERNAL FIXATION (ORIF) Left 1998   COLONOSCOPY     COLONOSCOPY WITH PROPOFOL  N/A 05/28/2021   Procedure: COLONOSCOPY WITH PROPOFOL ;  Surgeon: Jinny Carmine, MD;  Location: [facility];  Service: Endoscopy;  Laterality: N/A;   ESOPHAGOGASTRODUODENOSCOPY (EGD) WITH PROPOFOL  N/A 05/28/2021   Procedure: ESOPHAGOGASTRODUODENOSCOPY (EGD) WITH PROPOFOL ;  Surgeon: Jinny Carmine, MD;  Location: [facility];  Service: Endoscopy;  Laterality: N/A;   ETHMOIDECTOMY Left 03/08/2020   Procedure: TOTAL ETHMOIDECTOMY;  Surgeon: Edda Mt, MD;  Location: [facility];  Service: ENT;  Laterality: Left;   FRONTAL SINUS EXPLORATION Left 03/08/2020   Procedure: FRONTAL SINUS EXPLORATION;  Surgeon: Edda Mt, MD;  Location: [facility];  Service: ENT;  Laterality: Left;   GIVENS CAPSULE STUDY N/A 07/11/2021   Procedure: GIVENS CAPSULE STUDY;  Surgeon: Jinny Carmine, MD;  Location: [facility];  Service: Endoscopy;  Laterality: N/A;   HEMORRHOID SURGERY     IMAGE GUIDED SINUS SURGERY N/A 03/08/2020   Procedure: IMAGE GUIDED SINUS SURGERY;  Surgeon: Edda Mt, MD;  Location: [facility];  Service: ENT;  Laterality: N/A;  placed disk on OR charge nurse desk 2-21 kp PLACED 2ND DISK ON OR CHARGE NURSE DESK 02-28-20 KP 3RD DISK ON OR CHARGE NURSE DESK 2-25- KP   INGUINAL HERNIA REPAIR  Right 11/12/2018   Procedure: HERNIA REPAIR INGUINAL ADULT;  Surgeon: Marolyn Nest, MD;  Location: [facility];  Service: General;  Laterality: Right;   INGUINAL HERNIA REPAIR Left 2012   JOINT REPLACEMENT     KNEE ARTHROPLASTY Right 04/16/2020   Procedure: COMPUTER ASSISTED TOTAL KNEE ARTHROPLASTY;  Surgeon: Mardee Lynwood SQUIBB, MD;  Location: [facility];  Service: Orthopedics;  Laterality: Right;   KNEE ARTHROSCOPY Right 2006   MAXILLARY ANTROSTOMY Left 03/08/2020   Procedure: MAXILLARY ANTROSTOMY WITH TISSUE REMOVAL;  Surgeon: Edda Mt, MD;  Location: [facility];  Service: ENT;  Laterality: Left;   SEPTOPLASTY N/A 03/08/2020   Procedure: SEPTOPLASTY;  Surgeon: Edda Mt, MD;  Location: [facility];  Service: ENT;  Laterality: N/A;   skin cancer resection Right 04/07/2018   ON hand.   THUMB ARTHROSCOPY Right 2013   tendon flap to joint   TONSILLECTOMY     TOTAL HIP ARTHROPLASTY Right 10/21/2021   Procedure: TOTAL HIP ARTHROPLASTY;  Surgeon: Mardee Lynwood SQUIBB, MD;  Location: [facility];  Service: Orthopedics;  Laterality: Right;   TOTAL HIP ARTHROPLASTY Left 07/14/2022   Procedure:  TOTAL HIP ARTHROPLASTY;  Surgeon: Mardee Lynwood SQUIBB, MD;  Location: [facility];  Service: Orthopedics;  Laterality: Left;   UMBILICAL HERNIA REPAIR Left 2012   UPPER GI ENDOSCOPY      Social History   Tobacco Use   Smoking status: Never   Smokeless tobacco: Never  Vaping Use   Vaping status: Never Used  Substance Use Topics   Alcohol use: No    Alcohol/week: 0.0 standard drinks of alcohol   Drug use: No     Medication list has been reviewed and updated.  Current Meds  Medication Sig   acetaminophen  (TYLENOL ) 500 MG tablet Take 1,000 mg by mouth 2 (two) times daily.   amLODipine  (NORVASC ) 2.5 MG tablet TAKE 1 TABLET DAILY   aspirin  EC 81 MG tablet Take 1 tablet (81 mg total) by mouth in the morning and at bedtime. Swallow whole.   atenolol  (TENORMIN ) 50 MG tablet TAKE 1 TABLET AT  BEDTIME   celecoxib  (CELEBREX ) 200 MG capsule Take 1 capsule (200 mg total) by mouth 2 (two) times daily.   cholecalciferol  (VITAMIN D ) 1000 UNITS tablet Take 1,000 Units by mouth daily.   docusate sodium  (COLACE) 100 MG capsule Take 200 mg by mouth daily.    ferrous sulfate  325 (65 FE) MG EC tablet Take 1 tablet by mouth daily.   finasteride  (PROSCAR ) 5 MG tablet TAKE 1 TABLET DAILY   fluticasone  (FLONASE ) 50 MCG/ACT nasal spray USE 2 SPRAYS IN EACH       NOSTRIL DAILY (Patient taking differently: Place 2 sprays into both nostrils daily as needed for allergies or rhinitis.)   glucose blood (ONETOUCH ULTRA) test strip Use as instructed   lidocaine  (LMX) 4 % cream Apply 1 Application topically as needed.   losartan  (COZAAR ) 100 MG tablet TAKE 1/2 TABLET (50MG       TOTAL) TWO TIMES A DAY   Melatonin 10 MG CAPS Take 10 mg by mouth at bedtime.   metFORMIN  (GLUCOPHAGE -XR) 500 MG 24 hr tablet TAKE 1 TABLET TWICE DAILY  WITH MEALS   Misc Natural Products (OSTEO BI-FLEX TRIPLE STRENGTH PO) Take 1 tablet by mouth daily.   Multiple Vitamin (MULTIVITAMIN WITH MINERALS) TABS tablet Take 1 tablet by mouth daily.   OneTouch Delica Lancets 33G MISC 1 each by Does not apply route daily. Use to test blood sugar once daily.   pantoprazole  (PROTONIX ) 40 MG tablet TAKE 1 TABLET TWICE A DAY   Probiotic Product (PROBIOTIC DAILY PO) Take 1 tablet by mouth daily.   rosuvastatin  (CRESTOR ) 20 MG tablet TAKE 1 TABLET AT BEDTIME   scopolamine  (TRANSDERM SCOP , 1.5 MG,) 1 MG/3DAYS Place 1 patch (1.5 mg total) onto the skin every 3 (three) days.   sitaGLIPtin  (JANUVIA ) 100 MG tablet TAKE 1 TABLET DAILY   tamsulosin  (FLOMAX ) 0.4 MG CAPS capsule TAKE 1 CAPSULE DAILY AFTER SUPPER   [DISCONTINUED] gabapentin  (NEURONTIN ) 100 MG capsule Take 2 caps in the AM,  2 caps at midday and 4 caps at bedtime       08/11/2022    8:21 AM 05/20/2022    1:35 PM 04/29/2022    8:59 AM 04/09/2022    9:00 AM  GAD 7 : Generalized Anxiety Score   Nervous, Anxious, on Edge 0 0 0 0  Control/stop worrying 0 0 0 0  Worry too much - different things 0 0 0 0  Trouble relaxing 0 0 0 0  Restless 0 0 0 0  Easily annoyed or irritable 0 0 0 0  Afraid - awful might happen 0 0 0 0  Total GAD 7 Score 0 0 0 0  Anxiety Difficulty Not difficult at all Not difficult at all Not difficult at all Not difficult at all       08/11/2022    8:21 AM 05/20/2022    1:34 PM 04/29/2022    8:59 AM  Depression screen PHQ 2/9  Decreased Interest 0 0 0  Down, Depressed, Hopeless 0 0 0  PHQ - 2 Score 0 0 0  Altered sleeping 0 0 2  Tired, decreased energy 0 0 0  Change in appetite 0 0 0  Feeling bad or failure about yourself  0 0 0  Trouble concentrating 0 0 0  Moving slowly or fidgety/restless 0 0 0  Suicidal thoughts 0 0 0  PHQ-9 Score 0 0 2  Difficult doing work/chores Not difficult at all Not difficult at all Not difficult at all    BP Readings from Last 3 Encounters:  01/06/23 116/74  10/16/22 (!) 146/89  08/11/22 128/70    Physical Exam Vitals and nursing note reviewed.  Constitutional:      General: He is not in acute distress.    Appearance: He is well-developed.  HENT:     Head: Normocephalic and atraumatic.  Neck:     Vascular: No carotid bruit.  Cardiovascular:     Rate and Rhythm: Normal rate and regular rhythm.  Pulmonary:     Effort: Pulmonary effort is normal. No respiratory distress.     Breath sounds: No wheezing or rhonchi.  Musculoskeletal:     Cervical back: Normal range of motion.     Right lower leg: No edema.     Left lower leg: No edema.  Lymphadenopathy:     Cervical: No cervical adenopathy.  Skin:    General: Skin is warm and dry.     Findings: No rash.  Neurological:     General: No focal deficit present.     Mental Status: He is alert and oriented to person, place, and time.  Psychiatric:        Mood and Affect: Mood normal.        Behavior: Behavior normal.       01/06/2023    8:59 AM 01/10/2022    10:07 AM 11/23/2019    3:00 PM 11/22/2018    3:08 PM 09/09/2017    2:17 PM  6CIT Screen  What Year? 0 points 0 points 0 points 0 points 0 points  What month? 0 points 0 points 0 points 0 points 0 points  What time? 0 points 0 points 0 points 0 points 0 points  Count back from 20 0 points 0 points 0 points 0 points 0 points  Months in reverse 0 points 0 points 0 points 0 points 0 points  Repeat phrase 2 points 0 points 0 points 0 points 0 points  Total Score 2 points 0 points 0 points 0 points 0 points      Wt Readings from Last 3 Encounters:  01/06/23 178 lb (80.7 kg)  10/16/22 174 lb 12.8 oz (79.3 kg)  08/11/22 178 lb (80.7 kg)    BP 116/74   Pulse 61   Ht 6' 4 (1.93 m)   Wt 178 lb (80.7 kg)   SpO2 94%   BMI 21.67 kg/m   Assessment and Plan:  Problem List Items Addressed This Visit       Unprioritized   Essential (primary) hypertension (Chronic)  Controlled BP with normal exam. Current regimen is amlodipine , atenolol  and losartan . Will continue same medications; encourage continued reduced sodium diet.       Type II diabetes mellitus with complication (HCC) - Primary (Chronic)   Blood sugars stable without hypoglycemic symptoms or events. Currently managed with metformin  and Januvia . Changes made last visit are none. Lab Results  Component Value Date   HGBA1C 6.2 (A) 08/11/2022         Relevant Orders   Hemoglobin A1c   Basic metabolic panel   Other Visit Diagnoses       Long term current use of oral hypoglycemic drug           Return in about 4 months (around 05/06/2023) for CPX.    Leita HILARIO Adie, MD [facility] [place]

## 2023-01-07 LAB — BASIC METABOLIC PANEL
BUN/Creatinine Ratio: 15 (ref 10–24)
BUN: 19 mg/dL (ref 8–27)
CO2: 22 mmol/L (ref 20–29)
Calcium: 9.6 mg/dL (ref 8.6–10.2)
Chloride: 105 mmol/L (ref 96–106)
Creatinine, Ser: 1.27 mg/dL (ref 0.76–1.27)
Glucose: 147 mg/dL — ABNORMAL HIGH (ref 70–99)
Potassium: 4.4 mmol/L (ref 3.5–5.2)
Sodium: 142 mmol/L (ref 134–144)
eGFR: 55 mL/min/{1.73_m2} — ABNORMAL LOW (ref >59)

## 2023-01-07 LAB — HEMOGLOBIN A1C
Est. average glucose Bld gHb Est-mCnc: 143 mg/dL
Hgb A1c MFr Bld: 6.6 % — ABNORMAL HIGH (ref 4.8–5.6)

## 2023-01-12 DIAGNOSIS — H5213 Myopia, bilateral: Secondary | ICD-10-CM | POA: Diagnosis not present

## 2023-01-14 DIAGNOSIS — L570 Actinic keratosis: Secondary | ICD-10-CM | POA: Diagnosis not present

## 2023-01-14 DIAGNOSIS — C44519 Basal cell carcinoma of skin of other part of trunk: Secondary | ICD-10-CM | POA: Diagnosis not present

## 2023-01-14 DIAGNOSIS — C44619 Basal cell carcinoma of skin of left upper limb, including shoulder: Secondary | ICD-10-CM | POA: Diagnosis not present

## 2023-01-14 DIAGNOSIS — D485 Neoplasm of uncertain behavior of skin: Secondary | ICD-10-CM | POA: Diagnosis not present

## 2023-01-26 ENCOUNTER — Other Ambulatory Visit: Payer: Self-pay | Admitting: Internal Medicine

## 2023-01-26 DIAGNOSIS — N401 Enlarged prostate with lower urinary tract symptoms: Secondary | ICD-10-CM

## 2023-01-26 DIAGNOSIS — I10 Essential (primary) hypertension: Secondary | ICD-10-CM

## 2023-01-26 DIAGNOSIS — E1169 Type 2 diabetes mellitus with other specified complication: Secondary | ICD-10-CM

## 2023-01-26 DIAGNOSIS — E118 Type 2 diabetes mellitus with unspecified complications: Secondary | ICD-10-CM

## 2023-01-26 NOTE — Telephone Encounter (Signed)
Requests too soon for refill, last refill 09/09/22 for 90 and 1 refill.  Requested Prescriptions  Pending Prescriptions Disp Refills   amLODipine (NORVASC) 2.5 MG tablet [Pharmacy Med Name: AMLODIPINE TAB 2.5MG ] 90 tablet 1    Sig: TAKE 1 TABLET DAILY     Cardiovascular: Calcium Channel Blockers 2 Passed - 01/26/2023 10:48 AM      Passed - Last BP in normal range    BP Readings from Last 1 Encounters:  01/06/23 116/74         Passed - Last Heart Rate in normal range    Pulse Readings from Last 1 Encounters:  01/06/23 61         Passed - Valid encounter within last 6 months    Recent Outpatient Visits           2 weeks ago Type II diabetes mellitus with complication (HCC)   [place] [facility] at [facility], Nyoka Cowden, MD   5 months ago Essential (primary) hypertension   [place] [facility] at [facility], Nyoka Cowden, MD   8 months ago PHN (postherpetic neuralgia)   [place] [facility] at [facility], Nyoka Cowden, MD   8 months ago PHN (postherpetic neuralgia)   [place] [facility] at [facility], Nyoka Cowden, MD   9 months ago Herpes zoster without complication   [place] [facility] at [facility], Nyoka Cowden, MD       Future Appointments             In 3 months Judithann Graves Nyoka Cowden, MD [facility] Primary Care & Sports Medicine at [facility], [facility]   In 3 months Reubin Milan, MD [facility] Primary [facility] & [facility] at [facility], [facility]             JANUVIA 100 MG tablet [Pharmacy Med Name: JANUVIA TAB 100MG ] 90 tablet 1    Sig: TAKE 1 TABLET DAILY     Endocrinology:  Diabetes - DPP-4 Inhibitors Passed - 01/26/2023 10:48 AM      Passed - HBA1C is between 0 and 7.9 and within 180 days    Hgb A1c MFr Bld  Date Value Ref Range Status  01/06/2023 6.6 (H) 4.8 - 5.6 % Final    Comment:              Prediabetes: 5.7 - 6.4          Diabetes: >6.4          Glycemic control for adults with diabetes: <7.0          Passed - Cr in normal range and within 360 days    Creatinine, Ser  Date Value Ref Range Status  01/06/2023 1.27 0.76 - 1.27 mg/dL Final         Passed - Valid encounter within last 6 months    Recent Outpatient Visits           2 weeks ago Type II diabetes mellitus with complication [facility])   [place] [facility] at [facility], Nyoka Cowden, MD   5 months ago Essential (primary) hypertension   [place] [facility] at [facility], Nyoka Cowden, MD   8 months ago PHN (postherpetic neuralgia)   [facility] at [facility],  Nyoka Cowden, MD   8 months ago PHN (postherpetic neuralgia)   [place] [facility] at [facility], Nyoka Cowden, MD   9 months ago Herpes zoster without complication   [place] [facility] at [facility], Nyoka Cowden, MD       Future Appointments             In 3 months Judithann Graves Nyoka Cowden, MD [facility] at [facility], [facility]   In 3 months Reubin Milan, MD [facility] Primary Care & Sports Medicine at [facility], [facility]             rosuvastatin (CRESTOR) 20 MG tablet [Pharmacy Med Name: ROSUVASTATIN TAB 20MG ] 90 tablet 1    Sig: TAKE 1 TABLET AT BEDTIME     Cardiovascular:  Antilipid - Statins 2 Failed - 01/26/2023 10:48 AM      Failed - Lipid Panel in normal range within the last 12 months    Cholesterol, Total  Date Value Ref Range Status  04/09/2022 138 100 - 199 mg/dL Final   LDL Chol Calc (NIH)  Date Value Ref Range Status  04/09/2022 76 0 - 99 mg/dL Final   HDL  Date Value Ref Range Status  04/09/2022 40 >39 mg/dL Final   Triglycerides  Date Value Ref Range Status  04/09/2022 119 0 - 149 mg/dL Final          Passed - Cr in normal range and within 360 days    Creatinine, Ser  Date Value Ref Range Status  01/06/2023 1.27 0.76 - 1.27 mg/dL Final         Passed - Patient is not pregnant      Passed - Valid encounter within last 12 months    Recent Outpatient Visits           2 weeks ago Type II diabetes mellitus with complication [facility])   [place] [facility] at [facility], Nyoka Cowden, MD   5 months ago Essential (primary) hypertension   [place] [facility] at [facility], Nyoka Cowden, MD   8 months ago PHN (postherpetic neuralgia)   [place] [facility] at [facility], Nyoka Cowden, MD   8 months ago PHN (postherpetic neuralgia)   [place] [facility] at [facility], Nyoka Cowden, MD   9 months ago Herpes zoster without complication    Primary Care & Sports Medicine at [facility], Nyoka Cowden, MD       Future Appointments             In 3 months Judithann Graves Nyoka Cowden, MD [facility] Primary Care & Sports Medicine at [facility], [facility]   In 3 months Judithann Graves, Nyoka Cowden, MD [facility] Primary Care & Sports Medicine at [facility], [facility]             tamsulosin (FLOMAX) 0.4 MG CAPS capsule [Pharmacy Med Name: TAMSULOSIN CAP 0.4MG ] 90 capsule 1    Sig: TAKE 1 CAPSULE DAILY AFTER SUPPER     Urology: Alpha-Adrenergic Blocker Failed - 01/26/2023 10:48 AM      Failed - PSA in normal range and within 360 days    PSA  Date Value Ref Range Status  04/10/2017 1.83  Final   Prostate Specific Ag, Serum  Date Value Ref Range  Status  02/15/2020 0.7 0.0 - 4.0 ng/mL Final    Comment:    Roche ECLIA methodology. According to the American Urological Association, Serum PSA should decrease and remain at undetectable levels after radical prostatectomy. The AUA defines biochemical recurrence as an initial PSA value 0.2 ng/mL or greater followed by  a subsequent confirmatory PSA value 0.2 ng/mL or greater. Values obtained with different assay methods or kits cannot be used interchangeably. Results cannot be interpreted as absolute evidence of the presence or absence of malignant disease.          Passed - Last BP in normal range    BP Readings from Last 1 Encounters:  01/06/23 116/74         Passed - Valid encounter within last 12 months    Recent Outpatient Visits           2 weeks ago Type II diabetes mellitus with complication [facility])   [place] [facility] at [facility], Nyoka Cowden, MD   5 months ago Essential (primary) hypertension   [place] [facility] at [facility], Nyoka Cowden, MD   8 months ago PHN (postherpetic neuralgia)   [place] [facility] at [facility], Nyoka Cowden, MD   8 months ago PHN (postherpetic neuralgia)   [place] [facility] at [facility], Nyoka Cowden, MD   9 months ago Herpes zoster without complication   [place] [facility] at [facility], Nyoka Cowden, MD       Future Appointments             In 3 months Reubin Milan, MD [facility] at [facility], [facility]   In 3 months Reubin Milan, MD [facility] Primary [facility] & [facility] at [facility], [facility]             atenolol (TENORMIN) 50 MG tablet [Pharmacy Med Name: ATENOLOL TAB 50MG ] 90 tablet 1    Sig: TAKE 1 TABLET AT BEDTIME     Cardiovascular: Beta Blockers 2 Passed - 01/26/2023 10:48 AM      Passed - Cr in normal range and within 360 days    Creatinine, Ser  Date Value Ref Range Status  01/06/2023 1.27 0.76 - 1.27 mg/dL Final         Passed - Last BP in normal range    BP Readings from Last 1 Encounters:  01/06/23 116/74         Passed - Last Heart Rate in normal range    Pulse Readings from Last 1 Encounters:   01/06/23 61         Passed - Valid encounter within last 6 months    Recent Outpatient Visits           2 weeks ago Type II diabetes mellitus with complication (HCC)   [place] [facility] at [facility], Nyoka Cowden, MD   5 months ago Essential (primary) hypertension   [place] [facility] at [facility], Nyoka Cowden, MD   8 months ago PHN (postherpetic neuralgia)   [place] [facility] at [facility], Nyoka Cowden, MD   8 months ago PHN (postherpetic neuralgia)   [facility] at [facility], Nyoka Cowden, MD  9 months ago Herpes zoster without complication   [place] [facility] at [facility], Nyoka Cowden, MD       Future Appointments             In 3 months Judithann Graves Nyoka Cowden, MD [facility] Primary Care & Sports Medicine at [facility], [facility]   In 3 months Judithann Graves Nyoka Cowden, MD [facility] Primary Care & Sports Medicine at [facility], [facility]             finasteride (PROSCAR) 5 MG tablet [Pharmacy Med Name: FINASTERIDE  TAB 5MG ] 90 tablet 1    Sig: TAKE 1 TABLET DAILY     Urology: 5-alpha Reductase Inhibitors Failed - 01/26/2023 10:48 AM      Failed - PSA in normal range and within 360 days    PSA  Date Value Ref Range Status  04/10/2017 1.83  Final   Prostate Specific Ag, Serum  Date Value Ref Range Status  02/15/2020 0.7 0.0 - 4.0 ng/mL Final    Comment:    Roche ECLIA methodology. According to the American Urological Association, Serum PSA should decrease and remain at undetectable levels after radical prostatectomy. The AUA defines biochemical recurrence as an initial PSA value 0.2 ng/mL or greater followed by a subsequent confirmatory PSA value 0.2 ng/mL or greater. Values obtained with different assay methods or kits cannot be used interchangeably. Results cannot be interpreted as  absolute evidence of the presence or absence of malignant disease.          Passed - Valid encounter within last 12 months    Recent Outpatient Visits           2 weeks ago Type II diabetes mellitus with complication [facility])   [place] [facility] at [facility], Nyoka Cowden, MD   5 months ago Essential (primary) hypertension   [place] [facility] at [facility], Nyoka Cowden, MD   8 months ago PHN (postherpetic neuralgia)   [place] [facility] at [facility], Nyoka Cowden, MD   8 months ago PHN (postherpetic neuralgia)   [place] [facility] at [facility], Nyoka Cowden, MD   9 months ago Herpes zoster without complication    Primary Care & Sports Medicine at [facility], Nyoka Cowden, MD       Future Appointments             In 3 months Judithann Graves Nyoka Cowden, MD [facility] Primary Care & Sports Medicine at [facility], [facility]   In 3 months Reubin Milan, MD [facility] at [facility], [facility]             losartan (COZAAR) 100 MG tablet [Pharmacy Med Name: LOSARTAN POT TAB 100MG ] 90 tablet 1    Sig: TAKE 1/2 TABLET (50MG       TOTAL) TWO TIMES A DAY     Cardiovascular:  Angiotensin Receptor Blockers Passed - 01/26/2023 10:48 AM      Passed - Cr in normal range and within 180 days    Creatinine, Ser  Date Value Ref Range Status  01/06/2023 1.27 0.76 - 1.27 mg/dL Final         Passed - K in normal range and within 180 days    Potassium  Date Value Ref Range Status  01/06/2023 4.4 3.5 -  5.2 mmol/L Final  01/28/2011 4.2 3.5 - 5.1 mmol/L Final    Comment:    POTASSIUM - Slight hemolysis, interpret results with  - caution.          Passed - Patient is not pregnant      Passed - Last BP in normal range    BP Readings from Last 1 Encounters:  01/06/23 116/74         Passed - Valid  encounter within last 6 months    Recent Outpatient Visits           2 weeks ago Type II diabetes mellitus with complication [facility])   [place] [facility] at [facility], Nyoka Cowden, MD   5 months ago Essential (primary) hypertension   [place] [facility] at [facility], Nyoka Cowden, MD   8 months ago PHN (postherpetic neuralgia)   [place] [facility] Medicine at [facility], Nyoka Cowden, MD   8 months ago PHN (postherpetic neuralgia)   [place] [facility] at [facility], Nyoka Cowden, MD   9 months ago Herpes zoster without complication   [facility] Primary Care & Sports Medicine at [facility], Nyoka Cowden, MD       Future Appointments             In 3 months Judithann Graves, Nyoka Cowden, MD [facility] Primary Care & Sports Medicine at [facility], [facility]   In 3 months Judithann Graves, Nyoka Cowden, MD [facility] Primary Care & Sports Medicine at [facility], [facility]

## 2023-02-03 DIAGNOSIS — C44712 Basal cell carcinoma of skin of right lower limb, including hip: Secondary | ICD-10-CM | POA: Diagnosis not present

## 2023-02-03 DIAGNOSIS — D485 Neoplasm of uncertain behavior of skin: Secondary | ICD-10-CM | POA: Diagnosis not present

## 2023-02-04 ENCOUNTER — Ambulatory Visit: Payer: Medicare HMO

## 2023-02-04 ENCOUNTER — Ambulatory Visit (INDEPENDENT_AMBULATORY_CARE_PROVIDER_SITE_OTHER): Payer: Medicare HMO

## 2023-02-04 DIAGNOSIS — Z0000 Encounter for general adult medical examination without abnormal findings: Secondary | ICD-10-CM | POA: Diagnosis not present

## 2023-02-04 NOTE — Patient Instructions (Addendum)
Maxwell Stanley , Thank you for taking time to come for your Medicare Wellness Visit. I appreciate your ongoing commitment to your health goals. Please review the following plan we discussed and let me know if I can assist you in the future.   Referrals/Orders/Follow-Ups/Clinician Recommendations: NONE  This is a list of the screening recommended for you and due dates:  Health Maintenance  Topic Date Due   DTaP/Tdap/Td vaccine (2 - Td or Tdap) 10/02/2020   COVID-19 Vaccine (8 - 2024-25 season) 12/02/2022   Eye exam for diabetics  12/26/2022   Complete foot exam   04/09/2023   Hemoglobin A1C  07/06/2023   Medicare Annual Wellness Visit  02/04/2024   Pneumonia Vaccine  Completed   Flu Shot  Completed   Zoster (Shingles) Vaccine  Completed   HPV Vaccine  Aged Out    Advanced directives: (In Chart) A copy of your advanced directives are scanned into your chart should your provider ever need it.  Next Medicare Annual Wellness Visit scheduled for next year: Yes   02/10/24 @ 2:00 PM BY PHONE

## 2023-02-04 NOTE — Progress Notes (Cosign Needed)
Subjective:   Maxwell Stanley is a 89 y.o. male who presents for Medicare Annual/Subsequent preventive examination.  Visit Complete: Virtual I connected with  Sharlyne Pacas on 02/04/23 by a audio enabled telemedicine application and verified that I am speaking with the correct person using two identifiers.  This patient declined Interactive audio and Acupuncturist. Therefore the visit was completed with audio only.   Patient Location: Home  Provider Location: Office/Clinic  I discussed the limitations of evaluation and management by telemedicine. The patient expressed understanding and agreed to proceed.  Vital Signs: Because this visit was a virtual/telehealth visit, some criteria may be missing or patient reported. Any vitals not documented were not able to be obtained and vitals that have been documented are patient reported.  Cardiac Risk Factors include: advanced age (>88men, >7 women);diabetes mellitus;dyslipidemia;hypertension;male gender     Objective:    Today's Vitals   02/04/23 1339  PainSc: 0-No pain   There is no height or weight on file to calculate BMI.     02/04/2023    1:45 PM 10/16/2022    9:57 AM 10/16/2022    9:56 AM 07/14/2022    6:30 AM 06/30/2022    1:44 PM 05/15/2022    9:09 AM 01/24/2022    9:51 AM  Advanced Directives  Does Patient Have a Medical Advance Directive? Yes Yes Yes Yes Yes Yes Yes  Type of Estate agent of [place];Living will Healthcare Power of [place];Living will Healthcare Power of [place];Living will Healthcare Power of [place];Living will  Living will;Healthcare Power of [employer] Power of [place];Living will  Does patient want to make changes to medical advance directive? No - Patient declined  No - Patient declined No - Patient declined Yes (MAU/Ambulatory/Procedural Areas - Information given) Yes (ED - Information included in AVS) No - Patient declined  Copy of Healthcare Power of  Attorney in Chart? Yes - validated most recent copy scanned in chart (See row information) Yes - validated most recent copy scanned in chart (See row information) No - copy requested    No - copy requested    Current Medications (verified) Outpatient Encounter Medications as of 02/04/2023  Medication Sig   acetaminophen (TYLENOL) 500 MG tablet Take 1,000 mg by mouth 2 (two) times daily.   amLODipine (NORVASC) 2.5 MG tablet TAKE 1 TABLET DAILY   aspirin EC 81 MG tablet Take 1 tablet (81 mg total) by mouth in the morning and at bedtime. Swallow whole.   atenolol (TENORMIN) 50 MG tablet TAKE 1 TABLET AT BEDTIME   cholecalciferol (VITAMIN D) 1000 UNITS tablet Take 1,000 Units by mouth daily.   docusate sodium (COLACE) 100 MG capsule Take 200 mg by mouth daily.    ferrous sulfate 325 (65 FE) MG EC tablet Take 1 tablet by mouth daily.   finasteride (PROSCAR) 5 MG tablet TAKE 1 TABLET DAILY   fluticasone (FLONASE) 50 MCG/ACT nasal spray USE 2 SPRAYS IN EACH       NOSTRIL DAILY (Patient taking differently: Place 2 sprays into both nostrils daily as needed for allergies or rhinitis.)   glucose blood (ONETOUCH ULTRA) test strip Use as instructed   lidocaine (LMX) 4 % cream Apply 1 Application topically as needed.   losartan (COZAAR) 100 MG tablet TAKE 1/2 TABLET (50MG       TOTAL) TWO TIMES A DAY   Melatonin 10 MG CAPS Take 10 mg by mouth at bedtime.   metFORMIN (GLUCOPHAGE-XR) 500 MG 24 hr tablet TAKE  1 TABLET TWICE DAILY  WITH MEALS   Multiple Vitamin (MULTIVITAMIN WITH MINERALS) TABS tablet Take 1 tablet by mouth daily.   OneTouch Delica Lancets 33G MISC 1 each by Does not apply route daily. Use to test blood sugar once daily.   pantoprazole (PROTONIX) 40 MG tablet TAKE 1 TABLET TWICE A DAY   Probiotic Product (PROBIOTIC DAILY PO) Take 1 tablet by mouth daily.   rosuvastatin (CRESTOR) 20 MG tablet TAKE 1 TABLET AT BEDTIME   scopolamine (TRANSDERM SCOP, 1.5 MG,) 1 MG/3DAYS Place 1 patch (1.5 mg total)  onto the skin every 3 (three) days.   sitaGLIPtin (JANUVIA) 100 MG tablet TAKE 1 TABLET DAILY   tamsulosin (FLOMAX) 0.4 MG CAPS capsule TAKE 1 CAPSULE DAILY AFTER SUPPER   celecoxib (CELEBREX) 200 MG capsule Take 1 capsule (200 mg total) by mouth 2 (two) times daily. (Patient not taking: Reported on 02/04/2023)   Misc Natural Products (OSTEO BI-FLEX TRIPLE STRENGTH PO) Take 1 tablet by mouth daily. (Patient not taking: Reported on 02/04/2023)   No facility-administered encounter medications on file as of 02/04/2023.    Allergies (verified) Codeine, Ceftriaxone, and Penicillin g   History: Past Medical History:  Diagnosis Date   A-fib (HCC)    a.) CHA2DS2VASc = 5 (age x2, HTN, vascular disease history, T2DM);  b.) s/p Watchman 08/21/2021; c.) rate/rhythm maintained on oral atenolol; chronically anticoagulated with reduced dose apixaban with plans to transition to DAPT (ASA + clopidogrel) x 4 months following THA on 10/21/2021   Allergy    Aortic atherosclerosis (HCC)    Basal cell carcinoma 11/28/2019   R neck post auricular, EDC  NODULAR AND INFILTRATIVE PATTERNS   Basal cell carcinoma 12/21/2019   R lateral calf, treated wiht EDC 01/26/2020   BCC (basal cell carcinoma) 06/19/2020   left distal calf, EDC 08/09/2020   Bilateral inguinal hernia    a.) s/p repair   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    Carpal tunnel syndrome on left    Cataract 11/2018   Chronic back pain    CKD stage 3 secondary to diabetes (HCC)    DDD (degenerative disc disease), lumbar    Diastolic dysfunction 11/19/2020   a.) TTE 11/19/2020: EF >55%, mild LVH, mod BAE, mild RVE, triv AR/PR, mod MR/TR, G1DD   Elevated PSA    GERD (gastroesophageal reflux disease)    History of basal cell carcinoma (BCC) 08/21/2020   right chest pectoral tx'd with Moh's 08/21/2020   History of squamous cell carcinoma of skin 2021   left ear/Moh's   Hyperlipidemia    Hypertension    IDA (iron deficiency  anemia)    Leg varices    Lumbar spinal stenosis    Nocturia    Organic impotence    Osteoarthritis    PONV (postoperative nausea and vomiting)    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   left proximal calf, EDC 08/09/2020   Squamous cell carcinoma of skin 11/28/2019   L ear sup helix  WELL DIFFERENTIATED   T2DM (type 2 diabetes mellitus) (HCC)    Thrombophilia (HCC)    Type 2 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)    Umbilical hernia    a.) s/p repair   Past Surgical History:  Procedure Laterality Date   ANKLE ARTHROSCOPY WITH OPEN REDUCTION INTERNAL FIXATION (ORIF) Left 1998   COLONOSCOPY     COLONOSCOPY WITH PROPOFOL N/A  05/28/2021   Procedure: COLONOSCOPY WITH PROPOFOL;  Surgeon: Midge Minium, MD;  Location: [facility] ENDOSCOPY;  Service: Endoscopy;  Laterality: N/A;   ESOPHAGOGASTRODUODENOSCOPY (EGD) WITH PROPOFOL N/A 05/28/2021   Procedure: ESOPHAGOGASTRODUODENOSCOPY (EGD) WITH PROPOFOL;  Surgeon: Midge Minium, MD;  Location: [facility] ENDOSCOPY;  Service: Endoscopy;  Laterality: N/A;   ETHMOIDECTOMY Left 03/08/2020   Procedure: TOTAL ETHMOIDECTOMY;  Surgeon: Vernie Murders, MD;  Location: [facility];  Service: ENT;  Laterality: Left;   FRONTAL SINUS EXPLORATION Left 03/08/2020   Procedure: FRONTAL SINUS EXPLORATION;  Surgeon: Vernie Murders, MD;  Location: [facility] SURGERY [facility];  Service: ENT;  Laterality: Left;   GIVENS CAPSULE STUDY N/A 07/11/2021   Procedure: GIVENS CAPSULE STUDY;  Surgeon: Midge Minium, MD;  Location: [facility] ENDOSCOPY;  Service: Endoscopy;  Laterality: N/A;   HEMORRHOID SURGERY     IMAGE GUIDED SINUS SURGERY N/A 03/08/2020   Procedure: IMAGE GUIDED SINUS SURGERY;  Surgeon: Vernie Murders, MD;  Location: [facility] SURGERY CNTR;  Service: ENT;  Laterality: N/A;  placed disk on OR charge nurse desk 2-21 kp PLACED 2ND DISK ON OR CHARGE NURSE DESK 02-28-20 KP 3RD DISK ON OR CHARGE NURSE  DESK 2-25- KP   INGUINAL HERNIA REPAIR Right 11/12/2018   Procedure: HERNIA REPAIR INGUINAL ADULT;  Surgeon: Duanne Guess, MD;  Location: [facility] ORS;  Service: General;  Laterality: Right;   INGUINAL HERNIA REPAIR Left 2012   JOINT REPLACEMENT     KNEE ARTHROPLASTY Right 04/16/2020   Procedure: COMPUTER ASSISTED TOTAL KNEE ARTHROPLASTY;  Surgeon: Donato Heinz, MD;  Location: [facility] ORS;  Service: Orthopedics;  Laterality: Right;   KNEE ARTHROSCOPY Right 2006   MAXILLARY ANTROSTOMY Left 03/08/2020   Procedure: MAXILLARY ANTROSTOMY WITH TISSUE REMOVAL;  Surgeon: Vernie Murders, MD;  Location: [facility];  Service: ENT;  Laterality: Left;   SEPTOPLASTY N/A 03/08/2020   Procedure: SEPTOPLASTY;  Surgeon: Vernie Murders, MD;  Location: [facility];  Service: ENT;  Laterality: N/A;   skin cancer resection Right 04/07/2018   ON hand.   THUMB ARTHROSCOPY Right 2013   tendon flap to joint   TONSILLECTOMY     TOTAL HIP ARTHROPLASTY Right 10/21/2021   Procedure: TOTAL HIP ARTHROPLASTY;  Surgeon: Donato Heinz, MD;  Location: [facility] ORS;  Service: Orthopedics;  Laterality: Right;   TOTAL HIP ARTHROPLASTY Left 07/14/2022   Procedure: TOTAL HIP ARTHROPLASTY;  Surgeon: Donato Heinz, MD;  Location: [facility] ORS;  Service: Orthopedics;  Laterality: Left;   UMBILICAL HERNIA REPAIR Left 2012   UPPER GI ENDOSCOPY     Family History  Problem Relation Age of Onset   Diabetes Mother    Coronary artery disease Father    Hypertension Father    Hyperlipidemia Father    Leukemia Father    Social History   Socioeconomic History   Marital status: Married    Spouse name: Bonita Quin   Number of children: 2   Years of education: some college   Highest education level: Some college, no degree  Occupational History   Occupation: Retired  Tobacco Use   Smoking status: Never   Smokeless tobacco: Never  Vaping Use   Vaping status: Never Used  Substance and Sexual Activity   Alcohol use: No     Alcohol/week: 0.0 standard drinks of alcohol   Drug use: No   Sexual activity: Not Currently    Birth control/protection: None  Other Topics Concern   Not on file  Social History Narrative   Caffeine in moderation. 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   Social Drivers of Corporate investment banker Strain: Low Risk  (02/04/2023)   Overall Financial Resource Strain (CARDIA)    Difficulty of Paying Living Expenses: Not hard at all  Food Insecurity: No Food Insecurity (02/04/2023)   Hunger Vital Sign    Worried About Running Out of Food in the Last Year: Never true    Ran Out of Food in the Last Year: Never true  Transportation Needs: No Transportation Needs (02/04/2023)   PRAPARE - Administrator, Civil Service (Medical): No    Lack of Transportation (Non-Medical): No  Physical Activity: Sufficiently Active (02/04/2023)   Exercise Vital Sign    Days of Exercise per Week: 5 days    Minutes of Exercise per Session: 30 min  Stress: No Stress Concern Present (02/04/2023)   [employer] of Occupational Health - Occupational Stress Questionnaire    Feeling of Stress : Not at all  Social Connections: Socially Integrated (02/04/2023)   Social Connection and Isolation Panel [NHANES]    Frequency of Communication with Friends and Family: More than three times a week    Frequency of Social Gatherings with Friends and Family: More than three times a week    Attends Religious Services: More than 4 times per year    Active Member of [employer] or Organizations: Yes    Attends Engineer, structural: More than 4 times per year    Marital Status: Married    Tobacco Counseling Counseling given: Not Answered   Clinical Intake:  Pre-visit preparation completed: Yes  Pain : No/denies pain Pain Score: 0-No pain     BMI - recorded: 21.7 Nutritional Status: BMI of 19-24  Normal Nutritional Risks: None Diabetes: Yes CBG done?: No Did pt. bring in  CBG monitor from home?: No  How often do you need to have someone help you when you read instructions, pamphlets, or other written materials from your doctor or pharmacy?: 1 - Never  Interpreter Needed?: No  Information entered by :: Kennedy Bucker, LPN   Activities of Daily Living    02/04/2023    1:46 PM 02/03/2023    9:19 AM  In your present state of health, do you have any difficulty performing the following activities:  Hearing? 0 0  Vision? 0 0  Difficulty concentrating or making decisions? 0 0  Walking or climbing stairs? 1 0  Comment KNEE GOING UP HAS PAIN   Dressing or bathing? 0 0  Doing errands, shopping? 0 0  Preparing Food and eating ? N N  Using the Toilet? N N  In the past six months, have you accidently leaked urine? N N  Do you have problems with loss of bowel control? N N  Managing your Medications? N N  Managing your Finances? N N  Housekeeping or managing your Housekeeping? N N    Patient Care Team: Reubin Milan, MD as PCP - General (Internal Medicine) Anthonette Legato, OD as Consulting Physician (Optometry) Lamar Blinks, MD as Consulting Physician (Cardiology) Deirdre Evener, MD (Dermatology) Merri Ray, MD as Referring Physician (Physical Medicine and Rehabilitation) Carmina Miller, MD as Consulting Physician (Radiation Oncology)  Indicate any recent Medical Services you may have received from other than [facility] providers in the past year (date may be approximate).     Assessment:   This is a routine wellness examination for Baltasar.  Hearing/Vision screen Hearing Screening - Comments:: NO  AIDS Vision Screening - Comments:: WEARS GLASSES ALL THE TIME- DR.BENFIELD   Goals Addressed             This Visit's Progress    DIET - EAT MORE FRUITS AND VEGETABLES         Depression Screen    02/04/2023    1:44 PM 08/11/2022    8:21 AM 05/20/2022    1:34 PM 04/29/2022    8:59 AM 04/09/2022    9:00 AM 01/10/2022   10:05 AM  06/20/2021    8:02 AM  PHQ 2/9 Scores  PHQ - 2 Score 0 0 0 0 0 0 0  PHQ- 9 Score 0 0 0 2 0  2    Fall Risk    02/04/2023    1:46 PM 02/03/2023    9:19 AM 08/11/2022    8:21 AM 05/20/2022    1:34 PM 04/29/2022    8:59 AM  Fall Risk   Falls in the past year? 1 1 0 1 1  Number falls in past yr: 1 0 0 1 1  Injury with Fall? 0  0 0 0  Risk for fall due to :   No Fall Risks No Fall Risks No Fall Risks  Follow up Falls evaluation completed;Falls prevention discussed  Falls evaluation completed Falls evaluation completed Falls evaluation completed    MEDICARE RISK AT HOME: Medicare Risk at Home Any stairs in or around the home?: Yes If so, are there any without handrails?: No Home free of loose throw rugs in walkways, pet beds, electrical cords, etc?: Yes Adequate lighting in your home to reduce risk of falls?: Yes Life alert?: No Use of a cane, walker or w/c?: No Grab bars in the bathroom?: Yes Shower chair or bench in shower?: Yes Elevated toilet seat or a handicapped toilet?: Yes  TIMED UP AND GO:  Was the test performed?  No    Cognitive Function:    08/13/2015    9:34 AM  MMSE - Mini Mental State Exam  Orientation to time 5  Orientation to Place 5  Registration 3  Attention/ Calculation 5  Recall 3  Language- name 2 objects 2        02/04/2023    1:47 PM 01/06/2023    8:59 AM 01/10/2022   10:07 AM 11/23/2019    3:00 PM 11/22/2018    3:08 PM  6CIT Screen  What Year? 0 points 0 points 0 points 0 points 0 points  What month? 0 points 0 points 0 points 0 points 0 points  What time? 0 points 0 points 0 points 0 points 0 points  Count back from 20 0 points 0 points 0 points 0 points 0 points  Months in reverse 0 points 0 points 0 points 0 points 0 points  Repeat phrase 0 points 2 points 0 points 0 points 0 points  Total Score 0 points 2 points 0 points 0 points 0 points    Immunizations Immunization History  Administered Date(s) Administered   Fluad Quad(high Dose  65+) 09/16/2018, 09/27/2019, 10/05/2020   Influenza, High Dose Seasonal PF 10/13/2016, 09/09/2017, 11/09/2021, 08/25/2022   Influenza,inj,Quad PF,6+ Mos 09/26/2014   Influenza-Unspecified 09/26/2014, 10/13/2016   Moderna Covid-19 Fall Seasonal Vaccine 32yrs & older 11/09/2021, 10/07/2022   PFIZER(Purple Top)SARS-COV-2 Vaccination 01/22/2019, 02/13/2019, 10/20/2019, 04/07/2020   Pfizer Covid-19 Vaccine Bivalent Booster 75yrs & up 10/05/2020   Pneumococcal Conjugate-13 10/26/2013   Pneumococcal Polysaccharide-23 01/31/2011   Respiratory Syncytial Virus Vaccine,Recomb Aduvanted(Arexvy) 11/25/2021  Tdap 10/03/2010   Zoster Recombinant(Shingrix) 08/12/2017, 10/22/2017   Zoster, Live 04/16/2000    TDAP status: Due, Education has been provided regarding the importance of this vaccine. Advised may receive this vaccine at local pharmacy or Health Dept. Aware to provide a copy of the vaccination record if obtained from local pharmacy or Health Dept. Verbalized acceptance and understanding.  Flu Vaccine status: Up to date  Pneumococcal vaccine status: Declined,  Education has been provided regarding the importance of this vaccine but patient still declined. Advised may receive this vaccine at local pharmacy or Health Dept. Aware to provide a copy of the vaccination record if obtained from local pharmacy or Health Dept. Verbalized acceptance and understanding.   Covid-19 vaccine status: Completed vaccines  Qualifies for Shingles Vaccine? Yes   Zostavax completed Yes   Shingrix Completed?: Yes  Screening Tests Health Maintenance  Topic Date Due   DTaP/Tdap/Td (2 - Td or Tdap) 10/02/2020   COVID-19 Vaccine (8 - 2024-25 season) 12/02/2022   OPHTHALMOLOGY EXAM  12/26/2022   FOOT EXAM  04/09/2023   HEMOGLOBIN A1C  07/06/2023   Medicare Annual Wellness (AWV)  02/04/2024   Pneumonia Vaccine 2+ Years old  Completed   INFLUENZA VACCINE  Completed   Zoster Vaccines- Shingrix  Completed   HPV  VACCINES  Aged Out    Health Maintenance  Health Maintenance Due  Topic Date Due   DTaP/Tdap/Td (2 - Td or Tdap) 10/02/2020   COVID-19 Vaccine (8 - 2024-25 season) 12/02/2022   OPHTHALMOLOGY EXAM  12/26/2022    Colorectal cancer screening: No longer required.   Lung Cancer Screening: (Low Dose CT Chest recommended if Age 21-80 years, 20 pack-year currently smoking OR have quit w/in 15years.) does not qualify.    Additional Screening:  Hepatitis C Screening: does not qualify; Completed NO  Vision Screening: Recommended annual ophthalmology exams for early detection of glaucoma and other disorders of the eye. Is the patient up to date with their annual eye exam?  Yes  Who is the provider or what is the name of the office in which the patient attends annual eye exams? DR.BENFIELD If pt is not established with a provider, would they like to be referred to a provider to establish care? No .   Dental Screening: Recommended annual dental exams for proper oral hygiene  Diabetic Foot Exam: Diabetic Foot Exam: Completed 04/09/22  Community Resource Referral / Chronic Care Management: CRR required this visit?  No   CCM required this visit?  No     Plan:     I have personally reviewed and noted the following in the patient's chart:   Medical and social history Use of alcohol, tobacco or illicit drugs  Current medications and supplements including opioid prescriptions. Patient is not currently taking opioid prescriptions. Functional ability and status Nutritional status Physical activity Advanced directives List of other physicians Hospitalizations, surgeries, and ER visits in previous 12 months Vitals Screenings to include cognitive, depression, and falls Referrals and appointments  In addition, I have reviewed and discussed with patient certain preventive protocols, quality metrics, and best practice recommendations. A written personalized care plan for preventive services  as well as general preventive health recommendations were provided to patient.     Hal Hope, LPN   08/16/9145   After Visit Summary: (MyChart) Due to this being a telephonic visit, the after visit summary with patients personalized plan was offered to patient via MyChart   Nurse Notes: NONE

## 2023-02-15 ENCOUNTER — Other Ambulatory Visit: Payer: Self-pay | Admitting: Internal Medicine

## 2023-02-15 DIAGNOSIS — N401 Enlarged prostate with lower urinary tract symptoms: Secondary | ICD-10-CM

## 2023-02-15 DIAGNOSIS — I10 Essential (primary) hypertension: Secondary | ICD-10-CM

## 2023-02-15 DIAGNOSIS — E1169 Type 2 diabetes mellitus with other specified complication: Secondary | ICD-10-CM

## 2023-02-15 DIAGNOSIS — E118 Type 2 diabetes mellitus with unspecified complications: Secondary | ICD-10-CM

## 2023-02-16 NOTE — Telephone Encounter (Signed)
 Requested medications are due for refill today.  unsure  Requested medications are on the active medications list.  yes  Last refill. 06/30/2023  Future visit scheduled.   yes  Notes to clinic.  Expired labs.    Requested Prescriptions  Pending Prescriptions Disp Refills   tamsulosin  (FLOMAX ) 0.4 MG CAPS capsule [Pharmacy Med Name: TAMSULOSIN  CAP 0.4MG ] 90 capsule 1    Sig: TAKE 1 CAPSULE DAILY AFTER SUPPER     Urology: Alpha-Adrenergic Blocker Failed - 02/16/2023  3:38 PM      Failed - PSA in normal range and within 360 days    PSA  Date Value Ref Range Status  04/10/2017 1.83  Final   Prostate Specific Ag, Serum  Date Value Ref Range Status  02/15/2020 0.7 0.0 - 4.0 ng/mL Final    Comment:    Roche ECLIA methodology. According to the American Urological Association, Serum PSA should decrease and remain at undetectable levels after radical prostatectomy. The AUA defines biochemical recurrence as an initial PSA value 0.2 ng/mL or greater followed by a subsequent confirmatory PSA value 0.2 ng/mL or greater. Values obtained with different assay methods or kits cannot be used interchangeably. Results cannot be interpreted as absolute evidence of the presence or absence of malignant disease.          Passed - Last BP in normal range    BP Readings from Last 1 Encounters:  01/06/23 116/74         Passed - Valid encounter within last 12 months    Recent Outpatient Visits           1 month ago Type II diabetes mellitus with complication [facility])   [place] [facility] Medicine at [facility], [place], MD   6 months ago Essential (primary) hypertension   [place] [facility] Medicine at [facility], [place]ales [place], MD   9 months ago PHN (postherpetic neuralgia)   [place] [facility] Care & Sports Medicine at [facility], Chales [place], MD   9 months ago PHN (postherpetic neuralgia)   [place] Primary Care & Sports  Medicine at [facility], [place]ales [place], MD   9 months ago Herpes zoster without complication   [place] [facility] Care & Sports Medicine at [facility], Chales [place], MD       Future Appointments             In 2 months Gala Jubilee [place], MD [facility] Health Primary Care & Sports Medicine at [facility], [facility]   In 2 months Gala Jubilee [place], MD [facility] Primary Care & Sports Medicine at [facility], [facility]             finasteride  (PROSCAR ) 5 MG tablet [Pharmacy Med Name: FINASTERIDE   TAB 5MG ] 90 tablet 1    Sig: TAKE 1 TABLET DAILY     Urology: 5-alpha Reductase Inhibitors Failed - 02/16/2023  3:38 PM      Failed - PSA in normal range and within 360 days    PSA  Date Value Ref Range Status  04/10/2017 1.83  Final   Prostate Specific Ag, Serum  Date Value Ref Range Status  02/15/2020 0.7 0.0 - 4.0 ng/mL Final    Comment:    Roche ECLIA methodology. According to the American Urological Association, Serum PSA should decrease and remain at undetectable levels after radical prostatectomy. The AUA defines biochemical recurrence as an initial PSA value 0.2 ng/mL or  greater followed by a subsequent confirmatory PSA value 0.2 ng/mL or greater. Values obtained with different assay methods or kits cannot be used interchangeably. Results cannot be interpreted as absolute evidence of the presence or absence of malignant disease.          Passed - Valid encounter within last 12 months    Recent Outpatient Visits           1 month ago Type II diabetes mellitus with complication [facility])   [place] [facility] Sports Medicine at [facility], [place], MD   6 months ago Essential (primary) hypertension   [place] [facility] Medicine at [facility], [place], MD   9 months ago PHN (postherpetic neuralgia)   [place] Primary Care & Sports Medicine at [facility], [place]ales [place], MD   9 months ago PHN  (postherpetic neuralgia)   [place] Primary Care & Sports Medicine at [facility], [place], MD   9 months ago Herpes zoster without complication   [place] Primary Care & Sports Medicine at [facility], [place], MD       Future Appointments             In 2 months Gala Jubilee [place], MD [facility] Primary Care & Sports Medicine at [facility]   In 2 months Gala Jubilee [place], MD [facility] Health Primary Care & Sports Medicine at [facility]            Signed Prescriptions Disp Refills   amLODipine  (NORVASC ) 2.5 MG tablet 90 tablet 0    Sig: TAKE 1 TABLET DAILY     Cardiovascular: Calcium  Channel Blockers 2 Passed - 02/16/2023  3:38 PM      Passed - Last BP in normal range    BP Readings from Last 1 Encounters:  01/06/23 116/74         Passed - Last Heart Rate in normal range    Pulse Readings from Last 1 Encounters:  01/06/23 61         Passed - Valid encounter within last 6 months    Recent Outpatient Visits           1 month ago Type II diabetes mellitus with complication [facility])   [place] [facility] at [facility], [place], MD   6 months ago Essential (primary) hypertension   [place] Primary Care & Sports Medicine at [facility], [place], MD   9 months ago PHN (postherpetic neuralgia)   [place] Primary Care & Sports Medicine at [facility], [place], MD   9 months ago PHN (postherpetic neuralgia)    Primary Care & Sports Medicine at [facility], Ch[place], MD   9 months ago Herpes zoster without complication    Primary Care & Sports Medicine at [facility], [place], MD       Future Appointments             In 2 months Gala Jubilee [place], MD [facility] Health Primary Care & Sports Medicine at [facility]   In 2 months Gala Jubilee [place], MD [facility] Health Primary Care & Sports Medicine at [facility], [facility]             atenolol  (TENORMIN ) 50 MG tablet 90 tablet 0    Sig: TAKE 1 TABLET AT BEDTIME  Cardiovascular: Beta Blockers 2 Passed - 02/16/2023  3:38 PM      Passed - Cr in normal range and within 360 days    Creatinine, Ser  Date Value Ref Range Status  01/06/2023 1.27 0.76 - 1.27 mg/dL Final         Passed - Last BP in normal range    BP Readings from Last 1 Encounters:  01/06/23 116/74         Passed - Last Heart Rate in normal range    Pulse Readings from Last 1 Encounters:  01/06/23 61         Passed - Valid encounter within last 6 months    Recent Outpatient Visits           1 month ago Type II diabetes mellitus with complication [facility])   [place] Primary Care & Sports Medicine at [facility], Ch[place], MD   6 months ago Essential (primary) hypertension   [place] Primary Care & Sports Medicine at [facility], [place], MD   9 months ago PHN (postherpetic neuralgia)   [place] [facility] at [facility], [place], MD   9 months ago PHN (postherpetic neuralgia)   [place] Primary Care & Sports Medicine at [facility], Chales [place], MD   9 months ago Herpes zoster without complication   [place] Primary Care & Sports Medicine at [facility], [place], MD       Future Appointments             In 2 months Sheron Dixons, MD [facility] Primary Care & Sports Medicine at [facility], [facility]   In 2 months Sheron Dixons, MD [facility] Primary Care & Sports Medicine at [facility], [facility]             losartan  (COZAAR ) 100 MG tablet 90 tablet 0    Sig: TAKE 1/2 TABLET (50MG       TOTAL) TWO TIMES A DAY     Cardiovascular:  Angiotensin Receptor Blockers Passed - 02/16/2023  3:38 PM      Passed - Cr in normal range and within 180 days    Creatinine, Ser  Date Value Ref Range Status  01/06/2023 1.27 0.76 - 1.27 mg/dL Final         Passed - K  in normal range and within 180 days    Potassium  Date Value Ref Range Status  01/06/2023 4.4 3.5 - 5.2 mmol/L Final  01/28/2011 4.2 3.5 - 5.1 mmol/L Final    Comment:    POTASSIUM - Slight hemolysis, interpret results with  - caution.          Passed - Patient is not pregnant      Passed - Last BP in normal range    BP Readings from Last 1 Encounters:  01/06/23 116/74         Passed - Valid encounter within last 6 months    Recent Outpatient Visits           1 month ago Type II diabetes mellitus with complication [facility])   [place] [facility] Sports Medicine at [facility], [place], MD   6 months ago Essential (primary) hypertension   [place] Primary Care & Sports Medicine at [facility], [place], MD   9 months ago PHN (postherpetic neuralgia)   [place] Primary Care & Sports Medicine at  [facility]   9 months ago PHN (postherpetic neuralgia)   [place] Primary Care & Sports Medicine at [facility], Chales [place], MD   9 months ago Herpes zoster without complication   [place] Primary Care & Sports Medicine at [facility], Ch[place], MD       Future Appointments             In 2 months Sheron Dixons, MD [facility] Primary Care & Sports Medicine at [facility], [facility]   In 2 months Sheron Dixons, MD [facility] Primary Care & Sports Medicine at [facility], [facility]             rosuvastatin  (CRESTOR ) 20 MG tablet 90 tablet 0    Sig: TAKE 1 TABLET AT BEDTIME     Cardiovascular:  Antilipid - Statins 2 Failed - 02/16/2023  3:38 PM      Failed - Lipid Panel in normal range within the last 12 months    Cholesterol, Total  Date Value Ref Range Status  04/09/2022 138 100 - 199 mg/dL Final   LDL Chol Calc (NIH)  Date Value Ref Range Status  04/09/2022 76 0 - 99 mg/dL Final   HDL  Date Value Ref Range Status  04/09/2022 40 >39 mg/dL Final   Triglycerides  Date  Value Ref Range Status  04/09/2022 119 0 - 149 mg/dL Final         Passed - Cr in normal range and within 360 days    Creatinine, Ser  Date Value Ref Range Status  01/06/2023 1.27 0.76 - 1.27 mg/dL Final         Passed - Patient is not pregnant      Passed - Valid encounter within last 12 months    Recent Outpatient Visits           1 month ago Type II diabetes mellitus with complication [facility])   [place] Primary Care & Sports Medicine at [facility], [place], MD   6 months ago Essential (primary) hypertension   [place] Primary Care & Sports Medicine at [facility], Ch[place], MD   9 months ago PHN (postherpetic neuralgia)   [place] [facility] Care & Sports Medicine at [facility], [place], MD   9 months ago PHN (postherpetic neuralgia)   [place] Primary Care & Sports Medicine at [facility], Ch[place], MD   9 months ago Herpes zoster without complication   [place] Primary Care & Sports Medicine at [facility], Ch[place], MD       Future Appointments             In 2 months Gala Jubilee [place], MD [facility] Health Primary Care & Sports Medicine at [facility], [facility]   In 2 months Sheron Dixons, MD [facility] Primary Care & Sports Medicine at [facility], [facility]             metFORMIN  (GLUCOPHAGE -XR) 500 MG 24 hr tablet 180 tablet 0    Sig: TAKE 1 TABLET TWICE DAILY  WITH MEALS     Endocrinology:  Diabetes - Biguanides Failed - 02/16/2023  3:38 PM      Failed - eGFR in normal range and within 360 days    GFR calc Af Amer  Date Value Ref Range Status  05/19/2019 55 (L) >59 mL/min/1.73 Final    Comment:    **[facility]  currently reports eGFR in compliance with the current**   recommendations of the [employer]. [facility] will   update reporting as new guidelines are published from the NKF-ASN   Task force.    GFR, Estimated  Date Value Ref Range Status  06/30/2022 55 (L)  >60 mL/min Final    Comment:    (NOTE) Calculated using the CKD-EPI Creatinine Equation (2021)    eGFR  Date Value Ref Range Status  01/06/2023 55 (L) >59 mL/min/1.73 Final         Failed - B12 Level in normal range and within 720 days    Vitamin B-12  Date Value Ref Range Status  02/28/2021 1,594 (H) 232 - 1,245 pg/mL Final         Passed - Cr in normal range and within 360 days    Creatinine, Ser  Date Value Ref Range Status  01/06/2023 1.27 0.76 - 1.27 mg/dL Final         Passed - HBA1C is between 0 and 7.9 and within 180 days    Hgb A1c MFr Bld  Date Value Ref Range Status  01/06/2023 6.6 (H) 4.8 - 5.6 % Final    Comment:             Prediabetes: 5.7 - 6.4          Diabetes: >6.4          Glycemic control for adults with diabetes: <7.0          Passed - Valid encounter within last 6 months    Recent Outpatient Visits           1 month ago Type II diabetes mellitus with complication [facility])   [place] Primary Care & Sports Medicine at [facility] [place], MD   6 months ago Essential (primary) hypertension   [place] Primary Care & Sports Medicine at [facility], Chales [place], MD   9 months ago PHN (postherpetic neuralgia)   [place] Primary Care & Sports Medicine at [facility], Chales [place], MD   9 months ago PHN (postherpetic neuralgia)   [place] Primary Care & Sports Medicine at [facility], [place], MD   9 months ago Herpes zoster without complication   [place] Primary Care & Sports Medicine at [facility], [place], MD       Future Appointments             In 2 months Gala Jubilee [place], MD [facility] Health Primary Care & Sports Medicine at [facility], [facility]   In 2 months Sheron Dixons, MD [facility] Health Primary Care & Sports Medicine at [facility], [facility]            Passed - CBC within normal limits and completed in the last 12 months    WBC  Date Value Ref Range Status   06/30/2022 6.9 4.0 - 10.5 K/uL Final   RBC  Date Value Ref Range Status  06/30/2022 4.00 (L) 4.22 - 5.81 MIL/uL Final   Hemoglobin  Date Value Ref Range Status  06/30/2022 13.3 13.0 - 17.0 g/dL Final  16/10/9602 54.0 13.0 - 17.7 g/dL Final   HCT  Date Value Ref Range Status  06/30/2022 38.8 (L) 39.0 - 52.0 % Final   Hematocrit  Date Value Ref Range Status  04/09/2022 39.4 37.5 - 51.0 % Final   MCHC  Date Value Ref Range Status  06/30/2022 34.3 30.0 - 36.0  g/dL Final   [facility]  Date Value Ref Range Status  06/30/2022 33.3 26.0 - 34.0 pg Final   MCV  Date Value Ref Range Status  06/30/2022 97.0 80.0 - 100.0 fL Final  04/09/2022 94 79 - 97 fL Final  01/28/2011 96 80 - 100 fL Final   No results found for: "PLTCOUNTKUC", "LABPLAT", "POCPLA" RDW  Date Value Ref Range Status  06/30/2022 13.4 11.5 - 15.5 % Final  04/09/2022 13.5 11.6 - 15.4 % Final  01/28/2011 13.3 11.5 - 14.5 % Final

## 2023-02-16 NOTE — Telephone Encounter (Signed)
 Requested Prescriptions  Pending Prescriptions Disp Refills   amLODipine  (NORVASC ) 2.5 MG tablet [Pharmacy Med Name: AMLODIPINE  TAB 2.5MG ] 90 tablet 1    Sig: TAKE 1 TABLET DAILY     Cardiovascular: Calcium  Channel Blockers 2 Passed - 02/16/2023  3:36 PM      Passed - Last BP in normal range    BP Readings from Last 1 Encounters:  01/06/23 116/74         Passed - Last Heart Rate in normal range    Pulse Readings from Last 1 Encounters:  01/06/23 61         Passed - Valid encounter within last 6 months    Recent Outpatient Visits           1 month ago Type II diabetes mellitus with complication (HCC)   [place] [facility] at [facility], Chales [place], MD   6 months ago Essential (primary) hypertension   [place] [facility] Medicine at [facility], Chales [place], MD   9 months ago PHN (postherpetic neuralgia)   [place] [facility] Medicine at [facility], Chales [place], MD   9 months ago PHN (postherpetic neuralgia)   [place] [facility] at [facility], Chales [place], MD   9 months ago Herpes zoster without complication   [place] [facility] at [facility], Chales [place], MD       Future Appointments             In 2 months Gala Jubilee [place], MD [facility] Health Primary Care & Sports Medicine at [facility], [facility]   In 2 months Gala Jubilee, [place], MD [facility] Primary Care & Sports Medicine at [facility]             tamsulosin  (FLOMAX ) 0.4 MG CAPS capsule [Pharmacy Med Name: TAMSULOSIN  CAP 0.4MG ] 90 capsule 1    Sig: TAKE 1 CAPSULE DAILY AFTER SUPPER     Urology: Alpha-Adrenergic Blocker Failed - 02/16/2023  3:36 PM      Failed - PSA in normal range and within 360 days    PSA  Date Value Ref Range Status  04/10/2017 1.83  Final   Prostate Specific Ag, Serum  Date Value Ref Range Status  02/15/2020 0.7 0.0 - 4.0 ng/mL  Final    Comment:    Roche ECLIA methodology. According to the American Urological Association, Serum PSA should decrease and remain at undetectable levels after radical prostatectomy. The AUA defines biochemical recurrence as an initial PSA value 0.2 ng/mL or greater followed by a subsequent confirmatory PSA value 0.2 ng/mL or greater. Values obtained with different assay methods or kits cannot be used interchangeably. Results cannot be interpreted as absolute evidence of the presence or absence of malignant disease.          Passed - Last BP in normal range    BP Readings from Last 1 Encounters:  01/06/23 116/74         Passed - Valid encounter within last 12 months    Recent Outpatient Visits           1 month ago Type II diabetes mellitus with complication [facility])    Primary Care & Sports Medicine at [facility], [place], MD   6 months ago Essential (primary) hypertension   [facility] at [facility], Rice Chamorro  H, MD   9 months ago PHN (postherpetic neuralgia)   [place] Primary Care & Sports Medicine at [facility], Chales [place], MD   9 months ago PHN (postherpetic neuralgia)   [place] [facility] Care & Sports Medicine at [facility], Chales [place], MD   9 months ago Herpes zoster without complication   [place] Primary [facility] & Sports [facility] at [facility], [place]ales [place], MD       Future Appointments             In 2 months Gala Jubilee [place], MD [facility] Health Primary Care & Sports Medicine at [facility], [facility]   In 2 months Sheron Dixons, MD [facility] Health Primary Care & Sports Medicine at [facility], [facility]             atenolol  (TENORMIN ) 50 MG tablet [Pharmacy Med Name: ATENOLOL  TAB 50MG ] 90 tablet 1    Sig: TAKE 1 TABLET AT BEDTIME     Cardiovascular: Beta Blockers 2 Passed - 02/16/2023  3:36 PM      Passed - Cr in normal range and within 360 days     Creatinine, Ser  Date Value Ref Range Status  01/06/2023 1.27 0.76 - 1.27 mg/dL Final         Passed - Last BP in normal range    BP Readings from Last 1 Encounters:  01/06/23 116/74         Passed - Last Heart Rate in normal range    Pulse Readings from Last 1 Encounters:  01/06/23 61         Passed - Valid encounter within last 6 months    Recent Outpatient Visits           1 month ago Type II diabetes mellitus with complication [facility])   [place] Primary Care & Sports Medicine at [facility], Chales [place], MD   6 months ago Essential (primary) hypertension   [place] Primary Care & Sports Medicine at [facility], Chales [place], MD   9 months ago PHN (postherpetic neuralgia)   [place] Primary Care & Sports Medicine at [facility], Chales [place], MD   9 months ago PHN (postherpetic neuralgia)   [place] Primary Care & Sports Medicine at [facility], Chales [place], MD   9 months ago Herpes zoster without complication   [place] Primary Care & Sports Medicine at [facility], Chales [place], MD       Future Appointments             In 2 months Gala Jubilee [place], MD [facility] Health Primary Care & Sports Medicine at [facility], [facility]   In 2 months Sheron Dixons, MD [facility] Health Primary Care & Sports Medicine at [facility], [facility]             losartan  (COZAAR ) 100 MG tablet [Pharmacy Med Name: LOSARTAN  POT TAB 100MG ] 90 tablet 1    Sig: TAKE 1/2 TABLET (50MG       TOTAL) TWO TIMES A DAY     Cardiovascular:  Angiotensin Receptor Blockers Passed - 02/16/2023  3:36 PM      Passed - Cr in normal range and within 180 days    Creatinine, Ser  Date Value Ref Range Status  01/06/2023 1.27 0.76 - 1.27 mg/dL Final         Passed - K in normal range and within 180 days  Potassium  Date Value Ref Range Status  01/06/2023 4.4 3.5 - 5.2 mmol/L Final  01/28/2011 4.2 3.5 - 5.1 mmol/L Final    Comment:    POTASSIUM -  Slight hemolysis, interpret results with  - caution.          Passed - Patient is not pregnant      Passed - Last BP in normal range    BP Readings from Last 1 Encounters:  01/06/23 116/74         Passed - Valid encounter within last 6 months    Recent Outpatient Visits           1 month ago Type II diabetes mellitus with complication [facility])   [place] Primary Care & Sports Medicine at [facility], Chales [place], MD   6 months ago Essential (primary) hypertension   [place] Primary Care & Sports Medicine at [facility], Chales [place], MD   9 months ago PHN (postherpetic neuralgia)   [place] Primary Care & Sports Medicine at [facility], Chales [place], MD   9 months ago PHN (postherpetic neuralgia)   [place] Primary Care & Sports Medicine at [facility], Chales [place], MD   9 months ago Herpes zoster without complication   [place] Primary Care & Sports Medicine at [facility], Chales [place], MD       Future Appointments             In 2 months Sheron Dixons, MD [facility] Health Primary Care & Sports Medicine at [facility], [facility]   In 2 months Sheron Dixons, MD [facility] Health Primary Care & Sports Medicine at [facility], [facility]             rosuvastatin  (CRESTOR ) 20 MG tablet [Pharmacy Med Name: ROSUVASTATIN  TAB 20MG ] 90 tablet 0    Sig: TAKE 1 TABLET AT BEDTIME     Cardiovascular:  Antilipid - Statins 2 Failed - 02/16/2023  3:36 PM      Failed - Lipid Panel in normal range within the last 12 months    Cholesterol, Total  Date Value Ref Range Status  04/09/2022 138 100 - 199 mg/dL Final   LDL Chol Calc (NIH)  Date Value Ref Range Status  04/09/2022 76 0 - 99 mg/dL Final   HDL  Date Value Ref Range Status  04/09/2022 40 >39 mg/dL Final   Triglycerides  Date Value Ref Range Status  04/09/2022 119 0 - 149 mg/dL Final         Passed - Cr in normal range and within 360 days    Creatinine, Ser   Date Value Ref Range Status  01/06/2023 1.27 0.76 - 1.27 mg/dL Final         Passed - Patient is not pregnant      Passed - Valid encounter within last 12 months    Recent Outpatient Visits           1 month ago Type II diabetes mellitus with complication [facility])   [place] Primary Care & Sports Medicine at [facility], Chales [place], MD   6 months ago Essential (primary) hypertension   [place] [facility] Care & Sports Medicine at [facility], Chales [place], MD   9 months ago PHN (postherpetic neuralgia)   [place] Primary Care & Sports Medicine at [facility], Chales [place], MD   9 months ago PHN (postherpetic neuralgia)   [place] Primary  Care & Sports Medicine at [facility], Chales [place], MD   9 months ago Herpes zoster without complication   [place] Primary Care & Sports Medicine at [facility], Chales [place], MD       Future Appointments             In 2 months Gala Jubilee Ch[place], MD [facility] Health Primary Care & Sports [facility] at [facility], [facility]   In 2 months Gala Jubilee [place], MD [facility] Primary Care & Sports Medicine at [facility], [facility]             finasteride  (PROSCAR ) 5 MG tablet [Pharmacy Med Name: FINASTERIDE   TAB 5MG ] 90 tablet 1    Sig: TAKE 1 TABLET DAILY     Urology: 5-alpha Reductase Inhibitors Failed - 02/16/2023  3:36 PM      Failed - PSA in normal range and within 360 days    PSA  Date Value Ref Range Status  04/10/2017 1.83  Final   Prostate Specific Ag, Serum  Date Value Ref Range Status  02/15/2020 0.7 0.0 - 4.0 ng/mL Final    Comment:    Roche ECLIA methodology. According to the American Urological Association, Serum PSA should decrease and remain at undetectable levels after radical prostatectomy. The AUA defines biochemical recurrence as an initial PSA value 0.2 ng/mL or greater followed by a subsequent confirmatory PSA value 0.2 ng/mL or greater. Values obtained with  different assay methods or kits cannot be used interchangeably. Results cannot be interpreted as absolute evidence of the presence or absence of malignant disease.          Passed - Valid encounter within last 12 months    Recent Outpatient Visits           1 month ago Type II diabetes mellitus with complication [facility])   [place] Primary Care & Sports Medicine at [facility], Chales [place], MD   6 months ago Essential (primary) hypertension   [place] Primary Care & Sports Medicine at [facility], Chales [place], MD   9 months ago PHN (postherpetic neuralgia)   [place] Primary Care & Sports Medicine at [facility], Chales [place], MD   9 months ago PHN (postherpetic neuralgia)   [place] Primary Care & Sports Medicine at [facility], Chales [place], MD   9 months ago Herpes zoster without complication    Primary Care & Sports Medicine at [facility], Chales [place], MD       Future Appointments             In 2 months Gala Jubilee [place], MD [facility] Health Primary Care & Sports Medicine at [facility], [facility]   In 2 months Sheron Dixons, MD [facility] Primary Care & Sports Medicine at [facility], [facility]             metFORMIN  (GLUCOPHAGE -XR) 500 MG 24 hr tablet [Pharmacy Med Name: METFORMIN  ER TAB 500MG  GP] 180 tablet 1    Sig: TAKE 1 TABLET TWICE DAILY  WITH MEALS     Endocrinology:  Diabetes - Biguanides Failed - 02/16/2023  3:36 PM      Failed - eGFR in normal range and within 360 days    GFR calc Af Amer  Date Value Ref Range Status  05/19/2019 55 (L) >59 mL/min/1.73 Final    Comment:    **[facility] currently reports eGFR in compliance with the current**   recommendations of  the [employer]. [facility] will   update reporting as new guidelines are published from the NKF-ASN   Task force.    GFR, Estimated  Date Value Ref Range Status  06/30/2022 55 (L) >60 mL/min Final    Comment:     (NOTE) Calculated using the CKD-EPI Creatinine Equation (2021)    eGFR  Date Value Ref Range Status  01/06/2023 55 (L) >59 mL/min/1.73 Final         Failed - B12 Level in normal range and within 720 days    Vitamin B-12  Date Value Ref Range Status  02/28/2021 1,594 (H) 232 - 1,245 pg/mL Final         Passed - Cr in normal range and within 360 days    Creatinine, Ser  Date Value Ref Range Status  01/06/2023 1.27 0.76 - 1.27 mg/dL Final         Passed - HBA1C is between 0 and 7.9 and within 180 days    Hgb A1c MFr Bld  Date Value Ref Range Status  01/06/2023 6.6 (H) 4.8 - 5.6 % Final    Comment:             Prediabetes: 5.7 - 6.4          Diabetes: >6.4          Glycemic control for adults with diabetes: <7.0          Passed - Valid encounter within last 6 months    Recent Outpatient Visits           1 month ago Type II diabetes mellitus with complication [facility])   [place] Primary Care & Sports Medicine at [facility], Chales [place], MD   6 months ago Essential (primary) hypertension   [place] Primary Care & Sports Medicine at [facility], Chales [place], MD   9 months ago PHN (postherpetic neuralgia)   [place] Primary Care & Sports Medicine at [facility], Chales [place], MD   9 months ago PHN (postherpetic neuralgia)   [place] [facility] Care & Sports Medicine at [facility], Chales [place], MD   9 months ago Herpes zoster without complication   [place] Primary Care & Sports Medicine at [facility], Chales [place], MD       Future Appointments             In 2 months Gala Jubilee [place], MD [facility] Health Primary Care & Sports Medicine at [facility], [facility]   In 2 months Sheron Dixons, MD [facility] Primary Care & Sports Medicine at [facility], [facility]            Passed - CBC within normal limits and completed in the last 12 months    WBC  Date Value Ref Range Status  06/30/2022 6.9 4.0 - 10.5 K/uL  Final   RBC  Date Value Ref Range Status  06/30/2022 4.00 (L) 4.22 - 5.81 MIL/uL Final   Hemoglobin  Date Value Ref Range Status  06/30/2022 13.3 13.0 - 17.0 g/dL Final  09/81/1914 78.2 13.0 - 17.7 g/dL Final   HCT  Date Value Ref Range Status  06/30/2022 38.8 (L) 39.0 - 52.0 % Final   Hematocrit  Date Value Ref Range Status  04/09/2022 39.4 37.5 - 51.0 % Final   MCHC  Date Value Ref Range Status  06/30/2022 34.3 30.0 - 36.0 g/dL Final   [facility]  Date Value Ref Range Status  06/30/2022 33.3 26.0 - 34.0 pg Final   MCV  Date Value Ref Range Status  06/30/2022 97.0 80.0 - 100.0 fL Final  04/09/2022 94 79 - 97 fL Final  01/28/2011 96 80 - 100 fL Final   No results found for: "PLTCOUNTKUC", "LABPLAT", "POCPLA" RDW  Date Value Ref Range Status  06/30/2022 13.4 11.5 - 15.5 % Final  04/09/2022 13.5 11.6 - 15.4 % Final  01/28/2011 13.3 11.5 - 14.5 % Final

## 2023-02-18 ENCOUNTER — Other Ambulatory Visit: Payer: Self-pay | Admitting: Internal Medicine

## 2023-02-18 DIAGNOSIS — E118 Type 2 diabetes mellitus with unspecified complications: Secondary | ICD-10-CM

## 2023-02-18 NOTE — Telephone Encounter (Signed)
Medication Refill -  Most Recent Primary Care Visit:  Provider: Hal Hope  Department: [facility]  Visit Type: MEDICARE WELL VISIT 45  Date: 02/04/2023  Medication: sitaGLIPtin (JANUVIA) 100 MG tablet  Pt's last prescription expired, needing new one sent in.  Has the patient contacted their pharmacy? Yes Told to contact the office.  Is this the correct pharmacy for this prescription? Yes This is the patient's preferred pharmacy: [facility] [place], [place] - [address] Portal to Registered Caremark Sites [address] [place] [address] Phone: [phone] Fax: [phone]   Has the prescription been filled recently? No  Is the patient out of the medication? No  Has the patient been seen for an appointment in the last year OR does the patient have an upcoming appointment? Yes  Can we respond through MyChart? Yes  Agent: Please be advised that Rx refills may take up to 3 business days. We ask that you follow-up with your pharmacy.

## 2023-02-19 MED ORDER — SITAGLIPTIN PHOSPHATE 100 MG PO TABS
100.0000 mg | ORAL_TABLET | Freq: Every day | ORAL | 1 refills | Status: AC
Start: 2023-02-19 — End: ?

## 2023-02-19 NOTE — Telephone Encounter (Signed)
Requested Prescriptions  Pending Prescriptions Disp Refills   sitaGLIPtin (JANUVIA) 100 MG tablet 90 tablet 1    Sig: Take 1 tablet (100 mg total) by mouth daily.     Endocrinology:  Diabetes - DPP-4 Inhibitors Passed - 02/19/2023  8:25 AM      Passed - HBA1C is between 0 and 7.9 and within 180 days    Hgb A1c MFr Bld  Date Value Ref Range Status  01/06/2023 6.6 (H) 4.8 - 5.6 % Final    Comment:             Prediabetes: 5.7 - 6.4          Diabetes: >6.4          Glycemic control for adults with diabetes: <7.0          Passed - Cr in normal range and within 360 days    Creatinine, Ser  Date Value Ref Range Status  01/06/2023 1.27 0.76 - 1.27 mg/dL Final         Passed - Valid encounter within last 6 months    Recent Outpatient Visits           1 month ago Type II diabetes mellitus with complication [facility])   [place] Primary Care & Sports Medicine at [facility], Nyoka Cowden, MD   6 months ago Essential (primary) hypertension   [place] Primary Care & Sports Medicine at [facility], Nyoka Cowden, MD   9 months ago PHN (postherpetic neuralgia)   [place] Primary Care & Sports Medicine at [facility], Nyoka Cowden, MD   9 months ago PHN (postherpetic neuralgia)   [place] Primary Care & Sports Medicine at [facility], Nyoka Cowden, MD   9 months ago Herpes zoster without complication   [facility] Health Primary Care & Sports Medicine at [facility], Nyoka Cowden, MD       Future Appointments             In 2 months Judithann Graves, Nyoka Cowden, MD [facility] Health Primary Care & Sports Medicine at [facility], [facility]   In 2 months Judithann Graves, Nyoka Cowden, MD [facility] Health Primary Care & Sports Medicine at [facility], [facility]

## 2023-03-17 DIAGNOSIS — C44619 Basal cell carcinoma of skin of left upper limb, including shoulder: Secondary | ICD-10-CM | POA: Diagnosis not present

## 2023-04-10 ENCOUNTER — Encounter: Payer: Self-pay | Admitting: Emergency Medicine

## 2023-04-10 ENCOUNTER — Ambulatory Visit
Admission: EM | Admit: 2023-04-10 | Discharge: 2023-04-10 | Disposition: A | Discharge status: Home / Self Care | Attending: Family Medicine | Admitting: Family Medicine

## 2023-04-10 ENCOUNTER — Telehealth: Payer: Self-pay | Admitting: Nurse Practitioner

## 2023-04-10 DIAGNOSIS — U071 COVID-19: Secondary | ICD-10-CM

## 2023-04-10 LAB — RESP PANEL BY RT-PCR (RSV, FLU A&B, COVID)  RVPGX2
Influenza A by PCR: NEGATIVE
Influenza B by PCR: NEGATIVE
Resp Syncytial Virus by PCR: NEGATIVE
SARS Coronavirus 2 by RT PCR: POSITIVE — AB

## 2023-04-10 MED ORDER — MOLNUPIRAVIR EUA 200MG CAPSULE: 4.0000 | ORAL_CAPSULE | Freq: Two times a day (BID) | ORAL | 0 refills | Status: DC

## 2023-04-10 MED ORDER — PAXLOVID (150/100) 10 X 150 MG & 10 X 100MG PO TBPK
2.0000 | ORAL_TABLET | Freq: Two times a day (BID) | ORAL | 0 refills | Status: AC
Start: 2023-04-10 — End: 2023-04-15

## 2023-04-10 NOTE — ED Triage Notes (Signed)
 Patient c/o cough, congestion and nasal congestion for 2 days.  Patient denies fevers.

## 2023-04-10 NOTE — ED Provider Notes (Addendum)
 [facility]    CSN: [id number] Arrival date & time: 04/10/23  0831      History   Chief Complaint Chief Complaint  Patient presents with   Cough    HPI Maxwell Stanley is a 89 y.o. male  presents for evaluation of URI symptoms for 2 days. Patient reports associated symptoms of cough, congestion, decreased appetite. Denies N/V/D, fevers, ear pain, sore throat, body aches, shortness of breath. Patient does not have a hx of asthma. Patient is not an active smoker.   Reports no sick contacts.  Pt has taken Tylenol OTC for symptoms. Pt has no other concerns at this time.    Cough   Past Medical History:  Diagnosis Date   A-fib [facility])    a.) CHA2DS2VASc = 5 (age x2, HTN, vascular disease history, T2DM);  b.) s/p Watchman 08/21/2021; c.) rate/rhythm maintained on oral atenolol; chronically anticoagulated with reduced dose apixaban with plans to transition to DAPT (ASA + clopidogrel) x 4 months following THA on 10/21/2021   Allergy    Aortic atherosclerosis (HCC)    Basal cell carcinoma 11/28/2019   R neck post auricular, EDC  NODULAR AND INFILTRATIVE PATTERNS   Basal cell carcinoma 12/21/2019   R lateral calf, treated wiht EDC 01/26/2020   BCC (basal cell carcinoma) 06/19/2020   left distal calf, EDC 08/09/2020   Bilateral inguinal hernia    a.) s/p repair   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    Carpal tunnel syndrome on left    Cataract 11/2018   Chronic back pain    CKD stage 3 secondary to diabetes (HCC)    DDD (degenerative disc disease), lumbar    Diastolic dysfunction 11/19/2020   a.) TTE 11/19/2020: EF >55%, mild LVH, mod BAE, mild RVE, triv AR/PR, mod MR/TR, G1DD   Elevated PSA    GERD (gastroesophageal reflux disease)    History of basal cell carcinoma (BCC) 08/21/2020   right chest pectoral tx'd with Moh's 08/21/2020   History of squamous cell carcinoma of skin 2021   left ear/Moh's   Hyperlipidemia    Hypertension    IDA (iron  deficiency anemia)    Leg varices    Lumbar spinal stenosis    Nocturia    Organic impotence    Osteoarthritis    PONV (postoperative nausea and vomiting)    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   left proximal calf, EDC 08/09/2020   Squamous cell carcinoma of skin 11/28/2019   L ear sup helix  WELL DIFFERENTIATED   T2DM (type 2 diabetes mellitus) (HCC)    Thrombophilia (HCC)    Type 2 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)    Umbilical hernia    a.) s/p repair    Patient Active Problem List   Diagnosis Date Noted   Status post total hip replacement, left 07/14/2022   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   Hx of total hip arthroplasty, right 10/21/2021   Bilateral carotid artery stenosis 05/22/2021   Anemia 02/28/2021   Total knee replacement status 04/16/2020   Primary osteoarthritis of both knees 05/19/2019   Gastroesophageal reflux disease with esophagitis 02/02/2019   Personal history of other malignant neoplasm of skin 12/17/2018   History of atrial fibrillation 10/29/2018   Reducible right inguinal hernia 09/16/2018   DDD (degenerative disc disease), lumbar 12/23/2017   Onychomycosis of toenail 08/19/2017  Allergic rhinitis 08/08/2016   Carpal tunnel syndrome on left 12/12/2015   Chronic maxillary sinusitis 04/09/2015   Type II diabetes mellitus with complication (HCC) 11/28/2014   Chronic low back pain 09/21/2014   Abnormal prostate specific antigen 04/17/2014   Enlarged prostate with lower urinary tract symptoms (LUTS) 04/17/2014   CKD stage 3 secondary to diabetes (HCC) 04/17/2014   Hyperlipidemia associated with type 2 diabetes mellitus (HCC) 04/17/2014   ED (erectile dysfunction) of organic origin 04/17/2014   Essential (primary) hypertension 04/17/2014   Back muscle spasm 04/17/2014   Degenerative arthritis of finger 04/17/2014   Breast  development in males 04/17/2014   Leg varices 04/17/2014    Past Surgical History:  Procedure Laterality Date   ANKLE ARTHROSCOPY WITH OPEN REDUCTION INTERNAL FIXATION (ORIF) Left 1998   COLONOSCOPY     COLONOSCOPY WITH PROPOFOL N/A 05/28/2021   Procedure: COLONOSCOPY WITH PROPOFOL;  Surgeon: Midge Minium, MD;  Location: [facility] ENDOSCOPY;  Service: Endoscopy;  Laterality: N/A;   ESOPHAGOGASTRODUODENOSCOPY (EGD) WITH PROPOFOL N/A 05/28/2021   Procedure: ESOPHAGOGASTRODUODENOSCOPY (EGD) WITH PROPOFOL;  Surgeon: Midge Minium, MD;  Location: [facility] ENDOSCOPY;  Service: Endoscopy;  Laterality: N/A;   ETHMOIDECTOMY Left 03/08/2020   Procedure: TOTAL ETHMOIDECTOMY;  Surgeon: Vernie Murders, MD;  Location: [facility] SURGERY CNTR;  Service: ENT;  Laterality: Left;   FRONTAL SINUS EXPLORATION Left 03/08/2020   Procedure: FRONTAL SINUS EXPLORATION;  Surgeon: Vernie Murders, MD;  Location: [facility] SURGERY CNTR;  Service: ENT;  Laterality: Left;   GIVENS CAPSULE STUDY N/A 07/11/2021   Procedure: GIVENS CAPSULE STUDY;  Surgeon: Midge Minium, MD;  Location: [facility] ENDOSCOPY;  Service: Endoscopy;  Laterality: N/A;   HEMORRHOID SURGERY     IMAGE GUIDED SINUS SURGERY N/A 03/08/2020   Procedure: IMAGE GUIDED SINUS SURGERY;  Surgeon: Vernie Murders, MD;  Location: [facility] SURGERY CNTR;  Service: ENT;  Laterality: N/A;  placed disk on OR charge nurse desk 2-21 kp PLACED 2ND DISK ON OR CHARGE NURSE DESK 02-28-20 KP 3RD DISK ON OR CHARGE NURSE DESK 2-25- KP   INGUINAL HERNIA REPAIR Right 11/12/2018   Procedure: HERNIA REPAIR INGUINAL ADULT;  Surgeon: Duanne Guess, MD;  Location: [facility] ORS;  Service: General;  Laterality: Right;   INGUINAL HERNIA REPAIR Left 2012   JOINT REPLACEMENT     KNEE ARTHROPLASTY Right 04/16/2020   Procedure: COMPUTER ASSISTED TOTAL KNEE ARTHROPLASTY;  Surgeon: Donato Heinz, MD;  Location: [facility] ORS;  Service: Orthopedics;  Laterality: Right;   KNEE ARTHROSCOPY Right 2006   MAXILLARY ANTROSTOMY  Left 03/08/2020   Procedure: MAXILLARY ANTROSTOMY WITH TISSUE REMOVAL;  Surgeon: Vernie Murders, MD;  Location: [facility] SURGERY [facility];  Service: ENT;  Laterality: Left;   SEPTOPLASTY N/A 03/08/2020   Procedure: SEPTOPLASTY;  Surgeon: Vernie Murders, MD;  Location: [facility] SURGERY CNTR;  Service: ENT;  Laterality: N/A;   skin cancer resection Right 04/07/2018   ON hand.   THUMB ARTHROSCOPY Right 2013   tendon flap to joint   TONSILLECTOMY     TOTAL HIP ARTHROPLASTY Right 10/21/2021   Procedure: TOTAL HIP ARTHROPLASTY;  Surgeon: Donato Heinz, MD;  Location: [facility] ORS;  Service: Orthopedics;  Laterality: Right;   TOTAL HIP ARTHROPLASTY Left 07/14/2022   Procedure: TOTAL HIP ARTHROPLASTY;  Surgeon: Donato Heinz, MD;  Location: [facility] ORS;  Service: Orthopedics;  Laterality: Left;   UMBILICAL HERNIA REPAIR Left 2012   UPPER GI ENDOSCOPY         Home Medications    Prior to Admission medications   Medication  Sig Start Date End Date Taking? Authorizing Provider  acetaminophen (TYLENOL) 500 MG tablet Take 1,000 mg by mouth 2 (two) times daily.    [provider]  amLODipine (NORVASC) 2.5 MG tablet TAKE 1 TABLET DAILY 02/16/23   Reubin Milan, MD  aspirin EC 81 MG tablet Take 1 tablet (81 mg total) by mouth in the morning and at bedtime. Swallow whole. 07/15/22   Rayburn Go, PA-C  atenolol (TENORMIN) 50 MG tablet TAKE 1 TABLET AT BEDTIME 02/16/23   Reubin Milan, MD  celecoxib (CELEBREX) 200 MG capsule Take 1 capsule (200 mg total) by mouth 2 (two) times daily. Patient not taking: Reported on 02/04/2023 07/15/22   Rayburn Go, PA-C  cholecalciferol (VITAMIN D) 1000 UNITS tablet Take 1,000 Units by mouth daily.    [provider]  docusate sodium (COLACE) 100 MG capsule Take 200 mg by mouth daily.     [provider]  ferrous sulfate 325 (65 FE) MG EC tablet Take 1 tablet by mouth daily.    [provider]  finasteride (PROSCAR) 5 MG tablet TAKE 1  TABLET DAILY 02/16/23   Reubin Milan, MD  fluticasone [facility]) 50 MCG/ACT nasal spray USE 2 SPRAYS IN EACH       NOSTRIL DAILY Patient taking differently: Place 2 sprays into both nostrils daily as needed for allergies or rhinitis. 06/04/22   Reubin Milan, MD  glucose blood [facility] ULTRA) test strip Use as instructed 02/19/21   Reubin Milan, MD  lidocaine (LMX) 4 % cream Apply 1 Application topically as needed.    [provider]  losartan (COZAAR) 100 MG tablet TAKE 1/2 TABLET (50MG       TOTAL) TWO TIMES A DAY 02/16/23   Reubin Milan, MD  Melatonin 10 MG CAPS Take 10 mg by mouth at bedtime.    [provider]  metFORMIN (GLUCOPHAGE-XR) 500 MG 24 hr tablet TAKE 1 TABLET TWICE DAILY  WITH MEALS 02/16/23   Reubin Milan, MD  Misc Natural Products (OSTEO BI-FLEX TRIPLE STRENGTH PO) Take 1 tablet by mouth daily. Patient not taking: Reported on 02/04/2023    [provider]  Multiple Vitamin (MULTIVITAMIN WITH MINERALS) TABS tablet Take 1 tablet by mouth daily.    [provider]  nirmatrelvir/ritonavir, renal dosing, (PAXLOVID, 150/100,) 10 x 150 MG & 10 x 100MG  TBPK Take 2 tablets by mouth 2 (two) times daily for 5 days. Dosage for moderate renal impairment (eGFR >/= 30 to <60 mL/min): 150 mg nirmatrelvir (one 150 mg tablet) with 100 mg ritonavir (one 100 mg tablet), with both tablets taken together twice daily for 5 days. Not recommended if eGFR < 30 mL/min. PAXLOVID is not recommend in patients with severe hepatic impairment (Child-Pugh Class C). 04/10/23 04/15/23  Radford Pax, NP  OneTouch Delica Lancets 33G MISC 1 each by Does not apply route daily. Use to test blood sugar once daily. 02/02/19   Reubin Milan, MD  pantoprazole (PROTONIX) 40 MG tablet TAKE 1 TABLET TWICE A DAY 12/09/22   Reubin Milan, MD  Probiotic Product (PROBIOTIC DAILY PO) Take 1 tablet by mouth daily.    [provider]  rosuvastatin (CRESTOR) 20 MG tablet  TAKE 1 TABLET AT BEDTIME 02/16/23   Reubin Milan, MD  scopolamine (TRANSDERM SCOP, 1.5 MG,) 1 MG/3DAYS Place 1 patch (1.5 mg total) onto the skin every 3 (three) days. 08/19/22   Reubin Milan, MD  sitaGLIPtin (JANUVIA) 100 MG  tablet Take 1 tablet (100 mg total) by mouth daily. 02/19/23   Reubin Milan, MD  tamsulosin [facility]) 0.4 MG CAPS capsule TAKE 1 CAPSULE DAILY AFTER SUPPER 02/16/23   Reubin Milan, MD    Family History Family History  Problem Relation Age of Onset   Diabetes Mother    Coronary artery disease Father    Hypertension Father    Hyperlipidemia Father    Leukemia Father     Social History Social History   Tobacco Use   Smoking status: Never   Smokeless tobacco: Never  Vaping Use   Vaping status: Never Used  Substance Use Topics   Alcohol use: No    Alcohol/week: 0.0 standard drinks of alcohol   Drug use: No     Allergies   Codeine, Ceftriaxone, and Penicillin g   Review of Systems Review of Systems  Constitutional:  Positive for appetite change.  HENT:  Positive for congestion.   Respiratory:  Positive for cough.      Physical Exam Triage Vital Signs ED Triage Vitals  Encounter Vitals Group     BP 04/10/23 0833 (!) 151/89     Systolic BP Percentile --      Diastolic BP Percentile --      Pulse Rate 04/10/23 0833 82     Resp 04/10/23 0833 15     Temp 04/10/23 0833 98.5 F (36.9 C)     Temp Source 04/10/23 0833 Oral     SpO2 04/10/23 0833 95 %     Weight 04/10/23 0832 177 lb 14.6 oz (80.7 kg)     Height 04/10/23 0832 6\' 4"  (1.93 m)     Head Circumference --      Peak Flow --      Pain Score 04/10/23 0832 0     Pain Loc --      Pain Education --      Exclude from Growth Chart --    No data found.  Updated Vital Signs BP (!) 151/89 (BP Location: Right Arm)   Pulse 82   Temp 98.5 F (36.9 C) (Oral)   Resp 15   Ht 6\' 4"  (1.93 m)   Wt 177 lb 14.6 oz (80.7 kg)   SpO2 95%   BMI 21.66 kg/m   Visual Acuity Right Eye  Distance:   Left Eye Distance:   Bilateral Distance:    Right Eye Near:   Left Eye Near:    Bilateral Near:     Physical Exam Vitals and nursing note reviewed.  Constitutional:      General: He is not in acute distress.    Appearance: Normal appearance. He is not ill-appearing or toxic-appearing.  HENT:     Head: Normocephalic and atraumatic.     Right Ear: Tympanic membrane and ear canal normal.     Left Ear: Tympanic membrane and ear canal normal.     Nose: Congestion present.     Mouth/Throat:     Mouth: Mucous membranes are moist.     Pharynx: No oropharyngeal exudate or posterior oropharyngeal erythema.  Eyes:     Pupils: Pupils are equal, round, and reactive to light.  Cardiovascular:     Rate and Rhythm: Normal rate and regular rhythm.     Heart sounds: Normal heart sounds.  Pulmonary:     Effort: Pulmonary effort is normal.     Breath sounds: Normal breath sounds. No wheezing, rhonchi or rales.  Musculoskeletal:     Cervical back:  Normal range of motion and neck supple.  Lymphadenopathy:     Cervical: No cervical adenopathy.  Skin:    General: Skin is warm and dry.  Neurological:     General: No focal deficit present.     Mental Status: He is alert and oriented to person, place, and time.  Psychiatric:        Mood and Affect: Mood normal.        Behavior: Behavior normal.      UC Treatments / Results  Labs (all labs ordered are listed, but only abnormal results are displayed) Labs Reviewed  RESP PANEL BY RT-PCR (RSV, FLU A&B, COVID)  [id number] - Abnormal; Notable for the following components:      Result Value   SARS Coronavirus 2 by RT PCR POSITIVE (*)    All other components within normal limits    Basic metabolic panel Order: [id number]  Status: Final result     Next appt: 05/06/2023 at 08:00 AM in Internal Medicine Bari Edward, MD)     Dx: Type II diabetes mellitus with compli...   Test Result Released: Yes (seen)     Messages: Seen   2  Result Notes     1 Patient Communication          Component Ref Range & Units (hover) 3 mo ago (01/06/23) 9 mo ago (06/30/22) 1 yr ago (04/09/22) 1 yr ago (10/07/21) 1 yr ago (06/20/21) 2 yr ago (02/19/21) 2 yr ago (10/09/20)  Glucose 147 High  153 High  CM 145 High  126 High  159 High  153 High  128 High  CM  BUN 19 23 R 19 21 16 24 26   Creatinine, Ser 1.27 1.28 High  R 1.32 High  1.33 High  1.39 High  1.56 High  1.59 High   eGFR 55 Low   53 Low  53 Low  50 Low  44 Low  43 Low   BUN/Creatinine Ratio 15  14 16 12 15 16   Sodium 142 137 R 140 138 141 139 136  Potassium 4.4 4.4 R 4.3 4.8 4.7 4.7 4.7  Chloride 105 105 R 102 104 105 103 100  CO2 22 24 R 23 21 21 22 21   Calcium 9.6 9.4 R 9.7 9.2 10.0 9.8 9.7  Resulting Agency [facility]         Narrative Performed by: Verdell Carmine Performed at:  [address] [facility] [address] [place], [place]  [id number] Lab Director: Jolene Schimke MD, Phone:  [phone]  Specimen Collected: 01/06/23 09:29 Last Resulted: 01/07/23 05:35    View All Conversations on this Encounter       EKG   Radiology No results found.  Procedures Procedures (including critical care time)  Medications Ordered in UC Medications - No data to display  Initial Impression / Assessment and Plan / UC Course  I have reviewed the triage vital signs and the nursing notes.  Pertinent labs & imaging results that were available during my care of the patient were reviewed by me and considered in my medical decision making (see chart for details).     Reviewed exam and symptoms with patient.  No red flags.  Positive COVID PCR.  Given patient medical history, medications and kidney function we will start molnupiravir, side effect profile reviewed.  Discussed symptom management, fluids and rest.  States he will continue OTC cough medicine is not working well for him.  Advised PCP  follow-up in 2 to 3 days for recheck.   Strict ER precautions reviewed and patient verbalized understanding.    Addendum 1437: Patient was seen this morning and tested positive for COVID.  Due to his medication list he was started on molnupiravir.  Pharmacy contacted clinic stating it was not covered by insurance and requested Rx for Paxlovid.  I contacted the patient and verified DOB.  I reviewed this information with the patient I also reviewed his home medication list.  GFR is 55.  Liver function normal.  He would like to start Paxlovid.  Side effect profile was reviewed.  After going over his medication/interactions he was informed the following for medication adjustments while on Paxlovid:  Stop amlodipine while on Paxlovid and may resume once completed Stop Crestor while on Paxlovid and may resume once completed Stop Flomax while on Paxlovid as well as for 3 days afterwards (total of 8 days)  Patient verbalized understanding of these changes.  His MyChart AVS instructions were also updated to reflect this. Final Clinical Impressions(s) / UC Diagnoses   Final diagnoses:  COVID-19     Discharge Instructions      You have tested positive for COVID-19.  Paxlovid has been sent to your pharmacy.  This medication interacts with a lot of your medications so please make the following medication changes while you are on Paxlovid.   Stop amlodipine while on Paxlovid and may resume once completed Stop Crestor while on Paxlovid and may resume once completed Stop Flomax while on Paxlovid as well as for 3 days after completion (total of 8 days)   You may continue over-the-counter cough medicine as needed.  Lots of rest and fluids.  Please follow-up with your PCP in 2 to 3 days for recheck.  Please go to the ER for any worsening symptoms.  Hope you feel better soon!     ED Prescriptions     Medication Sig Dispense Auth. Provider   molnupiravir EUA (LAGEVRIO) 200 mg CAPS capsule Take 4 capsules (800 mg total) by mouth 2 (two)  times daily for 5 days. 40 capsule Radford Pax, NP      PDMP not reviewed this encounter.   Radford Pax, NP 04/10/23 0935    Radford Pax, NP 04/10/23 0935    Radford Pax, NP 04/10/23 1437    Radford Pax, NP 04/10/23 [phone]

## 2023-04-10 NOTE — Telephone Encounter (Signed)
 Patient was seen this morning and tested positive for COVID.  Due to his medication list he was started on molnupiravir.  Pharmacy contacted clinic stating it was not covered by insurance and requested Rx for Paxlovid.  I contacted the patient and verified DOB.  I reviewed this information with the patient I also reviewed his home medication list.  GFR is 55.  Liver function normal.  He would like to start Paxlovid.  Side effect profile was reviewed.  After going over his medication/interactions he was informed the following for medication adjustments while on Paxlovid:  Stop amlodipine while on Paxlovid and may resume once completed Stop Crestor while on Paxlovid and may resume once completed Stop Flomax while on Paxlovid as well as for 3 days afterwards (total of 8 days)  Patient verbalized understanding of these changes.  His MyChart AVS instructions were also updated to reflect this.

## 2023-04-10 NOTE — Discharge Instructions (Addendum)
 You have tested positive for COVID-19.  Paxlovid has been sent to your pharmacy.  This medication interacts with a lot of your medications so please make the following medication changes while you are on Paxlovid.   Stop amlodipine while on Paxlovid and may resume once completed Stop Crestor while on Paxlovid and may resume once completed Stop Flomax while on Paxlovid as well as for 3 days after completion (total of 8 days)   You may continue over-the-counter cough medicine as needed.  Lots of rest and fluids.  Please follow-up with your PCP in 2 to 3 days for recheck.  Please go to the ER for any worsening symptoms.  Hope you feel better soon!

## 2023-04-15 ENCOUNTER — Ambulatory Visit
Admission: EM | Admit: 2023-04-15 | Discharge: 2023-04-15 | Disposition: A | Discharge status: Home / Self Care | Attending: Family Medicine | Admitting: Family Medicine

## 2023-04-15 ENCOUNTER — Ambulatory Visit (INDEPENDENT_AMBULATORY_CARE_PROVIDER_SITE_OTHER)

## 2023-04-15 DIAGNOSIS — U071 COVID-19: Secondary | ICD-10-CM | POA: Diagnosis not present

## 2023-04-15 DIAGNOSIS — R051 Acute cough: Secondary | ICD-10-CM

## 2023-04-15 MED ORDER — AZITHROMYCIN 250 MG PO TABS: ORAL_TABLET | ORAL | 0 refills | Status: AC

## 2023-04-15 NOTE — ED Triage Notes (Signed)
 Patient was dx with covid. Patient is having a productive cough with green mucus

## 2023-04-15 NOTE — ED Provider Notes (Signed)
 [facility]    CSN: [id number] Arrival date & time: 04/15/23  1140      History   Chief Complaint Chief Complaint  Patient presents with   Cough    HPI VILAS EDGERLY is a 89 y.o. male.   HPI  History obtained from  patient . Yunior presents for productive cough for the past week.  Has some chest discomfort with coughing. The cough isnt getting better. He is getting concerned that the cough is "hanging on."  He is concerned that he may have pneumonia. Has been coughing up green stuff.  No fever, vomiting. Has diarrhea. Finishes his COVID treatment tonight.       Past Medical History:  Diagnosis Date   A-fib [facility])    a.) CHA2DS2VASc = 5 (age x2, HTN, vascular disease history, T2DM);  b.) s/p Watchman 08/21/2021; c.) rate/rhythm maintained on oral atenolol; chronically anticoagulated with reduced dose apixaban with plans to transition to DAPT (ASA + clopidogrel) x 4 months following THA on 10/21/2021   Allergy    Aortic atherosclerosis (HCC)    Basal cell carcinoma 11/28/2019   R neck post auricular, EDC  NODULAR AND INFILTRATIVE PATTERNS   Basal cell carcinoma 12/21/2019   R lateral calf, treated wiht EDC 01/26/2020   BCC (basal cell carcinoma) 06/19/2020   left distal calf, EDC 08/09/2020   Bilateral inguinal hernia    a.) s/p repair   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    Carpal tunnel syndrome on left    Cataract 11/2018   Chronic back pain    CKD stage 3 secondary to diabetes (HCC)    DDD (degenerative disc disease), lumbar    Diastolic dysfunction 11/19/2020   a.) TTE 11/19/2020: EF >55%, mild LVH, mod BAE, mild RVE, triv AR/PR, mod MR/TR, G1DD   Elevated PSA    GERD (gastroesophageal reflux disease)    History of basal cell carcinoma (BCC) 08/21/2020   right chest pectoral tx'd with Moh's 08/21/2020   History of squamous cell carcinoma of skin 2021   left ear/Moh's   Hyperlipidemia    Hypertension    IDA (iron deficiency  anemia)    Leg varices    Lumbar spinal stenosis    Nocturia    Organic impotence    Osteoarthritis    PONV (postoperative nausea and vomiting)    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   left proximal calf, EDC 08/09/2020   Squamous cell carcinoma of skin 11/28/2019   L ear sup helix  WELL DIFFERENTIATED   T2DM (type 2 diabetes mellitus) (HCC)    Thrombophilia (HCC)    Type 2 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)    Umbilical hernia    a.) s/p repair    Patient Active Problem List   Diagnosis Date Noted   Status post total hip replacement, left 07/14/2022   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   Hx of total hip arthroplasty, right 10/21/2021   Bilateral carotid artery stenosis 05/22/2021   Anemia 02/28/2021   Total knee replacement status 04/16/2020   Primary osteoarthritis of both knees 05/19/2019   Gastroesophageal reflux disease with esophagitis 02/02/2019   Personal history of other malignant neoplasm of skin 12/17/2018   History of atrial fibrillation 10/29/2018   Reducible right inguinal hernia 09/16/2018   DDD (degenerative disc disease), lumbar 12/23/2017   Onychomycosis of toenail 08/19/2017  Allergic rhinitis 08/08/2016   Carpal tunnel syndrome on left 12/12/2015   Chronic maxillary sinusitis 04/09/2015   Type II diabetes mellitus with complication (HCC) 11/28/2014   Chronic low back pain 09/21/2014   Abnormal prostate specific antigen 04/17/2014   Enlarged prostate with lower urinary tract symptoms (LUTS) 04/17/2014   CKD stage 3 secondary to diabetes (HCC) 04/17/2014   Hyperlipidemia associated with type 2 diabetes mellitus (HCC) 04/17/2014   ED (erectile dysfunction) of organic origin 04/17/2014   Essential (primary) hypertension 04/17/2014   Back muscle spasm 04/17/2014   Degenerative arthritis of finger 04/17/2014   Breast development in males  04/17/2014   Leg varices 04/17/2014    Past Surgical History:  Procedure Laterality Date   ANKLE ARTHROSCOPY WITH OPEN REDUCTION INTERNAL FIXATION (ORIF) Left 1998   COLONOSCOPY     COLONOSCOPY WITH PROPOFOL N/A 05/28/2021   Procedure: COLONOSCOPY WITH PROPOFOL;  Surgeon: Midge Minium, MD;  Location[facility] ENDOSCOPY;  Service: Endoscopy;  Laterality: N/A;   ESOPHAGOGASTRODUODENOSCOPY (EGD) WITH PROPOFOL N/A 05/28/2021   Procedure: ESOPHAGOGASTRODUODENOSCOPY (EGD) WITH PROPOFOL;  Surgeon: Midge Minium, MD;  Location: [facility] ENDOSCOPY;  Service: Endoscopy;  Laterality: N/A;   ETHMOIDECTOMY Left 03/08/2020   Procedure: TOTAL ETHMOIDECTOMY;  Surgeon: Vernie Murders, MD;  Location: [facility] SURGERY CNTR;  Service: ENT;  Laterality: Left;   FRONTAL SINUS EXPLORATION Left 03/08/2020   Procedure: FRONTAL SINUS EXPLORATION;  Surgeon: Vernie Murders, MD;  Location: [facility] SURGERY CNTR;  Service: ENT;  Laterality: Left;   GIVENS CAPSULE STUDY N/A 07/11/2021   Procedure: GIVENS CAPSULE STUDY;  Surgeon: Midge Minium, MD;  Location: [facility] ENDOSCOPY;  Service: Endoscopy;  Laterality: N/A;   HEMORRHOID SURGERY     IMAGE GUIDED SINUS SURGERY N/A 03/08/2020   Procedure: IMAGE GUIDED SINUS SURGERY;  Surgeon: Vernie Murders, MD;  Location: [facility] SURGERY CNTR;  Service: ENT;  Laterality: N/A;  placed disk on OR charge nurse desk 2-21 kp PLACED 2ND DISK ON OR CHARGE NURSE DESK 02-28-20 KP 3RD DISK ON OR CHARGE NURSE DESK 2-25- KP   INGUINAL HERNIA REPAIR Right 11/12/2018   Procedure: HERNIA REPAIR INGUINAL ADULT;  Surgeon: Duanne Guess, MD;  Location: [facility] ORS;  Service: General;  Laterality: Right;   INGUINAL HERNIA REPAIR Left 2012   JOINT REPLACEMENT     KNEE ARTHROPLASTY Right 04/16/2020   Procedure: COMPUTER ASSISTED TOTAL KNEE ARTHROPLASTY;  Surgeon: Donato Heinz, MD;  Location: [facility] ORS;  Service: Orthopedics;  Laterality: Right;   KNEE ARTHROSCOPY Right 2006   MAXILLARY ANTROSTOMY Left 03/08/2020    Procedure: MAXILLARY ANTROSTOMY WITH TISSUE REMOVAL;  Surgeon: Vernie Murders, MD;  Location: [facility] SURGERY CNTR;  Service: ENT;  Laterality: Left;   SEPTOPLASTY N/A 03/08/2020   Procedure: SEPTOPLASTY;  Surgeon: Vernie Murders, MD;  Location: [facility] SURGERY CNTR;  Service: ENT;  Laterality: N/A;   skin cancer resection Right 04/07/2018   ON hand.   THUMB ARTHROSCOPY Right 2013   tendon flap to joint   TONSILLECTOMY     TOTAL HIP ARTHROPLASTY Right 10/21/2021   Procedure: TOTAL HIP ARTHROPLASTY;  Surgeon: Donato Heinz, MD;  Location: [facility] ORS;  Service: Orthopedics;  Laterality: Right;   TOTAL HIP ARTHROPLASTY Left 07/14/2022   Procedure: TOTAL HIP ARTHROPLASTY;  Surgeon: Donato Heinz, MD;  Location: [facility] ORS;  Service: Orthopedics;  Laterality: Left;   UMBILICAL HERNIA REPAIR Left 2012   UPPER GI ENDOSCOPY         Home Medications    Prior to Admission medications   Medication  Sig Start Date End Date Taking? Authorizing Provider  aspirin EC 81 MG tablet Take 1 tablet (81 mg total) by mouth in the morning and at bedtime. Swallow whole. 07/15/22  Yes Rayburn Go, PA-C  azithromycin (ZITHROMAX Z-PAK) 250 MG tablet Take 2 tablets on day 1 then 1 tablet daily 04/15/23  Yes Carles Florea, DO  cholecalciferol (VITAMIN D) 1000 UNITS tablet Take 1,000 Units by mouth daily.   Yes [provider]  ferrous sulfate 325 (65 FE) MG EC tablet Take 1 tablet by mouth daily.   Yes [provider]  finasteride (PROSCAR) 5 MG tablet TAKE 1 TABLET DAILY 02/16/23  Yes Reubin Milan, MD  metFORMIN (GLUCOPHAGE-XR) 500 MG 24 hr tablet TAKE 1 TABLET TWICE DAILY  WITH MEALS 02/16/23  Yes Reubin Milan, MD  Multiple Vitamin (MULTIVITAMIN WITH MINERALS) TABS tablet Take 1 tablet by mouth daily.   Yes [provider]  nirmatrelvir/ritonavir, renal dosing, (PAXLOVID, 150/100,) 10 x 150 MG & 10 x 100MG  TBPK Take 2 tablets by mouth 2 (two) times daily for 5 days. Dosage for  moderate renal impairment (eGFR >/= 30 to <60 mL/min): 150 mg nirmatrelvir (one 150 mg tablet) with 100 mg ritonavir (one 100 mg tablet), with both tablets taken together twice daily for 5 days. Not recommended if eGFR < 30 mL/min. PAXLOVID is not recommend in patients with severe hepatic impairment (Child-Pugh Class C). 04/10/23 04/15/23 Yes Radford Pax, NP  pantoprazole (PROTONIX) 40 MG tablet TAKE 1 TABLET TWICE A DAY 12/09/22  Yes Reubin Milan, MD  rosuvastatin (CRESTOR) 20 MG tablet TAKE 1 TABLET AT BEDTIME 02/16/23  Yes Reubin Milan, MD  sitaGLIPtin (JANUVIA) 100 MG tablet Take 1 tablet (100 mg total) by mouth daily. 02/19/23  Yes Reubin Milan, MD  tamsulosin [facility]) 0.4 MG CAPS capsule TAKE 1 CAPSULE DAILY AFTER SUPPER 02/16/23  Yes Reubin Milan, MD  acetaminophen (TYLENOL) 500 MG tablet Take 1,000 mg by mouth 2 (two) times daily.    [provider]  amLODipine (NORVASC) 2.5 MG tablet TAKE 1 TABLET DAILY 02/16/23   Reubin Milan, MD  atenolol (TENORMIN) 50 MG tablet TAKE 1 TABLET AT BEDTIME 02/16/23   Reubin Milan, MD  celecoxib (CELEBREX) 200 MG capsule Take 1 capsule (200 mg total) by mouth 2 (two) times daily. Patient not taking: Reported on 02/04/2023 07/15/22   Rayburn Go, PA-C  docusate sodium (COLACE) 100 MG capsule Take 200 mg by mouth daily.     [provider]  fluticasone (FLONASE) 50 MCG/ACT nasal spray USE 2 SPRAYS IN EACH       NOSTRIL DAILY Patient taking differently: Place 2 sprays into both nostrils daily as needed for allergies or rhinitis. 06/04/22   Reubin Milan, MD  glucose blood [facility] ULTRA) test strip Use as instructed 02/19/21   Reubin Milan, MD  lidocaine (LMX) 4 % cream Apply 1 Application topically as needed.    [provider]  losartan (COZAAR) 100 MG tablet TAKE 1/2 TABLET (50MG       TOTAL) TWO TIMES A DAY 02/16/23   Reubin Milan, MD  Melatonin 10 MG CAPS Take 10 mg by mouth at bedtime.     [provider]  Misc Natural Products (OSTEO BI-FLEX TRIPLE STRENGTH PO) Take 1 tablet by mouth daily. Patient not taking: Reported on 02/04/2023    [provider]  OneTouch Delica Lancets 33G MISC 1 each by Does not apply route daily.  Use to test blood sugar once daily. 02/02/19   Reubin Milan, MD  Probiotic Product (PROBIOTIC DAILY PO) Take 1 tablet by mouth daily.    [provider]  scopolamine (TRANSDERM SCOP, 1.5 MG,) 1 MG/3DAYS Place 1 patch (1.5 mg total) onto the skin every 3 (three) days. 08/19/22   Reubin Milan, MD    Family History Family History  Problem Relation Age of Onset   Diabetes Mother    Coronary artery disease Father    Hypertension Father    Hyperlipidemia Father    Leukemia Father     Social History Social History   Tobacco Use   Smoking status: Never   Smokeless tobacco: Never  Vaping Use   Vaping status: Never Used  Substance Use Topics   Alcohol use: No    Alcohol/week: 0.0 standard drinks of alcohol   Drug use: No     Allergies   Codeine, Ceftriaxone, and Penicillin g   Review of Systems Review of Systems: negative unless otherwise stated in HPI.      Physical Exam Triage Vital Signs ED Triage Vitals [04/15/23 1148]  Encounter Vitals Group     BP      Systolic BP Percentile      Diastolic BP Percentile      Pulse      Resp      Temp      Temp src      SpO2      Weight      Height      Head Circumference      Peak Flow      Pain Score 0     Pain Loc      Pain Education      Exclude from Growth Chart    No data found.  Updated Vital Signs BP (!) 169/84 (BP Location: Right Arm)   Pulse 70   Temp 98.7 F (37.1 C) (Oral)   Resp 14   SpO2 94%   Visual Acuity Right Eye Distance:   Left Eye Distance:   Bilateral Distance:    Right Eye Near:   Left Eye Near:    Bilateral Near:     Physical Exam GEN:     alert, non-toxic appearing elderly male in no distress    HENT:  mucus  membranes moist, oropharyngeal without lesions or erythema, no tonsillar hypertrophy or exudates, no nasal discharge,  EYES:   no scleral injection or discharge  RESP:  no increased work of breathing, rhonchi bilaterally CVS:   regular rate and rhythm Skin:   warm and dry    UC Treatments / Results  Labs (all labs ordered are listed, but only abnormal results are displayed) Labs Reviewed - No data to display  EKG   Radiology DG Chest 2 View Result Date: 04/15/2023 CLINICAL DATA:  89 year old male with cough and COVID positive EXAM: CHEST - 2 VIEW COMPARISON:  12/29/2014 FINDINGS: Cardiomediastinal silhouette unchanged in size and contour. No evidence of central vascular congestion. No interlobular septal thickening. Interval placement of Watchman device No pneumothorax or pleural effusion. Coarsened interstitial markings, with no confluent airspace disease. No acute displaced fracture. Degenerative changes of the spine. IMPRESSION: No active cardiopulmonary disease. Electronically Signed   By: Gilmer Mor D.O.   On: 04/15/2023 13:28    Procedures Procedures (including critical care time)  Medications Ordered in UC Medications - No data to display  Initial Impression / Assessment and Plan / UC Course  I have reviewed the triage vital signs and the nursing notes.  Pertinent labs & imaging results that were available during my care of the patient were reviewed by me and considered in my medical decision making (see chart for details).       Pt is a 89 y.o. male who presents for 7 days of respiratory symptoms. Renold is afebrile here without recent antipyretics. Satting well on room air. Overall pt is non-toxic appearing, well hydrated, without respiratory distress. Pulmonary exam is unremarkable.  He was diagnosed as COVID was positive on 04/10/23 and started on Paxlovid.     Pt reports chest congestion with his ongoing cough.  Chest xray personally reviewed by me without focal  pneumonia, pleural effusion, cardiomegaly or pneumothorax.  Patient aware the radiologist has not read his xray and is comfortable with the preliminary read by me. Will review radiologist read when available and call patient if a change in plan is warranted.  Pt agreeable to this plan prior to discharge. Patient started on Azithromycin for possible bronchitis.  Discussed symptomatic treatment. Typical duration of symptoms discussed.   Return and ED precautions given and voiced understanding. Discussed MDM, treatment plan and plan for follow-up with patient who agrees with plan.   Radiologist impression reviewed.   Final Clinical Impressions(s) / UC Diagnoses   Final diagnoses:  COVID-19  Acute cough     Discharge Instructions      Stop by the pharmacy to pick up your prescriptions.  Follow up with your primary care provider or return to the urgent care, if not improving.   Your chest xray did not show evidence of pneumonia though the radiologist has not yet read it. If they find something that I didn't, I will call you.        ED Prescriptions     Medication Sig Dispense Auth. Provider   azithromycin (ZITHROMAX Z-PAK) 250 MG tablet Take 2 tablets on day 1 then 1 tablet daily 6 tablet Celso Granja, DO      PDMP not reviewed this encounter.   Katha Cabal, DO 04/15/23 1448

## 2023-04-15 NOTE — Discharge Instructions (Signed)
 Stop by the pharmacy to pick up your prescriptions.  Follow up with your primary care provider or return to the urgent care, if not improving.   Your chest xray did not show evidence of pneumonia though the radiologist has not yet read it. If they find something that I didn't, I will call you.

## 2023-05-06 ENCOUNTER — Ambulatory Visit: Payer: Self-pay | Admitting: Internal Medicine

## 2023-05-15 ENCOUNTER — Encounter: Payer: Self-pay | Admitting: Internal Medicine
